# Patient Record
Sex: Female | Born: 1955
Health system: Southern US, Community
[De-identification: ages and names within clinical notes are randomized; demographics above are authoritative.]

## PROBLEM LIST (undated history)

## (undated) DIAGNOSIS — I639 Cerebral infarction, unspecified: Secondary | ICD-10-CM

## (undated) DIAGNOSIS — H3320 Serous retinal detachment, unspecified eye: Secondary | ICD-10-CM

## (undated) DIAGNOSIS — D649 Anemia, unspecified: Secondary | ICD-10-CM

## (undated) DIAGNOSIS — M1990 Unspecified osteoarthritis, unspecified site: Secondary | ICD-10-CM

## (undated) DIAGNOSIS — IMO0002 Reserved for concepts with insufficient information to code with codable children: Secondary | ICD-10-CM

## (undated) DIAGNOSIS — M329 Systemic lupus erythematosus, unspecified: Secondary | ICD-10-CM

## (undated) DIAGNOSIS — E0590 Thyrotoxicosis, unspecified without thyrotoxic crisis or storm: Secondary | ICD-10-CM

## (undated) DIAGNOSIS — J383 Other diseases of vocal cords: Secondary | ICD-10-CM

## (undated) DIAGNOSIS — J189 Pneumonia, unspecified organism: Secondary | ICD-10-CM

## (undated) DIAGNOSIS — R42 Dizziness and giddiness: Secondary | ICD-10-CM

## (undated) DIAGNOSIS — H3321 Serous retinal detachment, right eye: Secondary | ICD-10-CM

## (undated) DIAGNOSIS — I10 Essential (primary) hypertension: Secondary | ICD-10-CM

## (undated) DIAGNOSIS — N200 Calculus of kidney: Secondary | ICD-10-CM

## (undated) DIAGNOSIS — H5440 Blindness, one eye, unspecified eye: Secondary | ICD-10-CM

## (undated) DIAGNOSIS — Z87442 Personal history of urinary calculi: Secondary | ICD-10-CM

## (undated) HISTORY — PX: ABDOMINAL HYSTERECTOMY: SHX81

## (undated) HISTORY — DX: Cerebral infarction, unspecified: I63.9

## (undated) HISTORY — PX: KNEE SURGERY: SHX244

## (undated) HISTORY — PX: BREAST LUMPECTOMY: SHX2

## (undated) HISTORY — PX: BREAST EXCISIONAL BIOPSY: SUR124

## (undated) HISTORY — PX: THYROID SURGERY: SHX805

---

## 1997-11-24 ENCOUNTER — Inpatient Hospital Stay (HOSPITAL_COMMUNITY): Admission: EM | Admit: 1997-11-24 | Discharge: 1997-11-25 | Payer: Self-pay | Admitting: Emergency Medicine

## 1997-11-26 ENCOUNTER — Other Ambulatory Visit: Admission: RE | Admit: 1997-11-26 | Discharge: 1997-11-26 | Payer: Self-pay | Admitting: Internal Medicine

## 1997-11-30 ENCOUNTER — Ambulatory Visit (HOSPITAL_COMMUNITY): Admission: RE | Admit: 1997-11-30 | Discharge: 1997-11-30 | Payer: Self-pay | Admitting: Internal Medicine

## 1997-12-03 ENCOUNTER — Encounter: Payer: Self-pay | Admitting: Internal Medicine

## 1997-12-03 ENCOUNTER — Ambulatory Visit (HOSPITAL_COMMUNITY): Admission: RE | Admit: 1997-12-03 | Discharge: 1997-12-03 | Payer: Self-pay | Admitting: Internal Medicine

## 1998-03-20 ENCOUNTER — Emergency Department (HOSPITAL_COMMUNITY): Admission: EM | Admit: 1998-03-20 | Discharge: 1998-03-20 | Payer: Self-pay | Admitting: Emergency Medicine

## 1998-05-22 ENCOUNTER — Inpatient Hospital Stay (HOSPITAL_COMMUNITY): Admission: RE | Admit: 1998-05-22 | Discharge: 1998-05-25 | Payer: Self-pay | Admitting: Obstetrics and Gynecology

## 1998-09-16 ENCOUNTER — Emergency Department (HOSPITAL_COMMUNITY): Admission: EM | Admit: 1998-09-16 | Discharge: 1998-09-16 | Payer: Self-pay | Admitting: Emergency Medicine

## 1999-02-20 ENCOUNTER — Encounter: Payer: Self-pay | Admitting: Internal Medicine

## 1999-02-20 ENCOUNTER — Encounter: Admission: RE | Admit: 1999-02-20 | Discharge: 1999-02-20 | Payer: Self-pay | Admitting: Internal Medicine

## 1999-03-03 ENCOUNTER — Ambulatory Visit (HOSPITAL_COMMUNITY): Admission: RE | Admit: 1999-03-03 | Discharge: 1999-03-03 | Payer: Self-pay | Admitting: Internal Medicine

## 1999-03-03 ENCOUNTER — Encounter (INDEPENDENT_AMBULATORY_CARE_PROVIDER_SITE_OTHER): Payer: Self-pay | Admitting: Specialist

## 1999-03-03 ENCOUNTER — Encounter: Payer: Self-pay | Admitting: Internal Medicine

## 1999-04-03 ENCOUNTER — Encounter (HOSPITAL_BASED_OUTPATIENT_CLINIC_OR_DEPARTMENT_OTHER): Payer: Self-pay | Admitting: General Surgery

## 1999-04-03 ENCOUNTER — Ambulatory Visit: Admission: RE | Admit: 1999-04-03 | Discharge: 1999-04-03 | Payer: Self-pay | Admitting: General Surgery

## 1999-04-10 ENCOUNTER — Encounter: Payer: Self-pay | Admitting: Internal Medicine

## 1999-04-10 ENCOUNTER — Ambulatory Visit (HOSPITAL_COMMUNITY): Admission: RE | Admit: 1999-04-10 | Discharge: 1999-04-10 | Payer: Self-pay | Admitting: Internal Medicine

## 1999-04-29 ENCOUNTER — Ambulatory Visit (HOSPITAL_COMMUNITY): Admission: RE | Admit: 1999-04-29 | Discharge: 1999-04-29 | Payer: Self-pay | Admitting: Orthopedic Surgery

## 1999-05-19 ENCOUNTER — Encounter: Admission: RE | Admit: 1999-05-19 | Discharge: 1999-07-02 | Payer: Self-pay | Admitting: Orthopedic Surgery

## 1999-06-03 ENCOUNTER — Emergency Department (HOSPITAL_COMMUNITY): Admission: EM | Admit: 1999-06-03 | Discharge: 1999-06-03 | Payer: Self-pay | Admitting: Emergency Medicine

## 1999-06-04 ENCOUNTER — Encounter (HOSPITAL_BASED_OUTPATIENT_CLINIC_OR_DEPARTMENT_OTHER): Payer: Self-pay | Admitting: General Surgery

## 1999-06-04 ENCOUNTER — Encounter: Payer: Self-pay | Admitting: Emergency Medicine

## 1999-06-04 ENCOUNTER — Ambulatory Visit (HOSPITAL_COMMUNITY): Admission: RE | Admit: 1999-06-04 | Discharge: 1999-06-04 | Payer: Self-pay | Admitting: General Surgery

## 1999-06-17 ENCOUNTER — Encounter (INDEPENDENT_AMBULATORY_CARE_PROVIDER_SITE_OTHER): Payer: Self-pay | Admitting: *Deleted

## 1999-06-17 ENCOUNTER — Ambulatory Visit (HOSPITAL_COMMUNITY): Admission: RE | Admit: 1999-06-17 | Discharge: 1999-06-18 | Payer: Self-pay | Admitting: General Surgery

## 2002-01-05 DIAGNOSIS — M329 Systemic lupus erythematosus, unspecified: Secondary | ICD-10-CM | POA: Insufficient documentation

## 2007-11-24 ENCOUNTER — Ambulatory Visit: Payer: Self-pay | Admitting: Nurse Practitioner

## 2007-11-24 DIAGNOSIS — M899 Disorder of bone, unspecified: Secondary | ICD-10-CM | POA: Insufficient documentation

## 2007-11-24 DIAGNOSIS — E210 Primary hyperparathyroidism: Secondary | ICD-10-CM | POA: Insufficient documentation

## 2007-11-24 DIAGNOSIS — M25569 Pain in unspecified knee: Secondary | ICD-10-CM | POA: Insufficient documentation

## 2007-11-24 DIAGNOSIS — M949 Disorder of cartilage, unspecified: Secondary | ICD-10-CM

## 2007-11-24 DIAGNOSIS — M8580 Other specified disorders of bone density and structure, unspecified site: Secondary | ICD-10-CM | POA: Insufficient documentation

## 2007-11-25 ENCOUNTER — Ambulatory Visit (HOSPITAL_COMMUNITY): Admission: RE | Admit: 2007-11-25 | Discharge: 2007-11-25 | Payer: Self-pay | Admitting: Nurse Practitioner

## 2007-12-05 ENCOUNTER — Ambulatory Visit: Payer: Self-pay | Admitting: *Deleted

## 2007-12-13 ENCOUNTER — Encounter (INDEPENDENT_AMBULATORY_CARE_PROVIDER_SITE_OTHER): Payer: Self-pay | Admitting: Nurse Practitioner

## 2007-12-19 ENCOUNTER — Ambulatory Visit: Payer: Self-pay | Admitting: Nurse Practitioner

## 2007-12-19 DIAGNOSIS — M1710 Unilateral primary osteoarthritis, unspecified knee: Secondary | ICD-10-CM

## 2007-12-19 DIAGNOSIS — M1711 Unilateral primary osteoarthritis, right knee: Secondary | ICD-10-CM | POA: Insufficient documentation

## 2007-12-20 ENCOUNTER — Encounter (INDEPENDENT_AMBULATORY_CARE_PROVIDER_SITE_OTHER): Payer: Self-pay | Admitting: Nurse Practitioner

## 2007-12-20 LAB — CONVERTED CEMR LAB
ALT: 12 units/L (ref 0–35)
ANA Titer 1: 1:40 {titer} — ABNORMAL HIGH
AST: 16 units/L (ref 0–37)
Albumin: 4.6 g/dL (ref 3.5–5.2)
Alkaline Phosphatase: 237 units/L — ABNORMAL HIGH (ref 39–117)
Anti Nuclear Antibody(ANA): POSITIVE — AB
BUN: 13 mg/dL (ref 6–23)
Basophils Absolute: 0 10*3/uL (ref 0.0–0.1)
Basophils Relative: 0 % (ref 0–1)
CO2: 25 meq/L (ref 19–32)
CRP: 0.1 mg/dL (ref <0.6)
Calcium Ionized: 1.74 mmol/L — ABNORMAL HIGH (ref 1.12–1.32)
Calcium, Total (PTH): 12.2 mg/dL — ABNORMAL HIGH (ref 8.4–10.5)
Calcium: 12.2 mg/dL — ABNORMAL HIGH (ref 8.4–10.5)
Chloride: 106 meq/L (ref 96–112)
Cholesterol: 170 mg/dL (ref 0–200)
Creatinine, Ser: 0.76 mg/dL (ref 0.40–1.20)
Eosinophils Absolute: 0.1 10*3/uL (ref 0.0–0.7)
Eosinophils Relative: 1 % (ref 0–5)
Glucose, Bld: 85 mg/dL (ref 70–99)
HCT: 43.8 % (ref 36.0–46.0)
HDL: 40 mg/dL (ref >39)
Hemoglobin: 14 g/dL (ref 12.0–15.0)
LDL Cholesterol: 111 mg/dL — ABNORMAL HIGH (ref 0–99)
Lymphocytes Relative: 55 % — ABNORMAL HIGH (ref 12–46)
Lymphs Abs: 2 10*3/uL (ref 0.7–4.0)
MCHC: 32 g/dL (ref 30.0–36.0)
MCV: 91.3 fL (ref 78.0–100.0)
Monocytes Absolute: 0.4 10*3/uL (ref 0.1–1.0)
Monocytes Relative: 11 % (ref 3–12)
Neutro Abs: 1.2 10*3/uL — ABNORMAL LOW (ref 1.7–7.7)
Neutrophils Relative %: 33 % — ABNORMAL LOW (ref 43–77)
Platelets: 218 10*3/uL (ref 150–400)
Potassium: 4.4 meq/L (ref 3.5–5.3)
RBC: 4.8 M/uL (ref 3.87–5.11)
RDW: 13.8 % (ref 11.5–15.5)
Rhuematoid fact SerPl-aCnc: <20 intl units/mL (ref 0–20)
Sed Rate: 19 mm/hr (ref 0–22)
Sodium: 138 meq/L (ref 135–145)
TSH: 1.483 microintl units/mL (ref 0.350–4.50)
Total Bilirubin: 0.9 mg/dL (ref 0.3–1.2)
Total CHOL/HDL Ratio: 4.3
Total Protein: 8.8 g/dL — ABNORMAL HIGH (ref 6.0–8.3)
Triglycerides: 96 mg/dL (ref <150)
VLDL: 19 mg/dL (ref 0–40)
WBC: 3.7 10*3/uL — ABNORMAL LOW (ref 4.0–10.5)

## 2007-12-22 ENCOUNTER — Ambulatory Visit (HOSPITAL_COMMUNITY): Admission: RE | Admit: 2007-12-22 | Discharge: 2007-12-22 | Payer: Self-pay | Admitting: Family Medicine

## 2008-01-26 ENCOUNTER — Ambulatory Visit: Payer: Self-pay | Admitting: Nurse Practitioner

## 2008-01-26 DIAGNOSIS — IMO0002 Reserved for concepts with insufficient information to code with codable children: Secondary | ICD-10-CM | POA: Insufficient documentation

## 2008-01-31 ENCOUNTER — Telehealth (INDEPENDENT_AMBULATORY_CARE_PROVIDER_SITE_OTHER): Payer: Self-pay | Admitting: Nurse Practitioner

## 2008-01-31 ENCOUNTER — Telehealth (INDEPENDENT_AMBULATORY_CARE_PROVIDER_SITE_OTHER): Payer: Self-pay | Admitting: *Deleted

## 2008-01-31 ENCOUNTER — Encounter (INDEPENDENT_AMBULATORY_CARE_PROVIDER_SITE_OTHER): Payer: Self-pay | Admitting: Nurse Practitioner

## 2008-01-31 LAB — CONVERTED CEMR LAB
Calcium, Total (PTH): 11.5 mg/dL — ABNORMAL HIGH (ref 8.4–10.5)
PTH: 96.4 pg/mL — ABNORMAL HIGH (ref 14.0–72.0)
Phosphorus: 2.5 mg/dL (ref 2.3–4.6)

## 2008-02-10 ENCOUNTER — Encounter (INDEPENDENT_AMBULATORY_CARE_PROVIDER_SITE_OTHER): Payer: Self-pay | Admitting: Nurse Practitioner

## 2008-02-17 ENCOUNTER — Encounter (INDEPENDENT_AMBULATORY_CARE_PROVIDER_SITE_OTHER): Payer: Self-pay | Admitting: Nurse Practitioner

## 2008-03-01 ENCOUNTER — Encounter (INDEPENDENT_AMBULATORY_CARE_PROVIDER_SITE_OTHER): Payer: Self-pay | Admitting: Nurse Practitioner

## 2008-05-11 ENCOUNTER — Encounter (INDEPENDENT_AMBULATORY_CARE_PROVIDER_SITE_OTHER): Payer: Self-pay | Admitting: Nurse Practitioner

## 2008-07-02 ENCOUNTER — Emergency Department (HOSPITAL_COMMUNITY): Admission: EM | Admit: 2008-07-02 | Discharge: 2008-07-02 | Payer: Self-pay | Admitting: Family Medicine

## 2008-09-28 ENCOUNTER — Emergency Department (HOSPITAL_COMMUNITY): Admission: EM | Admit: 2008-09-28 | Discharge: 2008-09-28 | Payer: Self-pay | Admitting: Emergency Medicine

## 2008-10-07 ENCOUNTER — Emergency Department: Payer: Self-pay | Admitting: Emergency Medicine

## 2008-10-15 ENCOUNTER — Ambulatory Visit: Payer: Self-pay | Admitting: Nurse Practitioner

## 2008-10-16 ENCOUNTER — Ambulatory Visit (HOSPITAL_COMMUNITY): Admission: RE | Admit: 2008-10-16 | Discharge: 2008-10-16 | Payer: Self-pay | Admitting: Internal Medicine

## 2008-10-17 ENCOUNTER — Encounter (INDEPENDENT_AMBULATORY_CARE_PROVIDER_SITE_OTHER): Payer: Self-pay | Admitting: Nurse Practitioner

## 2008-11-27 ENCOUNTER — Telehealth (INDEPENDENT_AMBULATORY_CARE_PROVIDER_SITE_OTHER): Payer: Self-pay | Admitting: Nurse Practitioner

## 2008-11-27 LAB — CONVERTED CEMR LAB
ALT: 16 units/L (ref 0–35)
AST: 19 units/L (ref 0–37)
Albumin: 4.4 g/dL (ref 3.5–5.2)
Alkaline Phosphatase: 220 units/L — ABNORMAL HIGH (ref 39–117)
BUN: 13 mg/dL (ref 6–23)
Basophils Absolute: 0 10*3/uL (ref 0.0–0.1)
Basophils Relative: 0 % (ref 0–1)
CO2: 24 meq/L (ref 19–32)
Calcium Ionized: 1.71 mmol/L — ABNORMAL HIGH (ref 1.12–1.32)
Calcium, Total (PTH): 11.7 mg/dL — ABNORMAL HIGH (ref 8.4–10.5)
Calcium: 11.7 mg/dL — ABNORMAL HIGH (ref 8.4–10.5)
Chloride: 110 meq/L (ref 96–112)
Creatinine, Ser: 0.67 mg/dL (ref 0.40–1.20)
Eosinophils Absolute: 0.1 10*3/uL (ref 0.0–0.7)
Eosinophils Relative: 3 % (ref 0–5)
Glucose, Bld: 83 mg/dL (ref 70–99)
HCT: 40.9 % (ref 36.0–46.0)
Hemoglobin: 12.8 g/dL (ref 12.0–15.0)
Lymphocytes Relative: 48 % — ABNORMAL HIGH (ref 12–46)
Lymphs Abs: 1.5 10*3/uL (ref 0.7–4.0)
MCHC: 31.3 g/dL (ref 30.0–36.0)
MCV: 90.7 fL (ref 78.0–100.0)
Monocytes Absolute: 0.4 10*3/uL (ref 0.1–1.0)
Monocytes Relative: 12 % (ref 3–12)
Neutro Abs: 1.2 10*3/uL — ABNORMAL LOW (ref 1.7–7.7)
Neutrophils Relative %: 37 % — ABNORMAL LOW (ref 43–77)
PTH: 140.4 pg/mL — ABNORMAL HIGH (ref 14.0–72.0)
Phosphorus: 2.1 mg/dL — ABNORMAL LOW (ref 2.3–4.6)
Platelets: 201 10*3/uL (ref 150–400)
Potassium: 4.6 meq/L (ref 3.5–5.3)
RBC: 4.51 M/uL (ref 3.87–5.11)
RDW: 14.3 % (ref 11.5–15.5)
Sed Rate: 6 mm/hr (ref 0–22)
Sodium: 143 meq/L (ref 135–145)
Total Bilirubin: 0.6 mg/dL (ref 0.3–1.2)
Total Protein: 8 g/dL (ref 6.0–8.3)
Vit D, 25-Hydroxy: 15 ng/mL — ABNORMAL LOW (ref 30–89)
WBC: 3.1 10*3/uL — ABNORMAL LOW (ref 4.0–10.5)

## 2008-12-21 ENCOUNTER — Encounter (INDEPENDENT_AMBULATORY_CARE_PROVIDER_SITE_OTHER): Payer: Self-pay | Admitting: Nurse Practitioner

## 2009-01-28 ENCOUNTER — Encounter (INDEPENDENT_AMBULATORY_CARE_PROVIDER_SITE_OTHER): Payer: Self-pay | Admitting: Nurse Practitioner

## 2009-01-29 ENCOUNTER — Telehealth (INDEPENDENT_AMBULATORY_CARE_PROVIDER_SITE_OTHER): Payer: Self-pay | Admitting: Nurse Practitioner

## 2009-03-14 ENCOUNTER — Encounter (INDEPENDENT_AMBULATORY_CARE_PROVIDER_SITE_OTHER): Payer: Self-pay | Admitting: Nurse Practitioner

## 2009-05-20 ENCOUNTER — Encounter (INDEPENDENT_AMBULATORY_CARE_PROVIDER_SITE_OTHER): Payer: Self-pay | Admitting: Nurse Practitioner

## 2009-06-07 ENCOUNTER — Encounter (INDEPENDENT_AMBULATORY_CARE_PROVIDER_SITE_OTHER): Payer: Self-pay | Admitting: Nurse Practitioner

## 2009-08-21 ENCOUNTER — Inpatient Hospital Stay (HOSPITAL_COMMUNITY): Admission: RE | Admit: 2009-08-21 | Discharge: 2009-08-24 | Payer: Medicare HMO | Admitting: Orthopedic Surgery

## 2009-08-21 DIAGNOSIS — I639 Cerebral infarction, unspecified: Secondary | ICD-10-CM

## 2009-09-10 ENCOUNTER — Ambulatory Visit: Payer: Self-pay | Admitting: Internal Medicine

## 2009-09-26 ENCOUNTER — Encounter: Payer: Self-pay | Admitting: Orthopedic Surgery

## 2009-10-05 ENCOUNTER — Encounter: Payer: Self-pay | Admitting: Orthopedic Surgery

## 2009-10-08 ENCOUNTER — Ambulatory Visit: Payer: Self-pay | Admitting: Internal Medicine

## 2010-02-04 NOTE — Letter (Signed)
Summary: [facility]//EVALLUATION  [facility]//EVALLUATION   Imported By: Arta Bruce 04/15/2009 14:12:19  _____________________________________________________________________  External Attachment:    Type:   Image     Comment:   External Document

## 2010-02-04 NOTE — Letter (Signed)
Summary: REQUESTING RECORDS FROM [facility]  REQUESTING RECORDS FROM [facility]   Imported By: Arta Bruce 12/13/2007 09:28:25  _____________________________________________________________________  External Attachment:    Type:   Image     Comment:   External Document

## 2010-02-04 NOTE — Letter (Signed)
Summary: [facility]  [facility]   Imported By: Arta Bruce 06/12/2009 10:13:50  _____________________________________________________________________  External Attachment:    Type:   Image     Comment:   External Document

## 2010-02-04 NOTE — Letter (Signed)
Summary: PATIENT INFORMATION  & HISTROY SHEET  PATIENT INFORMATION  & HISTROY SHEET   Imported By: Arta Bruce 11/30/2007 14:13:40  _____________________________________________________________________  External Attachment:    Type:   Image     Comment:   External Document

## 2010-02-04 NOTE — Progress Notes (Signed)
Summary: Lab addition  ---- Converted from flag ---- ---- 01/30/2008 5:25 PM, Lehman Prom FNP wrote: please see if phosphorous can be added to blood in the lab ------------------------------  done..... [name] I

## 2010-02-04 NOTE — Letter (Signed)
Summary: Work Excuse  [facility]  [address] [place], [place] [address]   Phone: [phone]  Fax: [phone]    Today's Date: October 15, 2008  Name of Patient: Elaine Allen  The above named patient had a medical visit today   Please take this into consideration when reviewing the time away from work  Pt was seen today in office for evaluation of left knee injury sustained during a fall.  She does have a left meniscus tear but is able to perform her job duties.   Sincerely yours,   Lehman Prom FNP [facility]

## 2010-02-04 NOTE — Assessment & Plan Note (Signed)
Summary: Right knee pain   Vital Signs:  Patient Profile:   55 Years Old Female Height:     63.50 inches Weight:      192 pounds BMI:     33.60 BSA:     1.91 Temp:     98.3 degrees F oral Pulse rate:   80 / minute Pulse rhythm:   regular Resp:     20 per minute BP sitting:   120 / 80  (left arm) Cuff size:   regular  Pt. in pain?   yes    Location:   both knees  Vitals Entered By: Levon Hedger (January 26, 2008 9:40 AM)              Is Patient Diabetic? No  Does patient need assistance? Ambulation Normal     Chief Complaint:  MRI results/ both knee pain.  History of Present Illness:  Pt into the office with complaints of bil knee pain. Left knee swollen on yesturday. Right knee pain is getting intense.  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 It does not matter what she does and the pain gets so intense. Recurrent pain and sweeling in the right knee.    Updated Prior Medication List: FELDENE 20 MG CAPS (PIROXICAM) 1 capsule by mouth daily as needed for joints FLEXERIL 10 MG TABS (CYCLOBENZAPRINE HCL) 1 tablet by mouth nightly for muscles  Current Allergies (reviewed today): ! PLAQUENIL    Risk Factors: Tobacco use:  never Drug use:  no Alcohol use:  no Exercise:  no Seatbelt use:  100 % Sun Exposure:  occasionally  Family History Risk Factors:    Family History of MI in females < 92 years old:  yes    Family History of MI in males < 31 years old:  no   Review of Systems  MS      Complains of joint pain.      right knee pain    Physical Exam  General:     alert.   Head:     bald hair Eyes:     glasses Lungs:     normal breath sounds.   Heart:     normal rate and regular rhythm.   Genitalia:     ambulating with a cane Msk:     halux valgus Neurologic:     alert & oriented X3.      Impression & Recommendations:  Problem # 1:  MENISCUS TEAR, RIGHT (ICD-836.2) Will refer to ortho - [facility] Orders: Orthopedic Referral (Ortho)   Problem # 2:  PRIMARY HYPERPARATHYROIDISM (ICD-252.01) need to check parathyroid hormone level Orders: T-Parathyroid Hormone, Intact ([id number])   Problem # 3:  HYPERCALCEMIA (ICD-275.42) noted on last visit pt has a history.  Orders: T-Parathyroid Hormone, Intact [phone])   Complete Medication List: 1)  Feldene 20 Mg Caps (Piroxicam) .Marland Kitchen.. 1 capsule by mouth daily as needed for joints 2)  Flexeril 10 Mg Tabs (Cyclobenzaprine hcl) .Marland Kitchen.. 1 tablet by mouth nightly for muscles   Patient Instructions: 1)  This office will refer you to [facility] Fellows program.  If accepted you will be notified of the time and date of appointment. 2)  You will be notified of labs.

## 2010-02-04 NOTE — Letter (Signed)
Summary: BEHAVIORAL GUIDELINE  BEHAVIORAL GUIDELINE   Imported By: Arta Bruce 11/30/2007 14:15:38  _____________________________________________________________________  External Attachment:    Type:   Image     Comment:   External Document

## 2010-02-04 NOTE — Letter (Signed)
Summary: [facility]//PRIMARY HYPERPARATHYROIDISM  [facility]//PRIMARY HYPERPARATHYROIDISM   Imported By: Arta Bruce 04/03/2009 10:34:07  _____________________________________________________________________  External Attachment:    Type:   Image     Comment:   External Document

## 2010-02-04 NOTE — Letter (Signed)
Summary: [facility]ERRAL/[facility]/APP. DATE & TIME  REFERRAL/[facility]/APP. DATE & TIME   Imported By: Arta Bruce 02/17/2008 15:32:27  _____________________________________________________________________  External Attachment:    Type:   Image     Comment:   External Document

## 2010-02-04 NOTE — Assessment & Plan Note (Signed)
Summary: Left Meniscus Tear   Vital Signs:  Patient profile:   55 year old female Weight:      189.8 pounds BMI:     33.21 BSA:     1.90 Temp:     98.3 degrees F oral Pulse rate:   96 / minute Pulse rhythm:   regular Resp:     20 per minute BP sitting:   125 / 87  (left arm) Cuff size:   regular  Vitals Entered By: Levon Hedger (October 15, 2008 12:14 PM) CC: follow-up visit Lupus....pt fell and tore a ligament in leg knee and went to [facility] Is Patient Diabetic? No Pain Assessment Patient in pain? no       Does patient need assistance? Functional Status Self care Ambulation Normal Comments pt states she was given Ultram for pain   CC:  follow-up visit Lupus....pt fell and tore a ligament in leg knee and went to [facility].  History of Present Illness:  Pt into the office for f/u.  Left knee - started about 1 week ago with pain.  She went to the ER and MRI shows injury to the left (menicus tear)  s/p arthoscopic to left knee in 2007 Recent repair at [facility] for right knee torn meniscus.  Fell at work on 09/28/2008 and at the time she c/o left wrist pain at the time of incident.  [employer] is where pt is employed.  Hyperparathyroidism - known since pt established in ths office but no f/u.    Allergies (verified): 1)  ! Plaquenil  Review of Systems General:  Denies fever. CV:  Denies chest pain or discomfort. Resp:  Denies cough. GI:  Denies abdominal pain, nausea, and vomiting. MS:  Complains of muscle weakness; left knee.  Physical Exam  General:  alert.   Head:  bald head Eyes:  glasses pupils reactive to light.   Mouth:  edentulous Lungs:  normal breath sounds.   Heart:  normal rate and regular rhythm.   Skin:  color normal.   Psych:  poor gait   Knee Exam  Knee Exam:    Left:    Inspection:  Abnormal    Palpation:  Abnormal       Location:  medial collateral    Stability:  stable    Tenderness:  medial  collateral    Swelling:  no    Erythema:  no   Impression & Recommendations:  Problem # 1:  MENISCUS TEAR, LEFT (ICD-836.2) noted on MRI done at [facility]  Problem # 2:  PRIMARY HYPERPARATHYROIDISM (ICD-252.01) will refer to endocrine for assessement and treatment Orders: T-Comprehensive Metabolic Panel ([id number]) T-CBC w/Diff (09811-91478) Endocrinology Referral (Endocrine)  Problem # 3:  UNSPECIFIED BREAST SCREENING (ICD-V76.10) self breast exam placcard given will schedule mammogram Orders: Mammogram (Screening) (Mammo)  Complete Medication List: 1)  Feldene 20 Mg Caps (Piroxicam) .Marland Kitchen.. 1 capsule by mouth daily as needed for joints 2)  Flexeril 10 Mg Tabs (Cyclobenzaprine hcl) .Marland Kitchen.. 1 tablet by mouth nightly for muscles  Other Orders: TLB-Sedimentation Rate (ESR) (85652-[id number]) T-Parathyroid Hormone, Intact w/ Calcium ([id number]) T-Calcium, Ionized ([id number]) T-Vitamin D (25-Hydroxy) (28413-24401) T-Phosphorus (02725-36644)  Patient Instructions: 1)  You will be notified of your lab results. 2)  You will be scheduled for a mammogram 3)  Left knee - medical meniscus tear.  You will need to wear knee support especially while at work.

## 2010-02-04 NOTE — Progress Notes (Signed)
Summary: endocrinology apt date and time  Phone Note Outgoing Call   Details for Reason: patient has appointment with [facility] 12/06/08 at 2pm. Volunteer left vm on 11/15/08 for patient that will need 184.00 payment at time of visit. Also advised of apt time and told to give 24 hour notice if need to cancel or reschedule. 2nd call attempt, left vm to return call, please advise of apt time and date when returns call. Number to [facility] endo is [phone] if patient needs to reschedule or cancel  Initial call taken by: Blondell Reveal RN,  November 27, 2008 3:15 PM  Follow-up for Phone Call        patient returned call, she will not be able to make this appointment secondary to the financial requirements. Advised that I could speak with our referral coordinator at [facility] to see if she can refer to [facility] in [place], if patient can do lower payment plan and can get to [place]. She is agreeable and would like to pursue. Call to [facility], which is where patient was scheduled- Dr Everardo All, advised that patient would need to cancel appointment secondary to financial constraints. Have requested referral coordinator review case for possible referral to [facility] Follow-up by: Blondell Reveal RN,  November 27, 2008 3:53 PM  Additional Follow-up for Phone Call Additional follow up Details #1::        Yes, please refer to [facility]. She very much needs this appt Additional Follow-up by: Lehman Prom FNP,  November 27, 2008 4:04 PM    Additional Follow-up for Phone Call Additional follow up Details #2::    Appt is sched @ [facility] Endocrinology for 03-14-09 with Dr.Springs @ 1:10pm..Lt message for Pt to call back.A New Pt packaged will be mailed to Pt's home.  Additional Follow-up for Phone Call Additional follow up Details #3:: Details for Additional Follow-up Action Taken: noted Additional Follow-up by: Lehman Prom FNP,  January 28, 2009 1:13 PM

## 2010-02-04 NOTE — Assessment & Plan Note (Signed)
Summary: Right knee pain   Vital Signs:  Patient Profile:   55 Years Old Female Height:     63.50 inches Weight:      188 pounds BMI:     32.90 BSA:     1.90 Temp:     978 degrees F oral Pulse rate:   76 / minute Pulse rhythm:   regular Resp:     20 per minute BP sitting:   120 / 80  (left arm) Cuff size:   large  Pt. in pain?   no  Vitals Entered By: Levon Hedger (December 19, 2007 11:46 AM)              Is Patient Diabetic? No  Does patient need assistance? Ambulation Normal     Chief Complaint:  xray results 3 week follow-up.  History of Present Illness:  Pt the office for f/u on right knee. still with pain. x-ray of knee done. Pt is still having some burning on the medial aspect. reports an injury on labor day weekend Pt is already taking flexeril and feldene. She does not take the feldene daily.  (see previous visit for full H&P)  Pt is fasting today for labs Hx of lupus and hyperparathyroidism.  awaiting records from previous provider to review.    Prior Medications Reviewed Using: Medication Bottles  Updated Prior Medication List: FELDENE 20 MG CAPS (PIROXICAM) 1 capsule by mouth daily as needed for joints FLEXERIL 10 MG TABS (CYCLOBENZAPRINE HCL) 1 tablet by mouth nightly for muscles  Current Allergies (reviewed today): ! PLAQUENIL    Risk Factors: Tobacco use:  never Drug use:  no Alcohol use:  no Exercise:  no Seatbelt use:  100 % Sun Exposure:  occasionally  Family History Risk Factors:    Family History of MI in females < 100 years old:  yes    Family History of MI in males < 36 years old:  no   Review of Systems  MS      Complains of joint pain.      right knee   Physical Exam  General:     alert and overweight-appearing.   Head:     normocephalic.   Mouth:     mouth - edentulous Lungs:     normal breath sounds.   Heart:     normal rate and regular rhythm.   Abdomen:     soft, non-tender, and normal bowel  sounds.   Msk:     halux valgus Neurologic:     alert & oriented X3.     Knee Exam  General:    obese.    Knee Exam:    Right:    Inspection:  Abnormal    Palpation:  Abnormal       Location:  medial collateral    Stability:  stable    Tenderness:  medial collateral    Swelling:  diffuse    Erythema:  no    Impression & Recommendations:  Problem # 1:  KNEE PAIN, RIGHT (ICD-719.46) will order MRI X-ray results reviewed with pt Her updated medication list for this problem includes:    Feldene 20 Mg Caps (Piroxicam) .Marland Kitchen... 1 capsule by mouth daily as needed for joints    Flexeril 10 Mg Tabs (Cyclobenzaprine hcl) .Marland Kitchen... 1 tablet by mouth nightly for muscles  Orders: MRI with Contrast (MRI w/Contrast)   Problem # 2:  SLE (ICD-710.0) awaiting previous office notes will give flu vaccine today Her updated medication  list for this problem includes:    Feldene 20 Mg Caps (Piroxicam) .Marland Kitchen... 1 capsule by mouth daily as needed for joints  Orders: T-General Health Panel (CBCD, CMP, TSH) ([address]) T-Sed Rate (Automated) [phone]) T-Rheumatoid Factor [phone]) T-C-Reactive Protein [phone]) T-Antinuclear Antib (ANA) [phone])   Complete Medication List: 1)  Feldene 20 Mg Caps (Piroxicam) .Marland Kitchen.. 1 capsule by mouth daily as needed for joints 2)  Flexeril 10 Mg Tabs (Cyclobenzaprine hcl) .Marland Kitchen.. 1 tablet by mouth nightly for muscles  Other Orders: T-Parathyroid Hormone, Intact w/ Calcium ([id number]) T-Calcium, Ionized ([id number]) T-Lipid Profile ([id number])   Patient Instructions: 1)  You will get labs and will be informed of the results. 2)  You need to take the feldene DAILY. 3)  You will be scheduled for MRI. 4)  Schedule follow up 2 weeks after MRI   ]  X-ray  Procedure date:  11/25/2007  Findings:      right knee - mild to moderate osteoarthritis involving the medial and lateral compartment  Appended Document: Right knee pain         Current Allergies: ! PLAQUENIL        Complete Medication List: 1)  Feldene 20 Mg Caps (Piroxicam) .Marland Kitchen.. 1 capsule by mouth daily as needed for joints 2)  Flexeril 10 Mg Tabs (Cyclobenzaprine hcl) .Marland Kitchen.. 1 tablet by mouth nightly for muscles    ]  Influenza Vaccine    Vaccine Type: Fluvax 3+    Site: right deltoid    Mfr: Sanofi Pasteur    Dose: 0.5 ml    Route: IM    Given by: Levon Hedger    Exp. Date: 06/31/2010    Lot #: [id number]    VIS given: 07/29/06 version given December 19, 2007.  Flu Vaccine Consent Questions    Do you have a history of severe allergic reactions to this vaccine? no    Any prior history of allergic reactions to egg and/or gelatin? no    Do you have a sensitivity to the preservative Thimersol? no    Do you have a past history of Guillan-Barre Syndrome? no    Do you currently have an acute febrile illness? no    Have you ever had a severe reaction to latex? no    Vaccine information given and explained to patient? yes    Are you currently pregnant? no    ndc [phone]

## 2010-02-04 NOTE — Letter (Signed)
Summary: *HSN Results Follow up  [facility]  [address] [place], [place] [address]   Phone: [phone]  Fax: [phone]      01/28/2009   Elaine Allen [address] [place], [place]  [address]   Dear  Elaine Allen,                            ____S.Drinkard,FNP   ____D. Gore,FNP       ____B. McPherson,MD   ____V. Rankins,MD    ____E. Mulberry,MD    __X__N. Daphine Deutscher, FNP  ____D. Reche Dixon, MD    ____K. Philipp Deputy, MD    ____Other     This letter is to inform you that you have been referred to [facility].  Your appointment is March 10th, 2011 at 1:10PM with Dr. Peterson Ao.  Please be sure you keep this appointment to check your parathyroid abnormality.  This office has been trying to get you an appointment with the specialist for a long time so it is very important.  You should be receiving a new patient packet in the mail to complete.  Appt is sched @ [facility] for 03-14-09 with Dr.Springs @ 1:10pm       _________________________________________________________ If you have any questions, please contact our office [phone]                    Sincerely,    Lehman Prom FNP [facility]

## 2010-02-04 NOTE — Letter (Signed)
Summary: KNEE IMMOBILIZATION  KNEE IMMOBILIZATION   Imported By: Arta Bruce 10/17/2008 09:14:21  _____________________________________________________________________  External Attachment:    Type:   Image     Comment:   External Document

## 2010-02-04 NOTE — Miscellaneous (Signed)
Summary: Hx - Right Meniscal repair  Clinical Lists Changes  Problems: Changed problem from MENISCUS TEAR, RIGHT (ICD-836.2) - seen on MRI to History of  MENISCUS TEAR, RIGHT (ICD-836.2) - seen on MRI, repaired at [facility] on 05/08/2008

## 2010-02-04 NOTE — Progress Notes (Signed)
Summary: Unable to add Vitamin D lab  ---- Converted from flag ---- ---- 01/31/2008 9:10 AM, Lehman Prom FNP wrote: see if vitamin D can be added to blood in lab ------------------------------   Phone Note Outgoing Call   Call placed by: [name] CMA,  January 31, 2008 1:16 PM Summary of Call: contacted lab could not add VItamin D to blood work Initial call taken by: [name] CMA,  January 31, 2008 1:16 PM

## 2010-02-04 NOTE — Progress Notes (Signed)
Summary: Endocinology Referral Update  Phone Note From Other Clinic   Caller: Referral Coordinator Summary of Call: Pt aware of appt on  03-14-09 @ 1:10 with Dr.Springs @ [facility]ocrinology Initial call taken by: Candi Leash,  January 29, 2009 12:16 PM  Follow-up for Phone Call        noted Follow-up by: Lehman Prom FNP,  January 29, 2009 12:19 PM

## 2010-02-04 NOTE — Letter (Signed)
Summary: [facility]/APPT DATE & TIME  [facility]/APPT DATE & TIME   Imported By: Arta Bruce 04/03/2008 11:51:22  _____________________________________________________________________  External Attachment:    Type:   Image     Comment:   External Document

## 2010-02-04 NOTE — Letter (Signed)
Summary: MAILED REQUESTED RECORDS TO [facility]  MAILED REQUESTED RECORDS TO [facility]   Imported By: Arta Bruce 06/07/2009 12:57:04  _____________________________________________________________________  External Attachment:    Type:   Image     Comment:   External Document

## 2010-02-04 NOTE — Assessment & Plan Note (Signed)
Summary: NEW - Establish Care   Vital Signs:  Patient Profile:   55 Years Old Female Height:     63.50 inches Weight:      193 pounds BMI:     33.77 BSA:     1.92 Temp:     97.0 degrees F oral Pulse rate:   76 / minute Pulse rhythm:   regular Resp:     20 per minute BP sitting:   120 / 90  (left arm) Cuff size:   regular  Pt. in pain?   yes    Location:   right knee    Intensity:   10  Vitals Entered By: Levon Hedger (November 24, 2007 3:30 PM)  Menstrual History: LMP - Character: partial hysterctomy              Is Patient Diabetic? No  Does patient need assistance? Ambulation Normal   Last PAP Date 2004   Chief Complaint:  new establish/ right knee pain with swelling.  History of Present Illness: Pt into the office to establish care. Pt moved here from [place]. Pt's son was sent [place] Pt has family here so she decided to move back here while her son was away. Pt had routine f/u with PCP, and hematolgist.  Hx - SLE and hyperparathyroidism Reports surgery x 2 for hyperparathyroidism. She was to have a 3rd surgery. States that her calcium level remained elevated and they wanted to do an addtion surgery to remove part of her thyroid. No current thyroid medications  Left knee - laproscopic surgery in 2000.  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   Right  knee - she has episodes when her right "gives away".  She fell this past memorial day and fell to the ground.  Since then she has been experiencing buring to the medial aspect of the knee.  She wears flat shoes.  When going down the stairs the right knee feels like "jelly" she does have a cane that she does not use all the time.  SLE - taking feldene and muscle relaxer for joints.  she is out of the medication.   Allegic reaction to plaquenil. She did use prednisone from time to time but only if platelet level was low. 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  She would like to return to work but is unable to due to pain in right knee.      Updated Prior Medication List: No Medications Current Allergies (reviewed today): ! PLAQUENIL  Past Surgical History:    parathyroid surgery x 2    laproscopic surgery 2000   Family History:    diabetic - mother    hepatic cancer - mother (deceased)    prostate cancer - father (deceased)    brain tumor - uncle paternal     colon cancer - paternal uncles x 2  Social History:    Occupation: CNA    Never Smoked    Alcohol use-no    Drug use-no   Risk Factors:  Tobacco use:  never Drug use:  no Alcohol use:  no Exercise:  no Seatbelt use:  100 % Sun Exposure:  occasionally  Family History Risk Factors:    Family History of MI in females < 9 years old:  yes    Family History of MI in males < 89 years old:  no   Review of Systems  MS      Complains of joint pain.      Right knee pain   Physical Exam  General:     alert and overweight-appearing.   Head:     short hair Eyes:     glasses Ears:     ear piercing(s) noted.   Mouth:     edenulous Lungs:     normal breath sounds.   Heart:     normal rate and regular rhythm.     Knee Exam  General:    average weight.    Skin:    bruising-R:   Knee Exam:    Right:    Inspection:  Abnormal    Palpation:  Abnormal       Location:  medial capsule    Stability:  stable    Tenderness:  medial collateral    Swelling:  diffuse    Erythema:  no    decreased ROM    Impression & Recommendations:  Problem # 1:  KNEE PAIN, RIGHT (ICD-719.46) Will send for x-ray of right knee Orders: Radiology other (Radiology Other)  Her updated medication list for this problem includes:    Feldene 20 Mg Caps (Piroxicam) .Marland Kitchen... 1 capsule by mouth daily as needed for joints    Flexeril 10 Mg Tabs (Cyclobenzaprine hcl) .Marland Kitchen... 1 tablet by mouth nightly for  muscles   Problem # 2:  SLE (ICD-710.0)  Her updated medication list for this problem includes:    Feldene 20 Mg Caps (Piroxicam) .Marland Kitchen... 1 capsule by mouth daily as needed for joints   Problem # 3:  PRIMARY HYPERPARATHYROIDISM (ICD-252.01) will get records from previous provider to review previous treatment.  Problem # 4:  HYPERCALCEMIA (ICD-275.42)  Complete Medication List: 1)  Feldene 20 Mg Caps (Piroxicam) .Marland Kitchen.. 1 capsule by mouth daily as needed for joints 2)  Flexeril 10 Mg Tabs (Cyclobenzaprine hcl) .Marland Kitchen.. 1 tablet by mouth nightly for muscles   Patient Instructions: 1)  Sign a release to get the records from previous providers 2)  [phone] - Festus Aloe  3)  AND 4)  Adrienne Mocha (may need to call back with number so that this office will know where to fax request for records) 5)  I will need to review your old records to see what the next step for follow up is in regards to parathyroid 6)  You will need to get x-rays of right knee. 7)  Follow up  here in 3 weeks.  Hopefully I will have your previous records by then and can see what next follow up will be.   Prescriptions: FLEXERIL 10 MG TABS (CYCLOBENZAPRINE HCL) 1 tablet by mouth nightly for muscles  #30 x 0   Entered and Authorized by:   Lehman Prom FNP   Signed by:   Lehman Prom FNP on 11/24/2007   Method used:   Print then Give to Patient   RxID:   [id number] FELDENE 20 MG CAPS (PIROXICAM) 1 capsule by mouth daily as needed for joints  #30 x 1   Entered and Authorized by:   Lehman Prom FNP   Signed by:   Lehman Prom FNP on 11/24/2007   Method used:   Print then Give to Patient   RxID:   [id number]  ]

## 2010-02-04 NOTE — Letter (Signed)
[facility]   Imported By: Arta Bruce 05/20/2009 15:26:34  _____________________________________________________________________  External Attachment:    Type:   Image     Comment:   External Document

## 2010-02-07 NOTE — Letter (Signed)
Summary: [facility]FERRAL/[facility]  REFERRAL/[facility]   Imported By: Arta Bruce 03/01/2008 10:36:56  _____________________________________________________________________  External Attachment:    Type:   Image     Comment:   External Document

## 2010-03-21 LAB — HEPATIC FUNCTION PANEL
ALT: 16 U/L (ref 0–35)
AST: 19 U/L (ref 0–37)
Albumin: 3.6 g/dL (ref 3.5–5.2)
Alkaline Phosphatase: 152 U/L — ABNORMAL HIGH (ref 39–117)
Bilirubin, Direct: <0.1 mg/dL (ref 0.0–0.3)
Total Bilirubin: 0.6 mg/dL (ref 0.3–1.2)
Total Protein: 7.5 g/dL (ref 6.0–8.3)

## 2010-03-21 LAB — URINALYSIS, ROUTINE W REFLEX MICROSCOPIC
Bilirubin Urine: NEGATIVE
Glucose, UA: NEGATIVE mg/dL
Hgb urine dipstick: NEGATIVE
Ketones, ur: NEGATIVE mg/dL
Nitrite: NEGATIVE
Protein, ur: NEGATIVE mg/dL
Specific Gravity, Urine: 1.018 (ref 1.005–1.030)
Urobilinogen, UA: 1 mg/dL (ref 0.0–1.0)
pH: 6.5 (ref 5.0–8.0)

## 2010-03-21 LAB — COMPREHENSIVE METABOLIC PANEL
ALT: 20 U/L (ref 0–35)
AST: 18 U/L (ref 0–37)
Albumin: 4.1 g/dL (ref 3.5–5.2)
Alkaline Phosphatase: 171 U/L — ABNORMAL HIGH (ref 39–117)
BUN: 7 mg/dL (ref 6–23)
CO2: 31 mEq/L (ref 19–32)
Calcium: 9.5 mg/dL (ref 8.4–10.5)
Chloride: 105 mEq/L (ref 96–112)
Creatinine, Ser: 0.68 mg/dL (ref 0.4–1.2)
GFR calc Af Amer: >60 mL/min (ref >60)
GFR calc non Af Amer: >60 mL/min (ref >60)
Glucose, Bld: 99 mg/dL (ref 70–99)
Potassium: 3.9 mEq/L (ref 3.5–5.1)
Sodium: 140 mEq/L (ref 135–145)
Total Bilirubin: 1.1 mg/dL (ref 0.3–1.2)
Total Protein: 8.1 g/dL (ref 6.0–8.3)

## 2010-03-21 LAB — TYPE AND SCREEN
ABO/RH(D): O POS
Antibody Screen: NEGATIVE

## 2010-03-21 LAB — CBC
HCT: 33.3 % — ABNORMAL LOW (ref 36.0–46.0)
HCT: 33.9 % — ABNORMAL LOW (ref 36.0–46.0)
HCT: 41.5 % (ref 36.0–46.0)
Hemoglobin: 10.8 g/dL — ABNORMAL LOW (ref 12.0–15.0)
Hemoglobin: 11 g/dL — ABNORMAL LOW (ref 12.0–15.0)
Hemoglobin: 13.7 g/dL (ref 12.0–15.0)
MCH: 29 pg (ref 26.0–34.0)
MCH: 29 pg (ref 26.0–34.0)
MCH: 29.5 pg (ref 26.0–34.0)
MCHC: 32.4 g/dL (ref 30.0–36.0)
MCHC: 32.4 g/dL (ref 30.0–36.0)
MCHC: 33 g/dL (ref 30.0–36.0)
MCV: 89.3 fL (ref 78.0–100.0)
MCV: 89.4 fL (ref 78.0–100.0)
MCV: 89.2 fL (ref 78.0–100.0)
Platelets: 206 10*3/uL (ref 150–400)
Platelets: 216 10*3/uL (ref 150–400)
Platelets: 202 10*3/uL (ref 150–400)
RBC: 3.73 MIL/uL — ABNORMAL LOW (ref 3.87–5.11)
RBC: 3.79 MIL/uL — ABNORMAL LOW (ref 3.87–5.11)
RBC: 4.65 MIL/uL (ref 3.87–5.11)
RDW: 13.9 % (ref 11.5–15.5)
RDW: 14 % (ref 11.5–15.5)
RDW: 13.6 % (ref 11.5–15.5)
WBC: 5.9 10*3/uL (ref 4.0–10.5)
WBC: 7.7 10*3/uL (ref 4.0–10.5)
WBC: 2.7 10*3/uL — ABNORMAL LOW (ref 4.0–10.5)

## 2010-03-21 LAB — APTT: aPTT: 32 seconds (ref 24–37)

## 2010-03-21 LAB — PROTIME-INR
INR: 1 (ref 0.00–1.49)
INR: 1.18 (ref 0.00–1.49)
INR: 1.51 — ABNORMAL HIGH (ref 0.00–1.49)
INR: 0.92 (ref 0.00–1.49)
Prothrombin Time: 13.4 seconds (ref 11.6–15.2)
Prothrombin Time: 15.2 seconds (ref 11.6–15.2)
Prothrombin Time: 18.4 seconds — ABNORMAL HIGH (ref 11.6–15.2)
Prothrombin Time: 12.6 seconds (ref 11.6–15.2)

## 2010-03-21 LAB — SURGICAL PCR SCREEN
MRSA, PCR: NEGATIVE
Staphylococcus aureus: POSITIVE — AB

## 2010-03-21 LAB — ABO/RH: ABO/RH(D): O POS

## 2010-04-14 LAB — D-DIMER, QUANTITATIVE (NOT AT ARMC): D-Dimer, Quant: 0.28 ug/mL-FEU (ref 0.00–0.48)

## 2010-05-23 NOTE — Op Note (Signed)
[place]  Patient:    ARNA, LUIS                       MRN: [id number] Proc. Date: 06/17/99 Adm. Date:  [id number] Disc. Date: [id number] Attending:  Sonda Primes CC:         Mardene Celeste. Lurene Shadow, M.D. (2)             Lindell Spar. Chestine Spore, M.D.                           Operative Report  PREOPERATIVE DIAGNOSES:  Right parathyroid adenoma.  Left breast mass.  POSTOPERATIVE DIAGNOSES:  Follicular adenoma of thyroid and no parathyroid adenoma found on neck exploration.  Left breast mass.  PROCEDURE: 1. Right thyroid lobectomy. 2. Excision of parathyroid right lower pole, excision of parathyroid left    lower pole, excision of mediastinal thymic tissue. 3. Excision of left breast mass/lumpectomy.  SURGEON:  Mardene Celeste. Lurene Shadow, M.D.  ASSISTANT:  Marnee Spring. Wiliam Ke, M.D.  SECOND ASSISTANT:  ______ .  ANESTHESIA:  General.  INDICATION:  This patient is a 55 year old woman who presented with relatively mild hypercalcemia, with calcium levels of 10.5 to 11.  She underwent a sestamibi scan which indicated increased and persistent uptake in the right lower pole, consistent with a right lower pole parathyroid adenoma; she also had parathormone levels drawn and she did have an elevated parathormone level, moderately, in the range of 56, and this was consistent with a parathyroid adenoma.  The patient also has a large ______ -sided breast mass located at the 12 oclock axis of the left breast which was not imaged on recent mammogram.  She is brought to the operating room now for parathyroid exploration and for excision of the left breast mass.  DESCRIPTION OF PROCEDURE:  Following the induction of anesthesia, with the patient positioned supinely, head and neck hyperextended, the neck and anterior chest and breast were prepped and draped to be included in the sterile operative field.  I made a transverse collar incision approximately two  fingerbreadths above the sternal notch, deepened this through the skin and subcutaneous tissues across the platysma muscle, raising the superior flaps up to the thyroid cartilage and an inferior flap down to the sternal notch. Strap muscles were divided in the midline.  There were very large external jugular veins which had to be ligated to accomplish this.  Dissection was carried out on the right side, where dissection was carried down to the right lower pole of the thyroid.  On palpation, there was a mass within the right lower pole of the thyroid and the right lower pole of the thyroid was then dissected up and dissection carried out in the region of the tracheoesophageal groove where the recurrent laryngeal nerve was positively identified and protected throughout the course of this dissection.  The right lower pole parathyroid was identified and was somewhat adherent to some thymic tissue. This did not appear to be large or consistent with an adenoma; this was, however, excised by dissecting it free and securing hemostasis between clamps and forwarding this for pathologic evaluation.  Frozen section diagnosis confirmed that there was parathyroid tissue associated with some thymic tissue.  Continuing dissection on this side, we decided then to remove the right lobe of the thyroid, in that there was a mass within the lower pole of the thyroid gland.  The right lower  pole of the thyroid was then fully mobilized, the recurrent laryngeal nerve dissected free and dissection carried up along the recurrent laryngeal nerve, freeing the entire right lobe of the thyroid.  The thyro-thymic vessels were taken between clamps and secured with ties of 2-0 silk.  The upper pole of the thyroid was doubly clamped and secured with 0 silk ties.  Dissection was carried across the trachea up to the thyroid isthmus where the isthmus was serially clamped and suture-ligated with 3-0 Vicryl sutures and the right  lobe of the thyroid forwarded for pathologic evaluation.  The frozen section on this lesion showed it to be consistent with a follicular adenoma.  There was no parathyroid tissue noted within the right lobe of the thyroid.  We continued dissection up towards the upper pole on the right side where a right upper pole parathyroid gland was identified but not disturbed.  Dissection was carried down to the left lower pole where again another parathyroid gland was identified.  This was dissected free and removed and forwarded for pathologic evaluation.  The frozen section diagnosis of this was parathyroid tissue; no adenoma was noted.  The recurrent laryngeal nerve on the left side was positively identified and protected throughout the course of the dissection.  Dissection was carried up towards the upper pole on the left side where again the upper pole parathyroid gland was identified but left undisturbed.  Dissection was again carried down on the right side into the tracheoesophageal groove, into the carotid sheath, without any evidence of any additional parathyroid tissues being identified and down within the mediastinum, there was a tongue of tissue which was consistent with thymic tissue.  This was dissected free and forwarded for pathologic evaluation and it confirmed our gross evaluation of its being thymic tissue.  Palpation within the mediastinum did not reveal any masses within the mediastinum.  We did not visually explore the superior or inferior mediastinum.  All areas of dissection were then checked for hemostasis so we decided that we would terminate the procedure at this point.  Sponge, instrument and sharp counts were verified.  The midline strap muscles were closed with running 3-0 Vicryl sutures, the platysmal muscle reapproximated with interrupted 3-0 Vicryl sutures, skin closed with 5-0 Monocryl suture and then reinforced with Steri-Strips.  Attention then turned to the left  breast where a circumareolar incision was made on the superior border of the nipple, deepened through skin and  subcutaneous tissue, with dissection being carried down to the mass, which measured approximately 6 cm in greatest diameter, and was identified.  It was somewhat soft and rubbery but a well-defined mass.  This was dissected free in its entirety from the left breast and forwarded for pathologic evaluation and pathology is currently pending.  Hemostasis was secured with electrocautery, sponge and instrument counts verified and the breast tissues reapproximated with interrupted 3-0 Vicryl sutures, subcutaneous tissues closed with 3-0 Vicryl sutures and the skin closed with 5-0 Monocryl sutures.  Both the neck and breast wounds were reinforced with Steri-Strips and sterile dressings were applied.  The anesthetic was then reversed and the patient removed from the operating room to the recovery room in stable condition, having tolerated the procedure well. DD:  06/17/99 TD:  06/20/99 Job: [id number]

## 2010-05-23 NOTE — H&P (Signed)
[place]  Patient:    Elaine Allen, Elaine Allen                       MRN: [id number] Adm. Date:  [id number] Attending:  Wende Mott                         History and Physical  CHIEF COMPLAINT:  Painful left knee.  HISTORY OF PRESENT ILLNESS:  This is a 55 year old female who has been followed in the office for internal derangement involving the left knee with recurrent pain, buckling, swelling, and difficulty with ambulation.  The patient is being treated with anti-inflammatory and quad exercises with no apparent improvement.  PAST MEDICAL HISTORY:  Hysterectomy, tubal ligation, left breast biopsy.  MEDICATIONS:  Vioxx, Trental, vitamins, Xanax.  ALLERGIES:  DEMEROL and CODEINE.  HABITS:  None.  FAMILY HISTORY:  Noncontributory.  REVIEW OF SYSTEMS:  No cardiac or respiratory:  No urinary or bowel symptoms. he patient also has history of hyperthyroidism.  PHYSICAL EXAMINATION:  VITAL SIGNS:  Temperature 97.1, pulse 88, respirations 16, blood pressure 100/60. Height 5 feet 4 inches.  Weight 184.  HEENT:  Normocephalic.  Eyes:  Conjunctivae and sclerae are clear.  NECK:  Supple.  CHEST:  Clear.  CARDIAC:  S1 and S2 regular.  EXTREMITIES:  Left knee genu varum.  Positive McMurrays.  Palpable and audible click.  Tender, +2 effusion.  Range of motion is good.  Negative for Lachman and negative pivot shift test.  IMPRESSION:  Internal derangement of left knee. DD:  04/29/99 TD:  04/29/99 Job: [id number]

## 2010-05-23 NOTE — Op Note (Signed)
[place]. [facility]  Patient:    Elaine Allen, Elaine Allen                       MRN: [id number] Proc. Date: 04/29/99 Adm. Date:  [id number] Attending:  Wende Mott                           Operative Report  PREOPERATIVE DIAGNOSIS:  Internal derangement, left knee.  POSTOPERATIVE DIAGNOSES:  Tear of medial meniscus of posterior horn; chondral defect in medial femoral condyle; loose bodies left knee; chronic synovitis, left knee.  OPERATION:  1. Arthroscopic complete synovectomy.  2. Chondroplasty medial femoral condyle.  3. Partial medial meniscectomy.  SURGEON:  Kennieth Rad, M.D.  ANESTHESIA:  General.  DESCRIPTION OF PROCEDURE:  The patient was taken to the operating room after given adequate preoperative medication, given general anesthesia and intubated.  The eft knee was prepped with DuraPrep, and draped in a sterile manner.  Tourniquet was  used for hemostasis.  One-half inch puncture wound made along the anteromedial nd lateral joint line.  Inflow was through the medial suprapatellar pouch area. Inspection of the joint revealed large chondral defect involving the medial femoral condyle, approximately the size of a silver dollar.  Loose fragments were found  posterior aspect of the knee.  Medial meniscal tear about the posterior horn and small fragment anteriorly.  Markedly thickened synovium both medial and lateral  compartment anteriorly lateral compartment.  Articular surface of the femur, as  well as tibia and lateral meniscus were well preserved.  The anterior cruciate as intact.  Complete synovectomy was done, followed by resection of the torn portion of the posterior horn of the medial meniscus with basket forceps, and then chondroplasty followed by rasping and smoothing of the chondral surface. Copious and abundant irrigation for other loose fragments was done.  Further inspection did not reveal any other loose  fragments.  Wound closure was then done with 4-0 nylon followed by 10 cc of 0.5% Marcaine injected into the joint.  Compressive dressing was applied and knee immobilizer applied.  The patient tolerated the procedure well and was taken to the recovery room in stable and satisfactory condition.  The patient will be discharged home on Prevacid 5 mg 1 q.4h. p.r.n. for pain, ice packs, elevation, nonweightbearing on the left side, and to return to the office in one week. DD:  04/29/99 TD:  04/29/99 Job: [id number]

## 2010-08-10 ENCOUNTER — Emergency Department: Payer: Self-pay | Admitting: Emergency Medicine

## 2010-09-17 ENCOUNTER — Emergency Department: Payer: Self-pay | Admitting: Emergency Medicine

## 2012-02-05 ENCOUNTER — Ambulatory Visit: Payer: Self-pay | Admitting: Oncology

## 2012-02-05 LAB — CBC CANCER CENTER
Basophil #: 0 x10 3/mm (ref 0.0–0.1)
Basophil %: 0.5 %
Eosinophil #: 0.1 x10 3/mm (ref 0.0–0.7)
Eosinophil %: 2.4 %
HCT: 39.9 % (ref 35.0–47.0)
HGB: 13.4 g/dL (ref 12.0–16.0)
Lymphocyte #: 1.3 x10 3/mm (ref 1.0–3.6)
Lymphocyte %: 45.5 %
MCH: 29.4 pg (ref 26.0–34.0)
MCHC: 33.6 g/dL (ref 32.0–36.0)
MCV: 88 fL (ref 80–100)
Monocyte #: 0.3 x10 3/mm (ref 0.2–0.9)
Monocyte %: 11.7 %
Neutrophil #: 1.2 x10 3/mm — ABNORMAL LOW (ref 1.4–6.5)
Neutrophil %: 39.9 %
Platelet: 172 x10 3/mm (ref 150–440)
RBC: 4.56 10*6/uL (ref 3.80–5.20)
RDW: 13.8 % (ref 11.5–14.5)
WBC: 3 x10 3/mm — ABNORMAL LOW (ref 3.6–11.0)

## 2012-02-05 LAB — LACTATE DEHYDROGENASE: LDH: 179 U/L (ref 81–246)

## 2012-02-06 ENCOUNTER — Ambulatory Visit: Payer: Self-pay | Admitting: Oncology

## 2012-02-17 LAB — PROT IMMUNOELECTROPHORES(ARMC)

## 2012-05-05 ENCOUNTER — Ambulatory Visit: Payer: Self-pay | Admitting: Oncology

## 2012-06-13 ENCOUNTER — Ambulatory Visit: Payer: Self-pay | Admitting: Oncology

## 2013-01-05 DIAGNOSIS — I639 Cerebral infarction, unspecified: Secondary | ICD-10-CM

## 2013-01-05 HISTORY — DX: Cerebral infarction, unspecified: I63.9

## 2013-05-14 ENCOUNTER — Emergency Department (HOSPITAL_COMMUNITY): Payer: Medicare HMO

## 2013-05-14 ENCOUNTER — Inpatient Hospital Stay (HOSPITAL_COMMUNITY)
Admission: EM | Admit: 2013-05-14 | Discharge: 2013-05-16 | DRG: 064 | Disposition: A | Discharge status: Home / Self Care | Payer: Medicare HMO | Attending: Internal Medicine | Admitting: Internal Medicine

## 2013-05-14 ENCOUNTER — Encounter (HOSPITAL_COMMUNITY): Payer: Self-pay | Admitting: Emergency Medicine

## 2013-05-14 DIAGNOSIS — M899 Disorder of bone, unspecified: Secondary | ICD-10-CM

## 2013-05-14 DIAGNOSIS — H5500 Unspecified nystagmus: Secondary | ICD-10-CM | POA: Diagnosis present

## 2013-05-14 DIAGNOSIS — Z79899 Other long term (current) drug therapy: Secondary | ICD-10-CM

## 2013-05-14 DIAGNOSIS — M25569 Pain in unspecified knee: Secondary | ICD-10-CM

## 2013-05-14 DIAGNOSIS — J209 Acute bronchitis, unspecified: Secondary | ICD-10-CM | POA: Diagnosis present

## 2013-05-14 DIAGNOSIS — Z8673 Personal history of transient ischemic attack (TIA), and cerebral infarction without residual deficits: Secondary | ICD-10-CM | POA: Diagnosis present

## 2013-05-14 DIAGNOSIS — I639 Cerebral infarction, unspecified: Secondary | ICD-10-CM | POA: Diagnosis present

## 2013-05-14 DIAGNOSIS — IMO0002 Reserved for concepts with insufficient information to code with codable children: Secondary | ICD-10-CM

## 2013-05-14 DIAGNOSIS — I679 Cerebrovascular disease, unspecified: Secondary | ICD-10-CM | POA: Diagnosis present

## 2013-05-14 DIAGNOSIS — H3320 Serous retinal detachment, unspecified eye: Secondary | ICD-10-CM | POA: Diagnosis present

## 2013-05-14 DIAGNOSIS — D649 Anemia, unspecified: Secondary | ICD-10-CM | POA: Diagnosis present

## 2013-05-14 DIAGNOSIS — M329 Systemic lupus erythematosus, unspecified: Secondary | ICD-10-CM | POA: Diagnosis present

## 2013-05-14 DIAGNOSIS — M949 Disorder of cartilage, unspecified: Secondary | ICD-10-CM

## 2013-05-14 DIAGNOSIS — E785 Hyperlipidemia, unspecified: Secondary | ICD-10-CM | POA: Diagnosis present

## 2013-05-14 DIAGNOSIS — E210 Primary hyperparathyroidism: Secondary | ICD-10-CM | POA: Diagnosis present

## 2013-05-14 DIAGNOSIS — I7774 Dissection of vertebral artery: Secondary | ICD-10-CM | POA: Diagnosis present

## 2013-05-14 DIAGNOSIS — R42 Dizziness and giddiness: Secondary | ICD-10-CM | POA: Diagnosis present

## 2013-05-14 DIAGNOSIS — M1710 Unilateral primary osteoarthritis, unspecified knee: Secondary | ICD-10-CM

## 2013-05-14 DIAGNOSIS — R279 Unspecified lack of coordination: Secondary | ICD-10-CM | POA: Diagnosis present

## 2013-05-14 DIAGNOSIS — I6350 Cerebral infarction due to unspecified occlusion or stenosis of unspecified cerebral artery: Principal | ICD-10-CM | POA: Diagnosis present

## 2013-05-14 DIAGNOSIS — I10 Essential (primary) hypertension: Secondary | ICD-10-CM | POA: Diagnosis present

## 2013-05-14 HISTORY — DX: Reserved for concepts with insufficient information to code with codable children: IMO0002

## 2013-05-14 HISTORY — DX: Calculus of kidney: N20.0

## 2013-05-14 HISTORY — DX: Systemic lupus erythematosus, unspecified: M32.9

## 2013-05-14 HISTORY — DX: Other diseases of vocal cords: J38.3

## 2013-05-14 HISTORY — DX: Serous retinal detachment, unspecified eye: H33.20

## 2013-05-14 HISTORY — DX: Anemia, unspecified: D64.9

## 2013-05-14 LAB — COMPREHENSIVE METABOLIC PANEL
ALT: 13 U/L (ref 0–35)
AST: 21 U/L (ref 0–37)
Albumin: 3.7 g/dL (ref 3.5–5.2)
Alkaline Phosphatase: 114 U/L (ref 39–117)
BUN: 9 mg/dL (ref 6–23)
CO2: 24 mEq/L (ref 19–32)
Calcium: 9.1 mg/dL (ref 8.4–10.5)
Chloride: 104 mEq/L (ref 96–112)
Creatinine, Ser: 0.68 mg/dL (ref 0.50–1.10)
GFR calc Af Amer: >90 mL/min (ref >90)
GFR calc non Af Amer: >90 mL/min (ref >90)
Glucose, Bld: 107 mg/dL — ABNORMAL HIGH (ref 70–99)
Potassium: 3.6 mEq/L — ABNORMAL LOW (ref 3.7–5.3)
Sodium: 143 mEq/L (ref 137–147)
Total Bilirubin: 0.6 mg/dL (ref 0.3–1.2)
Total Protein: 8.3 g/dL (ref 6.0–8.3)

## 2013-05-14 LAB — PROTIME-INR
INR: 0.98 (ref 0.00–1.49)
INR: 1.02 (ref 0.00–1.49)
Prothrombin Time: 12.8 seconds (ref 11.6–15.2)
Prothrombin Time: 13.2 seconds (ref 11.6–15.2)

## 2013-05-14 LAB — TROPONIN I: Troponin I: <0.3 ng/mL (ref <0.30)

## 2013-05-14 LAB — CBC WITH DIFFERENTIAL/PLATELET
Basophils Absolute: 0 10*3/uL (ref 0.0–0.1)
Basophils Relative: 0 % (ref 0–1)
Eosinophils Absolute: 0 10*3/uL (ref 0.0–0.7)
Eosinophils Relative: 0 % (ref 0–5)
HCT: 41.4 % (ref 36.0–46.0)
Hemoglobin: 13.6 g/dL (ref 12.0–15.0)
Lymphocytes Relative: 29 % (ref 12–46)
Lymphs Abs: 1.1 10*3/uL (ref 0.7–4.0)
MCH: 29.2 pg (ref 26.0–34.0)
MCHC: 32.9 g/dL (ref 30.0–36.0)
MCV: 89 fL (ref 78.0–100.0)
Monocytes Absolute: 0.3 10*3/uL (ref 0.1–1.0)
Monocytes Relative: 7 % (ref 3–12)
Neutro Abs: 2.5 10*3/uL (ref 1.7–7.7)
Neutrophils Relative %: 64 % (ref 43–77)
Platelets: 123 10*3/uL — ABNORMAL LOW (ref 150–400)
RBC: 4.65 MIL/uL (ref 3.87–5.11)
RDW: 13.7 % (ref 11.5–15.5)
WBC: 3.9 10*3/uL — ABNORMAL LOW (ref 4.0–10.5)

## 2013-05-14 MED ORDER — METOCLOPRAMIDE HCL 5 MG/ML IJ SOLN
20.0000 mg | Freq: Once | INTRAVENOUS | Status: AC
  Administered 2013-05-14: 20 mg via INTRAVENOUS
  Filled 2013-05-14: qty 4

## 2013-05-14 MED ORDER — LORAZEPAM 2 MG/ML IJ SOLN
1.0000 mg | Freq: Once | INTRAMUSCULAR | Status: AC
  Administered 2013-05-14: 1 mg via INTRAVENOUS
  Filled 2013-05-14: qty 1

## 2013-05-14 MED ORDER — SODIUM CHLORIDE 0.9 % IV SOLN: 250.0000 mL | INTRAVENOUS | Status: DC | PRN

## 2013-05-14 MED ORDER — SODIUM CHLORIDE 0.9 % IJ SOLN
3.0000 mL | Freq: Two times a day (BID) | INTRAMUSCULAR | Status: DC
  Administered 2013-05-14 – 2013-05-16 (×4): 3 mL via INTRAVENOUS

## 2013-05-14 MED ORDER — MECLIZINE HCL 25 MG PO TABS
25.0000 mg | ORAL_TABLET | Freq: Once | ORAL | Status: AC
  Administered 2013-05-14: 25 mg via ORAL
  Filled 2013-05-14: qty 1

## 2013-05-14 MED ORDER — ASPIRIN 325 MG PO TABS
325.0000 mg | ORAL_TABLET | Freq: Every day | ORAL | Status: DC
  Administered 2013-05-14 – 2013-05-16 (×3): 325 mg via ORAL
  Filled 2013-05-14 (×3): qty 1

## 2013-05-14 MED ORDER — SODIUM CHLORIDE 0.9 % IV SOLN: INTRAVENOUS | Status: DC

## 2013-05-14 MED ORDER — SODIUM CHLORIDE 0.9 % IJ SOLN: 3.0000 mL | INTRAMUSCULAR | Status: DC | PRN

## 2013-05-14 MED ORDER — SODIUM CHLORIDE 0.9 % IV SOLN
1000.0000 mL | INTRAVENOUS | Status: DC
Start: 2013-05-14 — End: 2013-05-14
  Administered 2013-05-14: 1000 mL via INTRAVENOUS

## 2013-05-14 MED ORDER — ONDANSETRON HCL 4 MG/2ML IJ SOLN
4.0000 mg | Freq: Three times a day (TID) | INTRAMUSCULAR | Status: AC | PRN
Start: 2013-05-14 — End: 2013-05-15

## 2013-05-14 MED ORDER — DIPHENHYDRAMINE HCL 50 MG/ML IJ SOLN
50.0000 mg | Freq: Once | INTRAMUSCULAR | Status: AC
  Administered 2013-05-14: 50 mg via INTRAVENOUS
  Filled 2013-05-14: qty 1

## 2013-05-14 MED ORDER — CLOPIDOGREL BISULFATE 75 MG PO TABS
75.0000 mg | ORAL_TABLET | Freq: Every day | ORAL | Status: DC
  Administered 2013-05-14 – 2013-05-16 (×3): 75 mg via ORAL
  Filled 2013-05-14 (×3): qty 1

## 2013-05-14 MED ORDER — ONDANSETRON HCL 4 MG/2ML IJ SOLN
4.0000 mg | Freq: Once | INTRAMUSCULAR | Status: AC
Start: 2013-05-14 — End: 2013-05-14
  Administered 2013-05-14: 4 mg via INTRAVENOUS
  Filled 2013-05-14: qty 2

## 2013-05-14 MED ORDER — SODIUM CHLORIDE 0.9 % IV SOLN
1000.0000 mL | Freq: Once | INTRAVENOUS | Status: AC
  Administered 2013-05-14: 1000 mL via INTRAVENOUS

## 2013-05-14 NOTE — ED Notes (Signed)
Patient returned from CT

## 2013-05-14 NOTE — ED Notes (Signed)
Feeling dizzy and nauseated yesterday; today nauseated and went to bathroom to vomit; got very dizzy and fell down, hit back of head on the wall. Reports room spinning sensation. Given 4mg  Zofran by EMS.

## 2013-05-14 NOTE — ED Notes (Signed)
David, MD at bedside.  

## 2013-05-14 NOTE — ED Notes (Signed)
CT tech had to remove earrings from pt for head scan.  3 earrings from each ear for a total of 6 earrings, removed, and placed into a denture cup, labeled with pt's name and date of birth.

## 2013-05-14 NOTE — ED Notes (Signed)
Patient transported to CT 

## 2013-05-14 NOTE — Consult Note (Signed)
Referring Physician: Tomi Bamberger    Chief Complaint: Dizziness  HPI: Elaine Allen is an 58 y.o. female who noted her symptoms first on Wednesday at 1400 when she noted acute onset vertigo while grocery shopping with her client.  Her symptoms resolved spontaneously and relatively quickly and the patient returned to baseline.  On Friday the patient woke up with a feeling of being hot, nausea, vomiting and vertigo.  She also felt that she was falling to the left.  This lasted the entire day and when she woke up on Saturday her symptoms were worse.  They lasted a couple of hours then resolved completely.  The patient went to bed normal.  When she awakened today the patient again had nausea, vomiting and vertigo.  She lost her balance and fell backward hitting her head.  Her client became concerned and EMS was called.  Patient was brought in for evaluation.  Date last known well: Date: 05/13/2013 Time last known well: Time: 21:30 tPA Given: No: Outside time window  Past Medical History  Diagnosis Date  . Lupus   . Spasmodic dysphonia   . Chronic anemia   . Kidney stones   . Retinal detachment     Past Surgical History  Procedure Laterality Date  . Abdominal hysterectomy    . Thyroid surgery    . Knee surgery Left     Family History  Problem Relation Age of Onset  . Cancer Mother     liver  . Cancer Father     prostate  . Diabetes Mother    Social History:  reports that she has never smoked. She does not have any smokeless tobacco history on file. She reports that she does not drink alcohol or use illicit drugs.  Allergies:  Allergies  Allergen Reactions  . Hydroxychloroquine Sulfate Rash and Other (See Comments)    blisters    Medications: I have reviewed the patient's current medications. Prior to Admission:   Current outpatient prescriptions:OVER THE COUNTER MEDICATION ALLERGY MED, Take 1 tablet by mouth daily as needed (sinuses)., Disp: , Rfl:   ROS: History obtained from  the patient  General ROS: negative for - chills, fatigue, fever, night sweats, weight gain or weight loss Psychological ROS: negative for - behavioral disorder, hallucinations, memory difficulties, mood swings or suicidal ideation Ophthalmic ROS: negative for - blurry vision, double vision, eye pain or loss of vision ENT ROS: as noted in HPI Allergy and Immunology ROS: negative for - hives or itchy/watery eyes Hematological and Lymphatic ROS: negative for - bleeding problems, bruising or swollen lymph nodes Endocrine ROS: negative for - galactorrhea, hair pattern changes, polydipsia/polyuria or temperature intolerance Respiratory ROS: negative for - cough, hemoptysis, shortness of breath or wheezing Cardiovascular ROS: negative for - chest pain, dyspnea on exertion, edema or irregular heartbeat Gastrointestinal ROS: as noted in HPI Genito-Urinary ROS: negative for - dysuria, hematuria, incontinence or urinary frequency/urgency Musculoskeletal ROS: negative for - joint swelling or muscular weakness Neurological ROS: as noted in HPI Dermatological ROS: negative for rash and skin lesion changes  Physical Examination: Blood pressure 173/89, pulse 100, temperature 97.6 F (36.4 C), temperature source Oral, resp. rate 20, height $RemoveBe'5\' 4"'llhdlhbSy$  (1.626 m), weight 81.647 kg (180 lb), SpO2 95.00%.  Neurologic Examination: Mental Status: Alert, oriented, thought content appropriate.  Speech fluent without evidence of aphasia.  Able to follow 3 step commands without difficulty. Cranial Nerves: II: Discs flat bilaterally; Visual fields grossly normal, pupils equal, round, reactive to light and accommodation III,IV,  VI: ptosis not present, extra-ocular motions intact bilaterally with horizontal nystagmus in all quadrants-worse on looking to the left V,VII: smile symmetric, facial light touch sensation normal bilaterally VIII: hearing normal bilaterally IX,X: gag reflex present XI: bilateral shoulder  shrug XII: midline tongue extension Motor: Right : Upper extremity   5/5    Left:     Upper extremity   5-/5  Lower extremity   5/5     Lower extremity   5-/5 Tone and bulk:normal tone throughout; no atrophy noted Sensory: Pinprick and light touch intact throughout, bilaterally Deep Tendon Reflexes: 2+ and symmetric throughout Plantars: Right: downgoing   Left: downgoing Cerebellar: Dysmetria with finger-to-nose testing on the left.  Normal heel-to-shin testing Gait: Unable to test CV: pulses palpable throughout     Laboratory Studies:  Basic Metabolic Panel: No results found for this basename: NA, K, CL, CO2, GLUCOSE, BUN, CREATININE, CALCIUM, MG, PHOS,  in the last 168 hours  Liver Function Tests: No results found for this basename: AST, ALT, ALKPHOS, BILITOT, PROT, ALBUMIN,  in the last 168 hours No results found for this basename: LIPASE, AMYLASE,  in the last 168 hours No results found for this basename: AMMONIA,  in the last 168 hours  CBC: No results found for this basename: WBC, NEUTROABS, HGB, HCT, MCV, PLT,  in the last 168 hours  Cardiac Enzymes: No results found for this basename: CKTOTAL, CKMB, CKMBINDEX, TROPONINI,  in the last 168 hours  BNP: No components found with this basename: POCBNP,   CBG: No results found for this basename: GLUCAP,  in the last 168 hours  Microbiology: Results for orders placed during the hospital encounter of 08/21/09  SURGICAL PCR SCREEN     Status: Abnormal   Collection Time    08/15/09  2:30 PM      Result Value Ref Range Status   MRSA, PCR NEGATIVE  NEGATIVE Final   Staphylococcus aureus   (*) NEGATIVE Final   Value: POSITIVE            The Xpert SA Assay (FDA     approved for NASAL specimens     only), is one component of     a comprehensive surveillance     program.  It is not intended     to diagnose infection nor to     guide or monitor treatment.    Coagulation Studies: No results found for this basename:  LABPROT, INR,  in the last 72 hours  Urinalysis: No results found for this basename: COLORURINE, APPERANCEUR, LABSPEC, PHURINE, GLUCOSEU, HGBUR, BILIRUBINUR, KETONESUR, PROTEINUR, UROBILINOGEN, NITRITE, LEUKOCYTESUR,  in the last 168 hours  Lipid Panel:    Component Value Date/Time   CHOL 170 12/20/2007 0935   TRIG 96 12/20/2007 0935   HDL 40 12/20/2007 0935   CHOLHDL 4.3 Ratio 12/20/2007 0935   VLDL 19 12/20/2007 0935   LDLCALC 111* 12/20/2007 0935    HgbA1C:  No results found for this basename: HGBA1C    Urine Drug Screen:   No results found for this basename: labopia,  cocainscrnur,  labbenz,  amphetmu,  thcu,  labbarb    Alcohol Level: No results found for this basename: ETH,  in the last 168 hours  Other results: EKG: sinus arhythmia at 68 bpm.  Imaging: Ct Head Wo Contrast  05/14/2013   CLINICAL DATA:  Fall. Blunt trauma to back of head. Dizziness and nausea.  EXAM: CT HEAD WITHOUT CONTRAST  TECHNIQUE: Contiguous axial images were obtained from  the base of the skull through the vertex without intravenous contrast.  COMPARISON:  None.  FINDINGS: There is no evidence of intracranial hemorrhage, brain edema, or other signs of acute infarction. There is no evidence of intracranial mass lesion or mass effect. No abnormal extraaxial fluid collections are identified.  Mild central cerebral atrophy is noted. Persistent cavum septum pellucidum and cavum vergae noted. No evidence hydrocephalus. No evidence of skull fracture or other significant bone abnormality.  IMPRESSION: No acute intracranial abnormality.  Mild cerebral atrophy.   Electronically Signed   By: Earle Gell M.D.   On: 05/14/2013 14:26   Mr Jodene Nam Head Wo Contrast  05/14/2013   CLINICAL DATA:  Acute onset of dizziness and severe vertigo with falling. Question stroke.  EXAM: MRI HEAD WITHOUT CONTRAST  MRA HEAD WITHOUT CONTRAST  TECHNIQUE: Multiplanar, multiecho pulse sequences of the brain and surrounding structures were  obtained without intravenous contrast. Angiographic images of the head were obtained using MRA technique without contrast.  COMPARISON:  None.  FINDINGS: MRI HEAD FINDINGS  Multi focal restricted diffusion is evident within the inferior left cerebellum compatible with an acute nonhemorrhagic left PICA territory infarct. No other acute infarct is present.  No hemorrhage or mass lesion is present. T2 changes are present in association with the infarct. Mild periventricular and subcortical white matter changes are noted as well. Incidental note is made of a persistent cavum septum pellucidum et vergae. Mild generalized atrophy is evident. The ventricles are proportionate to the degree of atrophy. No significant extra-axial fluid collection is present.  The left vertebral artery is occluded. Flow is present in the right vertebral artery. Flow is present in the anterior circulation.  Scleral banding is noted at the right globe. The globes are mildly enlarged bilaterally. The orbits are otherwise intact. A fluid level is present in the right maxillary sinus. Mild mucosal thickening is present in the ethmoid air cells bilaterally, right greater than left. A left mastoid effusion is present. No obstructing nasopharyngeal lesion is evident.  MRA HEAD FINDINGS  The left vertebral artery occlusion is confirmed. This may be an acute dissection. The right vertebral artery is normal. The right PICA origin is within normal limits. The basilar artery is unremarkable. Both posterior cerebral arteries originate from the basilar tip.  The internal carotid arteries are within normal limits from the high cervical segments through the ICA termini bilaterally. The A1 and M1 segments are normal. The anterior communicating artery is patent. The MCA bifurcations are within normal limits bilaterally. ACA and MCA branch vessels are unremarkable.  IMPRESSION: 1. Acute nonhemorrhagic left PICA territory infarct. 2. Occluded left vertebral  artery. This likely represents an acute dissection. 3. Bupthalmos.  Status post banding of the right globe. Critical Value/emergent results were called by telephone at the time of interpretation on 05/14/2013 at 5:50 PM to Dr. Rolland Porter , who verbally acknowledged these results.   Electronically Signed   By: Lawrence Santiago M.D.   On: 05/14/2013 18:00   Mr Brain Wo Contrast  05/14/2013   CLINICAL DATA:  Acute onset of dizziness and severe vertigo with falling. Question stroke.  EXAM: MRI HEAD WITHOUT CONTRAST  MRA HEAD WITHOUT CONTRAST  TECHNIQUE: Multiplanar, multiecho pulse sequences of the brain and surrounding structures were obtained without intravenous contrast. Angiographic images of the head were obtained using MRA technique without contrast.  COMPARISON:  None.  FINDINGS: MRI HEAD FINDINGS  Multi focal restricted diffusion is evident within the inferior left cerebellum compatible with  an acute nonhemorrhagic left PICA territory infarct. No other acute infarct is present.  No hemorrhage or mass lesion is present. T2 changes are present in association with the infarct. Mild periventricular and subcortical white matter changes are noted as well. Incidental note is made of a persistent cavum septum pellucidum et vergae. Mild generalized atrophy is evident. The ventricles are proportionate to the degree of atrophy. No significant extra-axial fluid collection is present.  The left vertebral artery is occluded. Flow is present in the right vertebral artery. Flow is present in the anterior circulation.  Scleral banding is noted at the right globe. The globes are mildly enlarged bilaterally. The orbits are otherwise intact. A fluid level is present in the right maxillary sinus. Mild mucosal thickening is present in the ethmoid air cells bilaterally, right greater than left. A left mastoid effusion is present. No obstructing nasopharyngeal lesion is evident.  MRA HEAD FINDINGS  The left vertebral artery occlusion is  confirmed. This may be an acute dissection. The right vertebral artery is normal. The right PICA origin is within normal limits. The basilar artery is unremarkable. Both posterior cerebral arteries originate from the basilar tip.  The internal carotid arteries are within normal limits from the high cervical segments through the ICA termini bilaterally. The A1 and M1 segments are normal. The anterior communicating artery is patent. The MCA bifurcations are within normal limits bilaterally. ACA and MCA branch vessels are unremarkable.  IMPRESSION: 1. Acute nonhemorrhagic left PICA territory infarct. 2. Occluded left vertebral artery. This likely represents an acute dissection. 3. Bupthalmos.  Status post banding of the right globe. Critical Value/emergent results were called by telephone at the time of interpretation on 05/14/2013 at 5:50 PM to Dr. Rolland Porter , who verbally acknowledged these results.   Electronically Signed   By: Lawrence Santiago M.D.   On: 05/14/2013 18:00    Assessment: 58 y.o. female presenting with dizziness that has been intermittent for the past 5 days but persistent since this morning.  MRI of the brain reviewed and shows a left PICA infarct with occlusion of the left vertebral artery.  Occlusion likely secondary to a dissection.  NIHSS of 1 for dysmetria.  Patient on no antiplatelet therapy at home.  No history of trauma.   Stroke Risk Factors - hypercholesterolemia  Plan: 1. HgbA1c, fasting lipid panel, ESR 2. PT consult, OT consult, Speech consult 3. Echocardiogram 4. Carotid dopplers 5. Prophylactic therapy-ASA and Plavix daily.  First dose in ED 6. Risk factor modification.  Statin initiation. 7. Telemetry monitoring 8. Frequent neuro checks  Case discussed with Dr. Mariella Saa, MD [facility] [phone] 05/14/2013, 6:36 PM

## 2013-05-14 NOTE — ED Notes (Signed)
Patient returned from MRI.

## 2013-05-14 NOTE — ED Notes (Signed)
Patient transported to MRI 

## 2013-05-14 NOTE — ED Notes (Signed)
Patient still in MRI.  

## 2013-05-14 NOTE — H&P (Signed)
PCP:   MARTIN,NYKEDTRA, NP   Chief Complaint:  vertigo  HPI: 58 yo female h/o sle, hyperparathyroidism s/p parathyroidectomy comes in with several days of persistent dizziness/vertigo that almost made her pass out today.  She says about 3 days ago she was in the grocery store and she had this sudden onset of vertigo with sharp pain in back of her neck, the vertigo was brief and resolved on its own within several minutes.  Then a day or so later the symptoms returned, not the headache but the vertiginous symptoms.  No other focal neurological deficits.  Her vertigo is improved if she remains lying still.  No n/v.  No rashes.  No fevers.  We are asked to admit for cva shown on mri.  Review of Systems:  Positive and negative as per HPI otherwise all other systems are negative  Past Medical History: Past Medical History  Diagnosis Date  . Lupus   . Spasmodic dysphonia   . Chronic anemia   . Kidney stones   . Retinal detachment    Past Surgical History  Procedure Laterality Date  . Abdominal hysterectomy    . Thyroid surgery    . Knee surgery Left     Medications: Prior to Admission medications   Medication Sig Start Date End Date Taking? Authorizing Provider  OVER THE COUNTER MEDICATION Take 1 tablet by mouth daily as needed (sinuses).   Yes Historical Provider, MD    Allergies:   Allergies  Allergen Reactions  . Hydroxychloroquine Sulfate Rash and Other (See Comments)    blisters    Social History:  reports that she has never smoked. She does not have any smokeless tobacco history on file. She reports that she does not drink alcohol or use illicit drugs.  Family History: Family History  Problem Relation Age of Onset  . Cancer Mother     liver  . Cancer Father     prostate  . Diabetes Mother     Physical Exam: Filed Vitals:   05/14/13 1600 05/14/13 1832 05/14/13 1930 05/14/13 2000  BP: 146/67 173/89 136/83 149/85  Pulse: 94 100 85 89  Temp:      TempSrc:       Resp: 26 20 18 16   Height:      Weight:      SpO2: 96% 95% 98% 99%   General appearance: alert, cooperative and no distress Head: Normocephalic, without obvious abnormality, atraumatic Eyes: negative Nose: Nares normal. Septum midline. Mucosa normal. No drainage or sinus tenderness. Neck: no JVD and supple, symmetrical, trachea midline Lungs: clear to auscultation bilaterally Heart: regular rate and rhythm, S1, S2 normal, no murmur, click, rub or gallop Abdomen: soft, non-tender; bowel sounds normal; no masses,  no organomegaly Extremities: extremities normal, atraumatic, no cyanosis or edema Pulses: 2+ and symmetric Skin: Skin color, texture, turgor normal. No rashes or lesions Neurologic: Grossly normal    Labs on Admission:   Recent Labs  05/14/13 1844  NA 143  K 3.6*  CL 104  CO2 24  GLUCOSE 107*  BUN 9  CREATININE 0.68  CALCIUM 9.1    Recent Labs  05/14/13 1844  AST 21  ALT 13  ALKPHOS 114  BILITOT 0.6  PROT 8.3  ALBUMIN 3.7    Recent Labs  05/14/13 1844  TROPONINI <0.30   Radiological Exams on Admission: Ct Head Wo Contrast  05/14/2013   CLINICAL DATA:  Fall. Blunt trauma to back of head. Dizziness and nausea.  EXAM: CT  HEAD WITHOUT CONTRAST  TECHNIQUE: Contiguous axial images were obtained from the base of the skull through the vertex without intravenous contrast.  COMPARISON:  None.  FINDINGS: There is no evidence of intracranial hemorrhage, brain edema, or other signs of acute infarction. There is no evidence of intracranial mass lesion or mass effect. No abnormal extraaxial fluid collections are identified.  Mild central cerebral atrophy is noted. Persistent cavum septum pellucidum and cavum vergae noted. No evidence hydrocephalus. No evidence of skull fracture or other significant bone abnormality.  IMPRESSION: No acute intracranial abnormality.  Mild cerebral atrophy.   Electronically Signed   By: Myles RosenthalJohn  Stahl M.D.   On: 05/14/2013 14:26   Mr Maxine GlennMra  Head Wo Contrast  05/14/2013   CLINICAL DATA:  Acute onset of dizziness and severe vertigo with falling. Question stroke.  EXAM: MRI HEAD WITHOUT CONTRAST  MRA HEAD WITHOUT CONTRAST  TECHNIQUE: Multiplanar, multiecho pulse sequences of the brain and surrounding structures were obtained without intravenous contrast. Angiographic images of the head were obtained using MRA technique without contrast.  COMPARISON:  None.  FINDINGS: MRI HEAD FINDINGS  Multi focal restricted diffusion is evident within the inferior left cerebellum compatible with an acute nonhemorrhagic left PICA territory infarct. No other acute infarct is present.  No hemorrhage or mass lesion is present. T2 changes are present in association with the infarct. Mild periventricular and subcortical white matter changes are noted as well. Incidental note is made of a persistent cavum septum pellucidum et vergae. Mild generalized atrophy is evident. The ventricles are proportionate to the degree of atrophy. No significant extra-axial fluid collection is present.  The left vertebral artery is occluded. Flow is present in the right vertebral artery. Flow is present in the anterior circulation.  Scleral banding is noted at the right globe. The globes are mildly enlarged bilaterally. The orbits are otherwise intact. A fluid level is present in the right maxillary sinus. Mild mucosal thickening is present in the ethmoid air cells bilaterally, right greater than left. A left mastoid effusion is present. No obstructing nasopharyngeal lesion is evident.  MRA HEAD FINDINGS  The left vertebral artery occlusion is confirmed. This may be an acute dissection. The right vertebral artery is normal. The right PICA origin is within normal limits. The basilar artery is unremarkable. Both posterior cerebral arteries originate from the basilar tip.  The internal carotid arteries are within normal limits from the high cervical segments through the ICA termini bilaterally. The  A1 and M1 segments are normal. The anterior communicating artery is patent. The MCA bifurcations are within normal limits bilaterally. ACA and MCA branch vessels are unremarkable.  IMPRESSION: 1. Acute nonhemorrhagic left PICA territory infarct. 2. Occluded left vertebral artery. This likely represents an acute dissection. 3. Bupthalmos.  Status post banding of the right globe. Critical Value/emergent results were called by telephone at the time of interpretation on 05/14/2013 at 5:50 PM to Dr. Devoria AlbeIVA KNAPP , who verbally acknowledged these results.   Electronically Signed   By: Gennette Pachris  Mattern M.D.   On: 05/14/2013 18:00   Mr Brain Wo Contrast  05/14/2013   CLINICAL DATA:  Acute onset of dizziness and severe vertigo with falling. Question stroke.  EXAM: MRI HEAD WITHOUT CONTRAST  MRA HEAD WITHOUT CONTRAST  TECHNIQUE: Multiplanar, multiecho pulse sequences of the brain and surrounding structures were obtained without intravenous contrast. Angiographic images of the head were obtained using MRA technique without contrast.  COMPARISON:  None.  FINDINGS: MRI HEAD FINDINGS  Multi focal  restricted diffusion is evident within the inferior left cerebellum compatible with an acute nonhemorrhagic left PICA territory infarct. No other acute infarct is present.  No hemorrhage or mass lesion is present. T2 changes are present in association with the infarct. Mild periventricular and subcortical white matter changes are noted as well. Incidental note is made of a persistent cavum septum pellucidum et vergae. Mild generalized atrophy is evident. The ventricles are proportionate to the degree of atrophy. No significant extra-axial fluid collection is present.  The left vertebral artery is occluded. Flow is present in the right vertebral artery. Flow is present in the anterior circulation.  Scleral banding is noted at the right globe. The globes are mildly enlarged bilaterally. The orbits are otherwise intact. A fluid level is  present in the right maxillary sinus. Mild mucosal thickening is present in the ethmoid air cells bilaterally, right greater than left. A left mastoid effusion is present. No obstructing nasopharyngeal lesion is evident.  MRA HEAD FINDINGS  The left vertebral artery occlusion is confirmed. This may be an acute dissection. The right vertebral artery is normal. The right PICA origin is within normal limits. The basilar artery is unremarkable. Both posterior cerebral arteries originate from the basilar tip.  The internal carotid arteries are within normal limits from the high cervical segments through the ICA termini bilaterally. The A1 and M1 segments are normal. The anterior communicating artery is patent. The MCA bifurcations are within normal limits bilaterally. ACA and MCA branch vessels are unremarkable.  IMPRESSION: 1. Acute nonhemorrhagic left PICA territory infarct. 2. Occluded left vertebral artery. This likely represents an acute dissection. 3. Bupthalmos.  Status post banding of the right globe. Critical Value/emergent results were called by telephone at the time of interpretation on 05/14/2013 at 5:50 PM to Dr. Devoria Albe , who verbally acknowledged these results.   Electronically Signed   By: Gennette Pac M.D.   On: 05/14/2013 18:00    Assessment/Plan  58 yo female with acute cva/vertebral artery dissection  Principal Problem:   CVA (cerebral infarction)-  cva pathway with full w/u.  Aspirin/plavix per neuro team.  Neurology team following.    Active Problems:  Stable unless o/w noted   Primary hyperparathyroidism   SLE- she says is very mild case, not on chronic meds for this   Vertebral artery dissection   Vertigo due to cerebrovascular disease   Chronic anemia   Cerebellar stroke  Admit to tele.  ekg nsr no arrythmias.  Full code.  Adamariz Gillott A Onalee Hua 05/14/2013, 8:22 PM

## 2013-05-14 NOTE — ED Provider Notes (Signed)
CSN: [id number]     Arrival date & time 05/14/13  1200 History   First MD Initiated Contact with Patient 05/14/13 1204     Chief Complaint  Patient presents with  . Dizziness     (Consider location/radiation/quality/duration/timing/severity/associated sxs/prior Treatment) HPI Patient reports yesterday she started having a feeling of spinning sensation when she stood up and having a feeling she's falling. 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 She states she did lose her balance and fell back hitting the back of her head. She denies loss of consciousness. She states she's had severe nausea and has had vomiting multiple times about 4-5. She states opening her eyes are standing up makes the dizziness worse. She states she has a headache now however she did not have one until she fell and hit her head. She denies any neck pain. She states she keeps feeling like she is falling to her left. She states she's never had this before.  PCP none  Past Medical History  Diagnosis Date  . Lupus   . Spasmodic dysphonia   . Chronic anemia   . Kidney stones   . Retinal detachment    Past Surgical History  Procedure Laterality Date  . Abdominal hysterectomy    . Thyroid surgery    . Knee surgery Left    Family History  Problem Relation Age of Onset  . Cancer Mother     liver  . Cancer Father     prostate  . Diabetes Mother    History  Substance Use Topics  . Smoking status: Never Smoker   . Smokeless tobacco: Not on file  . Alcohol Use: No   Employed as a Software engineer  OB History   Grav Para Term Preterm Abortions TAB SAB Ect Mult Living                 Review of Systems  All other systems reviewed and are negative.     Allergies  Hydroxychloroquine sulfate  Home Medications   Prior to Admission medications   Not on File    BP 147/76  Pulse 63  Temp(Src) 97.6 F (36.4 C) (Oral)  Resp 16  Ht 5\' 4"  (1.626 m)  Wt 180 lb (81.647 kg)  BMI 30.88 kg/m2  SpO2 99%  Vital signs normal   Physical Exam  Nursing note and vitals reviewed. Constitutional: She is oriented to person, place, and time. She appears well-developed and well-nourished.  Non-toxic appearance. She does not appear ill. She appears distressed.  HENT:  Head: Normocephalic and atraumatic.  Right Ear: External ear normal.  Left Ear: External ear normal.  Nose: Nose normal. No mucosal edema or rhinorrhea.  Mouth/Throat: Mucous membranes are normal. No dental abscesses or uvula swelling. Oropharyngeal exudate present.  Dry tongue  Eyes: Conjunctivae and EOM are normal. Pupils are equal, round, and reactive to light.  Keeps her eyes closed, when she opens them she has marked horizontal nystagymus  Neck: Normal range of motion and full passive range of motion without pain. Neck supple.  Cardiovascular: Normal rate, regular rhythm and normal heart sounds.  Exam reveals no gallop and no friction rub.   No murmur heard. Pulmonary/Chest: Effort normal and breath sounds normal. No respiratory distress. She has no wheezes. She has no rhonchi. She has no  rales. She exhibits no tenderness and no crepitus.  Breathing hard, but denies SOB  Abdominal: Soft. Normal appearance and bowel sounds are normal. She exhibits no distension. There is no tenderness. There is no rebound and no guarding.  Musculoskeletal: Normal range of motion. She exhibits no edema and no tenderness.  Moves all extremities well.   Neurological: She is alert and oriented to person, place, and time. She has normal strength. No cranial nerve deficit.  Skin: Skin is warm, dry and intact. No rash noted. No erythema. No pallor.  Psychiatric: Her speech is normal and behavior is normal. Her affect is blunt.    ED Course  Procedures (including critical care time)  Medications  0.9 %   sodium chloride infusion (0 mLs Intravenous Stopped 05/14/13 1330)    Followed by  0.9 %  sodium chloride infusion (1,000 mLs Intravenous New Bag/Given 05/14/13 1237)  aspirin tablet 325 mg (325 mg Oral Given 05/14/13 1831)  clopidogrel (PLAVIX) tablet 75 mg (75 mg Oral Given 05/14/13 1831)  metoCLOPramide (REGLAN) 20 mg in dextrose 5 % 50 mL IVPB (20 mg Intravenous Given 05/14/13 1338)  diphenhydrAMINE (BENADRYL) injection 50 mg (50 mg Intravenous Given 05/14/13 1231)  LORazepam (ATIVAN) injection 1 mg (1 mg Intravenous Given 05/14/13 1231)  meclizine (ANTIVERT) tablet 25 mg (25 mg Oral Given 05/14/13 1228)  meclizine (ANTIVERT) tablet 25 mg (25 mg Oral Given 05/14/13 1529)  LORazepam (ATIVAN) injection 1 mg (1 mg Intravenous Given 05/14/13 1529)  ondansetron (ZOFRAN) injection 4 mg (4 mg Intravenous Given 05/14/13 1825)   Patient continued to have severe vertigo-type symptoms. Her medications were repeated. After her CT came back normal we discussed possible stroke and need for MRI. Patient is agreeable.  17:22 Dr Thad Rangereynolds, will look at MRI/MRA when available, if has a stroke can be admitted to medicine.   17:48 Dr Alfredo BattyMattern, states she has PICA infarcts in her left cerebellum with possible left vertebral artery dissection.   Dr Thad Rangereynolds has seen patient and talked to her daughters, wants medicine to admit.   19:51 Dr Onalee Huaavid admit to tele, team 10   Labs Review Results for orders placed during the hospital encounter of 05/14/13  COMPREHENSIVE METABOLIC PANEL      Result Value Ref Range   Sodium 143  137 - 147 mEq/L   Potassium 3.6 (*) 3.7 - 5.3 mEq/L   Chloride 104  96 - 112 mEq/L   CO2 24  19 - 32 mEq/L   Glucose, Bld 107 (*) 70 - 99 mg/dL   BUN 9  6 - 23 mg/dL   Creatinine, Ser 1.610.68  0.50 - 1.10 mg/dL   Calcium 9.1  8.4 - 09.610.5 mg/dL   Total Protein 8.3  6.0 - 8.3 g/dL   Albumin 3.7  3.5 - 5.2 g/dL   AST 21  0 - 37 U/L   ALT 13  0 - 35 U/L   Alkaline Phosphatase 114  39 - 117 U/L    Total Bilirubin 0.6  0.3 - 1.2 mg/dL   GFR calc non Af Amer >90  >90 mL/min   GFR calc Af Amer >90  >90 mL/min  TROPONIN I      Result Value Ref Range   Troponin I <0.30  <0.30 ng/mL  PROTIME-INR      Result Value Ref Range   Prothrombin Time 12.8  11.6 - 15.2 seconds   INR 0.98  0.00 - 1.49  CBC WITH DIFFERENTIAL      Result  Value Ref Range   WBC 3.9 (*) 4.0 - 10.5 K/uL   RBC 4.65  3.87 - 5.11 MIL/uL   Hemoglobin 13.6  12.0 - 15.0 g/dL   HCT 16.141.4  09.636.0 - 04.546.0 %   MCV 89.0  78.0 - 100.0 fL   MCH 29.2  26.0 - 34.0 pg   MCHC 32.9  30.0 - 36.0 g/dL   RDW 40.913.7  81.111.5 - 91.415.5 %   Platelets 123 (*) 150 - 400 K/uL   Neutrophils Relative % 64  43 - 77 %   Neutro Abs 2.5  1.7 - 7.7 K/uL   Lymphocytes Relative 29  12 - 46 %   Lymphs Abs 1.1  0.7 - 4.0 K/uL   Monocytes Relative 7  3 - 12 %   Monocytes Absolute 0.3  0.1 - 1.0 K/uL   Eosinophils Relative 0  0 - 5 %   Eosinophils Absolute 0.0  0.0 - 0.7 K/uL   Basophils Relative 0  0 - 1 %   Basophils Absolute 0.0  0.0 - 0.1 K/uL   Laboratory interpretation all normal except mild hypokalemia, low total white blood cell count without neutropenia, mild thrombocytopenia     Imaging Review Ct Head Wo Contrast  05/14/2013   CLINICAL DATA:  Fall. Blunt trauma to back of head. Dizziness and nausea.  EXAM: CT HEAD WITHOUT CONTRAST  TECHNIQUE: Contiguous axial images were obtained from the base of the skull through the vertex without intravenous contrast.  COMPARISON:  None.  FINDINGS: There is no evidence of intracranial hemorrhage, brain edema, or other signs of acute infarction. There is no evidence of intracranial mass lesion or mass effect. No abnormal extraaxial fluid collections are identified.  Mild central cerebral atrophy is noted. Persistent cavum septum pellucidum and cavum vergae noted. No evidence hydrocephalus. No evidence of skull fracture or other significant bone abnormality.  IMPRESSION: No acute intracranial abnormality.  Mild  cerebral atrophy.   Electronically Signed   By: Myles RosenthalJohn  Stahl M.D.   On: 05/14/2013 14:26   Mr Maxine GlennMra Head Wo Contrast Mr Brain Wo Contrast  05/14/2013   CLINICAL DATA:  Acute onset of dizziness and severe vertigo with falling. Question stroke.  EXAM: MRI HEAD WITHOUT CONTRAST  MRA HEAD WITHOUT CONTRAST  TECHNIQUE: Multiplanar, multiecho pulse sequences of the brain and surrounding structures were obtained without intravenous contrast. Angiographic images of the head were obtained using MRA technique without contrast.  COMPARISON:  None.  FINDINGS: MRI HEAD FINDINGS  Multi focal restricted diffusion is evident within the inferior left cerebellum compatible with an acute nonhemorrhagic left PICA territory infarct. No other acute infarct is present.  No hemorrhage or mass lesion is present. T2 changes are present in association with the infarct. Mild periventricular and subcortical white matter changes are noted as well. Incidental note is made of a persistent cavum septum pellucidum et vergae. Mild generalized atrophy is evident. The ventricles are proportionate to the degree of atrophy. No significant extra-axial fluid collection is present.  The left vertebral artery is occluded. Flow is present in the right vertebral artery. Flow is present in the anterior circulation.  Scleral banding is noted at the right globe. The globes are mildly enlarged bilaterally. The orbits are otherwise intact. A fluid level is present in the right maxillary sinus. Mild mucosal thickening is present in the ethmoid air cells bilaterally, right greater than left. A left mastoid effusion is present. No obstructing nasopharyngeal lesion is evident.  MRA HEAD FINDINGS  The left vertebral artery occlusion is confirmed. This may be an acute dissection. The right vertebral artery is normal. The right PICA origin is within normal limits. The basilar artery is unremarkable. Both posterior cerebral arteries originate from the basilar tip.  The  internal carotid arteries are within normal limits from the high cervical segments through the ICA termini bilaterally. The A1 and M1 segments are normal. The anterior communicating artery is patent. The MCA bifurcations are within normal limits bilaterally. ACA and MCA branch vessels are unremarkable.  IMPRESSION: 1. Acute nonhemorrhagic left PICA territory infarct. 2. Occluded left vertebral artery. This likely represents an acute dissection. 3. Bupthalmos.  Status post banding of the right globe. Critical Value/emergent results were called by telephone at the time of interpretation on 05/14/2013 at 5:50 PM to Dr. Devoria Albe , who verbally acknowledged these results.   Electronically Signed   By: Gennette Pac M.D.   On: 05/14/2013 18:00     EKG Interpretation   Date/Time:  Sunday May 14 2013 12:20:06 EDT Ventricular Rate:  68 PR Interval:  139 QRS Duration: 82 QT Interval:  446 QTC Calculation: 474 R Axis:   86 Text Interpretation:  Sinus arrhythmia Since last tracing 03 Apr 1999  Premature atrial complexes Rightward axis Right atrial enlargement  Consider left ventricular hypertrophy Borderline prolonged QT interval  Baseline wander Confirmed by Hao Dion  MD-I, Ximena Todaro ([id number]) on 05/14/2013  12:25:37 PM      MDM   Final diagnoses:  Cerebellar stroke    Plan admission   Devoria Albe, MD, Franz Dell, MD 05/14/13 [phone]

## 2013-05-15 DIAGNOSIS — I6350 Cerebral infarction due to unspecified occlusion or stenosis of unspecified cerebral artery: Secondary | ICD-10-CM

## 2013-05-15 DIAGNOSIS — I519 Heart disease, unspecified: Secondary | ICD-10-CM

## 2013-05-15 DIAGNOSIS — M899 Disorder of bone, unspecified: Secondary | ICD-10-CM

## 2013-05-15 DIAGNOSIS — E785 Hyperlipidemia, unspecified: Secondary | ICD-10-CM | POA: Diagnosis present

## 2013-05-15 DIAGNOSIS — M949 Disorder of cartilage, unspecified: Secondary | ICD-10-CM

## 2013-05-15 DIAGNOSIS — I10 Essential (primary) hypertension: Secondary | ICD-10-CM | POA: Diagnosis present

## 2013-05-15 LAB — LIPID PANEL
Cholesterol: 167 mg/dL (ref 0–200)
HDL: 31 mg/dL — ABNORMAL LOW (ref >39)
LDL Cholesterol: 120 mg/dL — ABNORMAL HIGH (ref 0–99)
Total CHOL/HDL Ratio: 5.4 RATIO
Triglycerides: 80 mg/dL (ref <150)
VLDL: 16 mg/dL (ref 0–40)

## 2013-05-15 LAB — HEMOGLOBIN A1C
Hgb A1c MFr Bld: 6.3 % — ABNORMAL HIGH (ref <5.7)
Mean Plasma Glucose: 134 mg/dL — ABNORMAL HIGH (ref <117)

## 2013-05-15 MED ORDER — MECLIZINE HCL 25 MG PO TABS
25.0000 mg | ORAL_TABLET | Freq: Three times a day (TID) | ORAL | Status: DC | PRN
  Filled 2013-05-15: qty 1

## 2013-05-15 MED ORDER — SIMVASTATIN 10 MG PO TABS
10.0000 mg | ORAL_TABLET | Freq: Every day | ORAL | Status: DC
  Administered 2013-05-15: 10 mg via ORAL
  Filled 2013-05-15 (×2): qty 1

## 2013-05-15 NOTE — Evaluation (Addendum)
Physical Therapy Evaluation Patient Details Name: Elaine Allen MRN: [id number] DOB: April 20, 1955 Today's Date: 05/15/2013   History of Present Illness  Pt admitted with vertigo, dizziness and fall with noted Left PICA infarct and L vertebral artery dissection  Clinical Impression  Pt very pleasant and without dizziness or vertigo during mobility. Did not assess mobility with head turns given dissection and await further clearance for increased neck ROM. Pt educated for deficits and need for assist with all mobility presently due to impaired balance and gait. Pt will benefit from acute therapy to maximize balance, gait, independence and function to decrease burden of care and return pt to PLOF.     Follow Up Recommendations CIR    Equipment Recommendations  None recommended by PT    Recommendations for Other Services Rehab consult;OT consult     Precautions / Restrictions Precautions Precautions: Fall      Mobility  Bed Mobility Overal bed mobility: Modified Independent                Transfers Overall transfer level: Needs assistance   Transfers: Sit to/from Stand Sit to Stand: Min assist         General transfer comment: pt with posterior left lean on initial standing  Ambulation/Gait Ambulation/Gait assistance: Min assist Ambulation Distance (Feet): 40 Feet Assistive device: 1 person hand held assist Gait Pattern/deviations: Step-through pattern;Decreased stride length;Drifts right/left;Narrow base of support   Gait velocity interpretation: <1.8 ft/sec, indicative of risk for recurrent falls General Gait Details: pt with left lean and drifting left with unsteady gait and LOB with ambulation  Stairs            Wheelchair Mobility    Modified Rankin (Stroke Patients Only) Modified Rankin (Stroke Patients Only) Pre-Morbid Rankin Score: No symptoms Modified Rankin: Moderately severe disability     Balance Overall balance assessment: Needs  assistance   Sitting balance-Leahy Scale: Good       Standing balance-Leahy Scale: Poor Standing balance comment: pt with left lean in standing with min assist to maintain balance and prevent fall Single Leg Stance - Right Leg: 2 Single Leg Stance - Left Leg: 2     Rhomberg - Eyes Opened: 5                   Pertinent Vitals/Pain No pain HR 87    Home Living Family/patient expects to be discharged to:: Private residence Living Arrangements: Alone Available Help at Discharge: Family;Available PRN/intermittently Type of Home: House Home Access: Stairs to enter   Entrance Stairs-Number of Steps: 2 Home Layout: One level Home Equipment: Walker - 2 wheels      Prior Function Level of Independence: Independent         Comments: Pt works as a Psychologist, clinicalHHRN     Hand Dominance        Extremity/Trunk Assessment   Upper Extremity Assessment: LUE deficits/detail       LUE Deficits / Details: pt with dysmetria with finger to nose but able to perform RAM supination/pronation and finger to thumb without difficulty.    Lower Extremity Assessment: Overall WFL for tasks assessed      Cervical / Trunk Assessment: Normal  Communication   Communication: No difficulties  Cognition Arousal/Alertness: Awake/alert Behavior During Therapy: WFL for tasks assessed/performed Overall Cognitive Status: Impaired/Different from baseline Area of Impairment: Safety/judgement         Safety/Judgement: Decreased awareness of deficits     General Comments: Pt is a HHRN but  was unable to states signs/symptoms of a stroke    General Comments      Exercises        Assessment/Plan    PT Assessment Patient needs continued PT services  PT Diagnosis Abnormality of gait   PT Problem List Decreased balance;Decreased coordination  PT Treatment Interventions Gait training;DME instruction;Functional mobility training;Therapeutic activities;Patient/family education;Neuromuscular  re-education;Balance training   PT Goals (Current goals can be found in the Care Plan section) Acute Rehab PT Goals Patient Stated Goal: be able to return to work PT Goal Formulation: With patient Time For Goal Achievement: 05/29/13 Potential to Achieve Goals: Good    Frequency Min 4X/week   Barriers to discharge Decreased caregiver support      Co-evaluation               End of Session Equipment Utilized During Treatment: Gait belt Activity Tolerance: Patient tolerated treatment well Patient left: in chair;with call bell/phone within reach;with chair alarm set Nurse Communication: Mobility status         Time: 1610-96041309-1329 PT Time Calculation (min): 20 min   Charges:   PT Evaluation $Initial PT Evaluation Tier I: 1 Procedure PT Treatments $Therapeutic Activity: 8-22 mins   PT G Codes:          Elaine Allen 05/15/2013, 1:42 PM Elaine Allen Elaine Allen, PT [phone]

## 2013-05-15 NOTE — Evaluation (Signed)
Occupational Therapy Evaluation Patient Details Name: Elaine MaltaJill L Ewings MRN: [id number] DOB: November 08, 1955 Today's Date: 05/15/2013    History of Present Illness Pt admitted with vertigo, dizziness and fall with noted Left PICA infarct and L vertebral artery dissection   Clinical Impression   Pt admitted with above. Pt presents with below problem list. Pt independent with ADLs, PTA. Feel pt will benefit from acute OT to increase independence prior to d/c.     Follow Up Recommendations  CIR    Equipment Recommendations  Other (comment) (defer to next venue)    Recommendations for Other Services Rehab consult     Precautions / Restrictions Precautions Precautions: Fall      Mobility Bed Mobility Overal bed mobility: Modified Independent                Transfers Overall transfer level: Needs assistance   Transfers: Sit to/from Stand Sit to Stand: Min guard         General transfer comment: Min guard for safety, Cues for technique    Balance                                            ADL Overall ADL's : Needs assistance/impaired     Grooming: Wash/dry hands;Wash/dry face;Oral care;Set up;Supervision/safety;Standing   Upper Body Bathing: Set up;Sitting   Lower Body Bathing: Min guard;Sit to/from stand   Upper Body Dressing : Set up;Sitting   Lower Body Dressing: Min guard;Sit to/from stand   Toilet Transfer: Minimal assistance/Moderate assistance (Min-Mod A for balance without walker);Ambulation;Regular Toilet;Grab bars   Toileting- Clothing Manipulation and Hygiene: Min guard (standing)       Functional mobility during ADLs: Minimal assistance/Moderate assistance;Min guard;Rolling walker (Min A/Mod A without walker-handheld and decreased balance; Min guard with walker) General ADL Comments: Educated on signs/symptoms of stroke and importance of getting help right away. Educated on safe shoe wear. Pt performed grooming at sink and also  toileting tasks. Educated on activities pt can be doing for coordination in left hand. Ambulated in hallway with walker. Pt more steady with walker.      Vision       Tracking/Visual Pursuits: Other (comment) (decreased smoothness; lost pen on left side)             Perception     Praxis      Pertinent Vitals/Pain Headache at beginning of session. No pain at end of session.       Hand Dominance     Extremity/Trunk Assessment Upper Extremity Assessment Upper Extremity Assessment: RUE deficits/detail;LUE deficits/detail RUE Deficits / Details: strength WFL but did not achieve full AROM shoulder flexion without assist-reports previous neck pain on right side but has now resolved. LUE Sensation: decreased light touch LUE Coordination: decreased fine motor;decreased gross motor   Lower Extremity Assessment Lower Extremity Assessment: Defer to PT evaluation       Communication Communication Communication: No difficulties   Cognition Arousal/Alertness: Awake/alert Behavior During Therapy: WFL for tasks assessed/performed Overall Cognitive Status: Within Functional Limits for tasks assessed                     General Comments       Exercises       Shoulder Instructions      Home Living Family/patient expects to be discharged to:: Private residence Living Arrangements: Alone Available Help at Discharge:  Family;Available PRN/intermittently Type of Home: House Home Access: Stairs to enter Entergy CorporationEntrance Stairs-Number of Steps: 2   Home Layout: One level     Bathroom Shower/Tub: Chief Strategy OfficerTub/shower unit   Bathroom Toilet:  (has both)     Home Equipment: Environmental consultantWalker - 2 wheels;Grab bars - tub/shower          Prior Functioning/Environment Level of Independence: Independent        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    OT Diagnosis: Acute pain;Disturbance of vision (decreased balance)   OT Problem List: Decreased strength;Impaired balance (sitting and/or  standing);Decreased coordination;Impaired vision/perception;Decreased knowledge of use of DME or AE;Decreased knowledge of precautions;Pain   OT Treatment/Interventions: Self-care/ADL training;DME and/or AE instruction;Therapeutic activities;Visual/perceptual remediation/compensation;Patient/family education;Balance training;Neuromuscular education;Therapeutic exercise    OT Goals(Current goals can be found in the care plan section) Acute Rehab OT Goals Patient Stated Goal: not stated OT Goal Formulation: With patient Time For Goal Achievement: 05/22/13 Potential to Achieve Goals: Good ADL Goals Pt Will Perform Lower Body Bathing: with modified independence;sit to/from stand Pt Will Perform Lower Body Dressing: with modified independence;sit to/from stand Pt Will Transfer to Toilet: with modified independence;ambulating Pt Will Perform Toileting - Clothing Manipulation and hygiene: with modified independence;sit to/from stand  OT Frequency: Min 2X/week   Barriers to D/C: Decreased caregiver support          Co-evaluation              End of Session Equipment Utilized During Treatment: Gait belt;Rolling walker  Activity Tolerance: Patient tolerated treatment well Patient left: in chair;with call bell/phone within reach;with chair alarm set   Time: [id number] OT Time Calculation (min): 24 min Charges:  OT General Charges $OT Visit: 1 Procedure OT Evaluation $Initial OT Evaluation Tier I: 1 Procedure OT Treatments $Therapeutic Activity: 8-22 mins G-CodesEarlie Raveling:    Madine Sarr L Areal Cochrane OTR/L [phone] 05/15/2013, 6:13 PM

## 2013-05-15 NOTE — Progress Notes (Signed)
Patient ID: Elaine Allen  female  ZOX:[id number]    DOB: 1955/08/03    DOA: 05/14/2013  PCP: Aurora Mask, NP  Assessment/Plan: Principal Problem:   Acute CVA (cerebral infarction) with Vertebral artery dissection - MRI of the brain shows a left PICA infarct with occlusion of the left vertebral artery. Occlusion likely secondary to a dissection. - Placed on aspirin and Plavix, neurology consulted, and follow stroke team recommendations - 2-D echo and carotid Dopplers pending - LDL 120, placed on statins - PT, OT evaluation pending - ESR normal  Active Problems:   SLE    Vertigo due to cerebrovascular disease - Continue meclizine as needed    Chronic anemia- stable    Essential hypertension, benign - Not on any antihypertensives, permissive HTN in acute CVA    Other and unspecified hyperlipidemia - Placed on statins  DVT Prophylaxis: SCDs  Code Status: Full code  Family Communication:  Disposition:  Consultants: Neurology Procedures:  None  Antibiotics:  None    Subjective: Still complaining of some headache, no nausea, vomiting or dizziness this morning, gait not assessed  Objective: Weight change:   Intake/Output Summary (Last 24 hours) at 05/15/13 0918 Last data filed at 05/15/13 0848  Gross per 24 hour  Intake    120 ml  Output      0 ml  Net    120 ml   Blood pressure 164/99, pulse 115, temperature 99 F (37.2 C), temperature source Oral, resp. rate 16, height $RemoveBe'5\' 4"'JxmjeDFRM$  (1.626 m), weight 85.866 kg (189 lb 4.8 oz), SpO2 100.00%.  Physical Exam: General: Alert and awake, oriented x3, not in any acute distress. CVS: S1-S2 clear, no murmur rubs or gallops Chest: clear to auscultation bilaterally, no wheezing, rales or rhonchi Abdomen: soft nontender, nondistended, normal bowel sounds  Extremities: no cyanosis, clubbing or edema noted bilaterally Neuro: Cranial nerves II-XII intact, no dysarthria, Dysmetria with finger-to-nose testing on the  left. Normal heel-to-shin testing, strength 5/5 in right in left upper and lower extremities   Lab Results: Basic Metabolic Panel:  Recent Labs Lab 05/14/13 1844  NA 143  K 3.6*  CL 104  CO2 24  GLUCOSE 107*  BUN 9  CREATININE 0.68  CALCIUM 9.1   Liver Function Tests:  Recent Labs Lab 05/14/13 1844  AST 21  ALT 13  ALKPHOS 114  BILITOT 0.6  PROT 8.3  ALBUMIN 3.7   No results found for this basename: LIPASE, AMYLASE,  in the last 168 hours No results found for this basename: AMMONIA,  in the last 168 hours CBC:  Recent Labs Lab 05/14/13 1913  WBC 3.9*  NEUTROABS 2.5  HGB 13.6  HCT 41.4  MCV 89.0  PLT 123*   Cardiac Enzymes:  Recent Labs Lab 05/14/13 1844  TROPONINI <0.30   BNP: No components found with this basename: POCBNP,  CBG: No results found for this basename: GLUCAP,  in the last 168 hours   Micro Results: No results found for this or any previous visit (from the past 240 hour(s)).  Studies/Results: Ct Head Wo Contrast  05/14/2013   CLINICAL DATA:  Fall. Blunt trauma to back of head. Dizziness and nausea.  EXAM: CT HEAD WITHOUT CONTRAST  TECHNIQUE: Contiguous axial images were obtained from the base of the skull through the vertex without intravenous contrast.  COMPARISON:  None.  FINDINGS: There is no evidence of intracranial hemorrhage, brain edema, or other signs of acute infarction. There is no evidence of intracranial mass lesion or  mass effect. No abnormal extraaxial fluid collections are identified.  Mild central cerebral atrophy is noted. Persistent cavum septum pellucidum and cavum vergae noted. No evidence hydrocephalus. No evidence of skull fracture or other significant bone abnormality.  IMPRESSION: No acute intracranial abnormality.  Mild cerebral atrophy.   Electronically Signed   By: Earle Gell M.D.   On: 05/14/2013 14:26   Mr Jodene Nam Head Wo Contrast  05/14/2013   CLINICAL DATA:  Acute onset of dizziness and severe vertigo with  falling. Question stroke.  EXAM: MRI HEAD WITHOUT CONTRAST  MRA HEAD WITHOUT CONTRAST  TECHNIQUE: Multiplanar, multiecho pulse sequences of the brain and surrounding structures were obtained without intravenous contrast. Angiographic images of the head were obtained using MRA technique without contrast.  COMPARISON:  None.  FINDINGS: MRI HEAD FINDINGS  Multi focal restricted diffusion is evident within the inferior left cerebellum compatible with an acute nonhemorrhagic left PICA territory infarct. No other acute infarct is present.  No hemorrhage or mass lesion is present. T2 changes are present in association with the infarct. Mild periventricular and subcortical white matter changes are noted as well. Incidental note is made of a persistent cavum septum pellucidum et vergae. Mild generalized atrophy is evident. The ventricles are proportionate to the degree of atrophy. No significant extra-axial fluid collection is present.  The left vertebral artery is occluded. Flow is present in the right vertebral artery. Flow is present in the anterior circulation.  Scleral banding is noted at the right globe. The globes are mildly enlarged bilaterally. The orbits are otherwise intact. A fluid level is present in the right maxillary sinus. Mild mucosal thickening is present in the ethmoid air cells bilaterally, right greater than left. A left mastoid effusion is present. No obstructing nasopharyngeal lesion is evident.  MRA HEAD FINDINGS  The left vertebral artery occlusion is confirmed. This may be an acute dissection. The right vertebral artery is normal. The right PICA origin is within normal limits. The basilar artery is unremarkable. Both posterior cerebral arteries originate from the basilar tip.  The internal carotid arteries are within normal limits from the high cervical segments through the ICA termini bilaterally. The A1 and M1 segments are normal. The anterior communicating artery is patent. The MCA bifurcations  are within normal limits bilaterally. ACA and MCA branch vessels are unremarkable.  IMPRESSION: 1. Acute nonhemorrhagic left PICA territory infarct. 2. Occluded left vertebral artery. This likely represents an acute dissection. 3. Bupthalmos.  Status post banding of the right globe. Critical Value/emergent results were called by telephone at the time of interpretation on 05/14/2013 at 5:50 PM to Dr. Rolland Porter , who verbally acknowledged these results.   Electronically Signed   By: Lawrence Santiago M.D.   On: 05/14/2013 18:00   Mr Brain Wo Contrast  05/14/2013   CLINICAL DATA:  Acute onset of dizziness and severe vertigo with falling. Question stroke.  EXAM: MRI HEAD WITHOUT CONTRAST  MRA HEAD WITHOUT CONTRAST  TECHNIQUE: Multiplanar, multiecho pulse sequences of the brain and surrounding structures were obtained without intravenous contrast. Angiographic images of the head were obtained using MRA technique without contrast.  COMPARISON:  None.  FINDINGS: MRI HEAD FINDINGS  Multi focal restricted diffusion is evident within the inferior left cerebellum compatible with an acute nonhemorrhagic left PICA territory infarct. No other acute infarct is present.  No hemorrhage or mass lesion is present. T2 changes are present in association with the infarct. Mild periventricular and subcortical white matter changes are noted as well.  Incidental note is made of a persistent cavum septum pellucidum et vergae. Mild generalized atrophy is evident. The ventricles are proportionate to the degree of atrophy. No significant extra-axial fluid collection is present.  The left vertebral artery is occluded. Flow is present in the right vertebral artery. Flow is present in the anterior circulation.  Scleral banding is noted at the right globe. The globes are mildly enlarged bilaterally. The orbits are otherwise intact. A fluid level is present in the right maxillary sinus. Mild mucosal thickening is present in the ethmoid air cells  bilaterally, right greater than left. A left mastoid effusion is present. No obstructing nasopharyngeal lesion is evident.  MRA HEAD FINDINGS  The left vertebral artery occlusion is confirmed. This may be an acute dissection. The right vertebral artery is normal. The right PICA origin is within normal limits. The basilar artery is unremarkable. Both posterior cerebral arteries originate from the basilar tip.  The internal carotid arteries are within normal limits from the high cervical segments through the ICA termini bilaterally. The A1 and M1 segments are normal. The anterior communicating artery is patent. The MCA bifurcations are within normal limits bilaterally. ACA and MCA branch vessels are unremarkable.  IMPRESSION: 1. Acute nonhemorrhagic left PICA territory infarct. 2. Occluded left vertebral artery. This likely represents an acute dissection. 3. Bupthalmos.  Status post banding of the right globe. Critical Value/emergent results were called by telephone at the time of interpretation on 05/14/2013 at 5:50 PM to Dr. Rolland Porter , who verbally acknowledged these results.   Electronically Signed   By: Lawrence Santiago M.D.   On: 05/14/2013 18:00    Medications: Scheduled Meds: . aspirin  325 mg Oral Daily  . clopidogrel  75 mg Oral Q breakfast  . simvastatin  10 mg Oral q1800  . sodium chloride  3 mL Intravenous Q12H      LOS: 1 day   Rafiq Bucklin Krystal Eaton M.D. [facility] 05/15/2013, 9:18 AM Pager: [phone]  If 7PM-7AM, please contact night-coverage [email or web address] Password [email or web address]  **Disclaimer: This note was dictated with voice recognition software. Similar sounding words can inadvertently be transcribed and this note may contain transcription errors which may not have been corrected upon publication of note.**

## 2013-05-15 NOTE — Progress Notes (Signed)
Rehab Admissions Coordinator Note:  Patient was screened by Trish MageEugenia M Sanari Offner for appropriateness for an Inpatient Acute Rehab Consult.  At this time, we are recommending Inpatient Rehab consult. The consult has already been ordered and is pending.  Ellouise Newerugenia M Lashawnta Burgert 05/15/2013, 3:47 PM  I can be reached at [phone].

## 2013-05-15 NOTE — Progress Notes (Signed)
Utilization review completed.  

## 2013-05-15 NOTE — Progress Notes (Signed)
  Echocardiogram 2D Echocardiogram has been performed.  Arvil ChacoRachel Foster 05/15/2013, 3:05 PM

## 2013-05-15 NOTE — Progress Notes (Signed)
VASCULAR LAB PRELIMINARY  PRELIMINARY  PRELIMINARY  PRELIMINARY  Carotid duplex completed.    Preliminary report: 1-39% stenosis noted bilaterally.  Right vertebral artery flow antegrade.  Lt vertebral artery flow antegrade with spiked waveform suggestive of distal vertebral artery occlusion.   Elaine ScotlandFrances G Myleen Brailsford, RVT 05/15/2013, 2:57 PM

## 2013-05-15 NOTE — Progress Notes (Addendum)
Stroke Team Progress Note  HISTORY Elaine Allen is a 58 y.o. female who noted her symptoms first on Wednesday at 1400 when she had acute onset vertigo while grocery shopping with her client. Her symptoms resolved spontaneously and relatively quickly and the patient returned to baseline. On Friday the patient woke up with a feeling of being hot, nausea, vomiting and vertigo. She also felt that she was falling to the left. This lasted the entire day and when she woke up on Saturday her symptoms were worse. They lasted a couple of hours then resolved completely. The patient went to bed normal. When she awakened 05/14/2013 the patient again had nausea, vomiting and vertigo. She lost her balance and fell backward hitting her head. Her client became concerned and EMS was called. Patient was brought in for evaluation.   Date last known well: Date: 05/13/2013  Time last known well: Time: 21:30  tPA Given: No: Outside time window   SUBJECTIVE N members present this morning. The patient voices no new complaints.  OBJECTIVE Most recent Vital Signs: Filed Vitals:   05/15/13 0000 05/15/13 0400 05/15/13 0600 05/15/13 0700  BP: 141/73 138/73 147/84 164/99  Pulse: 70 73 69 115  Temp:  99 F (37.2 C)    TempSrc:      Resp:  16 16   Height:      Weight:      SpO2: 93% 99% 96% 100%   CBG (last 3)  No results found for this basename: GLUCAP,  in the last 72 hours  IV Fluid Intake:     MEDICATIONS  . aspirin  325 mg Oral Daily  . clopidogrel  75 mg Oral Q breakfast  . sodium chloride  3 mL Intravenous Q12H   PRN:  sodium chloride, ondansetron (ZOFRAN) IV, sodium chloride  Diet:  Dysphagia 1 diet with thin liquids Activity:   Up with assistance DVT Prophylaxis:  SCDs  CLINICALLY SIGNIFICANT STUDIES Basic Metabolic Panel:  Recent Labs Lab 05/14/13 1844  NA 143  K 3.6*  CL 104  CO2 24  GLUCOSE 107*  BUN 9  CREATININE 0.68  CALCIUM 9.1   Liver Function Tests:  Recent Labs Lab  05/14/13 1844  AST 21  ALT 13  ALKPHOS 114  BILITOT 0.6  PROT 8.3  ALBUMIN 3.7   CBC:  Recent Labs Lab 05/14/13 1913  WBC 3.9*  NEUTROABS 2.5  HGB 13.6  HCT 41.4  MCV 89.0  PLT 123*   Coagulation:  Recent Labs Lab 05/14/13 1844 05/14/13 2007  LABPROT 12.8 13.2  INR 0.98 1.02   Cardiac Enzymes:  Recent Labs Lab 05/14/13 1844  TROPONINI <0.30   Urinalysis: No results found for this basename: COLORURINE, APPERANCEUR, LABSPEC, PHURINE, GLUCOSEU, HGBUR, BILIRUBINUR, KETONESUR, PROTEINUR, UROBILINOGEN, NITRITE, LEUKOCYTESUR,  in the last 168 hours Lipid Panel    Component Value Date/Time   CHOL 167 05/15/2013 0350   TRIG 80 05/15/2013 0350   HDL 31* 05/15/2013 0350   CHOLHDL 5.4 05/15/2013 0350   VLDL 16 05/15/2013 0350   LDLCALC 120* 05/15/2013 0350   HgbA1C  No results found for this basename: HGBA1C    Urine Drug Screen:   No results found for this basename: labopia, cocainscrnur, labbenz, amphetmu, thcu, labbarb    Alcohol Level: No results found for this basename: ETH,  in the last 168 hours  Ct Head Wo Contrast 05/14/2013    No acute intracranial abnormality.  Mild cerebral atrophy.    [facility] Wo  Contrast 05/14/2013    1. Acute nonhemorrhagic left PICA territory infarct. 2. Occluded left vertebral artery. This likely represents an acute dissection. 3. Bupthalmos.  Status post banding of the right globe.     2D Echocardiogram  pending  Carotid Doppler Preliminary report: 1-39% stenosis noted bilaterally. Right vertebral artery flow antegrade. Lt vertebral artery flow antegrade with spiked waveform suggestive of distal vertebral artery occlusion.   CXR    EKG  sinus arrhythmia rate 68 beats per minute. For complete results please see formal report.   Therapy Recommendations - inpatient rehabilitation recommended  Physical Exam    Mental Status:  Alert, oriented, thought content appropriate. Speech fluent without evidence of aphasia. Able to  follow 3 step commands without difficulty.  Cranial Nerves:  II: Discs flat bilaterally; Visual fields grossly normal, pupils equal, round, reactive to light and accommodation .saccadic dysmetria on lateral gaze III,IV, VI: ptosis not present, extra-ocular motions intact bilaterally with horizontal nystagmus in all quadrants-worse on looking to the left  V,VII: smile symmetric, facial light touch sensation normal bilaterally  VIII: hearing normal bilaterally  IX,X: gag reflex present  XI: bilateral shoulder shrug  XII: midline tongue extension  Motor:  Right : Upper extremity 5/5 Left: Upper extremity 5-/5  Lower extremity 5/5 Lower extremity 5-/5  Tone and bulk:normal tone throughout; no atrophy noted  Sensory: Pinprick and light touch intact throughout, bilaterally  Deep Tendon Reflexes: 2+ and symmetric throughout  Plantars:  Right: downgoing Left: downgoing  Cerebellar:  Dysmetria with finger-to-nose testing on the left. Normal heel-to-shin testing  Gait: Unable to test   ASSESSMENT Elaine Allen is a 58 y.o. female presenting with vertigo and balance problems. TPA was not initiated secondary to late presentation. MRI  - acute nonhemorrhagic left PICA territory infarct. 2. Occluded left vertebral artery. This likely represents an acute dissection. Infarct felt to be thromboembolic secondary to dissection. On no antithrombotics prior to admission. Now on aspirin 325 mg orally every day and clopidogrel 75 mg orally every day for secondary stroke prevention. Patient with resultant balance problems and difficulty controlling left upper extremity. Stroke work up underway.   Lupus  Chronic anemia  Hyperlipidemia - cholesterol 167 LDL 120    Hospital day # 1  TREATMENT/PLAN  Continue aspirin 325 mg orally every day and clopidogrel 75 mg orally every day for secondary stroke prevention.  Inpatient rehabilitation recommended  Await 2-D echo   Zocor added for elevated  LDL     Delton Seeavid Rinehuls PA-C [facility] Pager [phone] 05/15/2013, 8:21 AM  I have personally obtained a history, examined the patient, evaluated imaging results, and formulated the assessment and plan of care. I agree with the above. Delia HeadyPramod Jeris Roser, MD   To contact Stroke Continuity provider, please refer to [email or web address]. After hours, contact General Neurology

## 2013-05-15 NOTE — Evaluation (Signed)
Speech Language Pathology Evaluation Patient Details Name: Elaine Allen MRN: [id number] DOB: [birth year] Today's Date: 05/15/2013 Time: 1139-1200 SLP Time Calculation (min): 21 min  Problem List:  Patient Active Problem List   Diagnosis Date Noted  . Essential hypertension, benign 05/15/2013  . Other and unspecified hyperlipidemia 05/15/2013  . CVA (cerebral infarction) 05/14/2013  . Vertebral artery dissection 05/14/2013  . Vertigo due to cerebrovascular disease 05/14/2013  . Cerebellar stroke 05/14/2013  . Chronic anemia   . Other tear of cartilage or meniscus of knee, current 01/26/2008  . OSTEOARTHRITIS, KNEE, RIGHT, SEVERE 12/19/2007  . Primary hyperparathyroidism 11/24/2007  . HYPERCALCEMIA 11/24/2007  . KNEE PAIN, RIGHT 11/24/2007  . OSTEOPENIA 11/24/2007  . SLE 01/05/2002   Past Medical History:  Past Medical History  Diagnosis Date  . Lupus   . Spasmodic dysphonia   . Chronic anemia   . Kidney stones   . Retinal detachment    Past Surgical History:  Past Surgical History  Procedure Laterality Date  . Abdominal hysterectomy    . Thyroid surgery    . Knee surgery Left    HPI:  58 year old female admitted with several days of dizziness. Diagnosed with acute nonhemorrhagic left PICA territory infarct. PMH of smasmodic dysphonia.    Assessment / Plan / Recommendation Clinical Impression  Cognitive-linguistic evaluation complete. Patient presents with impairements in the areas of high level problem solving/reasoning. Overall, cognition functional for carry out of basic daily activities. Receptive to moderate clinician cueing for improvements overall. Patient will benefit from acute f/u to monitor for improvements as well as likely f/u after d/c. Recommend CIR consult.     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)   Cognition  Overall Cognitive Status: Impaired/Different from baseline Arousal/Alertness: Awake/alert Orientation Level: Oriented X4 Attention: Sustained Sustained Attention: Appears intact Memory: Impaired Memory Impairment: Retrieval deficit;Decreased short term memory (during formal task, functionally appears appropriate) Decreased Short Term Memory: Verbal basic Awareness: Appears intact Problem Solving: Impaired Problem Solving Impairment: Verbal complex Executive Function: Reasoning Reasoning: Impaired Reasoning Impairment: Verbal complex Safety/Judgment: Appears intact    Comprehension  Auditory Comprehension Overall Auditory Comprehension: Appears within functional limits for tasks assessed Visual Recognition/Discrimination Discrimination: Within Function Limits Reading Comprehension Reading Status: Within funtional limits    Expression Expression Primary Mode of Expression: Verbal Verbal Expression Overall Verbal Expression: Appears within functional limits for tasks assessed   Oral / Motor Oral Motor/Sensory Function Overall Oral Motor/Sensory Function: Appears within functional limits for tasks assessed Motor Speech Overall Motor Speech: Impaired at baseline (spasmodic dysphonia)   GO    Ferdinand LangoLeah Keyanah Kozicki MA, CCC-SLP [phone]  Elaine Allen Elaine Allen Elaine Allen 05/15/2013, 1:13 PM

## 2013-05-16 DIAGNOSIS — I69993 Ataxia following unspecified cerebrovascular disease: Secondary | ICD-10-CM

## 2013-05-16 DIAGNOSIS — I6330 Cerebral infarction due to thrombosis of unspecified cerebral artery: Secondary | ICD-10-CM

## 2013-05-16 DIAGNOSIS — I6350 Cerebral infarction due to unspecified occlusion or stenosis of unspecified cerebral artery: Secondary | ICD-10-CM

## 2013-05-16 MED ORDER — BENZONATATE 100 MG PO CAPS
100.0000 mg | ORAL_CAPSULE | Freq: Three times a day (TID) | ORAL | Status: DC
  Administered 2013-05-16: 100 mg via ORAL
  Filled 2013-05-16 (×3): qty 1

## 2013-05-16 MED ORDER — FLUTICASONE PROPIONATE 50 MCG/ACT NA SUSP: 1.0000 | Freq: Every day | NASAL | Status: DC

## 2013-05-16 MED ORDER — BENZONATATE 100 MG PO CAPS: 100.0000 mg | ORAL_CAPSULE | Freq: Three times a day (TID) | ORAL | Status: DC | PRN

## 2013-05-16 MED ORDER — MECLIZINE HCL 25 MG PO TABS: 25.0000 mg | ORAL_TABLET | Freq: Three times a day (TID) | ORAL | Status: DC | PRN

## 2013-05-16 MED ORDER — UNABLE TO FIND: Status: DC

## 2013-05-16 MED ORDER — GUAIFENESIN-CODEINE 100-10 MG/5ML PO SOLN: 5.0000 mL | Freq: Four times a day (QID) | ORAL | Status: DC | PRN

## 2013-05-16 MED ORDER — DOCUSATE SODIUM 100 MG PO CAPS
100.0000 mg | ORAL_CAPSULE | Freq: Two times a day (BID) | ORAL | Status: DC
  Administered 2013-05-16: 100 mg via ORAL
  Filled 2013-05-16 (×2): qty 1

## 2013-05-16 MED ORDER — AMLODIPINE BESYLATE 5 MG PO TABS: 5.0000 mg | ORAL_TABLET | Freq: Every day | ORAL | Status: DC

## 2013-05-16 MED ORDER — CLOPIDOGREL BISULFATE 75 MG PO TABS: 75.0000 mg | ORAL_TABLET | Freq: Every day | ORAL | Status: DC

## 2013-05-16 MED ORDER — AMLODIPINE BESYLATE 5 MG PO TABS
5.0000 mg | ORAL_TABLET | Freq: Every day | ORAL | Status: DC
Start: 2013-05-16 — End: 2013-05-16
  Filled 2013-05-16: qty 1

## 2013-05-16 MED ORDER — FLUTICASONE PROPIONATE 50 MCG/ACT NA SUSP
1.0000 | Freq: Every day | NASAL | Status: DC
  Filled 2013-05-16: qty 16

## 2013-05-16 MED ORDER — ASPIRIN 325 MG PO TABS: 325.0000 mg | ORAL_TABLET | Freq: Every day | ORAL | Status: DC

## 2013-05-16 MED ORDER — AZITHROMYCIN 500 MG PO TABS: 500.0000 mg | ORAL_TABLET | Freq: Every day | ORAL | Status: DC

## 2013-05-16 MED ORDER — POLYETHYLENE GLYCOL 3350 17 G PO PACK
17.0000 g | PACK | Freq: Every day | ORAL | Status: DC
  Administered 2013-05-16: 17 g via ORAL
  Filled 2013-05-16: qty 1

## 2013-05-16 MED ORDER — DSS 100 MG PO CAPS: 100.0000 mg | ORAL_CAPSULE | Freq: Two times a day (BID) | ORAL | Status: DC

## 2013-05-16 MED ORDER — SIMVASTATIN 10 MG PO TABS: 10.0000 mg | ORAL_TABLET | Freq: Every day | ORAL | Status: DC

## 2013-05-16 MED ORDER — AZITHROMYCIN 500 MG PO TABS
500.0000 mg | ORAL_TABLET | Freq: Every day | ORAL | Status: DC
  Administered 2013-05-16: 500 mg via ORAL
  Filled 2013-05-16: qty 1

## 2013-05-16 MED ORDER — BISACODYL 10 MG RE SUPP
10.0000 mg | Freq: Once | RECTAL | Status: AC
  Administered 2013-05-16: 10 mg via RECTAL
  Filled 2013-05-16: qty 1

## 2013-05-16 MED ORDER — POLYETHYLENE GLYCOL 3350 17 G PO PACK: 17.0000 g | PACK | Freq: Every day | ORAL | Status: DC | PRN

## 2013-05-16 MED ORDER — ACETAMINOPHEN 325 MG PO TABS
650.0000 mg | ORAL_TABLET | Freq: Four times a day (QID) | ORAL | Status: DC | PRN
  Administered 2013-05-16: 650 mg via ORAL
  Filled 2013-05-16: qty 2

## 2013-05-16 NOTE — Progress Notes (Signed)
Rehab admissions - I met with pt to follow up after our rehab MD made the recommendation for home with follow up outpatient services. Pt was dressed and ready to go home. She said she has a supportive friend that is a [employer]. Pt understood the recommendation for continued follow up outpt therapy.  I discussed case with Hassan Rowan, case manager and Hassan Rowan to follow up on pt getting needed prescription for outpt therapy.  I will now sign off case. Please call me with any questions. Thanks.  Nanetta Batty, PT Rehabilitation Admissions Coordinator [phone]

## 2013-05-16 NOTE — Progress Notes (Signed)
Physical Therapy Treatment Patient Details Name: Elaine Allen MRN: [id number] DOB: [birth year] Today's Date: 05/16/2013    History of Present Illness Pt admitted with vertigo, dizziness and fall with noted Left PICA infarct and L vertebral artery dissection    PT Comments    Pt progressing with mobility.  Per CIR consult note, pt too high level & recommending OPPT.  D/c plans updated to OPPT at this time.  Pt cont's to present with weakness, impaired balance, impaired gait, & decreased independence.  Will cont to follow acutely & with current POC.     Follow Up Recommendations  Outpatient PT     Equipment Recommendations  None recommended by PT    Recommendations for Other Services       Precautions / Restrictions Precautions Precautions: Fall    Mobility  Bed Mobility Overal bed mobility: Modified Independent                Transfers Overall transfer level: Needs assistance Equipment used: Rolling walker (2 wheeled) Transfers: Sit to/from Stand Sit to Stand: Min guard         General transfer comment: guarding for safety.  Pt with mild left lean but able to correct with VC's  Ambulation/Gait Ambulation/Gait assistance: Min guard;Min assist Ambulation Distance (Feet): 200 Feet Assistive device: Rolling walker (2 wheeled);1 person hand held assist;None Gait Pattern/deviations: Step-through pattern;Decreased stride length;Narrow base of support;Drifts right/left Gait velocity: decreased   General Gait Details: Trialed ambulation with RW vs HHA vs None.  Pt requiring min assist for HHA & none but min guard (A) with use of RW.    Still drifting slightly to Left but pt able to correct without cueing.     Stairs            Wheelchair Mobility    Modified Rankin (Stroke Patients Only)       Balance                             High level balance activites: Side stepping;Turns (alternating stepping onto box, ) High Level Balance  Comments: Performed alternating step toe taps on box x 10 reps with & without UE support requiring min (A); external challenges to hips & shoulders; reaching Outside BOS at various heights with 1x LOB when reaching to Lt side requiring min (A) to prevent fall    Cognition Arousal/Alertness: Awake/alert Behavior During Therapy: WFL for tasks assessed/performed Overall Cognitive Status: Within Functional Limits for tasks assessed                      Exercises      General Comments        Pertinent Vitals/Pain No pain reported.     Home Living                      Prior Function            PT Goals (current goals can now be found in the care plan section) Acute Rehab PT Goals PT Goal Formulation: With patient Time For Goal Achievement: 05/29/13 Potential to Achieve Goals: Good Progress towards PT goals: Progressing toward goals    Frequency  Min 4X/week    PT Plan Discharge plan needs to be updated    Co-evaluation             End of Session Equipment Utilized During Treatment: Gait belt Activity Tolerance: Patient  tolerated treatment well Patient left: in chair;with call bell/phone within reach     Time: 0916-0931 PT Time Calculation (min): 15 min  Charges:  $Gait Training: 8-22 mins                    G Codes:      Elaine Allen 05/16/2013, 9:43 AM   Elaine Allen, PTA [phone] 05/16/2013

## 2013-05-16 NOTE — Progress Notes (Signed)
Stroke Team Progress Note  HISTORY Elaine Allen is a 58 y.o. female who noted her symptoms first on Wednesday at 1400 when she had acute onset vertigo while grocery shopping with her client. Her symptoms resolved spontaneously and relatively quickly and the patient returned to baseline. On Friday the patient woke up with a feeling of being hot, nausea, vomiting and vertigo. She also felt that she was falling to the left. This lasted the entire day and when she woke up on Saturday her symptoms were worse. They lasted a couple of hours then resolved completely. The patient went to bed normal. When she awakened 05/14/2013 the patient again had nausea, vomiting and vertigo. She lost her balance and fell backward hitting her head. Her client became concerned and EMS was called. Patient was brought in for evaluation.   Date last known well: Date: 05/13/2013  Time last known well: Time: 21:30  tPA Given: No: Outside time window   SUBJECTIVE No members present this morning. The patient feels she is getting better.  OBJECTIVE Most recent Vital Signs: Filed Vitals:   05/16/13 0325 05/16/13 0400 05/16/13 0751 05/16/13 0900  BP:  134/88 152/81   Pulse:  73 66   Temp: 99.4 F (37.4 C) 98.5 F (36.9 C) 97.9 F (36.6 C) 98.1 F (36.7 C)  TempSrc:  Oral Oral Oral  Resp:  18 16   Height:      Weight:  187 lb 14.4 oz (85.231 kg)    SpO2:  99% 99%    CBG (last 3)  No results found for this basename: GLUCAP,  in the last 72 hours  IV Fluid Intake:     MEDICATIONS  . amLODipine  5 mg Oral Daily  . aspirin  325 mg Oral Daily  . azithromycin  500 mg Oral Daily  . benzonatate  100 mg Oral TID  . clopidogrel  75 mg Oral Q breakfast  . docusate sodium  100 mg Oral BID  . fluticasone  1 spray Each Nare Daily  . polyethylene glycol  17 g Oral Daily  . simvastatin  10 mg Oral q1800  . sodium chloride  3 mL Intravenous Q12H   PRN:  sodium chloride, acetaminophen, guaiFENesin-codeine, meclizine,  sodium chloride  Diet:  Cardiac 1 diet with thin liquids Activity:   Up with assistance DVT Prophylaxis:  SCDs  CLINICALLY SIGNIFICANT STUDIES Basic Metabolic Panel:   Recent Labs Lab 05/14/13 1844  NA 143  K 3.6*  CL 104  CO2 24  GLUCOSE 107*  BUN 9  CREATININE 0.68  CALCIUM 9.1   Liver Function Tests:   Recent Labs Lab 05/14/13 1844  AST 21  ALT 13  ALKPHOS 114  BILITOT 0.6  PROT 8.3  ALBUMIN 3.7   CBC:   Recent Labs Lab 05/14/13 1913  WBC 3.9*  NEUTROABS 2.5  HGB 13.6  HCT 41.4  MCV 89.0  PLT 123*   Coagulation:   Recent Labs Lab 05/14/13 1844 05/14/13 2007  LABPROT 12.8 13.2  INR 0.98 1.02   Cardiac Enzymes:   Recent Labs Lab 05/14/13 1844  TROPONINI <0.30   Urinalysis: No results found for this basename: COLORURINE, APPERANCEUR, LABSPEC, PHURINE, GLUCOSEU, HGBUR, BILIRUBINUR, KETONESUR, PROTEINUR, UROBILINOGEN, NITRITE, LEUKOCYTESUR,  in the last 168 hours Lipid Panel    Component Value Date/Time   CHOL 167 05/15/2013 0350   TRIG 80 05/15/2013 0350   HDL 31* 05/15/2013 0350   CHOLHDL 5.4 05/15/2013 0350   VLDL 16 05/15/2013 0350  LDLCALC 120* 05/15/2013 0350   HgbA1C  Lab Results  Component Value Date   HGBA1C 6.3* 05/15/2013    Urine Drug Screen:   No results found for this basename: labopia,  cocainscrnur,  labbenz,  amphetmu,  thcu,  labbarb    Alcohol Level: No results found for this basename: ETH,  in the last 168 hours  Ct Head Wo Contrast 05/14/2013    No acute intracranial abnormality.  Mild cerebral atrophy.    Mr Maxine GlennMra Head Wo Contrast 05/14/2013    1. Acute nonhemorrhagic left PICA territory infarct. 2. Occluded left vertebral artery. This likely represents an acute dissection. 3. Bupthalmos.  Status post banding of the right globe.     2D Echocardiogram  ejection fraction 55-60%. No cardiac source of emboli identified.  Carotid Doppler Preliminary report: 1-39% stenosis noted bilaterally. Right vertebral artery flow  antegrade. Lt vertebral artery flow antegrade with spiked waveform suggestive of distal vertebral artery occlusion.   CXR    EKG  sinus arrhythmia rate 68 beats per minute. For complete results please see formal report.   Therapy Recommendations - inpatient rehabilitation recommended  Physical Exam    Mental Status:  Alert, oriented, thought content appropriate. Speech fluent without evidence of aphasia. Able to follow 3 step commands without difficulty.  Cranial Nerves:  II: Discs flat bilaterally; Visual fields grossly normal, pupils equal, round, reactive to light and accommodation .saccadic dysmetria on lateral gaze III,IV, VI: ptosis not present, extra-ocular motions intact bilaterally with horizontal nystagmus in all quadrants-worse on looking to the left  V,VII: smile symmetric, facial light touch sensation normal bilaterally  VIII: hearing normal bilaterally  IX,X: gag reflex present  XI: bilateral shoulder shrug  XII: midline tongue extension  Motor:  Right : Upper extremity 5/5 Left: Upper extremity 5-/5  Lower extremity 5/5 Lower extremity 5-/5  Tone and bulk:normal tone throughout; no atrophy noted  Sensory: Pinprick and light touch intact throughout, bilaterally  Deep Tendon Reflexes: 2+ and symmetric throughout  Plantars:  Right: downgoing Left: downgoing  Cerebellar:  Dysmetria with finger-to-nose testing on the left. Normal heel-to-shin testing  Gait: Unable to test   ASSESSMENT Ms. Elaine Allen is a 58 y.o. female presenting with vertigo and balance problems. TPA was not initiated secondary to late presentation. MRI  - acute nonhemorrhagic left PICA territory infarct. 2. Occluded left vertebral artery. This likely represents an acute dissection. Infarct felt to be thromboembolic secondary to dissection. On no antithrombotics prior to admission. Now on aspirin 325 mg orally every day and clopidogrel 75 mg orally every day for secondary stroke prevention. Patient  with resultant balance problems and difficulty controlling left upper extremity. Stroke work up underway.   Lupus  Chronic anemia  Hyperlipidemia - cholesterol 167 LDL 120    Hospital day # 2  TREATMENT/PLAN  Continue aspirin 325 mg orally every day and clopidogrel 75 mg orally every day for secondary stroke prevention.  Outpatient occupational physical therapy  Zocor added for elevated LDL  Discharged today  Followup Dr. Pearlean BrownieSethi in 2 months     Delton Seeavid Rinehuls PA-C [facility] Pager [phone] 05/16/2013, 1:37 PM  I have personally obtained a history, examined the patient, evaluated imaging results, and formulated the assessment and plan of care. I agree with the above.  Delia HeadyPramod Khayman Kirsch, MD   To contact Stroke Continuity provider, please refer to [email or web address]. After hours, contact General Neurology

## 2013-05-16 NOTE — Progress Notes (Addendum)
Discharge information reviewed and all questions answered.  Personal belongings and prescriptions sent with patient. All IV's removed.  Family member called to take patient home. Patient will wait in the discharge lounge by the main entrance.

## 2013-05-16 NOTE — Discharge Summary (Signed)
Physician Discharge Summary  Patient ID: Elaine Allen MRN: [id number] DOB/AGE: 08-May-1955 58 y.o.  Admit date: 05/14/2013 Discharge date: 05/16/2013  Primary Care Physician:  Aurora Mask, NP  Discharge Diagnoses:    . acute CVA (cerebral infarction) . Vertebral artery dissection . Vertigo due to cerebrovascular disease . SLE . Cerebellar stroke . Primary hyperparathyroidism . Chronic anemia . Essential hypertension, benign . Other and unspecified hyperlipidemia  Consults neurology, Dr. Leonie Man                  Inpatient rehabilitation   Recommendations for Outpatient Follow-up:  Patient did well with physical therapy, will continue outpatient rehabilitation  Please note that her hemoglobin A1c is 6.3, she may benefit from strict carb modified diet, possibly low dose of oral hypoglycemics. Please follow this closely. Patient will also benefit from outpatient diabetes and nutrition classes.  Allergies:   Allergies  Allergen Reactions  . Hydroxychloroquine Sulfate Rash and Other (See Comments)    blisters     Discharge Medications:   Medication List         amLODipine 5 MG tablet  Commonly known as:  NORVASC  Take 1 tablet (5 mg total) by mouth daily.     aspirin 325 MG tablet  Take 1 tablet (325 mg total) by mouth daily.     azithromycin 500 MG tablet  Commonly known as:  ZITHROMAX  Take 1 tablet (500 mg total) by mouth daily. X6 days  Start taking on:  05/17/2013     benzonatate 100 MG capsule  Commonly known as:  TESSALON  Take 1 capsule (100 mg total) by mouth 3 (three) times daily as needed for cough.     clopidogrel 75 MG tablet  Commonly known as:  PLAVIX  Take 1 tablet (75 mg total) by mouth daily with breakfast.     DSS 100 MG Caps  Take 100 mg by mouth 2 (two) times daily. For constipation     fluticasone 50 MCG/ACT nasal spray  Commonly known as:  FLONASE  Place 1 spray into both nostrils daily.     guaiFENesin-codeine 100-10 MG/5ML  syrup  Take 5 mLs by mouth every 6 (six) hours as needed for cough.     meclizine 25 MG tablet  Commonly known as:  ANTIVERT  Take 1 tablet (25 mg total) by mouth 3 (three) times daily as needed for dizziness (also available OTC).     OVER THE COUNTER MEDICATION  Take 1 tablet by mouth daily as needed (sinuses).     polyethylene glycol packet  Commonly known as:  MIRALAX / GLYCOLAX  Take 17 g by mouth daily as needed for moderate constipation (also available OTC).     simvastatin 10 MG tablet  Commonly known as:  ZOCOR  Take 1 tablet (10 mg total) by mouth at bedtime.     UNABLE TO FIND  - Outpatient physical therapy  -   -   - Diagnosis: Acute CVA/stroke         Brief H and P: For complete details please refer to admission H and P, but in brief 58 yo female h/o sle, hyperparathyroidism s/p parathyroidectomy comes in with several days of persistent dizziness/vertigo that almost made her pass out on the day of admission. Patient reported that about 3 days prior to admission,  she was in the grocery store and she had this sudden onset of vertigo with sharp pain in back of her neck, the vertigo was brief and resolved  on its own within several minutes. Then a day or so later the symptoms returned, not the headache but the vertiginous symptoms. No other focal neurological deficits. Her vertigo is improved if she remains lying still. No n/v. No rashes. No fevers.    Hospital Course:  Acute CVA (cerebral infarction) with Vertebral artery dissection  - MRI of the brain shows a left PICA infarct with occlusion of the left vertebral artery. Occlusion likely secondary to a dissection.  Neurology was consulted and patient was placed on aspirin and Plavix for secondary stroke prevention. Patient underwent full stroke workup, 2-D echo showed EF of 74-08%, grade 1 diastolic dysfunction. Carotid Dopplers showed 1-39% ICA stenosis bilaterally. - LDL 120, placed on statins  - PT, OT evaluation  initially recommended inpatient rehabilitation CIR was consulted however patient improved significantly and was felt that patient would continue to improve with outpatient physical therapy. - ESR normal . She will followup with Dr. Leonie Man outpatient  Acute bronchitis - Patient was given a Zithromax, Tessalon Perles, Flonase  Vertigo due to cerebrovascular disease - Continue meclizine as needed   Chronic anemia- stable   Essential hypertension, benign  - Not on any antihypertensives, started on Norvasc 5 mg  Other and unspecified hyperlipidemia  - Placed on statins   Day of Discharge BP 152/81  Pulse 66  Temp(Src) 98.1 F (36.7 C) (Oral)  Resp 16  Ht $R'5\' 4"'gW$  (1.626 m)  Wt 85.231 kg (187 lb 14.4 oz)  BMI 32.24 kg/m2  SpO2 99%  Physical Exam: General: Alert and awake oriented x3 not in any acute distress. CVS: S1-S2 clear no murmur rubs or gallops Chest: clear to auscultation bilaterally, no wheezing rales or rhonchi Abdomen: soft nontender, nondistended, normal bowel sounds Extremities: no cyanosis, clubbing or edema noted bilaterally    The results of significant diagnostics from this hospitalization (including imaging, microbiology, ancillary and laboratory) are listed below for reference.    LAB RESULTS: Basic Metabolic Panel:  Recent Labs Lab 05/14/13 1844  NA 143  K 3.6*  CL 104  CO2 24  GLUCOSE 107*  BUN 9  CREATININE 0.68  CALCIUM 9.1   Liver Function Tests:  Recent Labs Lab 05/14/13 1844  AST 21  ALT 13  ALKPHOS 114  BILITOT 0.6  PROT 8.3  ALBUMIN 3.7   No results found for this basename: LIPASE, AMYLASE,  in the last 168 hours No results found for this basename: AMMONIA,  in the last 168 hours CBC:  Recent Labs Lab 05/14/13 1913  WBC 3.9*  NEUTROABS 2.5  HGB 13.6  HCT 41.4  MCV 89.0  PLT 123*   Cardiac Enzymes:  Recent Labs Lab 05/14/13 1844  TROPONINI <0.30   BNP: No components found with this basename: POCBNP,  CBG: No  results found for this basename: GLUCAP,  in the last 168 hours  Significant Diagnostic Studies:  Ct Head Wo Contrast  05/14/2013   CLINICAL DATA:  Fall. Blunt trauma to back of head. Dizziness and nausea.  EXAM: CT HEAD WITHOUT CONTRAST  TECHNIQUE: Contiguous axial images were obtained from the base of the skull through the vertex without intravenous contrast.  COMPARISON:  None.  FINDINGS: There is no evidence of intracranial hemorrhage, brain edema, or other signs of acute infarction. There is no evidence of intracranial mass lesion or mass effect. No abnormal extraaxial fluid collections are identified.  Mild central cerebral atrophy is noted. Persistent cavum septum pellucidum and cavum vergae noted. No evidence hydrocephalus. No evidence of  skull fracture or other significant bone abnormality.  IMPRESSION: No acute intracranial abnormality.  Mild cerebral atrophy.   Electronically Signed   By: Earle Gell M.D.   On: 05/14/2013 14:26   Mr Jodene Nam Head Wo Contrast  05/14/2013   CLINICAL DATA:  Acute onset of dizziness and severe vertigo with falling. Question stroke.  EXAM: MRI HEAD WITHOUT CONTRAST  MRA HEAD WITHOUT CONTRAST  TECHNIQUE: Multiplanar, multiecho pulse sequences of the brain and surrounding structures were obtained without intravenous contrast. Angiographic images of the head were obtained using MRA technique without contrast.  COMPARISON:  None.  FINDINGS: MRI HEAD FINDINGS  Multi focal restricted diffusion is evident within the inferior left cerebellum compatible with an acute nonhemorrhagic left PICA territory infarct. No other acute infarct is present.  No hemorrhage or mass lesion is present. T2 changes are present in association with the infarct. Mild periventricular and subcortical white matter changes are noted as well. Incidental note is made of a persistent cavum septum pellucidum et vergae. Mild generalized atrophy is evident. The ventricles are proportionate to the degree of atrophy.  No significant extra-axial fluid collection is present.  The left vertebral artery is occluded. Flow is present in the right vertebral artery. Flow is present in the anterior circulation.  Scleral banding is noted at the right globe. The globes are mildly enlarged bilaterally. The orbits are otherwise intact. A fluid level is present in the right maxillary sinus. Mild mucosal thickening is present in the ethmoid air cells bilaterally, right greater than left. A left mastoid effusion is present. No obstructing nasopharyngeal lesion is evident.  MRA HEAD FINDINGS  The left vertebral artery occlusion is confirmed. This may be an acute dissection. The right vertebral artery is normal. The right PICA origin is within normal limits. The basilar artery is unremarkable. Both posterior cerebral arteries originate from the basilar tip.  The internal carotid arteries are within normal limits from the high cervical segments through the ICA termini bilaterally. The A1 and M1 segments are normal. The anterior communicating artery is patent. The MCA bifurcations are within normal limits bilaterally. ACA and MCA branch vessels are unremarkable.  IMPRESSION: 1. Acute nonhemorrhagic left PICA territory infarct. 2. Occluded left vertebral artery. This likely represents an acute dissection. 3. Bupthalmos.  Status post banding of the right globe. Critical Value/emergent results were called by telephone at the time of interpretation on 05/14/2013 at 5:50 PM to Dr. Rolland Porter , who verbally acknowledged these results.   Electronically Signed   By: Lawrence Santiago M.D.   On: 05/14/2013 18:00   Mr Brain Wo Contrast  05/14/2013   CLINICAL DATA:  Acute onset of dizziness and severe vertigo with falling. Question stroke.  EXAM: MRI HEAD WITHOUT CONTRAST  MRA HEAD WITHOUT CONTRAST  TECHNIQUE: Multiplanar, multiecho pulse sequences of the brain and surrounding structures were obtained without intravenous contrast. Angiographic images of the head  were obtained using MRA technique without contrast.  COMPARISON:  None.  FINDINGS: MRI HEAD FINDINGS  Multi focal restricted diffusion is evident within the inferior left cerebellum compatible with an acute nonhemorrhagic left PICA territory infarct. No other acute infarct is present.  No hemorrhage or mass lesion is present. T2 changes are present in association with the infarct. Mild periventricular and subcortical white matter changes are noted as well. Incidental note is made of a persistent cavum septum pellucidum et vergae. Mild generalized atrophy is evident. The ventricles are proportionate to the degree of atrophy. No significant extra-axial fluid  collection is present.  The left vertebral artery is occluded. Flow is present in the right vertebral artery. Flow is present in the anterior circulation.  Scleral banding is noted at the right globe. The globes are mildly enlarged bilaterally. The orbits are otherwise intact. A fluid level is present in the right maxillary sinus. Mild mucosal thickening is present in the ethmoid air cells bilaterally, right greater than left. A left mastoid effusion is present. No obstructing nasopharyngeal lesion is evident.  MRA HEAD FINDINGS  The left vertebral artery occlusion is confirmed. This may be an acute dissection. The right vertebral artery is normal. The right PICA origin is within normal limits. The basilar artery is unremarkable. Both posterior cerebral arteries originate from the basilar tip.  The internal carotid arteries are within normal limits from the high cervical segments through the ICA termini bilaterally. The A1 and M1 segments are normal. The anterior communicating artery is patent. The MCA bifurcations are within normal limits bilaterally. ACA and MCA branch vessels are unremarkable.  IMPRESSION: 1. Acute nonhemorrhagic left PICA territory infarct. 2. Occluded left vertebral artery. This likely represents an acute dissection. 3. Bupthalmos.  Status  post banding of the right globe. Critical Value/emergent results were called by telephone at the time of interpretation on 05/14/2013 at 5:50 PM to Dr. Rolland Porter , who verbally acknowledged these results.   Electronically Signed   By: Lawrence Santiago M.D.   On: 05/14/2013 18:00    2D ECHO: Study Conclusions  Left ventricle: The cavity size was normal. Wall thickness was normal. Systolic function was normal. The estimated ejection fraction was in the range of 55% to 60%. Wall motion was normal; there were no regional wall motion abnormalities. Doppler parameters are consistent with abnormal left ventricular relaxation (grade 1 diastolic dysfunction).    Disposition and Follow-up: Discharge Orders   Future Orders Complete By Expires   Diet Carb Modified  As directed    Discharge instructions  As directed    Increase activity slowly  As directed        DISPOSITION: Home  DIET: carb modified    DISCHARGE FOLLOW-UP Follow-up Information   Follow up with Ackerman, NP. Schedule an appointment as soon as possible for a visit in 2 weeks. (for hospital follow-up)    Specialty:  Nurse Practitioner      Follow up with Forbes Cellar, MD. Schedule an appointment as soon as possible for a visit in 2 months. (for hospital follow-up, [place])    Specialties:  Neurology, Radiology   Contact information:   [place] [address] [phone]       Time spent on Discharge: 40 minutes  Signed:   Mendel Corning M.D. [facility] 05/16/2013, 11:52 AM Pager: [phone]   **Disclaimer: This note was dictated with voice recognition software. Similar sounding words can inadvertently be transcribed and this note may contain transcription errors which may not have been corrected upon publication of note.**

## 2013-05-16 NOTE — Consult Note (Signed)
Physical Medicine and Rehabilitation Consult Reason for Consult: CVA Referring Physician: [facility]   HPI: Elaine Allen is a 58 y.o. right-handed female with history of SLE, hyperparathyroidism status post parathyroidectomy. Admitted 05/14/2013 with persistent dizziness/vertigo. MRI of the brain showed acute nonhemorrhagic left PICA territory infarcts. MRA of the head with occlusion left vertebral artery likely represented an acute dissection. Echocardiogram with ejection fraction of 60% grade 1 diastolic dysfunction. Carotid Dopplers no ICA stenosis. Patient did not receive TPA. Neurology services consulted maintained on aspirin and Plavix for CVA prophylaxis. She is tolerating a regular consistency diet. Physical therapy evaluation completed 05/15/2013 with recommendations of physical medicine rehabilitation consult.  Pt feels like she is improving, problems with coordination R hand Review of Systems  Gastrointestinal: Positive for constipation.  Neurological: Positive for dizziness.       Vertigo  All other systems reviewed and are negative.  Past Medical History  Diagnosis Date  . Lupus   . Spasmodic dysphonia   . Chronic anemia   . Kidney stones   . Retinal detachment    Past Surgical History  Procedure Laterality Date  . Abdominal hysterectomy    . Thyroid surgery    . Knee surgery Left    Family History  Problem Relation Age of Onset  . Cancer Mother     liver  . Cancer Father     prostate  . Diabetes Mother    Social History:  reports that she has never smoked. She does not have any smokeless tobacco history on file. She reports that she does not drink alcohol or use illicit drugs. Allergies:  Allergies  Allergen Reactions  . Hydroxychloroquine Sulfate Rash and Other (See Comments)    blisters   Medications Prior to Admission  Medication Sig Dispense Refill  . OVER THE COUNTER MEDICATION Take 1 tablet by mouth daily as needed (sinuses).         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 Functional Status:  Mobility: Bed Mobility Overal bed mobility: Modified Independent Transfers Overall transfer level: Needs assistance Transfers: Sit to/from Stand Sit to Stand: Min guard General transfer comment: Min guard for safety, Cues for technique Ambulation/Gait Ambulation/Gait assistance: Min assist Ambulation Distance (Feet): 40 Feet Assistive device: 1 person hand held assist Gait Pattern/deviations: Step-through pattern;Decreased stride length;Drifts right/left;Narrow base of support Gait velocity interpretation: <1.8 ft/sec, indicative of risk for recurrent falls General Gait Details: pt with left lean and drifting left with unsteady gait and LOB with ambulation    ADL: ADL Overall ADL's : Needs assistance/impaired Grooming: Wash/dry hands;Wash/dry face;Oral care;Set up;Supervision/safety;Standing Upper Body Bathing: Set up;Sitting Lower Body Bathing: Min guard;Sit to/from stand Upper Body Dressing : Set up;Sitting Lower Body Dressing: Min guard;Sit to/from stand Toilet Transfer: Minimal assistance;Ambulation;Regular Toilet;Grab bars;Moderate assistance Toileting- Clothing Manipulation and Hygiene: Min guard (standing) Functional mobility during ADLs: Minimal assistance;Min guard;Rolling walker (Min A without walker-handheld and Min guard with walker) General ADL Comments: Educated on signs/symptoms of stroke and importance of getting help right away. Educated on safe shoe wear. Pt performed grooming at sink and also  toileting tasks. Educated on activities pt can be doing for coordination in left  hand.  Cognition: Cognition Overall Cognitive Status: Within Functional Limits for tasks assessed Arousal/Alertness: Awake/alert Orientation Level: Oriented X4 Attention: Sustained Sustained Attention: Appears intact Memory: Impaired Memory Impairment: Retrieval deficit;Decreased short term memory (during formal task, functionally appears appropriate) Decreased Short Term Memory: Verbal basic Awareness: Appears intact Problem Solving: Impaired Problem Solving Impairment: Verbal complex Executive Function: Reasoning Reasoning: Impaired Reasoning Impairment: Verbal complex Safety/Judgment: Appears intact Cognition Arousal/Alertness: Awake/alert Behavior During Therapy: WFL for tasks assessed/performed Overall Cognitive Status: Within Functional Limits for tasks assessed Area of Impairment: Safety/judgement Safety/Judgement: Decreased awareness of deficits General Comments: Pt is a HHRN but was unable to states signs/symptoms of a stroke  Blood pressure 134/88, pulse 73, temperature 98.5 F (36.9 C), temperature source Oral, resp. rate 18, height 5\' 4"  (1.626 m), weight 85.231 kg (187 lb 14.4 oz), SpO2 99.00%. Physical Exam  Vitals reviewed. Constitutional: She is oriented to person, place, and time.  HENT:  Head: Normocephalic.  Eyes: EOM are normal.  Neck: Normal range of motion. Neck supple. No thyromegaly present.  Cardiovascular: Normal rate and regular rhythm.   Respiratory: Effort normal and breath sounds normal. No respiratory distress.  GI: Soft. Bowel sounds are normal. She exhibits no distension.  Neurological: She is alert and oriented to person, place, and time.  Follows three-step commands  Skin: Skin is warm and dry.  Psychiatric: She has a normal mood and affect.  Mild dysmetria L FNF Reduced LT sensation Left hand Sitting balance is good Standing balance fair Amb with Sup  assist bed to door  No results found for this or any previous visit (from the past 24 hour(s)). Ct Head Wo Contrast  05/14/2013   CLINICAL DATA:  Fall. Blunt trauma to back of head. Dizziness and nausea.  EXAM: CT HEAD WITHOUT CONTRAST  TECHNIQUE: Contiguous axial images were obtained from the base of the skull through the vertex without intravenous contrast.  COMPARISON:  None.  FINDINGS: There is no evidence of intracranial hemorrhage, brain edema, or other signs of acute infarction. There is no evidence of intracranial mass lesion or mass effect. No abnormal extraaxial fluid collections are identified.  Mild central cerebral atrophy is noted. Persistent cavum septum pellucidum and cavum vergae noted. No evidence hydrocephalus. No evidence of skull fracture or other significant bone abnormality.  IMPRESSION: No acute intracranial abnormality.  Mild cerebral atrophy.   Electronically Signed   By: Myles Rosenthal M.D.   On: 05/14/2013 14:26   Mr Maxine Glenn Head Wo Contrast  05/14/2013   CLINICAL DATA:  Acute onset of dizziness and severe vertigo with falling. Question stroke.  EXAM: MRI HEAD WITHOUT CONTRAST  MRA HEAD WITHOUT CONTRAST  TECHNIQUE: Multiplanar, multiecho pulse sequences of the brain and surrounding structures were obtained without intravenous contrast. Angiographic images of the head were obtained using MRA technique without contrast.  COMPARISON:  None.  FINDINGS: MRI HEAD FINDINGS  Multi focal restricted diffusion is evident within the inferior left cerebellum compatible with an acute nonhemorrhagic left PICA territory infarct. No other acute infarct is present.  No hemorrhage or mass lesion is present. T2 changes are present in association with the infarct. Mild periventricular and subcortical white matter changes are noted as well. Incidental note is made of a persistent cavum septum pellucidum et vergae. Mild generalized atrophy is evident. The ventricles are proportionate to the degree of atrophy.  No significant extra-axial fluid collection is present.  The left vertebral artery is occluded. Flow is present in the right vertebral artery. Flow is present  in the anterior circulation.  Scleral banding is noted at the right globe. The globes are mildly enlarged bilaterally. The orbits are otherwise intact. A fluid level is present in the right maxillary sinus. Mild mucosal thickening is present in the ethmoid air cells bilaterally, right greater than left. A left mastoid effusion is present. No obstructing nasopharyngeal lesion is evident.  MRA HEAD FINDINGS  The left vertebral artery occlusion is confirmed. This may be an acute dissection. The right vertebral artery is normal. The right PICA origin is within normal limits. The basilar artery is unremarkable. Both posterior cerebral arteries originate from the basilar tip.  The internal carotid arteries are within normal limits from the high cervical segments through the ICA termini bilaterally. The A1 and M1 segments are normal. The anterior communicating artery is patent. The MCA bifurcations are within normal limits bilaterally. ACA and MCA branch vessels are unremarkable.  IMPRESSION: 1. Acute nonhemorrhagic left PICA territory infarct. 2. Occluded left vertebral artery. This likely represents an acute dissection. 3. Bupthalmos.  Status post banding of the right globe. Critical Value/emergent results were called by telephone at the time of interpretation on 05/14/2013 at 5:50 PM to Dr. Devoria Albe , who verbally acknowledged these results.   Electronically Signed   By: Gennette Pac M.D.   On: 05/14/2013 18:00   Mr Brain Wo Contrast  05/14/2013   CLINICAL DATA:  Acute onset of dizziness and severe vertigo with falling. Question stroke.  EXAM: MRI HEAD WITHOUT CONTRAST  MRA HEAD WITHOUT CONTRAST  TECHNIQUE: Multiplanar, multiecho pulse sequences of the brain and surrounding structures were obtained without intravenous contrast. Angiographic images of the head  were obtained using MRA technique without contrast.  COMPARISON:  None.  FINDINGS: MRI HEAD FINDINGS  Multi focal restricted diffusion is evident within the inferior left cerebellum compatible with an acute nonhemorrhagic left PICA territory infarct. No other acute infarct is present.  No hemorrhage or mass lesion is present. T2 changes are present in association with the infarct. Mild periventricular and subcortical white matter changes are noted as well. Incidental note is made of a persistent cavum septum pellucidum et vergae. Mild generalized atrophy is evident. The ventricles are proportionate to the degree of atrophy. No significant extra-axial fluid collection is present.  The left vertebral artery is occluded. Flow is present in the right vertebral artery. Flow is present in the anterior circulation.  Scleral banding is noted at the right globe. The globes are mildly enlarged bilaterally. The orbits are otherwise intact. A fluid level is present in the right maxillary sinus. Mild mucosal thickening is present in the ethmoid air cells bilaterally, right greater than left. A left mastoid effusion is present. No obstructing nasopharyngeal lesion is evident.  MRA HEAD FINDINGS  The left vertebral artery occlusion is confirmed. This may be an acute dissection. The right vertebral artery is normal. The right PICA origin is within normal limits. The basilar artery is unremarkable. Both posterior cerebral arteries originate from the basilar tip.  The internal carotid arteries are within normal limits from the high cervical segments through the ICA termini bilaterally. The A1 and M1 segments are normal. The anterior communicating artery is patent. The MCA bifurcations are within normal limits bilaterally. ACA and MCA branch vessels are unremarkable.  IMPRESSION: 1. Acute nonhemorrhagic left PICA territory infarct. 2. Occluded left vertebral artery. This likely represents an acute dissection. 3. Bupthalmos.  Status  post banding of the right globe. Critical Value/emergent results were called by telephone at  the time of interpretation on 05/14/2013 at 5:50 PM to Dr. Devoria AlbeIVA KNAPP , who verbally acknowledged these results.   Electronically Signed   By: Gennette Pachris  Mattern M.D.   On: 05/14/2013 18:00    Assessment/Plan: Diagnosis: L PICA infarct with L UE ataxia and mild gait ataxia 1. Does the need for close, 24 hr/day medical supervision in concert with the patient's rehab needs make it unreasonable for this patient to be served in a less intensive setting? No 2. Co-Morbidities requiring supervision/potential complications: NA 3. Due to NA, does the patient require 24 hr/day rehab nursing? No 4. Does the patient require coordinated care of a physician, rehab nurse, NA to address physical and functional deficits in the context of the above medical diagnosis(es)? N/A Addressing deficits in the following areas: balance and locomotion 5. Can the patient actively participate in an intensive therapy program of at least 3 hrs of therapy per day at least 5 days per week? N/A 6. The potential for patient to make measurable gains while on inpatient rehab is NA 7. Anticipated functional outcomes upon discharge from inpatient rehab are n/a  with PT, n/a with OT, n/a with SLP. 8. Estimated rehab length of stay to reach the above functional goals is: NA 9. Does the patient have adequate social supports to accommodate these discharge functional goals? N/A 10. Anticipated D/C setting: Home 11. Anticipated post D/C treatments: Outpatient therapy 12. Overall Rehab/Functional Prognosis: excellent  RECOMMENDATIONS: This patient's condition is appropriate for continued rehabilitative care in the following setting: Outpatient Therapy Patient has agreed to participate in recommended program. Yes Note that insurance prior authorization may be required for reimbursement for recommended care.  Comment: Has family and neighbor that can  assist    05/16/2013

## 2013-05-16 NOTE — Care Management (Signed)
1209 05-16-13 Outpatient PT/OT referral sent to [facility]. They will call pt with appointment. Lamar LaundryBrenda Kaye GroverGraves-Bigelow, RN,BSN [phone]

## 2013-05-18 NOTE — Progress Notes (Signed)
Seen and agree with PTA note Ardian Haberland Tabor Gem Conkle, PT [phone]  

## 2013-05-24 ENCOUNTER — Ambulatory Visit: Payer: Medicare HMO | Attending: Internal Medicine | Admitting: Rehabilitative and Restorative Service Providers"

## 2013-05-24 DIAGNOSIS — R42 Dizziness and giddiness: Secondary | ICD-10-CM | POA: Diagnosis not present

## 2013-05-24 DIAGNOSIS — IMO0001 Reserved for inherently not codable concepts without codable children: Secondary | ICD-10-CM | POA: Diagnosis not present

## 2013-05-24 DIAGNOSIS — R269 Unspecified abnormalities of gait and mobility: Secondary | ICD-10-CM | POA: Diagnosis not present

## 2013-08-22 ENCOUNTER — Ambulatory Visit: Payer: Self-pay | Admitting: Nurse Practitioner

## 2013-08-23 ENCOUNTER — Encounter: Payer: Self-pay | Admitting: Nurse Practitioner

## 2013-08-23 ENCOUNTER — Ambulatory Visit (INDEPENDENT_AMBULATORY_CARE_PROVIDER_SITE_OTHER): Payer: Medicare PPO | Admitting: Nurse Practitioner

## 2013-08-23 VITALS — BP 143/90 | HR 90 | Ht 64.0 in | Wt 190.8 lb

## 2013-08-23 DIAGNOSIS — I6350 Cerebral infarction due to unspecified occlusion or stenosis of unspecified cerebral artery: Secondary | ICD-10-CM

## 2013-08-23 DIAGNOSIS — I639 Cerebral infarction, unspecified: Secondary | ICD-10-CM

## 2013-08-23 NOTE — Patient Instructions (Signed)
Continue clopidogrel 75 mg orally every day  for secondary stroke prevention and maintain strict control of hypertension with blood pressure goal below 130/90, diabetes with hemoglobin A1c goal below 6.5% and lipids with LDL cholesterol goal below 100 mg/dL. Followup in the future with me in 3 months, sooner as needed.  Stroke Prevention Some medical conditions and behaviors are associated with an increased chance of having a stroke. You may prevent a stroke by making healthy choices and managing medical conditions. HOW CAN I REDUCE MY RISK OF HAVING A STROKE?   Stay physically active. Get at least 30 minutes of activity on most or all days.  Do not smoke. It may also be helpful to avoid exposure to secondhand smoke.  Limit alcohol use. Moderate alcohol use is considered to be:  No more than 2 drinks per day for men.  No more than 1 drink per day for nonpregnant women.  Eat healthy foods. This involves:  Eating 5 or more servings of fruits and vegetables a day.  Making dietary changes that address high blood pressure (hypertension), high cholesterol, diabetes, or obesity.  Manage your cholesterol levels.  Making food choices that are high in fiber and low in saturated fat, trans fat, and cholesterol may control cholesterol levels.  Take any prescribed medicines to control cholesterol as directed by your health care provider.  Manage your diabetes.  Controlling your carbohydrate and sugar intake is recommended to manage diabetes.  Take any prescribed medicines to control diabetes as directed by your health care provider.  Control your hypertension.  Making food choices that are low in salt (sodium), saturated fat, trans fat, and cholesterol is recommended to manage hypertension.  Take any prescribed medicines to control hypertension as directed by your health care provider.  Maintain a healthy weight.  Reducing calorie intake and making food choices that are low in sodium,  saturated fat, trans fat, and cholesterol are recommended to manage weight.  Stop drug abuse.  Avoid taking birth control pills.  Talk to your health care provider about the risks of taking birth control pills if you are over 4 years old, smoke, get migraines, or have ever had a blood clot.  Get evaluated for sleep disorders (sleep apnea).  Talk to your health care provider about getting a sleep evaluation if you snore a lot or have excessive sleepiness.  Take medicines only as directed by your health care provider.  For some people, aspirin or blood thinners (anticoagulants) are helpful in reducing the risk of forming abnormal blood clots that can lead to stroke. If you have the irregular heart rhythm of atrial fibrillation, you should be on a blood thinner unless there is a good reason you cannot take them.  Understand all your medicine instructions.  Make sure that other conditions (such as anemia or atherosclerosis) are addressed. SEEK IMMEDIATE MEDICAL CARE IF:   You have sudden weakness or numbness of the face, arm, or leg, especially on one side of the body.  Your face or eyelid droops to one side.  You have sudden confusion.  You have trouble speaking (aphasia) or understanding.  You have sudden trouble seeing in one or both eyes.  You have sudden trouble walking.  You have dizziness.  You have a loss of balance or coordination.  You have a sudden, severe headache with no known cause.  You have new chest pain or an irregular heartbeat. Any of these symptoms may represent a serious problem that is an emergency. Do not  wait to see if the symptoms will go away. Get medical help at once. Call your local emergency services (911 in [place] Do not drive yourself to the hospital. Document Released: 01/30/2004 Document Revised: 05/08/2013 Document Reviewed: 06/24/2012 [facility] Patient Information 2015 [place]. This information is not intended to replace advice given  to you by your health care provider. Make sure you discuss any questions you have with your health care provider.

## 2013-08-23 NOTE — Progress Notes (Signed)
PATIENT: Elaine Allen DOB: [birth year]  REASON FOR VISIT: hospital follow up for stroke HISTORY FROM: patient  HISTORY OF PRESENT ILLNESS: Elaine Allen is a 58 y.o. AA female who comes to the office for first hospital follow up post hospital discharge for stroke. She noted her symptoms first on Wednesday at 1400 on 05/13/2013 when she had acute onset vertigo while grocery shopping with her client. Her symptoms resolved spontaneously and relatively quickly and the patient returned to baseline. On Friday the patient woke up with a feeling of being hot, nausea, vomiting and vertigo. She also felt that she was falling to the left. This lasted the entire day and when she woke up on Saturday her symptoms were worse. They lasted a couple of hours then resolved completely. The patient went to bed normal. When she awakened 05/14/2013 the patient again had nausea, vomiting and vertigo. She lost her balance and fell backward hitting her head. Her client became concerned and EMS was called. Patient was brought in for evaluation. MRI showed an acute nonhemorrhagic left PICA territory infarct. Mra Head showed an occluded left vertebral artery. This likely represents an acute dissection. Carotid Doppler showed 1-39% stenosis noted bilaterally. LDL 120, Hgb a1c 6.3. Inpatient rehabilitation was recommended, but patient wanted to go home, she has a good support system of CNAs.  The vertigo and nausea resolved quickly after hospital discharge. She states her blood pressure is high at times but usually normal, she was discharged on Norvasc but states that she feels poorly when she takes it. Her blood pressure in office today he is 143/90. She states that since the stroke, her spasmodic dysphonia has worsened, and is considering treatment with Botox at [facility]  REVIEW OF SYSTEMS: Full 14 system review of systems performed and notable only for: Activity change, light sensitivity, speech  difficulty.  ALLERGIES: Allergies  Allergen Reactions  . Hydroxychloroquine Sulfate Rash and Other (See Comments)    blisters    HOME MEDICATIONS: Outpatient Prescriptions Prior to Visit  Medication Sig Dispense Refill  . amLODipine (NORVASC) 5 MG tablet Take 1 tablet (5 mg total) by mouth daily.  90 tablet  3  . aspirin 325 MG tablet Take 1 tablet (325 mg total) by mouth daily.  90 tablet  3  . azithromycin (ZITHROMAX) 500 MG tablet Take 1 tablet (500 mg total) by mouth daily. X6 days  6 tablet  0  . benzonatate (TESSALON) 100 MG capsule Take 1 capsule (100 mg total) by mouth 3 (three) times daily as needed for cough.  30 capsule  0  . clopidogrel (PLAVIX) 75 MG tablet Take 1 tablet (75 mg total) by mouth daily with breakfast.  90 tablet  3  . docusate sodium 100 MG CAPS Take 100 mg by mouth 2 (two) times daily. For constipation  60 capsule  3  . fluticasone (FLONASE) 50 MCG/ACT nasal spray Place 1 spray into both nostrils daily.  16 g  2  . guaiFENesin-codeine 100-10 MG/5ML syrup Take 5 mLs by mouth every 6 (six) hours as needed for cough.  120 mL  0  . meclizine (ANTIVERT) 25 MG tablet Take 1 tablet (25 mg total) by mouth 3 (three) times daily as needed for dizziness (also available OTC).  90 tablet  3  . OVER THE COUNTER MEDICATION Take 1 tablet by mouth daily as needed (sinuses).      . polyethylene glycol (MIRALAX / GLYCOLAX) packet Take 17 g by mouth daily as  needed for moderate constipation (also available OTC).  30 each  3  . simvastatin (ZOCOR) 10 MG tablet Take 1 tablet (10 mg total) by mouth at bedtime.  90 tablet  3  . UNABLE TO FIND Outpatient physical therapy   Diagnosis: Acute CVA/stroke  1 Mutually Defined  0   No facility-administered medications prior to visit.    PHYSICAL EXAM Filed Vitals:   08/23/13 0937  BP: 143/90  Pulse: 90  Height: 5\' 4"  (1.626 m)  Weight: 190 lb 12.8 oz (86.546 kg)   Body mass index is 32.73 kg/(m^2).  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 Oropharynx benign  Neck: Supple, no carotid bruits  Cardiac: Regular rate rhythm, no murmur  Musculoskeletal: No deformity   Neurological examination  Mentation: Alert oriented to time, place, history taking. Follows all commands speech and language fluent, spasmodic dysphonia. Cranial nerve II-XII: Fundoscopic exam not done. Pupils were equal round reactive to light extraocular movements were full, visual field were full on confrontational test. Legally blind in right eye. Horizontal nystagmus in all quadrants-worse on looking to the left, saccadic dysmetria on lateral gaze. Facial sensation and strength were normal. hearing was intact to finger rubbing bilaterally. Uvula tongue midline. head turning and shoulder shrug and were normal and symmetric.Tongue protrusion into cheek strength was normal. Motor: The motor testing reveals 5 over 5 strength of all 4 extremities. Good symmetric motor tone is noted throughout. Diminished left hand fine motor skills. Sensory: Sensory testing is intact to soft touch on all 4 extremities. No evidence of extinction is noted.  Coordination: Cerebellar testing reveals good finger-nose-finger and heel-to-shin bilaterally.  Gait and station: Gait is normal. Tandem gait is normal. Romberg is negative. Reflexes: Deep tendon reflexes are symmetric and normal bilaterally.   NIHSS: 0 mRs: 1  DIAGNOSTIC DATA (LABS, IMAGING, TESTING) - I reviewed patient records, labs, notes, testing and imaging myself where available.  Lab Results  Component Value Date   WBC 3.9* 05/14/2013   HGB 13.6 05/14/2013   HCT 41.4 05/14/2013   MCV 89.0 05/14/2013   PLT 123* 05/14/2013      Component Value Date/Time   NA 143 05/14/2013 1844   K 3.6* 05/14/2013 1844   CL 104 05/14/2013 1844   CO2  24 05/14/2013 1844   GLUCOSE 107* 05/14/2013 1844   BUN 9 05/14/2013 1844   CREATININE 0.68 05/14/2013 1844   CALCIUM 9.1 05/14/2013 1844   CALCIUM 11.7* 10/15/2008 2333   PROT 8.3 05/14/2013 1844   ALBUMIN 3.7 05/14/2013 1844   AST 21 05/14/2013 1844   ALT 13 05/14/2013 1844   ALKPHOS 114 05/14/2013 1844   BILITOT 0.6 05/14/2013 1844   GFRNONAA >90 05/14/2013 1844   GFRAA >90 05/14/2013 1844   Lab Results  Component Value Date   CHOL 167 05/15/2013   HDL 31* 05/15/2013   LDLCALC 120* 05/15/2013   TRIG 80 05/15/2013   CHOLHDL 5.4 05/15/2013   Lab Results  Component Value Date   HGBA1C 6.3* 05/15/2013    ASSESSMENT: Ms. Elaine Allen is a 57 y.o. female who presented with vertigo and balance problems on 05/15/13.  MRI showed an acute nonhemorrhagic left PICA territory infarct. Occluded left vertebral artery. This likely represents an acute dissection. Infarct felt to be thromboembolic  secondary to dissection.  Patient with resultant mild loss of left hand fine motor skills, spasmodic dysphonia has worsened.  PLAN: I had a long discussion with the patient regarding her recent stroke, discussed results of evaluation in the hospital and answered questions. Continue clopidogrel 75 mg orally every day  for secondary stroke prevention and maintain strict control of hypertension with blood pressure goal below 140/90, diabetes with hemoglobin A1c goal below 6.5% and lipids with LDL cholesterol goal below 100 mg/dL.  I advised her to establish care with a primary care physician, [facility] as it is close proximity to her home. Followup in the future with me in 3 months, sooner as needed.  Return in about 3 months (around 11/23/2013) for stroke revisit.  Tawny AsalLYNN E. Dail Lerew, MSN, FNP-BC, A/GNP-C 08/23/2013, 4:51 PM [facility] [address], [address] [place], [place] [address] [phone]  Note: This document was prepared with digital dictation and possible smart  phrase technology. Any transcriptional errors that result from this process are unintentional.

## 2013-08-24 NOTE — Progress Notes (Signed)
I agree with the above plan 

## 2013-08-28 ENCOUNTER — Telehealth: Payer: Self-pay | Admitting: *Deleted

## 2013-08-28 NOTE — Telephone Encounter (Signed)
Patient has been scheduled @ [facility] with Dr. Nicholos Johns @ 1:30. Office # [phone] is she needs to reschedule.

## 2013-11-23 ENCOUNTER — Ambulatory Visit: Payer: Medicare PPO | Admitting: Neurology

## 2014-05-03 DIAGNOSIS — R5383 Other fatigue: Secondary | ICD-10-CM | POA: Diagnosis not present

## 2014-05-03 DIAGNOSIS — M25512 Pain in left shoulder: Secondary | ICD-10-CM | POA: Diagnosis not present

## 2014-05-03 DIAGNOSIS — I10 Essential (primary) hypertension: Secondary | ICD-10-CM | POA: Diagnosis not present

## 2014-11-12 ENCOUNTER — Ambulatory Visit: Payer: Commercial Managed Care - HMO | Admitting: Internal Medicine

## 2014-11-12 ENCOUNTER — Encounter: Payer: Self-pay | Admitting: Internal Medicine

## 2014-11-12 ENCOUNTER — Other Ambulatory Visit (INDEPENDENT_AMBULATORY_CARE_PROVIDER_SITE_OTHER): Payer: Commercial Managed Care - HMO

## 2014-11-12 VITALS — BP 126/86 | HR 82 | Temp 98.3°F | Resp 16 | Ht 64.0 in | Wt 200.5 lb

## 2014-11-12 DIAGNOSIS — Z23 Encounter for immunization: Secondary | ICD-10-CM

## 2014-11-12 DIAGNOSIS — Z8673 Personal history of transient ischemic attack (TIA), and cerebral infarction without residual deficits: Secondary | ICD-10-CM | POA: Diagnosis not present

## 2014-11-12 DIAGNOSIS — M329 Systemic lupus erythematosus, unspecified: Secondary | ICD-10-CM

## 2014-11-12 DIAGNOSIS — I10 Essential (primary) hypertension: Secondary | ICD-10-CM

## 2014-11-12 DIAGNOSIS — Z800 Family history of malignant neoplasm of digestive organs: Secondary | ICD-10-CM | POA: Insufficient documentation

## 2014-11-12 LAB — COMPREHENSIVE METABOLIC PANEL
ALT: 14 U/L (ref 0–35)
AST: 21 U/L (ref 0–37)
Albumin: 3.9 g/dL (ref 3.5–5.2)
Alkaline Phosphatase: 109 U/L (ref 39–117)
BUN: 10 mg/dL (ref 6–23)
CO2: 27 mEq/L (ref 19–32)
Calcium: 9.7 mg/dL (ref 8.4–10.5)
Chloride: 106 mEq/L (ref 96–112)
Creatinine, Ser: 0.75 mg/dL (ref 0.40–1.20)
GFR: 100.97 mL/min (ref >60.00)
Glucose, Bld: 99 mg/dL (ref 70–99)
Potassium: 3.8 mEq/L (ref 3.5–5.1)
Sodium: 140 mEq/L (ref 135–145)
Total Bilirubin: 0.8 mg/dL (ref 0.2–1.2)
Total Protein: 8.1 g/dL (ref 6.0–8.3)

## 2014-11-12 LAB — CBC
HCT: 40.9 % (ref 36.0–46.0)
Hemoglobin: 13.5 g/dL (ref 12.0–15.0)
MCHC: 33 g/dL (ref 30.0–36.0)
MCV: 87.9 fl (ref 78.0–100.0)
Platelets: 153 10*3/uL (ref 150.0–400.0)
RBC: 4.65 Mil/uL (ref 3.87–5.11)
RDW: 14.7 % (ref 11.5–15.5)
WBC: 2.7 10*3/uL — ABNORMAL LOW (ref 4.0–10.5)

## 2014-11-12 LAB — TSH: TSH: 1.56 u[IU]/mL (ref 0.35–4.50)

## 2014-11-12 LAB — LIPID PANEL
Cholesterol: 177 mg/dL (ref 0–200)
HDL: 36.8 mg/dL — ABNORMAL LOW (ref >39.00)
LDL Cholesterol: 120 mg/dL — ABNORMAL HIGH (ref 0–99)
NonHDL: 140.09
Total CHOL/HDL Ratio: 5
Triglycerides: 101 mg/dL (ref 0.0–149.0)
VLDL: 20.2 mg/dL (ref 0.0–40.0)

## 2014-11-12 LAB — HEMOGLOBIN A1C: Hgb A1c MFr Bld: 6.1 % (ref 4.6–6.5)

## 2014-11-12 MED ORDER — LOSARTAN POTASSIUM-HCTZ 50-12.5 MG PO TABS: 1.0000 | ORAL_TABLET | Freq: Every day | ORAL | Status: DC

## 2014-11-12 NOTE — Assessment & Plan Note (Signed)
Referral to GI placed for colonoscopy. Concerning given her recent change in bowel habits and no history of screening.

## 2014-11-12 NOTE — Assessment & Plan Note (Signed)
Unclear history and she is unclear. Does get rash with sun etc but some doctor told her she did not have. Getting records.

## 2014-11-12 NOTE — Patient Instructions (Signed)
We will check the blood work today and call you back with the results.  We will get your records and look over the lupus diagnosis.   We will get you in with the stomach doctor about the colonoscopy.

## 2014-11-12 NOTE — Assessment & Plan Note (Signed)
Not taking her cholesterol medicine, BP medicine, not taking aspirin or plavix as recommended in the past. No symptoms of new stroke but we talked about her higher risk of that today. Checking labs and will decide on treatment including lipid, cmp, cbc, HgA1c. Treat as indicated if she agrees.

## 2014-11-12 NOTE — Progress Notes (Signed)
Pre visit review using our clinic review tool, if applicable. No additional management support is needed unless otherwise documented below in the visit note. 

## 2014-11-12 NOTE — Progress Notes (Signed)
   Subjective:    Patient ID: Elaine Allen, female    DOB: Dec 27, 1955, 59 y.o.   MRN: [id number]  HPI The patient is a 59 YO new female coming in for several concerns. She has history of stroke (related to blood pressure) and she has been out of her blood pressure medicine for a couple weeks. No residual deficits from the stroke. She does not take daily but checks her blood pressure daily and if she needs the medicine she takes it. Generally she does not feel that she needs it.  Her second concern is change in bowel habits. Getting pain and pressure with eating and more constipated lately. Never had colonoscopy and family history of dad died of colon cancer. No blood that she noticed. This has been going on for many months. No weight loss or gain. No night time bowel movements. Not really relieved with bowel movements.   [facility], [facility] reviewed and updated.   Review of Systems  Constitutional: Negative for fever, chills, activity change, appetite change, fatigue and unexpected weight change.  HENT: Negative.   Eyes: Negative.   Respiratory: Negative for cough, chest tightness, shortness of breath and wheezing.   Cardiovascular: Negative for chest pain, palpitations and leg swelling.  Gastrointestinal: Positive for abdominal pain, constipation and abdominal distention. Negative for nausea, vomiting, diarrhea and anal bleeding.  Musculoskeletal: Negative.   Skin: Negative.   Neurological: Negative.   Psychiatric/Behavioral: Negative.       Objective:   Physical Exam  Constitutional: She is oriented to person, place, and time. She appears well-developed and well-nourished.  HENT:  Head: Normocephalic and atraumatic.  Eyes: EOM are normal.  Neck: Normal range of motion.  Cardiovascular: Normal rate and regular rhythm.   Pulmonary/Chest: Effort normal and breath sounds normal.  Abdominal: Soft. Bowel sounds are normal. She exhibits no distension. There is tenderness. There is no  rebound.  Minimal tenderness, normal BS, no radiation or guarding or mass felt.  Musculoskeletal: She exhibits no edema.  Neurological: She is alert and oriented to person, place, and time. Coordination normal.  Skin: Skin is warm and dry.  Psychiatric: She has a normal mood and affect.   Filed Vitals:   11/12/14 1053  BP: 126/86  Pulse: 82  Temp: 98.3 F (36.8 C)  TempSrc: Oral  Resp: 16  Height: 5\' 4"  (1.626 m)  Weight: 200 lb 8 oz (90.946 kg)  SpO2: 98%      Assessment & Plan:  Flu shot given at visit.

## 2014-11-12 NOTE — Assessment & Plan Note (Signed)
Have asked her to take one blood pressure medicine daily as the spike in blood pressure was what caused the stroke in 2014 but she does not wish to do this. She will monitor BP daily and if it elevates take the medicine for a few days in a row. Rx for losartan/hctz given today. Checking CMP today and adjust as needed. Unclear if it is complicated by CKD as she mentioned something about some kidney changes but she is not sure about that.

## 2014-11-13 ENCOUNTER — Encounter: Payer: Self-pay | Admitting: Internal Medicine

## 2014-11-15 ENCOUNTER — Telehealth: Payer: Self-pay | Admitting: Internal Medicine

## 2014-11-15 NOTE — Telephone Encounter (Signed)
We are awaiting her records to make sure we are not duplicating therapy.

## 2014-11-15 NOTE — Telephone Encounter (Signed)
Pt send a massage from my chart request these injections/test done: Hepatitis C Screening,Tetanus/Tdap,Zostavax. Please advise, is this okey for pt to get?

## 2014-11-19 ENCOUNTER — Encounter: Payer: Self-pay | Admitting: Internal Medicine

## 2014-12-05 ENCOUNTER — Ambulatory Visit (INDEPENDENT_AMBULATORY_CARE_PROVIDER_SITE_OTHER): Payer: Commercial Managed Care - HMO | Admitting: *Deleted

## 2014-12-05 DIAGNOSIS — Z23 Encounter for immunization: Secondary | ICD-10-CM

## 2015-01-02 ENCOUNTER — Ambulatory Visit (INDEPENDENT_AMBULATORY_CARE_PROVIDER_SITE_OTHER): Payer: Commercial Managed Care - HMO

## 2015-01-02 ENCOUNTER — Ambulatory Visit (AMBULATORY_SURGERY_CENTER): Payer: Self-pay | Admitting: *Deleted

## 2015-01-02 VITALS — Ht 64.0 in | Wt 205.0 lb

## 2015-01-02 DIAGNOSIS — Z1211 Encounter for screening for malignant neoplasm of colon: Secondary | ICD-10-CM

## 2015-01-02 DIAGNOSIS — Z23 Encounter for immunization: Secondary | ICD-10-CM

## 2015-01-02 NOTE — Progress Notes (Signed)
Patient denies any allergies to eggs or soy. Patient denies any problems with anesthesia/sedation. Patient denies any oxygen use at home and does not take any diet/weight loss medications. EMMI education assisgned to patient on colonoscopy, this was explained and instructions given to patient. 2 day Miralax prep given to pt due to problems with constipation (per pt).

## 2015-01-16 ENCOUNTER — Ambulatory Visit (AMBULATORY_SURGERY_CENTER): Payer: Commercial Managed Care - HMO | Admitting: Internal Medicine

## 2015-01-16 ENCOUNTER — Encounter: Payer: Self-pay | Admitting: Internal Medicine

## 2015-01-16 VITALS — BP 132/85 | HR 83 | Temp 97.4°F | Resp 20 | Ht 64.0 in | Wt 205.0 lb

## 2015-01-16 DIAGNOSIS — I10 Essential (primary) hypertension: Secondary | ICD-10-CM | POA: Diagnosis not present

## 2015-01-16 DIAGNOSIS — M329 Systemic lupus erythematosus, unspecified: Secondary | ICD-10-CM | POA: Diagnosis not present

## 2015-01-16 DIAGNOSIS — Z8673 Personal history of transient ischemic attack (TIA), and cerebral infarction without residual deficits: Secondary | ICD-10-CM | POA: Diagnosis not present

## 2015-01-16 DIAGNOSIS — Z1211 Encounter for screening for malignant neoplasm of colon: Secondary | ICD-10-CM

## 2015-01-16 MED ORDER — SODIUM CHLORIDE 0.9 % IV SOLN: 500.0000 mL | INTRAVENOUS | Status: DC

## 2015-01-16 NOTE — Op Note (Signed)
[facility] [address] [place], [id number]   COLONOSCOPY PROCEDURE REPORT  PATIENT: Elaine Allen, Elaine Allen  MR#: [id number] BIRTHDATE: 1955-04-19 , 59  yrs. old GENDER: female ENDOSCOPIST: Iva Booparl E Naeemah Jasmer, MD, [facility] PROCEDURE DATE:  01/16/2015 PROCEDURE:   Colonoscopy, screening First Screening Colonoscopy - Avg.  risk and is 50 yrs.  old or older Yes.  Prior Negative Screening - Now for repeat screening. N/A  History of Adenoma - Now for follow-up colonoscopy & has been > or = to 3 yrs.  N/A  Polyps removed today? No Recommend repeat exam, <10 yrs? No ASA CLASS:   Class II INDICATIONS:Screening for colonic neoplasia and Colorectal Neoplasm Risk Assessment for this procedure is average risk. MEDICATIONS: Propofol 260 mg IV and Monitored anesthesia care  DESCRIPTION OF PROCEDURE:   After the risks benefits and alternatives of the procedure were thoroughly explained, informed consent was obtained.  The digital rectal exam revealed no abnormalities of the rectum.   The LB [id number]  endoscope was introduced through the anus and advanced to the cecum, which was identified by both the appendix and ileocecal valve. No adverse events experienced.   The quality of the prep was good.  (MiraLax was used)  The instrument was then slowly withdrawn as the colon was fully examined. Estimated blood loss is zero unless otherwise noted in this procedure report.      COLON FINDINGS: A normal appearing cecum, ileocecal valve, and appendiceal orifice were identified.  The ascending, transverse, descending, sigmoid colon, and rectum appeared unremarkable. Retroflexed views revealed no abnormalities. The time to cecum = 4.8 Withdrawal time = 12.8   The scope was withdrawn and the procedure completed. COMPLICATIONS: There were no immediate complications.  ENDOSCOPIC IMPRESSION: Normal colonoscopy  RECOMMENDATIONS: Repeat colonoscopy/screening test 10 years.   2027  eSigned:  Iva Booparl E Anthonie Lotito, MD, [facility] 01/16/2015 9:58 AM   cc: The Patient and Dr. Hillard DankerElizabeth Crawford

## 2015-01-16 NOTE — Progress Notes (Signed)
Patient denies any allergies to eggs or soy. 

## 2015-01-16 NOTE — Patient Instructions (Addendum)
The colonoscopy was normal!  Next routine colonoscopy in 10 years - 2027  I appreciate the opportunity to care for you. Iva Booparl E. Allycia Pitz, MD, FACG   YOU HAD AN ENDOSCOPIC PROCEDURE TODAY AT THE [facility]:   Refer to the procedure report that was given to you for any specific questions about what was found during the examination.  If the procedure report does not answer your questions, please call your gastroenterologist to clarify.  If you requested that your care partner not be given the details of your procedure findings, then the procedure report has been included in a sealed envelope for you to review at your convenience later.  YOU SHOULD EXPECT: Some feelings of bloating in the abdomen. Passage of more gas than usual.  Walking can help get rid of the air that was put into your GI tract during the procedure and reduce the bloating. If you had a lower endoscopy (such as a colonoscopy or flexible sigmoidoscopy) you may notice spotting of blood in your stool or on the toilet paper. If you underwent a bowel prep for your procedure, you may not have a normal bowel movement for a few days.  Please Note:  You might notice some irritation and congestion in your nose or some drainage.  This is from the oxygen used during your procedure.  There is no need for concern and it should clear up in a day or so.  SYMPTOMS TO REPORT IMMEDIATELY:   Following lower endoscopy (colonoscopy or flexible sigmoidoscopy):  Excessive amounts of blood in the stool  Significant tenderness or worsening of abdominal pains  Swelling of the abdomen that is new, acute  Fever of 100F or higher    For urgent or emergent issues, a gastroenterologist can be reached at any hour by calling [phone].   DIET: Your first meal following the procedure should be a small meal and then it is ok to progress to your normal diet. Heavy or fried foods are harder to digest and may make you feel nauseous or  bloated.  Likewise, meals heavy in dairy and vegetables can increase bloating.  Drink plenty of fluids but you should avoid alcoholic beverages for 24 hours.  ACTIVITY:  You should plan to take it easy for the rest of today and you should NOT DRIVE or use heavy machinery until tomorrow (because of the sedation medicines used during the test).    FOLLOW UP: Our staff will call the number listed on your records the next business day following your procedure to check on you and address any questions or concerns that you may have regarding the information given to you following your procedure. If we do not reach you, we will leave a message.  However, if you are feeling well and you are not experiencing any problems, there is no need to return our call.  We will assume that you have returned to your regular daily activities without incident.  If any biopsies were taken you will be contacted by phone or by letter within the next 1-3 weeks.  Please call us at [phone] if you have not heard about the biopsies in 3 weeks.    SIGNATURES/CONFIDENTIALITY: You and/or your care partner have signed paperwork which will be entered into your electronic medical record.  These signatures attest to the fact that that the information above on your After Visit Summary has been reviewed and is understood.  Full responsibility of the confidentiality of this discharge information  lies with you and/or your care-partner.

## 2015-01-16 NOTE — Progress Notes (Signed)
Report to PACU, RN, vss, BBS= Clear.  

## 2015-01-17 ENCOUNTER — Telehealth: Payer: Self-pay

## 2015-01-17 NOTE — Telephone Encounter (Signed)
  Follow up Call-  Call back number 01/16/2015  Post procedure Call Back phone  # [phone]  Permission to leave phone message Yes     Patient questions:  Do you have a fever, pain , or abdominal swelling? No. Pain Score  0 *  Have you tolerated food without any problems? Yes.    Have you been able to return to your normal activities? Yes.    Do you have any questions about your discharge instructions: Diet   No. Medications  No. Follow up visit  No.  Do you have questions or concerns about your Care? No.  Actions: * If pain score is 4 or above: No action needed, pain <4.

## 2015-03-01 ENCOUNTER — Encounter: Payer: Self-pay | Admitting: Internal Medicine

## 2015-03-01 ENCOUNTER — Ambulatory Visit (INDEPENDENT_AMBULATORY_CARE_PROVIDER_SITE_OTHER): Payer: Commercial Managed Care - HMO | Admitting: Internal Medicine

## 2015-03-01 VITALS — BP 128/84 | HR 69 | Temp 98.5°F | Resp 18 | Ht 64.5 in | Wt 204.0 lb

## 2015-03-01 DIAGNOSIS — G4700 Insomnia, unspecified: Secondary | ICD-10-CM

## 2015-03-01 MED ORDER — ZALEPLON 5 MG PO CAPS: 5.0000 mg | ORAL_CAPSULE | Freq: Every evening | ORAL | Status: DC | PRN

## 2015-03-01 NOTE — Assessment & Plan Note (Signed)
Rx for sonata and talked to her about the fact that her sleep cycle is not adjusted and having an unpredictable schedule worsens this problem and lengthens the time it takes to adjust.

## 2015-03-01 NOTE — Progress Notes (Signed)
   Subjective:    Patient ID: Elaine Allen, female    DOB: 1955-07-21, 60 y.o.   MRN: [id number]  HPI The patient is a 60 YO female coming in for insomnia. 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 This has caused her sleep cycle to be unpredictable. Her work also sometimes needs her after her shift and she does not get to try to sleep until 1PM. She is sleeping maybe 1 hour per day. Previously she has been on 3rd shift and stabilized at about 4 hours per night. She is crashing on the weekend when she does not work and will sleep for 16-18 hours straight. Has had sleep study in the past without OSA and tried [place] in the past which did not work for her.   Review of Systems  Constitutional: Negative.   Respiratory: Negative.   Cardiovascular: Negative.   Gastrointestinal: Negative.   Neurological: Negative.   Psychiatric/Behavioral: Positive for sleep disturbance, dysphoric mood and decreased concentration. Negative for behavioral problems and confusion. The patient is not nervous/anxious.       Objective:   Physical Exam  Constitutional: She appears well-developed and well-nourished.  HENT:  Head: Normocephalic and atraumatic.  Eyes: EOM are normal.  Neck: Normal range of motion.  Cardiovascular: Normal rate and regular rhythm.   Pulmonary/Chest: Effort normal and breath sounds normal.  Abdominal: Soft. She exhibits no distension. There is no tenderness.  Psychiatric:  Tired during our visit   Filed Vitals:   03/01/15 1048  BP: 128/84  Pulse: 69  Temp: 98.5 F (36.9 C)  TempSrc: Oral  Resp: 18  Height: 5' 4.5" (1.638 m)  Weight: 204 lb (92.534 kg)  SpO2: 99%      Assessment & Plan:

## 2015-03-01 NOTE — Progress Notes (Signed)
Pre visit review using our clinic review tool, if applicable. No additional management support is needed unless otherwise documented below in the visit note. 

## 2015-03-01 NOTE — Patient Instructions (Signed)
We have given you the prescription for the sonata which is a sleep medicine that helps you relax to sleep. You can take 1 pill for sleep about 30 minutes before trying to sleep.   Give it a couple of tries before you decide it will not work.   If you can at work it really helps to have a stable work schedule so that your body can adjust to that.

## 2015-03-06 ENCOUNTER — Telehealth: Payer: Self-pay | Admitting: Internal Medicine

## 2015-03-06 NOTE — Telephone Encounter (Signed)
Patient called to advise that she believes that the zaleplon (SONATA) 5 MG capsule [[id number]] is causing her bp to spike. She went without taking it for a period, and the bp went back to norma. She then tried the sonata, and the bp spiked again. This morning it was 169/110.

## 2015-03-07 NOTE — Telephone Encounter (Signed)
Is this before or after taking her BP medicine in the morning? It is fine for her to stop the sonata if she thinks it is bothering her BP.

## 2015-03-19 ENCOUNTER — Encounter: Payer: Self-pay | Admitting: Internal Medicine

## 2015-03-19 NOTE — Telephone Encounter (Signed)
Received message from patient about possible wrong health information left on her voicemail.

## 2015-09-05 DIAGNOSIS — H5212 Myopia, left eye: Secondary | ICD-10-CM | POA: Diagnosis not present

## 2015-09-05 DIAGNOSIS — H5210 Myopia, unspecified eye: Secondary | ICD-10-CM | POA: Diagnosis not present

## 2015-10-01 ENCOUNTER — Ambulatory Visit (INDEPENDENT_AMBULATORY_CARE_PROVIDER_SITE_OTHER): Payer: Commercial Managed Care - HMO | Admitting: Internal Medicine

## 2015-10-01 ENCOUNTER — Encounter: Payer: Self-pay | Admitting: Internal Medicine

## 2015-10-01 ENCOUNTER — Other Ambulatory Visit (INDEPENDENT_AMBULATORY_CARE_PROVIDER_SITE_OTHER): Payer: Commercial Managed Care - HMO

## 2015-10-01 VITALS — BP 148/98 | HR 70 | Temp 98.4°F | Resp 18 | Ht 64.5 in | Wt 209.0 lb

## 2015-10-01 DIAGNOSIS — Z23 Encounter for immunization: Secondary | ICD-10-CM | POA: Diagnosis not present

## 2015-10-01 DIAGNOSIS — M329 Systemic lupus erythematosus, unspecified: Secondary | ICD-10-CM

## 2015-10-01 DIAGNOSIS — M5442 Lumbago with sciatica, left side: Secondary | ICD-10-CM | POA: Diagnosis not present

## 2015-10-01 DIAGNOSIS — M549 Dorsalgia, unspecified: Secondary | ICD-10-CM | POA: Insufficient documentation

## 2015-10-01 LAB — COMPREHENSIVE METABOLIC PANEL WITH GFR
ALT: 24 U/L (ref 0–35)
AST: 25 U/L (ref 0–37)
Albumin: 3.7 g/dL (ref 3.5–5.2)
Alkaline Phosphatase: 113 U/L (ref 39–117)
BUN: 14 mg/dL (ref 6–23)
CO2: 31 meq/L (ref 19–32)
Calcium: 9.2 mg/dL (ref 8.4–10.5)
Chloride: 105 meq/L (ref 96–112)
Creatinine, Ser: 0.72 mg/dL (ref 0.40–1.20)
GFR: 105.53 mL/min
Glucose, Bld: 94 mg/dL (ref 70–99)
Potassium: 3.9 meq/L (ref 3.5–5.1)
Sodium: 141 meq/L (ref 135–145)
Total Bilirubin: 0.5 mg/dL (ref 0.2–1.2)
Total Protein: 7.9 g/dL (ref 6.0–8.3)

## 2015-10-01 LAB — CBC
HCT: 37.3 % (ref 36.0–46.0)
Hemoglobin: 12.6 g/dL (ref 12.0–15.0)
MCHC: 33.7 g/dL (ref 30.0–36.0)
MCV: 87.3 fl (ref 78.0–100.0)
Platelets: 149 10*3/uL — ABNORMAL LOW (ref 150.0–400.0)
RBC: 4.27 Mil/uL (ref 3.87–5.11)
RDW: 14 % (ref 11.5–15.5)
WBC: 3.7 10*3/uL — ABNORMAL LOW (ref 4.0–10.5)

## 2015-10-01 LAB — HEMOGLOBIN A1C: Hgb A1c MFr Bld: 6.2 % (ref 4.6–6.5)

## 2015-10-01 LAB — TSH: TSH: 2.51 u[IU]/mL (ref 0.35–4.50)

## 2015-10-01 MED ORDER — CYCLOBENZAPRINE HCL 10 MG PO TABS: 10.0000 mg | ORAL_TABLET | Freq: Three times a day (TID) | ORAL | 0 refills | Status: DC | PRN

## 2015-10-01 MED ORDER — PREDNISONE 20 MG PO TABS: 40.0000 mg | ORAL_TABLET | Freq: Every day | ORAL | 0 refills | Status: DC

## 2015-10-01 NOTE — Progress Notes (Signed)
Pre visit review using our clinic review tool, if applicable. No additional management support is needed unless otherwise documented below in the visit note. 

## 2015-10-01 NOTE — Patient Instructions (Signed)
We have sent in prednisone for the pain. Take 2 pills daily for the next week.   We have also sent in flexeril for the muscles that you can use up to 3 times a day as needed.   Call us if you are not feeling better.

## 2015-10-01 NOTE — Assessment & Plan Note (Signed)
Rx for prednisone burst as well as checking labs for lupus flare causing the symptoms.

## 2015-10-01 NOTE — Assessment & Plan Note (Signed)
Concern that this could be a lupus flare so checking complement levels. She is not currently under treatment for her lupus. Treating with prednisone.

## 2015-10-01 NOTE — Progress Notes (Signed)
   Subjective:    Patient ID: Elaine Allen, female    DOB: Aug 19, 1955, 60 y.o.   MRN: [id number]  HPI The patient is a 60 YO female coming in for low back pain. She does have history of lupus and she has had flares in the past with skin lesions and joint discomfort. She had slipped or ruptured disc in 20s and since that time she gets flares off and on. No trigger with activity or fall. She is taking aleve which helps but only for a short amount of time. She is using heat some which is effective for short term only as well. She does have tingling and numbness in the arms and legs which is not new but is slightly worse in the legs since the back pain is back. Overall stable since onset 2-3 weeks ago. She has been off aspirin for some time (hx stroke).  Review of Systems  Constitutional: Negative for activity change, appetite change, fatigue, fever and unexpected weight change.  Respiratory: Negative.   Cardiovascular: Negative.   Gastrointestinal: Negative.   Musculoskeletal: Positive for arthralgias, back pain and myalgias.  Skin: Negative.   Neurological: Positive for weakness and numbness.  Psychiatric/Behavioral: Negative for behavioral problems, confusion, decreased concentration and dysphoric mood. The patient is not nervous/anxious.       Objective:   Physical Exam  Constitutional: She appears well-developed and well-nourished.  HENT:  Head: Normocephalic and atraumatic.  Eyes: EOM are normal.  Neck: Normal range of motion.  Cardiovascular: Normal rate and regular rhythm.   Pulmonary/Chest: Effort normal and breath sounds normal.  Abdominal: Soft. She exhibits no distension. There is no tenderness.  Musculoskeletal: She exhibits tenderness.  No tenderness to palpation low back, some pain in the lateral hip.   Skin: Skin is warm and dry.   Vitals:   10/01/15 0837  BP: (!) 138/102  Pulse: 70  Resp: 18  Temp: 98.4 F (36.9 C)  TempSrc: Oral  SpO2: 98%  Weight: 209 lb (94.8  kg)  Height: 5' 4.5" (1.638 m)      Assessment & Plan:  Flu shot given at visit.

## 2015-10-04 ENCOUNTER — Telehealth: Payer: Self-pay | Admitting: Geriatric Medicine

## 2015-10-04 NOTE — Telephone Encounter (Signed)
Called patient to see how she was feeling since she started taking the prednisone. There were a couple labs that were ordered that were not drawn. I need to know if patient still wants to have them drawn.

## 2015-10-15 ENCOUNTER — Telehealth: Payer: Self-pay | Admitting: Internal Medicine

## 2015-10-15 NOTE — Telephone Encounter (Signed)
These can all be done at wellness visit if she wants to schedule

## 2015-10-15 NOTE — Telephone Encounter (Signed)
Patient sent mychart request asking for a hep c order, mammogram, and pap smear referral.

## 2015-10-17 NOTE — Telephone Encounter (Signed)
Sent mychart message to patient

## 2015-12-23 ENCOUNTER — Telehealth: Payer: Self-pay | Admitting: Internal Medicine

## 2015-12-23 NOTE — Telephone Encounter (Signed)
Patient requesting HEP C.  Please advise.

## 2015-12-24 NOTE — Telephone Encounter (Signed)
No answer on home number and no voice mail. Voice mail has not been set up on cell. Need to inform patient that we can do her Hep C screening at her next office visit.

## 2015-12-25 ENCOUNTER — Ambulatory Visit (INDEPENDENT_AMBULATORY_CARE_PROVIDER_SITE_OTHER): Payer: Commercial Managed Care - HMO | Admitting: Internal Medicine

## 2015-12-25 ENCOUNTER — Encounter: Payer: Self-pay | Admitting: Internal Medicine

## 2015-12-25 ENCOUNTER — Other Ambulatory Visit (INDEPENDENT_AMBULATORY_CARE_PROVIDER_SITE_OTHER): Payer: Commercial Managed Care - HMO

## 2015-12-25 VITALS — BP 138/80 | HR 100 | Temp 98.7°F | Resp 20 | Wt 210.0 lb

## 2015-12-25 DIAGNOSIS — R05 Cough: Secondary | ICD-10-CM

## 2015-12-25 DIAGNOSIS — R1010 Upper abdominal pain, unspecified: Secondary | ICD-10-CM | POA: Diagnosis not present

## 2015-12-25 DIAGNOSIS — R059 Cough, unspecified: Secondary | ICD-10-CM | POA: Insufficient documentation

## 2015-12-25 DIAGNOSIS — R062 Wheezing: Secondary | ICD-10-CM

## 2015-12-25 DIAGNOSIS — R109 Unspecified abdominal pain: Secondary | ICD-10-CM | POA: Insufficient documentation

## 2015-12-25 LAB — URINALYSIS, ROUTINE W REFLEX MICROSCOPIC
Bilirubin Urine: NEGATIVE
Hgb urine dipstick: NEGATIVE
Ketones, ur: NEGATIVE
Leukocytes, UA: NEGATIVE
Nitrite: NEGATIVE
Specific Gravity, Urine: 1.025 (ref 1.000–1.030)
Total Protein, Urine: NEGATIVE
Urine Glucose: NEGATIVE
Urobilinogen, UA: 0.2 (ref 0.0–1.0)
pH: 6 (ref 5.0–8.0)

## 2015-12-25 LAB — LIPASE: Lipase: 50 U/L (ref 11.0–59.0)

## 2015-12-25 LAB — BASIC METABOLIC PANEL
BUN: 10 mg/dL (ref 6–23)
CO2: 27 mEq/L (ref 19–32)
Calcium: 9.3 mg/dL (ref 8.4–10.5)
Chloride: 108 mEq/L (ref 96–112)
Creatinine, Ser: 0.71 mg/dL (ref 0.40–1.20)
GFR: 107.16 mL/min (ref >60.00)
Glucose, Bld: 108 mg/dL — ABNORMAL HIGH (ref 70–99)
Potassium: 4.1 mEq/L (ref 3.5–5.1)
Sodium: 141 mEq/L (ref 135–145)

## 2015-12-25 LAB — CBC WITH DIFFERENTIAL/PLATELET
Basophils Absolute: 0 10*3/uL (ref 0.0–0.1)
Basophils Relative: 0.5 % (ref 0.0–3.0)
Eosinophils Absolute: 0.1 10*3/uL (ref 0.0–0.7)
Eosinophils Relative: 4.6 % (ref 0.0–5.0)
HCT: 37.3 % (ref 36.0–46.0)
Hemoglobin: 12.6 g/dL (ref 12.0–15.0)
Lymphocytes Relative: 55.5 % — ABNORMAL HIGH (ref 12.0–46.0)
Lymphs Abs: 1.5 10*3/uL (ref 0.7–4.0)
MCHC: 33.9 g/dL (ref 30.0–36.0)
MCV: 87.8 fl (ref 78.0–100.0)
Monocytes Absolute: 0.3 10*3/uL (ref 0.1–1.0)
Monocytes Relative: 12.9 % — ABNORMAL HIGH (ref 3.0–12.0)
Neutro Abs: 0.7 10*3/uL — ABNORMAL LOW (ref 1.4–7.7)
Neutrophils Relative %: 26.5 % — ABNORMAL LOW (ref 43.0–77.0)
Platelets: 170 10*3/uL (ref 150.0–400.0)
RBC: 4.24 Mil/uL (ref 3.87–5.11)
RDW: 14.8 % (ref 11.5–15.5)
WBC: 2.7 10*3/uL — ABNORMAL LOW (ref 4.0–10.5)

## 2015-12-25 LAB — HEPATIC FUNCTION PANEL
ALT: 43 U/L — ABNORMAL HIGH (ref 0–35)
AST: 41 U/L — ABNORMAL HIGH (ref 0–37)
Albumin: 3.8 g/dL (ref 3.5–5.2)
Alkaline Phosphatase: 105 U/L (ref 39–117)
Bilirubin, Direct: 0.1 mg/dL (ref 0.0–0.3)
Total Bilirubin: 0.5 mg/dL (ref 0.2–1.2)
Total Protein: 7.8 g/dL (ref 6.0–8.3)

## 2015-12-25 MED ORDER — PREDNISONE 10 MG PO TABS: ORAL_TABLET | ORAL | 0 refills | Status: DC

## 2015-12-25 MED ORDER — METHYLPREDNISOLONE ACETATE 80 MG/ML IJ SUSP
80.0000 mg | Freq: Once | INTRAMUSCULAR | Status: AC
  Administered 2015-12-25: 80 mg via INTRAMUSCULAR

## 2015-12-25 MED ORDER — AZITHROMYCIN 250 MG PO TABS: ORAL_TABLET | ORAL | 1 refills | Status: DC

## 2015-12-25 MED ORDER — HYDROCODONE-HOMATROPINE 5-1.5 MG/5ML PO SYRP: 5.0000 mL | ORAL_SOLUTION | Freq: Four times a day (QID) | ORAL | 0 refills | Status: AC | PRN

## 2015-12-25 NOTE — Progress Notes (Signed)
Pre visit review using our clinic review tool, if applicable. No additional management support is needed unless otherwise documented below in the visit note. 

## 2015-12-25 NOTE — Patient Instructions (Signed)
You had the steroid shot today  Please take all new medication as prescribed - the antibiotic, cough medicine, and prednisone  Please continue all other medications as before, and refills have been done if requested.  Please have the pharmacy call with any other refills you may need.  Please continue your efforts at being more active, low cholesterol diet, and weight control.  Please keep your appointments with your specialists as you may have planned  Please go to the LAB in the Basement (turn left off the elevator) for the tests to be done today  You will be contacted by phone if any changes need to be made immediately.  Otherwise, you will receive a letter about your results with an explanation, but please check with MyChart first.  Please remember to sign up for MyChart if you have not done so, as this will be important to you in the future with finding out test results, communicating by private email, and scheduling acute appointments online when needed.

## 2015-12-25 NOTE — Progress Notes (Signed)
Subjective:    Patient ID: Elaine MaltaJill L Sohn, female    DOB: 1955/01/12, 60 y.o.   MRN: [id number]  HPI  Here to f/u, c/o upper abd pain and soreness but only after leaning forward such as tying the shoes or picking up off the floor.  Has gained significant wt recently with increased abd adiposity. Denies worsening reflux, other abd pain, dysphagia, n/v, bowel change or blood.  Pt denies fever, wt loss, night sweats, loss of appetite, or other constitutional symptoms Wt Readings from Last 3 Encounters:  12/25/15 210 lb (95.3 kg)  10/01/15 209 lb (94.8 kg)  03/01/15 204 lb (92.5 kg)  Incidentaly also with Here with acute onset mild to mod 2-3 days ST, HA, general weakness, greenish sputum, but Pt denies chest pain, increased sob or doe, wheezing, orthopnea, PND, increased LE swelling, palpitations, dizziness or syncope, but has had worsening sob/wheezing last 2 days.  No other new history findings Past Medical History:  Diagnosis Date  . Chronic anemia   . Kidney stones   . Lupus   . Retinal detachment   . Spasmodic dysphonia   . Stroke [facility]CC) 2015   Past Surgical History:  Procedure Laterality Date  . ABDOMINAL HYSTERECTOMY    . KNEE SURGERY Left   . THYROID SURGERY      reports that she has never smoked. She has never used smokeless tobacco. She reports that she does not drink alcohol or use drugs. family history includes Cancer in her father and mother; Diabetes in her mother. Allergies  Allergen Reactions  . Azithromycin Nausea And Vomiting  . Hydroxychloroquine Sulfate Rash and Other (See Comments)    blisters   Current Outpatient Prescriptions on File Prior to Visit  Medication Sig Dispense Refill  . cyclobenzaprine (FLEXERIL) 10 MG tablet Take 1 tablet (10 mg total) by mouth 3 (three) times daily as needed for muscle spasms. 60 tablet 0  . losartan-hydrochlorothiazide (HYZAAR) 50-12.5 MG tablet Take 1 tablet by mouth daily. 90 tablet 3  . predniSONE (DELTASONE) 20 MG tablet  Take 2 tablets (40 mg total) by mouth daily with breakfast. 14 tablet 0   No current facility-administered medications on file prior to visit.    Review of Systems  Constitutional: Negative for unusual diaphoresis or night sweats HENT: Negative for ear swelling or discharge Eyes: Negative for worsening visual haziness  Respiratory: Negative for choking and stridor.   Gastrointestinal: Negative for distension or worsening eructation Genitourinary: Negative for retention or change in urine volume.  Musculoskeletal: Negative for other MSK pain or swelling Skin: Negative for color change and worsening wound Neurological: Negative for tremors and numbness other than noted  Psychiatric/Behavioral: Negative for decreased concentration or agitation other than above   All other system neg per pt    Objective:   Physical Exam BP 138/80   Pulse 100   Temp 98.7 F (37.1 C) (Oral)   Resp 20   Wt 210 lb (95.3 kg)   SpO2 96%   BMI 35.49 kg/m  VS noted, mild ill Constitutional: Pt appears in no apparent distress HENT: Head: NCAT.  Right Ear: External ear normal.  Left Ear: External ear normal.  Eyes: . Pupils are equal, round, and reactive to light. Conjunctivae and EOM are normal Bilat tm's with mild erythema.  Max sinus areas non tender.  Pharynx with mild erythema, no exudate Neck: Normal range of motion. Neck supple.  Cardiovascular: Normal rate and regular rhythm.   Pulmonary/Chest: Effort normal and breath  sounds decresaed without rales but with few scattered wheezing.  Abd:  Soft, NT, ND, + BS - benign exam Neurological: Pt is alert. Not confused , motor grossly intact Skin: Skin is warm. No rash, no LE edema Psychiatric: Pt behavior is normal. No agitation.  No other new exam findings    Assessment & Plan:

## 2015-12-26 ENCOUNTER — Encounter: Payer: Self-pay | Admitting: Internal Medicine

## 2015-12-26 MED ORDER — AMOXICILLIN 500 MG PO CAPS: 1000.0000 mg | ORAL_CAPSULE | Freq: Two times a day (BID) | ORAL | 0 refills | Status: DC

## 2015-12-26 MED ORDER — ONDANSETRON HCL 4 MG PO TABS: 4.0000 mg | ORAL_TABLET | Freq: Three times a day (TID) | ORAL | 0 refills | Status: DC | PRN

## 2015-12-26 NOTE — Telephone Encounter (Signed)
Then/v could be related to the zpack  Ok to stop this  Please take all new medication as prescribed - the amoxil

## 2015-12-29 NOTE — Assessment & Plan Note (Signed)
Most c/w msk discomfort with wt gain, benign exam,cont to monitor

## 2015-12-29 NOTE — Assessment & Plan Note (Signed)
/  Mild to mod, for predpac asd,  to f/u any worsening symptoms or concerns 

## 2015-12-29 NOTE — Assessment & Plan Note (Signed)
Mild to mod, c/w broncihits vs pna, declines cxr, for antibx course, cough med prn, to f/u any worsening symptoms or concerns

## 2016-02-10 DIAGNOSIS — H52209 Unspecified astigmatism, unspecified eye: Secondary | ICD-10-CM | POA: Diagnosis not present

## 2016-02-10 DIAGNOSIS — H5213 Myopia, bilateral: Secondary | ICD-10-CM | POA: Diagnosis not present

## 2016-02-10 DIAGNOSIS — H524 Presbyopia: Secondary | ICD-10-CM | POA: Diagnosis not present

## 2016-03-18 ENCOUNTER — Ambulatory Visit (INDEPENDENT_AMBULATORY_CARE_PROVIDER_SITE_OTHER): Payer: Medicare HMO

## 2016-03-18 DIAGNOSIS — Z111 Encounter for screening for respiratory tuberculosis: Secondary | ICD-10-CM

## 2016-03-23 ENCOUNTER — Ambulatory Visit (INDEPENDENT_AMBULATORY_CARE_PROVIDER_SITE_OTHER): Payer: Medicare HMO

## 2016-03-23 DIAGNOSIS — Z111 Encounter for screening for respiratory tuberculosis: Secondary | ICD-10-CM

## 2016-03-25 LAB — TB SKIN TEST
Induration: 0 mm
TB Skin Test: NEGATIVE

## 2016-05-05 ENCOUNTER — Ambulatory Visit (INDEPENDENT_AMBULATORY_CARE_PROVIDER_SITE_OTHER): Payer: Medicare HMO | Admitting: Internal Medicine

## 2016-05-05 ENCOUNTER — Encounter: Payer: Self-pay | Admitting: Internal Medicine

## 2016-05-05 ENCOUNTER — Other Ambulatory Visit (INDEPENDENT_AMBULATORY_CARE_PROVIDER_SITE_OTHER): Payer: Medicare HMO

## 2016-05-05 VITALS — BP 124/76 | HR 99 | Temp 98.3°F | Resp 12 | Ht 64.5 in | Wt 204.0 lb

## 2016-05-05 DIAGNOSIS — L930 Discoid lupus erythematosus: Secondary | ICD-10-CM

## 2016-05-05 DIAGNOSIS — R200 Anesthesia of skin: Secondary | ICD-10-CM

## 2016-05-05 DIAGNOSIS — Z1159 Encounter for screening for other viral diseases: Secondary | ICD-10-CM

## 2016-05-05 DIAGNOSIS — M329 Systemic lupus erythematosus, unspecified: Secondary | ICD-10-CM | POA: Diagnosis not present

## 2016-05-05 DIAGNOSIS — R202 Paresthesia of skin: Secondary | ICD-10-CM

## 2016-05-05 LAB — COMPREHENSIVE METABOLIC PANEL
ALT: 59 U/L — ABNORMAL HIGH (ref 0–35)
AST: 53 U/L — ABNORMAL HIGH (ref 0–37)
Albumin: 4.2 g/dL (ref 3.5–5.2)
Alkaline Phosphatase: 122 U/L — ABNORMAL HIGH (ref 39–117)
BUN: 18 mg/dL (ref 6–23)
CO2: 29 mEq/L (ref 19–32)
Calcium: 9.8 mg/dL (ref 8.4–10.5)
Chloride: 102 mEq/L (ref 96–112)
Creatinine, Ser: 0.88 mg/dL (ref 0.40–1.20)
GFR: 83.55 mL/min (ref >60.00)
Glucose, Bld: 110 mg/dL — ABNORMAL HIGH (ref 70–99)
Potassium: 3.6 mEq/L (ref 3.5–5.1)
Sodium: 139 mEq/L (ref 135–145)
Total Bilirubin: 0.9 mg/dL (ref 0.2–1.2)
Total Protein: 8.7 g/dL — ABNORMAL HIGH (ref 6.0–8.3)

## 2016-05-05 LAB — TSH: TSH: 2.18 u[IU]/mL (ref 0.35–4.50)

## 2016-05-05 LAB — CBC
HCT: 40.3 % (ref 36.0–46.0)
Hemoglobin: 13.7 g/dL (ref 12.0–15.0)
MCHC: 33.9 g/dL (ref 30.0–36.0)
MCV: 89.2 fl (ref 78.0–100.0)
Platelets: 92 10*3/uL — ABNORMAL LOW (ref 150.0–400.0)
RBC: 4.52 Mil/uL (ref 3.87–5.11)
RDW: 13.8 % (ref 11.5–15.5)
WBC: 3.5 10*3/uL — ABNORMAL LOW (ref 4.0–10.5)

## 2016-05-05 LAB — HEPATITIS C ANTIBODY: HCV Ab: NEGATIVE

## 2016-05-05 LAB — CK: Total CK: 135 U/L (ref 7–177)

## 2016-05-05 MED ORDER — PREDNISONE 20 MG PO TABS: 40.0000 mg | ORAL_TABLET | Freq: Every day | ORAL | 0 refills | Status: DC

## 2016-05-05 NOTE — Progress Notes (Signed)
Pre visit review using our clinic review tool, if applicable. No additional management support is needed unless otherwise documented below in the visit note. 

## 2016-05-05 NOTE — Assessment & Plan Note (Signed)
Concerning for lupus flare with weakness, swelling, pain and numbness. Rx for prednisone for the symptoms to see if there is improvement. Checking complement levels. She is not on therapy and has not followed up on her lupus in some time.

## 2016-05-05 NOTE — Assessment & Plan Note (Signed)
Concerning given her history of stroke, could also be flare of lupus. Rx for prednisone to see if this is lupus and also checking labs with complement levels to narrow. If no resolution or signs of lupus flare will check MRI head and neck due to prior dissection with stroke (no follow up since that time). She is not taking aspirin anymore. She is not in window for treatment if stroke as symptoms now ongoing for about 3 weeks.

## 2016-05-05 NOTE — Patient Instructions (Signed)
We are sending in prednisone to help with the symptoms in case this is the lupus. Take 2 pills daily and call us back in 2-3 days to let us know if it is helping with the weakness and numbness.   We are checking labs today to try to see if the lupus is flaring. If there is not good changes with the prednisone we will check imaging of the brain and neck to check for problems there causing the numbness and weakness.

## 2016-05-05 NOTE — Progress Notes (Signed)
   Subjective:    Patient ID: Elaine Allen, female    DOB: [birth year], 61 y.o.   MRN: [id number]  HPI The patient is a 61 YO female coming in for pain in her left back and forearm. She does have concurrent lupus and she is not sure if this is related. She has had it for about 3 weeks with her forearm. She is having weakness in the hand and wrist as well as numbness in the wrist. She feels pain in the forearm and shoulder region. This is worrying her as she had similar symptoms with stroke back several years ago with numbness in the same region. Does have some pain in the neck but rare.   Review of Systems  Constitutional: Positive for activity change and fatigue. Negative for appetite change, chills, fever and unexpected weight change.  HENT: Negative.   Eyes: Negative.   Respiratory: Negative.   Cardiovascular: Negative.   Gastrointestinal: Negative.   Musculoskeletal: Positive for arthralgias and myalgias.  Skin: Negative.   Neurological: Positive for weakness and numbness. Negative for dizziness, seizures, syncope, facial asymmetry, speech difficulty and headaches.  Hematological: Negative.   Psychiatric/Behavioral: Negative.       Objective:   Physical Exam  Constitutional: She is oriented to person, place, and time. She appears well-developed and well-nourished.  HENT:  Head: Normocephalic and atraumatic.  Eyes: EOM are normal.  Neck: Normal range of motion.  Cardiovascular: Normal rate and regular rhythm.   Pulmonary/Chest: Effort normal. No respiratory distress. She has no wheezes. She has no rales.  Abdominal: Soft. Bowel sounds are normal.  Neurological: She is alert and oriented to person, place, and time. A cranial nerve deficit is present. Coordination abnormal.  Decreased feeling on the left hand and wrist as well as on the forearm, forearm tender to touch as well as in the left shoulder and arm to palpation. Neck ROM intact, shoulder ROM decreased  Skin: Skin is warm  and dry.  No rash on her arms   Vitals:   05/05/16 0834  BP: 124/76  Pulse: 99  Resp: 12  Temp: 98.3 F (36.8 C)  TempSrc: Oral  SpO2: 98%  Weight: 204 lb (92.5 kg)  Height: 5' 4.5" (1.638 m)      Assessment & Plan:

## 2016-05-07 LAB — C3 AND C4
C3 Complement: 147 mg/dL (ref 83–193)
C4 Complement: 39 mg/dL (ref 15–57)

## 2016-05-12 ENCOUNTER — Encounter: Payer: Self-pay | Admitting: Internal Medicine

## 2016-06-05 ENCOUNTER — Encounter: Payer: Self-pay | Admitting: Internal Medicine

## 2016-06-05 ENCOUNTER — Ambulatory Visit (INDEPENDENT_AMBULATORY_CARE_PROVIDER_SITE_OTHER): Payer: Medicare HMO | Admitting: Internal Medicine

## 2016-06-05 DIAGNOSIS — I10 Essential (primary) hypertension: Secondary | ICD-10-CM

## 2016-06-05 NOTE — Progress Notes (Signed)
   Subjective:    Patient ID: Elaine Allen, female    DOB: [birth year], 61 y.o.   MRN: [id number]  HPI The patient is a 61 YO female coming in for high blood pressures (at home BP in the 190/110 range several days in a row, she was having headaches and some numbness with tingling in her neck and head, did not seek care, taking her losartan/hctz as prescribed 50/12.5 mg daily, BP have been running high most of the time at home in the 150s/90s even when she takes her meds, she has history of stroke and does not want to have another, she is not having headache or numbness now, symptoms lasted 30-90 minutes). No other new concerns.   Review of Systems  Constitutional: Positive for activity change. Negative for appetite change, diaphoresis, fatigue, fever and unexpected weight change.  Respiratory: Negative.   Cardiovascular: Negative.   Gastrointestinal: Negative.   Musculoskeletal: Negative.   Skin: Negative.   Neurological: Positive for dizziness, light-headedness, numbness and headaches. Negative for tremors, syncope and weakness.  Psychiatric/Behavioral: Negative.      Objective:   Physical Exam  Constitutional: She is oriented to person, place, and time. She appears well-developed and well-nourished.  HENT:  Head: Normocephalic and atraumatic.  Eyes: EOM are normal.  Neck: Normal range of motion. No JVD present.  Cardiovascular: Normal rate and regular rhythm.   Pulmonary/Chest: Effort normal and breath sounds normal.  Abdominal: Soft.  Musculoskeletal: She exhibits no edema.  Lymphadenopathy:    She has no cervical adenopathy.  Neurological: She is alert and oriented to person, place, and time. No cranial nerve deficit. Coordination normal.  Skin: Skin is warm and dry.   Vitals:   06/05/16 0924  BP: 138/90  Pulse: (!) 105  Resp: 12  Temp: 98.7 F (37.1 C)  TempSrc: Oral  SpO2: 99%  Weight: 202 lb (91.6 kg)  Height: 5' 4.5" (1.638 m)      Assessment & Plan:

## 2016-06-05 NOTE — Patient Instructions (Signed)
We will have you take the blood pressure medicine 1 pill in the morning and 1 pill in the evening.  Call us with blood pressure readings in about 1 week to let us know how it is going.  If the blood pressure is not coming down call us back.   If the blood pressure is high and you are having numbness or tingling or nausea call us immediately or go to the ER.

## 2016-06-06 NOTE — Assessment & Plan Note (Signed)
BP running too high. Advised to increase to 1 pill BID of losartan/hctz 50/12.5 mg. Come back in 1 month for follow up and call with readings in 1 week. If symptoms of numbness or weakness return with headache advised that she needs to seek care in ER as this could be stroke or onset of stroke and since she has had this in the past she is higher risk for another.

## 2016-06-15 ENCOUNTER — Encounter: Payer: Self-pay | Admitting: Internal Medicine

## 2016-06-16 ENCOUNTER — Telehealth: Payer: Self-pay

## 2016-06-16 ENCOUNTER — Encounter: Payer: Self-pay | Admitting: Internal Medicine

## 2016-06-16 NOTE — Telephone Encounter (Signed)
Error

## 2016-06-17 MED ORDER — NEBIVOLOL HCL 10 MG PO TABS: 10.0000 mg | ORAL_TABLET | Freq: Every day | ORAL | 1 refills | Status: DC

## 2016-06-17 MED ORDER — LOSARTAN POTASSIUM-HCTZ 100-25 MG PO TABS: 1.0000 | ORAL_TABLET | Freq: Every day | ORAL | 0 refills | Status: DC

## 2016-06-25 ENCOUNTER — Ambulatory Visit (INDEPENDENT_AMBULATORY_CARE_PROVIDER_SITE_OTHER): Payer: Medicare HMO | Admitting: Nurse Practitioner

## 2016-06-25 ENCOUNTER — Encounter: Payer: Self-pay | Admitting: Nurse Practitioner

## 2016-06-25 VITALS — BP 146/88 | HR 89 | Temp 98.2°F | Ht 64.5 in | Wt 206.0 lb

## 2016-06-25 DIAGNOSIS — I10 Essential (primary) hypertension: Secondary | ICD-10-CM | POA: Diagnosis not present

## 2016-06-25 DIAGNOSIS — K1121 Acute sialoadenitis: Secondary | ICD-10-CM

## 2016-06-25 MED ORDER — NEBIVOLOL HCL 10 MG PO TABS: 10.0000 mg | ORAL_TABLET | Freq: Every day | ORAL | 1 refills | Status: DC

## 2016-06-25 MED ORDER — LOSARTAN POTASSIUM-HCTZ 100-25 MG PO TABS: 1.0000 | ORAL_TABLET | Freq: Every day | ORAL | 0 refills | Status: DC

## 2016-06-25 MED ORDER — IBUPROFEN 600 MG PO TABS: 600.0000 mg | ORAL_TABLET | Freq: Three times a day (TID) | ORAL | 0 refills | Status: DC | PRN

## 2016-06-25 MED ORDER — SULFAMETHOXAZOLE-TRIMETHOPRIM 800-160 MG PO TABS: 1.0000 | ORAL_TABLET | Freq: Two times a day (BID) | ORAL | 0 refills | Status: DC

## 2016-06-25 NOTE — Progress Notes (Signed)
Subjective:  Patient ID: Elaine Allen, female    DOB: May 22, 1955  Age: 61 y.o. MRN: [id number]  CC: Mass (notice lump under jaws, discomfort to touch,sore,painful to touch 1 day--hx of 3 parathyroid surgery. 2 meds sent to wrong pharmacy)   HPI Right Jaw pain: Onset yesterday, sudden. Pain with palpation and jaw movement. Denies any injury, no radiation, no rash, no muscle weakness, no change in vision, no recent URI. No immunosuppression medication.  Outpatient Medications Prior to Visit  Medication Sig Dispense Refill  . losartan-hydrochlorothiazide (HYZAAR) 100-25 MG tablet Take 1 tablet by mouth daily. 90 tablet 0  . nebivolol (BYSTOLIC) 10 MG tablet Take 1 tablet (10 mg total) by mouth daily. 30 tablet 1   No facility-administered medications prior to visit.     ROS Review of Systems  Constitutional: Negative for chills and fever.  HENT: Negative for congestion, hearing loss, sinus pain, sore throat and tinnitus.   Eyes: Negative for blurred vision and pain.  Respiratory: Negative for shortness of breath.   Cardiovascular: Negative for chest pain and claudication.  Musculoskeletal: Negative for falls, myalgias and neck pain.  Skin: Negative.   Neurological: Negative for dizziness, tingling, focal weakness, loss of consciousness, weakness and headaches.    Objective:  BP (!) 146/88   Pulse 89   Temp 98.2 F (36.8 C)   Ht 5' 4.5" (1.638 m)   Wt 206 lb (93.4 kg)   SpO2 99%   BMI 34.81 kg/m   BP Readings from Last 3 Encounters:  06/25/16 (!) 146/88  06/05/16 138/90  05/05/16 124/76    Wt Readings from Last 3 Encounters:  06/25/16 206 lb (93.4 kg)  06/05/16 202 lb (91.6 kg)  05/05/16 204 lb (92.5 kg)    Physical Exam  Constitutional: She is oriented to person, place, and time. No distress.  HENT:  Head: Normocephalic.    Right Ear: External ear normal.  Left Ear: External ear normal.  Nose: Nose normal.  Mouth/Throat: Uvula is midline, oropharynx  is clear and moist and mucous membranes are normal. She does not have dentures. No trismus in the jaw. Normal dentition. No oropharyngeal exudate or posterior oropharyngeal erythema.  Eyes: Conjunctivae and EOM are normal. Pupils are equal, round, and reactive to light. No scleral icterus.  Neck: Normal range of motion. Neck supple. No thyromegaly present.  Cardiovascular: Normal rate.   Pulmonary/Chest: Effort normal. No stridor.  Lymphadenopathy:    She has no cervical adenopathy.  Neurological: She is alert and oriented to person, place, and time.  Skin: Skin is warm and dry. No erythema.  Vitals reviewed.   Lab Results  Component Value Date   WBC 3.5 (L) 05/05/2016   HGB 13.7 05/05/2016   HCT 40.3 05/05/2016   PLT 92.0 (L) 05/05/2016   GLUCOSE 110 (H) 05/05/2016   CHOL 177 11/12/2014   TRIG 101.0 11/12/2014   HDL 36.80 (L) 11/12/2014   LDLCALC 120 (H) 11/12/2014   ALT 59 (H) 05/05/2016   AST 53 (H) 05/05/2016   NA 139 05/05/2016   K 3.6 05/05/2016   CL 102 05/05/2016   CREATININE 0.88 05/05/2016   BUN 18 05/05/2016   CO2 29 05/05/2016   TSH 2.18 05/05/2016   INR 1.02 05/14/2013   HGBA1C 6.2 10/01/2015    Ct Head Wo Contrast  Result Date: 05/14/2013 CLINICAL DATA:  Fall. Blunt trauma to back of head. Dizziness and nausea. EXAM: CT HEAD WITHOUT CONTRAST TECHNIQUE: Contiguous axial images were obtained from  the base of the skull through the vertex without intravenous contrast. COMPARISON:  None. FINDINGS: There is no evidence of intracranial hemorrhage, brain edema, or other signs of acute infarction. There is no evidence of intracranial mass lesion or mass effect. No abnormal extraaxial fluid collections are identified. Mild central cerebral atrophy is noted. Persistent cavum septum pellucidum and cavum vergae noted. No evidence hydrocephalus. No evidence of skull fracture or other significant bone abnormality. IMPRESSION: No acute intracranial abnormality. Mild cerebral  atrophy. Electronically Signed   By: Myles RosenthalJohn  Stahl M.D.   On: 05/14/2013 14:26   Mr Maxine GlennMra Head Wo Contrast  Result Date: 05/14/2013 CLINICAL DATA:  Acute onset of dizziness and severe vertigo with falling. Question stroke. EXAM: MRI HEAD WITHOUT CONTRAST MRA HEAD WITHOUT CONTRAST TECHNIQUE: Multiplanar, multiecho pulse sequences of the brain and surrounding structures were obtained without intravenous contrast. Angiographic images of the head were obtained using MRA technique without contrast. COMPARISON:  None. FINDINGS: MRI HEAD FINDINGS Multi focal restricted diffusion is evident within the inferior left cerebellum compatible with an acute nonhemorrhagic left PICA territory infarct. No other acute infarct is present. No hemorrhage or mass lesion is present. T2 changes are present in association with the infarct. Mild periventricular and subcortical white matter changes are noted as well. Incidental note is made of a persistent cavum septum pellucidum et vergae. Mild generalized atrophy is evident. The ventricles are proportionate to the degree of atrophy. No significant extra-axial fluid collection is present. The left vertebral artery is occluded. Flow is present in the right vertebral artery. Flow is present in the anterior circulation. Scleral banding is noted at the right globe. The globes are mildly enlarged bilaterally. The orbits are otherwise intact. A fluid level is present in the right maxillary sinus. Mild mucosal thickening is present in the ethmoid air cells bilaterally, right greater than left. A left mastoid effusion is present. No obstructing nasopharyngeal lesion is evident. MRA HEAD FINDINGS The left vertebral artery occlusion is confirmed. This may be an acute dissection. The right vertebral artery is normal. The right PICA origin is within normal limits. The basilar artery is unremarkable. Both posterior cerebral arteries originate from the basilar tip. The internal carotid arteries are within  normal limits from the high cervical segments through the ICA termini bilaterally. The A1 and M1 segments are normal. The anterior communicating artery is patent. The MCA bifurcations are within normal limits bilaterally. ACA and MCA branch vessels are unremarkable. IMPRESSION: 1. Acute nonhemorrhagic left PICA territory infarct. 2. Occluded left vertebral artery. This likely represents an acute dissection. 3. Bupthalmos.  Status post banding of the right globe. Critical Value/emergent results were called by telephone at the time of interpretation on 05/14/2013 at 5:50 PM to Dr. Devoria AlbeIVA KNAPP , who verbally acknowledged these results. Electronically Signed   By: Gennette Pachris  Mattern M.D.   On: 05/14/2013 18:00   Mr Brain Wo Contrast  Result Date: 05/14/2013 CLINICAL DATA:  Acute onset of dizziness and severe vertigo with falling. Question stroke. EXAM: MRI HEAD WITHOUT CONTRAST MRA HEAD WITHOUT CONTRAST TECHNIQUE: Multiplanar, multiecho pulse sequences of the brain and surrounding structures were obtained without intravenous contrast. Angiographic images of the head were obtained using MRA technique without contrast. COMPARISON:  None. FINDINGS: MRI HEAD FINDINGS Multi focal restricted diffusion is evident within the inferior left cerebellum compatible with an acute nonhemorrhagic left PICA territory infarct. No other acute infarct is present. No hemorrhage or mass lesion is present. T2 changes are present in association with the  infarct. Mild periventricular and subcortical white matter changes are noted as well. Incidental note is made of a persistent cavum septum pellucidum et vergae. Mild generalized atrophy is evident. The ventricles are proportionate to the degree of atrophy. No significant extra-axial fluid collection is present. The left vertebral artery is occluded. Flow is present in the right vertebral artery. Flow is present in the anterior circulation. Scleral banding is noted at the right globe. The globes  are mildly enlarged bilaterally. The orbits are otherwise intact. A fluid level is present in the right maxillary sinus. Mild mucosal thickening is present in the ethmoid air cells bilaterally, right greater than left. A left mastoid effusion is present. No obstructing nasopharyngeal lesion is evident. MRA HEAD FINDINGS The left vertebral artery occlusion is confirmed. This may be an acute dissection. The right vertebral artery is normal. The right PICA origin is within normal limits. The basilar artery is unremarkable. Both posterior cerebral arteries originate from the basilar tip. The internal carotid arteries are within normal limits from the high cervical segments through the ICA termini bilaterally. The A1 and M1 segments are normal. The anterior communicating artery is patent. The MCA bifurcations are within normal limits bilaterally. ACA and MCA branch vessels are unremarkable. IMPRESSION: 1. Acute nonhemorrhagic left PICA territory infarct. 2. Occluded left vertebral artery. This likely represents an acute dissection. 3. Bupthalmos.  Status post banding of the right globe. Critical Value/emergent results were called by telephone at the time of interpretation on 05/14/2013 at 5:50 PM to Dr. Devoria Albe , who verbally acknowledged these results. Electronically Signed   By: Gennette Pac M.D.   On: 05/14/2013 18:00    Assessment & Plan:   Hazeline was seen today for mass.  Diagnoses and all orders for this visit:  Parotitis, acute -     sulfamethoxazole-trimethoprim (BACTRIM DS,SEPTRA DS) 800-160 MG tablet; Take 1 tablet by mouth 2 (two) times daily. With food -     ibuprofen (ADVIL,MOTRIN) 600 MG tablet; Take 1 tablet (600 mg total) by mouth every 8 (eight) hours as needed.  Essential hypertension, benign -     nebivolol (BYSTOLIC) 10 MG tablet; Take 1 tablet (10 mg total) by mouth daily. -     losartan-hydrochlorothiazide (HYZAAR) 100-25 MG tablet; Take 1 tablet by mouth daily.   I am having Ms.  Zachow start on sulfamethoxazole-trimethoprim and ibuprofen. I am also having her maintain her nebivolol and losartan-hydrochlorothiazide.  Meds ordered this encounter  Medications  . sulfamethoxazole-trimethoprim (BACTRIM DS,SEPTRA DS) 800-160 MG tablet    Sig: Take 1 tablet by mouth 2 (two) times daily. With food    Dispense:  14 tablet    Refill:  0    Order Specific Question:   Supervising Provider    Answer:   Pincus Sanes [[id number]]  . ibuprofen (ADVIL,MOTRIN) 600 MG tablet    Sig: Take 1 tablet (600 mg total) by mouth every 8 (eight) hours as needed.    Dispense:  30 tablet    Refill:  0    Order Specific Question:   Supervising Provider    Answer:   Pincus Sanes [[id number]]  . nebivolol (BYSTOLIC) 10 MG tablet    Sig: Take 1 tablet (10 mg total) by mouth daily.    Dispense:  30 tablet    Refill:  1  . losartan-hydrochlorothiazide (HYZAAR) 100-25 MG tablet    Sig: Take 1 tablet by mouth daily.    Dispense:  90 tablet    Refill:  0    Follow-up: Return if symptoms worsen or fail to improve.  Alysia Penna, NP

## 2016-06-25 NOTE — Patient Instructions (Signed)
Parotitis Parotitis is irritation and swelling (inflammation) of one or both of your parotid glands. These glands produce saliva. They are found on each side of your face, below and in front of your earlobes. The saliva that they produce comes out of tiny openings (ducts) inside your cheeks. Parotitis may cause sudden swelling and pain (acute parotitis). It can also cause repeated episodes of swelling and pain or continued swelling that may or may not be painful (chronic parotitis). What are the causes? Causes of this condition include:  Bacterial infections.  Viral infections, such as mumps and HIV.  Blockage (obstruction) of saliva flow through the parotid glands. This can be from a stone, scar tissue, or tumor.  Diseases that cause your body's defense system to attack salivary glands and cause inflammation (autoimmune diseases).  What increases the risk? This condition is more likely to develop in:  People who are 50 or older.  People who do not drink enough fluids (dehydration).  People who drink too much alcohol.  People who have a dry mouth.  People who have poor dental hygiene.  People who have diabetes.  People who have gout.  People who have a long-term illness.  People who have had X-ray treatments to the head and neck.  People who take certain medicines.  What are the signs or symptoms? Symptoms of this condition depend on the cause. Symptoms may include:  Swelling under and in front of the ear. This may get worse after eating.  Redness of the skin over the parotid gland.  Pain and tenderness over the parotid gland. This may get worse after eating.  Fever or chills.  Pus coming from the ducts inside the mouth.  Dry mouth.  A bad taste in the mouth.  How is this diagnosed? This condition is diagnosed with a medical history and physical exam. You may also have tests to find the cause of parotitis. These tests may include:  Doing blood tests to check  for autoimmune disease or a viral infection.  Taking a sample of fluid from the parotid gland to test for infection.  Injecting the ducts of the gland with a dye before taking X-rays (sialogram).  Having other imaging studies of the gland, including X-rays, ultrasound, MRI, or CT scan.  Checking the opening of the gland for a stone or obstruction.  Placing a needle into the gland to remove tissue for a biopsy (fine needle aspiration).  How is this treated? Treatment for this condition depends on the cause. Treatment may include:  Antibiotic medicine for a bacterial infection.  Drinking more fluids.  Removing a stone or obstruction.  Treating an underlying disease that is causing parotitis.  Surgery to drain an infection, remove a growth, or remove the whole gland (parotidectomy).  Treatment may not be needed if parotid swelling goes away with home care. Follow these instructions at home: Medicines  Take over-the-counter and prescription medicines only as told by your health care provider.  If you were prescribed an antibiotic, take it as told by your health care provider. Do not stop taking the antibiotic even if you start to feel better. Managing pain and swelling  Apply warm compresses to the affected area as told by your health care provider.  Gently massage the parotid glands as told by your health care provider. General instructions   Drink enough fluid to keep your urine clear or pale yellow.  Try sucking on sour candy. This may help to make your mouth less dry and   may stimulate the flow of saliva.  Keep your mouth clean and moist. Gargle with a salt-water mixture 3-4 times per day, or as needed. To make a salt-water mixture, completely dissolve -1 tsp of salt in 1 cup of warm water.  Maintain good oral health. ? Brush your teeth at least two times per day. ? Floss your teeth every day. ? See your dentist regularly.  Do not use tobacco products, including  cigarettes, chewing tobacco, or e-cigarettes. If you need help quitting, ask your health care provider.  Keep all follow-up visits as told by your health care provider. This is important. Contact a health care provider if:  You have a fever or chills.  You have new symptoms.  Your symptoms get worse.  Your symptoms do not improve with treatment. This information is not intended to replace advice given to you by your health care provider. Make sure you discuss any questions you have with your health care provider. Document Released: 06/13/2001 Document Revised: 05/30/2015 Document Reviewed: 05/17/2014 Elsevier Interactive Patient Education  2018 [employer]  

## 2016-07-07 ENCOUNTER — Ambulatory Visit: Payer: Medicare HMO | Admitting: Internal Medicine

## 2016-10-09 DIAGNOSIS — H5212 Myopia, left eye: Secondary | ICD-10-CM | POA: Diagnosis not present

## 2016-10-28 ENCOUNTER — Encounter: Payer: Self-pay | Admitting: Internal Medicine

## 2016-10-28 ENCOUNTER — Other Ambulatory Visit (INDEPENDENT_AMBULATORY_CARE_PROVIDER_SITE_OTHER): Payer: Medicare HMO

## 2016-10-28 ENCOUNTER — Ambulatory Visit (INDEPENDENT_AMBULATORY_CARE_PROVIDER_SITE_OTHER)
Admission: RE | Admit: 2016-10-28 | Discharge: 2016-10-28 | Disposition: A | Discharge status: Home / Self Care | Payer: Medicare HMO | Source: Ambulatory Visit | Attending: Internal Medicine | Admitting: Internal Medicine

## 2016-10-28 ENCOUNTER — Ambulatory Visit (INDEPENDENT_AMBULATORY_CARE_PROVIDER_SITE_OTHER): Payer: Medicare HMO | Admitting: Internal Medicine

## 2016-10-28 VITALS — BP 162/92 | HR 90 | Temp 98.5°F | Ht 64.5 in | Wt 202.0 lb

## 2016-10-28 DIAGNOSIS — Z23 Encounter for immunization: Secondary | ICD-10-CM

## 2016-10-28 DIAGNOSIS — M25552 Pain in left hip: Secondary | ICD-10-CM

## 2016-10-28 DIAGNOSIS — M329 Systemic lupus erythematosus, unspecified: Secondary | ICD-10-CM | POA: Diagnosis not present

## 2016-10-28 DIAGNOSIS — R1030 Lower abdominal pain, unspecified: Secondary | ICD-10-CM

## 2016-10-28 DIAGNOSIS — M1612 Unilateral primary osteoarthritis, left hip: Secondary | ICD-10-CM | POA: Diagnosis not present

## 2016-10-28 LAB — URINALYSIS, ROUTINE W REFLEX MICROSCOPIC
Bilirubin Urine: NEGATIVE
Hgb urine dipstick: NEGATIVE
Ketones, ur: NEGATIVE
Leukocytes, UA: NEGATIVE
Nitrite: NEGATIVE
RBC / HPF: NONE SEEN
Specific Gravity, Urine: >=1.03 — AB
Urine Glucose: NEGATIVE
Urobilinogen, UA: 1
pH: 5.5 (ref 5.0–8.0)

## 2016-10-28 LAB — COMPREHENSIVE METABOLIC PANEL
ALT: 76 U/L — ABNORMAL HIGH (ref 0–35)
AST: 79 U/L — ABNORMAL HIGH (ref 0–37)
Albumin: 4 g/dL (ref 3.5–5.2)
Alkaline Phosphatase: 107 U/L (ref 39–117)
BUN: 11 mg/dL (ref 6–23)
CO2: 29 mEq/L (ref 19–32)
Calcium: 9.6 mg/dL (ref 8.4–10.5)
Chloride: 103 mEq/L (ref 96–112)
Creatinine, Ser: 0.77 mg/dL (ref 0.40–1.20)
GFR: 97.32 mL/min (ref >60.00)
Glucose, Bld: 108 mg/dL — ABNORMAL HIGH (ref 70–99)
Potassium: 3.9 mEq/L (ref 3.5–5.1)
Sodium: 139 mEq/L (ref 135–145)
Total Bilirubin: 0.6 mg/dL (ref 0.2–1.2)
Total Protein: 8.2 g/dL (ref 6.0–8.3)

## 2016-10-28 MED ORDER — PREDNISONE 20 MG PO TABS: 40.0000 mg | ORAL_TABLET | Freq: Every day | ORAL | 0 refills | Status: DC

## 2016-10-28 NOTE — Progress Notes (Signed)
   Subjective:    Patient ID: Elaine Allen, female    DOB: 21-Jun-1955, 61 y.o.   MRN: [id number]  HPI The patient is a 61 YO female coming in for pain in her lower stomach (started about 2 weeks ago, taken tylenol for the pain which has helped mildly, about 10/10 pain, worse with movement or after lying still for some time, painful to lie on that side), and left hip pain (hurts on the side of her leg, radiates down to the knee, hurts 10/10 pain, no injury or overuse, this is limiting her mobility lately) and her lupus (she is having some skin rash on her face near her eye, has not been to see a rheumatologist recently, is not sure if the pain is related to her lupus or not, she denies fevers or chills).   Review of Systems  Constitutional: Positive for activity change. Negative for chills, diaphoresis, fever and unexpected weight change.  HENT: Negative.   Eyes: Negative.   Respiratory: Negative for cough, chest tightness and shortness of breath.   Cardiovascular: Negative for chest pain, palpitations and leg swelling.  Gastrointestinal: Positive for abdominal pain. Negative for abdominal distention, blood in stool, constipation, diarrhea, nausea and vomiting.  Musculoskeletal: Positive for arthralgias, back pain, gait problem and myalgias.  Skin: Positive for rash.  Neurological: Negative for dizziness, tremors, weakness, light-headedness and headaches.  Psychiatric/Behavioral: Negative.       Objective:   Physical Exam  Constitutional: She is oriented to person, place, and time. She appears well-developed and well-nourished. She appears distressed.  Appears in mild distress  HENT:  Head: Normocephalic and atraumatic.  Eyes: EOM are normal.  Neck: Normal range of motion.  Cardiovascular: Normal rate and regular rhythm.   Pulmonary/Chest: Effort normal and breath sounds normal. No respiratory distress. She has no wheezes. She has no rales.  Abdominal: Soft. Bowel sounds are normal. She  exhibits no distension. There is tenderness. There is no rebound.  Musculoskeletal: She exhibits tenderness. She exhibits no edema.  Left trochanteric bursa with tenderness, no pain in the groin. Pain in the lower left abdomen and around the flank  Neurological: She is alert and oriented to person, place, and time. Coordination abnormal.  Skin: Skin is warm and dry.  Psychiatric: She has a normal mood and affect.   Vitals:   10/28/16 1009  BP: (!) 162/92  Pulse: 90  Temp: 98.5 F (36.9 C)  TempSrc: Oral  SpO2: 100%  Weight: 202 lb (91.6 kg)  Height: 5' 4.5" (1.638 m)      Assessment & Plan:  Flu shot given at visit

## 2016-10-28 NOTE — Patient Instructions (Signed)
We have sent in prednisone to take 2 pills daily for 5 days.   We are checking the urine and labs and x-ray today. If we do not find an explanation we will get you a ct scan of the stomach.   Make a visit with sports medicine to evaluate the hip and leg

## 2016-10-29 ENCOUNTER — Encounter: Payer: Self-pay | Admitting: Internal Medicine

## 2016-10-29 ENCOUNTER — Encounter: Payer: Self-pay | Admitting: Family Medicine

## 2016-10-29 ENCOUNTER — Ambulatory Visit (INDEPENDENT_AMBULATORY_CARE_PROVIDER_SITE_OTHER): Payer: Medicare HMO | Admitting: Family Medicine

## 2016-10-29 VITALS — BP 144/82 | HR 76 | Temp 98.6°F | Ht 64.5 in | Wt 205.0 lb

## 2016-10-29 DIAGNOSIS — M545 Low back pain, unspecified: Secondary | ICD-10-CM

## 2016-10-29 DIAGNOSIS — M25552 Pain in left hip: Secondary | ICD-10-CM

## 2016-10-29 DIAGNOSIS — M169 Osteoarthritis of hip, unspecified: Secondary | ICD-10-CM | POA: Insufficient documentation

## 2016-10-29 NOTE — Assessment & Plan Note (Signed)
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

## 2016-10-29 NOTE — Patient Instructions (Addendum)
Thank you for coming in,   Please try the exercises on a regular basis.   Trying different aquatic exercises can help.   Please follow up with me in 4-6 weeks if there is no improvement.    Please feel free to call with any questions or concerns at any time, at [phone]. --Dr. Jordan LikesSchmitz

## 2016-10-29 NOTE — Assessment & Plan Note (Signed)
Rx for prednisone, if no relief with her stomach and rash and hip pain then we need to pursue alternative diagnoses. She will likely need to get back in with rheumatology soon.

## 2016-10-29 NOTE — Assessment & Plan Note (Signed)
Possible trochanteric bursitis versus lupus flare. Checking hip x-ray for any arthritis or changes. Rx for prednisone for flare of lupus. She will see sports medicine if no improvement for possible injection if needed.

## 2016-10-29 NOTE — Assessment & Plan Note (Signed)
Checking CMP for any kidney or liver problems. If no relief with prednisone will check CT abdomen given the acuity of the condition and need to exclude underlying concerns.

## 2016-10-29 NOTE — Progress Notes (Signed)
Elaine MaltaJill L Allen - 61 y.o. female MRN [id number]  Date of birth: 1955-11-27  SUBJECTIVE:  Including CC & ROS.  Chief Complaint  Patient presents with  . Mid Back and bilateral hip pain    ongoing for 3 weeks. She started taking Prednisone yesterday which she states she did notice a little improvement.     Elaine Allen is a 61 y.o. female that is presenting with back pain.  This pain is acute on chronic in nature.  Seems to be located in her lower back.  The pain but no specific onset or source.  It seems to be intermittent in nature.  The pain is worse with lying on the left side.  She has some pain that goes into her thighs.  Has taken Tylenol for the pain.  Describes the pain is sharp in nature..  Independent review of the left hip x-ray from 10/24 does not show any significant joint disease.  Was seen on 10/24 and started on prednisone.   Review of Systems  Constitutional: Negative for fever.  Musculoskeletal: Positive for back pain. Negative for gait problem and joint swelling.  Skin: Negative for color change.  Neurological: Negative for weakness and numbness.    HISTORY: Past Medical, Surgical, Social, and Family History Reviewed & Updated per EMR.   Pertinent Historical Findings include:  Past Medical History:  Diagnosis Date  . Chronic anemia   . Kidney stones   . Lupus   . Retinal detachment   . Spasmodic dysphonia   . Stroke [facility](HCC) 2015    Past Surgical History:  Procedure Laterality Date  . ABDOMINAL HYSTERECTOMY    . KNEE SURGERY Left   . THYROID SURGERY      Allergies  Allergen Reactions  . Azithromycin Nausea And Vomiting  . Hydroxychloroquine Sulfate Rash and Other (See Comments)    blisters    Family History  Problem Relation Age of Onset  . Cancer Mother        liver  . Diabetes Mother   . Cancer Father        prostate,METS, stomach      Social History   Social History  . Marital status: Single    Spouse name: N/A  . Number of children: 2    . Years of education: college   Occupational History  . options    Social History Main Topics  . Smoking status: Never Smoker  . Smokeless tobacco: Never Used  . Alcohol use No  . Drug use: No  . Sexual activity: Not on file   Other Topics Concern  . Not on file   Social History Narrative  . No narrative on file     PHYSICAL EXAM:  VS: BP (!) 144/82 (BP Location: Left Arm, Patient Position: Sitting, Cuff Size: Large)   Pulse 76   Temp 98.6 F (37 C) (Oral)   Ht 5' 4.5" (1.638 m)   Wt 205 lb (93 kg)   SpO2 98%   BMI 34.64 kg/m  Physical Exam Gen: NAD, alert, cooperative with exam, well-appearing ENT: normal lips, normal nasal mucosa,  Eye: normal EOM, normal conjunctiva and lids CV:  no edema, +2 pedal pulses   Resp: no accessory muscle use, non-labored,  Skin: no rashes, no areas of induration  Neuro: normal tone, normal sensation to touch Psych:  normal insight, alert and oriented MSK:  Back Exam:  Inspection: Unremarkable  No significant tenderness over the greater trochanter bilaterally. No significant tenderness to palpation over  the midline lumbar spine.   Some tenderness to palpation over the lumbar paraspinal muscles. Normal internal and external rotation Some weakness with hip abduction Strength at foot: Plantar-flexion: 5/5 Dorsi-flexion: 5/5 Eversion: 5/5 Inversion: 5/5   Reflexes: 2+ at both patellar tendons  Gait unremarkable. SLR laying: Negative  XSLR laying: Negative  The SI joint does not seem to move fluidly upon testing Normal gait Neurovascularly intact       ASSESSMENT & PLAN:   Back pain SI joint does not appear to move fluidly upon testing.  This could be related to some of her pain.  She does suffer from lupus and unclear if this associated with a flare or not.  -Counseled on home exercise therapy -If no improvement can consider imaging of the lumbar spine versus injection of the SI joint versus physical therapy -Could  consider an MRI if there is any suggestion of spinal stenosis.  Left hip pain Has some weakness with hip abduction which would suggest gluteus medius syndrome -Counseled on home exercise therapy -If no improvement can consider injection therapy versus physical therapy

## 2016-10-29 NOTE — Assessment & Plan Note (Signed)
SI joint does not appear to move fluidly upon testing.  This could be related to some of her pain.  She does suffer from lupus and unclear if this associated with a flare or not.  -Counseled on home exercise therapy -If no improvement can consider imaging of the lumbar spine versus injection of the SI joint versus physical therapy -Could consider an MRI if there is any suggestion of spinal stenosis.

## 2016-11-03 DIAGNOSIS — Z1231 Encounter for screening mammogram for malignant neoplasm of breast: Secondary | ICD-10-CM | POA: Diagnosis not present

## 2016-11-03 LAB — HM MAMMOGRAPHY

## 2016-11-09 ENCOUNTER — Encounter: Payer: Self-pay | Admitting: Internal Medicine

## 2016-12-01 ENCOUNTER — Other Ambulatory Visit: Payer: Self-pay | Admitting: Internal Medicine

## 2016-12-01 MED ORDER — PREDNISONE 20 MG PO TABS: 40.0000 mg | ORAL_TABLET | Freq: Every day | ORAL | 0 refills | Status: DC

## 2017-01-25 DIAGNOSIS — S62662A Nondisplaced fracture of distal phalanx of right middle finger, initial encounter for closed fracture: Secondary | ICD-10-CM | POA: Diagnosis not present

## 2017-01-26 DIAGNOSIS — S62662A Nondisplaced fracture of distal phalanx of right middle finger, initial encounter for closed fracture: Secondary | ICD-10-CM | POA: Diagnosis not present

## 2017-02-19 ENCOUNTER — Ambulatory Visit: Payer: Medicare HMO | Admitting: Family Medicine

## 2017-02-19 ENCOUNTER — Ambulatory Visit: Payer: Self-pay | Admitting: *Deleted

## 2017-02-19 ENCOUNTER — Other Ambulatory Visit (INDEPENDENT_AMBULATORY_CARE_PROVIDER_SITE_OTHER): Payer: Medicare HMO

## 2017-02-19 VITALS — BP 138/76 | HR 112 | Temp 98.2°F | Ht 64.5 in | Wt 201.0 lb

## 2017-02-19 DIAGNOSIS — R7303 Prediabetes: Secondary | ICD-10-CM | POA: Insufficient documentation

## 2017-02-19 DIAGNOSIS — R5383 Other fatigue: Secondary | ICD-10-CM | POA: Diagnosis not present

## 2017-02-19 DIAGNOSIS — M329 Systemic lupus erythematosus, unspecified: Secondary | ICD-10-CM

## 2017-02-19 LAB — COMPREHENSIVE METABOLIC PANEL
ALT: 70 U/L — ABNORMAL HIGH (ref 0–35)
AST: 75 U/L — ABNORMAL HIGH (ref 0–37)
Albumin: 4.3 g/dL (ref 3.5–5.2)
Alkaline Phosphatase: 124 U/L — ABNORMAL HIGH (ref 39–117)
BUN: 12 mg/dL (ref 6–23)
CO2: 29 mEq/L (ref 19–32)
Calcium: 9.5 mg/dL (ref 8.4–10.5)
Chloride: 101 mEq/L (ref 96–112)
Creatinine, Ser: 0.86 mg/dL (ref 0.40–1.20)
GFR: 85.58 mL/min (ref >60.00)
Glucose, Bld: 107 mg/dL — ABNORMAL HIGH (ref 70–99)
Potassium: 3.7 mEq/L (ref 3.5–5.1)
Sodium: 137 mEq/L (ref 135–145)
Total Bilirubin: 0.9 mg/dL (ref 0.2–1.2)
Total Protein: 8.8 g/dL — ABNORMAL HIGH (ref 6.0–8.3)

## 2017-02-19 LAB — HEMOGLOBIN A1C: Hgb A1c MFr Bld: 6.5 % (ref 4.6–6.5)

## 2017-02-19 MED ORDER — PREDNISONE 5 MG PO TABS: ORAL_TABLET | ORAL | 0 refills | Status: DC

## 2017-02-19 NOTE — Assessment & Plan Note (Signed)
Possible that she is having a current flare. We'll check some blood work today - Antiphospholipid, ANA.

## 2017-02-19 NOTE — Assessment & Plan Note (Signed)
Check A1c today.

## 2017-02-19 NOTE — Assessment & Plan Note (Signed)
Unclear as to the source of her fatigue. She has had elevated liver enzymes late last year. Has a history of prediabetes has not been checked again. Has history Lupus which could be contributing an acute flare. - CMP, antiphospholipid - Try prednisone

## 2017-02-19 NOTE — Patient Instructions (Signed)
We will call you with the results from today. Please try the prednisone to see if that improves her symptoms. Please follow-up with us after symptoms do not seem to be improving.

## 2017-02-19 NOTE — Progress Notes (Signed)
Elaine Allen - 62 y.o. female MRN [id number]  Date of birth: [birth year]  SUBJECTIVE:  Including CC & ROS.  Chief Complaint  Patient presents with  . Fatigue    Elaine Allen is a 62 y.o. female that is presenting with fatigue. She states symptoms started last week. She feels tired all the time, unable to get out of bed. Denies dizziness or headaches. No recent medication changes. Admits to shortness of breath.  She has a history of lupus but no formal lab work completed. She had similar symptoms on 5/1 of last year.   Lab work does show elevated liver enzymes on 10/24.    Review of Systems  Constitutional: Positive for activity change. Negative for fever.  HENT: Negative for sore throat.   Respiratory: Negative for cough.   Cardiovascular: Negative for chest pain.  Gastrointestinal: Negative for abdominal pain.  Musculoskeletal: Negative for back pain.  Skin: Negative for color change.  Hematological: Negative for adenopathy.  Psychiatric/Behavioral: Negative for agitation.    HISTORY: Past Medical, Surgical, Social, and Family History Reviewed & Updated per EMR.   Pertinent Historical Findings include:  Past Medical History:  Diagnosis Date  . Chronic anemia   . Kidney stones   . Lupus   . Retinal detachment   . Spasmodic dysphonia   . Stroke [facility](HCC) 2015    Past Surgical History:  Procedure Laterality Date  . ABDOMINAL HYSTERECTOMY    . KNEE SURGERY Left   . THYROID SURGERY      Allergies  Allergen Reactions  . Azithromycin Nausea And Vomiting  . Hydroxychloroquine Sulfate Rash and Other (See Comments)    blisters    Family History  Problem Relation Age of Onset  . Cancer Mother        liver  . Diabetes Mother   . Cancer Father        prostate,METS, stomach      Social History   Socioeconomic History  . Marital status: Single    Spouse name: Not on file  . Number of children: 2  . Years of education: college  . Highest education level: Not on  file  Social Needs  . Financial resource strain: Not on file  . Food insecurity - worry: Not on file  . Food insecurity - inability: Not on file  . Transportation needs - medical: Not on file  . Transportation needs - non-medical: Not on file  Occupational History  . Occupation: options  Tobacco Use  . Smoking status: Never Smoker  . Smokeless tobacco: Never Used  Substance and Sexual Activity  . Alcohol use: No  . Drug use: No  . Sexual activity: Not on file  Other Topics Concern  . Not on file  Social History Narrative  . Not on file     PHYSICAL EXAM:  VS: BP 138/76 (BP Location: Left Arm, Patient Position: Sitting, Cuff Size: Normal)   Pulse (!) 112   Temp 98.2 F (36.8 C) (Oral)   Ht 5' 4.5" (1.638 m)   Wt 201 lb (91.2 kg)   SpO2 99%   BMI 33.97 kg/m  Physical Exam Gen: NAD, alert, cooperative with exam,  ENT: normal lips, normal nasal mucosa, tympanic membranes clear and intact bilaterally, normal oropharynx, no cervical lymphadenopathy Eye: normal EOM, normal conjunctiva and lids CV:  no edema, +2 pedal pulses, regular rhythm, S1-S2, tachycardia   Resp: no accessory muscle use, non-labored, clear to auscultation bilaterally, no crackles or wheezes Skin: no rashes,  no areas of induration  Neuro: normal tone, normal sensation to touch Psych:  normal insight, alert and oriented MSK: Normal gait, normal strength       ASSESSMENT & PLAN:   Fatigue Unclear as to the source of her fatigue. She has had elevated liver enzymes late last year. Has a history of prediabetes has not been checked again. Has history Lupus which could be contributing an acute flare. - CMP, antiphospholipid - Try prednisone  Pre-diabetes Check A1c today.  Lupus (systemic lupus erythematosus) (HCC) Possible that she is having a current flare. We'll check some blood work today - Antiphospholipid, ANA.

## 2017-02-19 NOTE — Telephone Encounter (Signed)
Pt called complaining of severe fatigue 2 weeks; she also says she has to sit down because she can't get breath which started Wednesday 02/18/16; she is concerned about her extreme fatigue because she only is short of breath with exertion; she also complains of having flashes of light in her left eye for about 2 weeks; the flashes last about 2-3 minutes but they go away when she lays down; nurse triage initiated and recommendations made per protocol to include seeing physician within 24 hours; pt offered and [facility] 1130; pt verbalizes understanding.  Reason for Disposition . [1] MODERATE weakness (i.e., interferes with work, school, normal activities) AND [2] persists > 3 days  Answer Assessment - Initial Assessment Questions 1. DESCRIPTION: "Describe how you are feeling."     Extremely tired 2. SEVERITY: "How bad is it?"  "Can you stand and walk?"   - MILD - Feels weak or tired, but does not interfere with work, school or normal activities   - MODERATE - Able to stand and walk; weakness interferes with work, school, or normal activities   - SEVERE - Unable to stand or walk     moderate 3. ONSET:  "When did the weakness begin?"     2 weeks ago 4. CAUSE: "What do you think is causing the weakness?"     unsure 5. MEDICINES: "Have you recently started a new medicine or had a change in the amount of a medicine?"     no 6. OTHER SYMPTOMS: "Do you have any other symptoms?" (e.g., chest pain, fever, cough, SOB, vomiting, diarrhea, bleeding)     Seeing flashes of light in left eye 7. PREGNANCY: "Is there any chance you are pregnant?" "When was your last menstrual period?"     no  Protocols used: WEAKNESS (GENERALIZED) AND FATIGUE-A-AH

## 2017-02-22 LAB — ANTI-NUCLEAR AB-TITER (ANA TITER): ANA Titer 1: 1:160 {titer} — ABNORMAL HIGH

## 2017-02-22 LAB — ANA: Anti Nuclear Antibody(ANA): POSITIVE — AB

## 2017-02-24 ENCOUNTER — Other Ambulatory Visit (INDEPENDENT_AMBULATORY_CARE_PROVIDER_SITE_OTHER): Payer: Medicare HMO

## 2017-02-24 ENCOUNTER — Telehealth: Payer: Self-pay | Admitting: Family Medicine

## 2017-02-24 ENCOUNTER — Ambulatory Visit (INDEPENDENT_AMBULATORY_CARE_PROVIDER_SITE_OTHER): Payer: Medicare HMO | Admitting: Internal Medicine

## 2017-02-24 ENCOUNTER — Encounter: Payer: Self-pay | Admitting: Internal Medicine

## 2017-02-24 VITALS — BP 124/82 | HR 86 | Temp 98.8°F | Ht 64.5 in | Wt 205.0 lb

## 2017-02-24 DIAGNOSIS — M329 Systemic lupus erythematosus, unspecified: Secondary | ICD-10-CM

## 2017-02-24 DIAGNOSIS — R945 Abnormal results of liver function studies: Secondary | ICD-10-CM

## 2017-02-24 DIAGNOSIS — R7989 Other specified abnormal findings of blood chemistry: Secondary | ICD-10-CM

## 2017-02-24 LAB — CBC
HCT: 40.3 % (ref 36.0–46.0)
Hemoglobin: 13.4 g/dL (ref 12.0–15.0)
MCHC: 33.2 g/dL (ref 30.0–36.0)
MCV: 90.3 fl (ref 78.0–100.0)
Platelets: 138 10*3/uL — ABNORMAL LOW (ref 150.0–400.0)
RBC: 4.47 Mil/uL (ref 3.87–5.11)
RDW: 13.4 % (ref 11.5–15.5)
WBC: 2.5 10*3/uL — ABNORMAL LOW (ref 4.0–10.5)

## 2017-02-24 LAB — FERRITIN: Ferritin: 564.7 ng/mL — ABNORMAL HIGH (ref 10.0–291.0)

## 2017-02-24 NOTE — Patient Instructions (Addendum)
We will check the labs today for the liver and call you back about the results. We have also ordered the ultrasound and will have you talk to a GI specialist.   The ANA was positive from before so you likely do have the lupus.

## 2017-02-24 NOTE — Progress Notes (Signed)
   Subjective:    Patient ID: Elaine Allen, female    DOB: 1955/09/30, 62 y.o.   MRN: [id number]  HPI The patient is a 62 YO female coming in for elevations in liver enzymes. She does have concurrent lupus which she does not perceive to be active at this time. She is not on medication for her lupus. Her liver numbers have been chronically elevated in the past but more elevated in the last 3 months or so. She denies tylenol usage. Does not drink alcohol much but had 1-2 glasses of wine at a wedding twice in the last 6 months. Denies otc herbals or pain medications. Does have fatigue which is worse lately. Prior US 2012 with fatty liver.   Review of Systems  Constitutional: Positive for fatigue. Negative for activity change, appetite change, fever and unexpected weight change.  HENT: Negative.   Eyes: Negative.   Respiratory: Negative for cough, chest tightness and shortness of breath.   Cardiovascular: Negative for chest pain, palpitations and leg swelling.  Gastrointestinal: Negative for abdominal distention, abdominal pain, constipation, diarrhea, nausea and vomiting.  Musculoskeletal: Negative.   Skin: Negative.   Neurological: Negative.   Psychiatric/Behavioral: Negative.       Objective:   Physical Exam  Constitutional: She is oriented to person, place, and time. She appears well-developed and well-nourished.  HENT:  Head: Normocephalic and atraumatic.  Eyes: EOM are normal.  Neck: Normal range of motion.  Cardiovascular: Normal rate and regular rhythm.  Pulmonary/Chest: Effort normal and breath sounds normal. No respiratory distress. She has no wheezes. She has no rales.  Abdominal: Soft. Bowel sounds are normal. She exhibits no distension. There is no tenderness. There is no rebound.  Musculoskeletal: She exhibits no edema.  Neurological: She is alert and oriented to person, place, and time. Coordination normal.  Skin: Skin is warm and dry.  Psychiatric: She has a normal mood  and affect.   Vitals:   02/24/17 0858  BP: 124/82  Pulse: 86  Temp: 98.8 F (37.1 C)  TempSrc: Oral  SpO2: 98%  Weight: 205 lb (93 kg)  Height: 5' 4.5" (1.638 m)      Assessment & Plan:

## 2017-02-24 NOTE — Telephone Encounter (Signed)
Left VM for patient. If she calls back please have her speak with a nurse/CMA and inform that her ANA was positive and her ANA titer was of a high concentration. I would recommend that she see rheumatology to see if there is any other chronic medication that they may encourage as well as further testing. I will make this referral..   If any questions then please take the best time and phone number to call and I will try to call her back.   Myra RudeSchmitz, Jocee Kissick E, MD [facility] 02/24/2017, 9:29 AM

## 2017-02-24 NOTE — Telephone Encounter (Signed)
Spoke with pt and gave information. She is agreeable to rheumatology referral. She had no further questions and extends her thanks to Dr. Jordan LikesSchmitz

## 2017-02-24 NOTE — Assessment & Plan Note (Signed)
Suspect multifactorial. She does have lupus which has a large occurrence of abnormal LFTs. Last numbers around 2 times normal. Checking hepatitis acute and hep b immunity. If not immune needs vaccination. Checking ferritin, autoimmune labs. She may need liver biopsy and referring to GI for evaluation. These levels have been stable for 5-6 months. Checking US abdomen for any progression of fatty liver disease from 2012.

## 2017-02-27 LAB — ANA+ENA+DNA/DS+ANTICH+CENTR
ANA Titer 1: POSITIVE — AB
Anti JO-1: <0.2 AI (ref 0.0–0.9)
Centromere Ab Screen: <0.2 AI (ref 0.0–0.9)
Chromatin Ab SerPl-aCnc: 0.3 AI (ref 0.0–0.9)
ENA RNP Ab: <0.2 AI (ref 0.0–0.9)
ENA SM Ab Ser-aCnc: <0.2 AI (ref 0.0–0.9)
ENA SSA (RO) Ab: >8 AI — ABNORMAL HIGH (ref 0.0–0.9)
ENA SSB (LA) Ab: <0.2 AI (ref 0.0–0.9)
Scleroderma SCL-70: <0.2 AI (ref 0.0–0.9)
dsDNA Ab: 7 IU/mL (ref 0–9)

## 2017-02-27 LAB — FANA STAINING PATTERNS
Homogeneous Pattern: 1:1280 {titer} — ABNORMAL HIGH
Speckled Pattern: 1:160 {titer} — ABNORMAL HIGH

## 2017-03-01 LAB — HEPATITIS PANEL, ACUTE
Hep A IgM: NONREACTIVE
Hep B C IgM: NONREACTIVE
Hepatitis B Surface Ag: NONREACTIVE
Hepatitis C Ab: NONREACTIVE
SIGNAL TO CUT-OFF: 0.03 (ref <1.00)

## 2017-03-01 LAB — ANTI-SMOOTH MUSCLE ANTIBODY, IGG: Actin (Smooth Muscle) Antibody (IGG): <20 U (ref <20)

## 2017-03-01 LAB — HEPATITIS B SURFACE ANTIBODY,QUALITATIVE: Hep B S Ab: BORDERLINE — AB

## 2017-03-08 ENCOUNTER — Ambulatory Visit (INDEPENDENT_AMBULATORY_CARE_PROVIDER_SITE_OTHER): Payer: Medicare HMO

## 2017-03-08 DIAGNOSIS — Z23 Encounter for immunization: Secondary | ICD-10-CM

## 2017-03-08 DIAGNOSIS — Z299 Encounter for prophylactic measures, unspecified: Secondary | ICD-10-CM

## 2017-03-11 ENCOUNTER — Ambulatory Visit: Payer: Medicare HMO | Admitting: Family Medicine

## 2017-03-11 NOTE — Progress Notes (Deleted)
  Anderson MaltaJill L Bartus - 62 y.o. female MRN [id number]  Date of birth: Jan 17, 1955  SUBJECTIVE:  Including CC & ROS.  No chief complaint on file.   Anderson MaltaJill L Holton is a 62 y.o. female that is  ***.  ***   Review of Systems  HISTORY: Past Medical, Surgical, Social, and Family History Reviewed & Updated per EMR.   Pertinent Historical Findings include:  Past Medical History:  Diagnosis Date  . Chronic anemia   . Kidney stones   . Lupus   . Retinal detachment   . Spasmodic dysphonia   . Stroke [facility](HCC) 2015    Past Surgical History:  Procedure Laterality Date  . ABDOMINAL HYSTERECTOMY    . KNEE SURGERY Left   . THYROID SURGERY      Allergies  Allergen Reactions  . Azithromycin Nausea And Vomiting  . Hydroxychloroquine Sulfate Rash and Other (See Comments)    blisters    Family History  Problem Relation Age of Onset  . Cancer Mother        liver  . Diabetes Mother   . Cancer Father        prostate,METS, stomach      Social History   Socioeconomic History  . Marital status: Single    Spouse name: Not on file  . Number of children: 2  . Years of education: college  . Highest education level: Not on file  Social Needs  . Financial resource strain: Not on file  . Food insecurity - worry: Not on file  . Food insecurity - inability: Not on file  . Transportation needs - medical: Not on file  . Transportation needs - non-medical: Not on file  Occupational History  . Occupation: options  Tobacco Use  . Smoking status: Never Smoker  . Smokeless tobacco: Never Used  Substance and Sexual Activity  . Alcohol use: No  . Drug use: No  . Sexual activity: Not on file  Other Topics Concern  . Not on file  Social History Narrative  . Not on file     PHYSICAL EXAM:  VS: There were no vitals taken for this visit. Physical Exam Gen: NAD, alert, cooperative with exam, well-appearing ENT: normal lips, normal nasal mucosa,  Eye: normal EOM, normal conjunctiva and lids CV:   no edema, +2 pedal pulses   Resp: no accessory muscle use, non-labored,  GI: no masses or tenderness, no hernia  Skin: no rashes, no areas of induration  Neuro: normal tone, normal sensation to touch Psych:  normal insight, alert and oriented MSK:  ***      ASSESSMENT & PLAN:   No problem-specific Assessment & Plan notes found for this encounter.

## 2017-03-17 ENCOUNTER — Ambulatory Visit
Admission: RE | Admit: 2017-03-17 | Discharge: 2017-03-17 | Disposition: A | Discharge status: Home / Self Care | Payer: Medicare HMO | Source: Ambulatory Visit | Attending: Internal Medicine | Admitting: Internal Medicine

## 2017-03-17 DIAGNOSIS — R7989 Other specified abnormal findings of blood chemistry: Secondary | ICD-10-CM

## 2017-03-17 DIAGNOSIS — R945 Abnormal results of liver function studies: Principal | ICD-10-CM

## 2017-03-17 DIAGNOSIS — K760 Fatty (change of) liver, not elsewhere classified: Secondary | ICD-10-CM | POA: Diagnosis not present

## 2017-05-11 ENCOUNTER — Telehealth: Payer: Self-pay | Admitting: Internal Medicine

## 2017-05-11 ENCOUNTER — Ambulatory Visit: Payer: Medicare HMO | Admitting: Internal Medicine

## 2017-05-11 NOTE — Telephone Encounter (Signed)
No charge per Dr. Gessner. 

## 2017-06-11 ENCOUNTER — Encounter: Payer: Self-pay | Admitting: Internal Medicine

## 2017-06-22 ENCOUNTER — Ambulatory Visit (INDEPENDENT_AMBULATORY_CARE_PROVIDER_SITE_OTHER): Payer: Medicare HMO | Admitting: *Deleted

## 2017-06-22 VITALS — BP 132/78 | HR 95 | Resp 18 | Ht 65.0 in | Wt 197.0 lb

## 2017-06-22 DIAGNOSIS — I10 Essential (primary) hypertension: Secondary | ICD-10-CM

## 2017-06-22 DIAGNOSIS — Z0000 Encounter for general adult medical examination without abnormal findings: Secondary | ICD-10-CM | POA: Diagnosis not present

## 2017-06-22 MED ORDER — LOSARTAN POTASSIUM-HCTZ 100-25 MG PO TABS: 1.0000 | ORAL_TABLET | Freq: Every day | ORAL | 0 refills | Status: DC

## 2017-06-22 NOTE — Patient Instructions (Signed)
Continue doing brain stimulating activities (puzzles, reading, adult coloring books, staying active) to keep memory sharp.   Continue to eat heart healthy diet (full of fruits, vegetables, whole grains, lean protein, water--limit salt, fat, and sugar intake) and increase physical activity as tolerated.   Elaine Allen , Thank you for taking time to come for your Medicare Wellness Visit. I appreciate your ongoing commitment to your health goals. Please review the following plan we discussed and let me know if I can assist you in the future.   These are the goals we discussed: Goals    . Patient Stated     Continue to loose weight by drinking lemon water, monitoring my diet, and exercise. Enjoy life and family, and go to the beach.       This is a list of the screening recommended for you and due dates:  Health Maintenance  Topic Date Due  . HIV Screening  08/31/1970  . Flu Shot  08/05/2017  . Mammogram  11/04/2018  . Tetanus Vaccine  12/04/2024  . Colon Cancer Screening  01/15/2025  .  Hepatitis C: One time screening is recommended by Center for Disease Control  (CDC) for  adults born from [birth year] through 1967.   Completed   Health Maintenance, Female Adopting a healthy lifestyle and getting preventive care can go a long way to promote health and wellness. Talk with your health care provider about what schedule of regular examinations is right for you. This is a good chance for you to check in with your provider about disease prevention and staying healthy. In between checkups, there are plenty of things you can do on your own. Experts have done a lot of research about which lifestyle changes and preventive measures are most likely to keep you healthy. Ask your health care provider for more information. Weight and diet Eat a healthy diet  Be sure to include plenty of vegetables, fruits, low-fat dairy products, and lean protein.  Do not eat a lot of foods high in solid fats, added sugars,  or salt.  Get regular exercise. This is one of the most important things you can do for your health. ? Most adults should exercise for at least 150 minutes each week. The exercise should increase your heart rate and make you sweat (moderate-intensity exercise). ? Most adults should also do strengthening exercises at least twice a week. This is in addition to the moderate-intensity exercise.  Maintain a healthy weight  Body mass index (BMI) is a measurement that can be used to identify possible weight problems. It estimates body fat based on height and weight. Your health care provider can help determine your BMI and help you achieve or maintain a healthy weight.  For females 93 years of age and older: ? A BMI below 18.5 is considered underweight. ? A BMI of 18.5 to 24.9 is normal. ? A BMI of 25 to 29.9 is considered overweight. ? A BMI of 30 and above is considered obese.  Watch levels of cholesterol and blood lipids  You should start having your blood tested for lipids and cholesterol at 64 years of age, then have this test every 5 years.  You may need to have your cholesterol levels checked more often if: ? Your lipid or cholesterol levels are high. ? You are older than 64 years of age. ? You are at high risk for heart disease.  Cancer screening Lung Cancer  Lung cancer screening is recommended for adults 82-61 years old  who are at high risk for lung cancer because of a history of smoking.  A yearly low-dose CT scan of the lungs is recommended for people who: ? Currently smoke. ? Have quit within the past 15 years. ? Have at least a 30-pack-year history of smoking. A pack year is smoking an average of one pack of cigarettes a day for 1 year.  Yearly screening should continue until it has been 15 years since you quit.  Yearly screening should stop if you develop a health problem that would prevent you from having lung cancer treatment.  Breast Cancer  Practice breast  self-awareness. This means understanding how your breasts normally appear and feel.  It also means doing regular breast self-exams. Let your health care provider know about any changes, no matter how small.  If you are in your 20s or 30s, you should have a clinical breast exam (CBE) by a health care provider every 1-3 years as part of a regular health exam.  If you are 75 or older, have a CBE every year. Also consider having a breast X-ray (mammogram) every year.  If you have a family history of breast cancer, talk to your health care provider about genetic screening.  If you are at high risk for breast cancer, talk to your health care provider about having an MRI and a mammogram every year.  Breast cancer gene (BRCA) assessment is recommended for women who have family members with BRCA-related cancers. BRCA-related cancers include: ? Breast. ? Ovarian. ? Tubal. ? Peritoneal cancers.  Results of the assessment will determine the need for genetic counseling and BRCA1 and BRCA2 testing.  Cervical Cancer Your health care provider may recommend that you be screened regularly for cancer of the pelvic organs (ovaries, uterus, and vagina). This screening involves a pelvic examination, including checking for microscopic changes to the surface of your cervix (Pap test). You may be encouraged to have this screening done every 3 years, beginning at age 23.  For women ages 75-65, health care providers may recommend pelvic exams and Pap testing every 3 years, or they may recommend the Pap and pelvic exam, combined with testing for human papilloma virus (HPV), every 5 years. Some types of HPV increase your risk of cervical cancer. Testing for HPV may also be done on women of any age with unclear Pap test results.  Other health care providers may not recommend any screening for nonpregnant women who are considered low risk for pelvic cancer and who do not have symptoms. Ask your health care provider if a  screening pelvic exam is right for you.  If you have had past treatment for cervical cancer or a condition that could lead to cancer, you need Pap tests and screening for cancer for at least 20 years after your treatment. If Pap tests have been discontinued, your risk factors (such as having a new sexual partner) need to be reassessed to determine if screening should resume. Some women have medical problems that increase the chance of getting cervical cancer. In these cases, your health care provider may recommend more frequent screening and Pap tests.  Colorectal Cancer  This type of cancer can be detected and often prevented.  Routine colorectal cancer screening usually begins at 64 years of age and continues through 64 years of age.  Your health care provider may recommend screening at an earlier age if you have risk factors for colon cancer.  Your health care provider may also recommend using home test kits  to check for hidden blood in the stool.  A small camera at the end of a tube can be used to examine your colon directly (sigmoidoscopy or colonoscopy). This is done to check for the earliest forms of colorectal cancer.  Routine screening usually begins at age 26.  Direct examination of the colon should be repeated every 5-10 years through 64 years of age. However, you may need to be screened more often if early forms of precancerous polyps or small growths are found.  Skin Cancer  Check your skin from head to toe regularly.  Tell your health care provider about any new moles or changes in moles, especially if there is a change in a mole's shape or color.  Also tell your health care provider if you have a mole that is larger than the size of a pencil eraser.  Always use sunscreen. Apply sunscreen liberally and repeatedly throughout the day.  Protect yourself by wearing long sleeves, pants, a wide-brimmed hat, and sunglasses whenever you are outside.  Heart disease, diabetes, and  high blood pressure  High blood pressure causes heart disease and increases the risk of stroke. High blood pressure is more likely to develop in: ? People who have blood pressure in the high end of the normal range (130-139/85-89 mm Hg). ? People who are overweight or obese. ? People who are African American.  If you are 68-71 years of age, have your blood pressure checked every 3-5 years. If you are 3 years of age or older, have your blood pressure checked every year. You should have your blood pressure measured twice-once when you are at a hospital or clinic, and once when you are not at a hospital or clinic. Record the average of the two measurements. To check your blood pressure when you are not at a hospital or clinic, you can use: ? An automated blood pressure machine at a pharmacy. ? A home blood pressure monitor.  If you are between 22 years and 76 years old, ask your health care provider if you should take aspirin to prevent strokes.  Have regular diabetes screenings. This involves taking a blood sample to check your fasting blood sugar level. ? If you are at a normal weight and have a low risk for diabetes, have this test once every three years after 64 years of age. ? If you are overweight and have a high risk for diabetes, consider being tested at a younger age or more often. Preventing infection Hepatitis B  If you have a higher risk for hepatitis B, you should be screened for this virus. You are considered at high risk for hepatitis B if: ? You were born in a country where hepatitis B is common. Ask your health care provider which countries are considered high risk. ? Your parents were born in a high-risk country, and you have not been immunized against hepatitis B (hepatitis B vaccine). ? You have HIV or AIDS. ? You use needles to inject street drugs. ? You live with someone who has hepatitis B. ? You have had sex with someone who has hepatitis B. ? You get hemodialysis  treatment. ? You take certain medicines for conditions, including cancer, organ transplantation, and autoimmune conditions.  Hepatitis C  Blood testing is recommended for: ? Everyone born from [birth year] through 1967. ? Anyone with known risk factors for hepatitis C.  Sexually transmitted infections (STIs)  You should be screened for sexually transmitted infections (STIs) including gonorrhea and chlamydia if: ?  You are sexually active and are younger than 64 years of age. ? You are older than 64 years of age and your health care provider tells you that you are at risk for this type of infection. ? Your sexual activity has changed since you were last screened and you are at an increased risk for chlamydia or gonorrhea. Ask your health care provider if you are at risk.  If you do not have HIV, but are at risk, it may be recommended that you take a prescription medicine daily to prevent HIV infection. This is called pre-exposure prophylaxis (PrEP). You are considered at risk if: ? You are sexually active and do not regularly use condoms or know the HIV status of your partner(s). ? You take drugs by injection. ? You are sexually active with a partner who has HIV.  Talk with your health care provider about whether you are at high risk of being infected with HIV. If you choose to begin PrEP, you should first be tested for HIV. You should then be tested every 3 months for as long as you are taking PrEP. Pregnancy  If you are premenopausal and you may become pregnant, ask your health care provider about preconception counseling.  If you may become pregnant, take 400 to 800 micrograms (mcg) of folic acid every day.  If you want to prevent pregnancy, talk to your health care provider about birth control (contraception). Osteoporosis and menopause  Osteoporosis is a disease in which the bones lose minerals and strength with aging. This can result in serious bone fractures. Your risk for osteoporosis  can be identified using a bone density scan.  If you are 68 years of age or older, or if you are at risk for osteoporosis and fractures, ask your health care provider if you should be screened.  Ask your health care provider whether you should take a calcium or vitamin D supplement to lower your risk for osteoporosis.  Menopause may have certain physical symptoms and risks.  Hormone replacement therapy may reduce some of these symptoms and risks. Talk to your health care provider about whether hormone replacement therapy is right for you. Follow these instructions at home:  Schedule regular health, dental, and eye exams.  Stay current with your immunizations.  Do not use any tobacco products including cigarettes, chewing tobacco, or electronic cigarettes.  If you are pregnant, do not drink alcohol.  If you are breastfeeding, limit how much and how often you drink alcohol.  Limit alcohol intake to no more than 1 drink per day for nonpregnant women. One drink equals 12 ounces of beer, 5 ounces of Jewell Haught, or 1 ounces of hard liquor.  Do not use street drugs.  Do not share needles.  Ask your health care provider for help if you need support or information about quitting drugs.  Tell your health care provider if you often feel depressed.  Tell your health care provider if you have ever been abused or do not feel safe at home. This information is not intended to replace advice given to you by your health care provider. Make sure you discuss any questions you have with your health care provider. Document Released: 07/07/2010 Document Revised: 05/30/2015 Document Reviewed: 09/25/2014 Elsevier Interactive Patient Education  [employer].

## 2017-06-22 NOTE — Progress Notes (Signed)
Subjective:   Elaine Allen is a 62 y.o. female who presents for an Initial Medicare Annual Wellness Visit.  Review of Systems    No ROS.  Medicare Wellness Visit. Additional risk factors are reflected in the social history.   Cardiac Risk Factors include: advanced age (>35men, >69 women);diabetes mellitus;dyslipidemia;hypertension Sleep patterns: gets up 1 times nightly to void and sleeps 7-8 hours nightly.    Home Safety/Smoke Alarms: Feels safe in home. Smoke alarms in place.  Living environment; residence and Firearm Safety: 2-story house, no firearms. Lives with family, no needs for DME, good support system Seat Belt Safety/Bike Helmet: Wears seat belt.      Objective:    Today's Vitals   06/22/17 1639 06/22/17 1651  BP: 132/78   Pulse: 95   Resp: 18   SpO2: 98%   Weight: 197 lb (89.4 kg)   Height: 5\' 5"  (1.651 m)   PainSc:  1    Body mass index is 32.78 kg/m.  Advanced Directives 06/22/2017 01/16/2015 01/02/2015 05/14/2013  Does Patient Have a Medical Advance Directive? No No No Patient does not have advance directive  Does patient want to make changes to medical advance directive? Yes (ED - Information included in AVS) - - -  Pre-existing out of facility DNR order (yellow form or pink MOST form) - - - No    Current Medications (verified) Outpatient Encounter Medications as of 06/22/2017  Medication Sig  . losartan-hydrochlorothiazide (HYZAAR) 100-25 MG tablet Take 1 tablet by mouth daily.  . predniSONE (DELTASONE) 5 MG tablet Take 6 pills for first day, 5 pills second day, 4 pills third day, 3 pills fourth day, 2 pills the fifth day, and 1 pill sixth day.  . [DISCONTINUED] losartan-hydrochlorothiazide (HYZAAR) 100-25 MG tablet Take 1 tablet by mouth daily.   No facility-administered encounter medications on file as of 06/22/2017.     Allergies (verified) Azithromycin and Hydroxychloroquine sulfate   History: Past Medical History:  Diagnosis Date  . Chronic  anemia   . Kidney stones   . Lupus (HCC)   . Retinal detachment   . Spasmodic dysphonia   . Stroke [facility]) 2015   Past Surgical History:  Procedure Laterality Date  . ABDOMINAL HYSTERECTOMY    . KNEE SURGERY Left   . THYROID SURGERY     Family History  Problem Relation Age of Onset  . Cancer Mother        liver  . Diabetes Mother   . Cancer Father        prostate,METS, stomach    Social History   Socioeconomic History  . Marital status: Widowed    Spouse name: Not on file  . Number of children: 2  . Years of education: college  . Highest education level: Not on file  Occupational History  . Occupation: options  Social Needs  . Financial resource strain: Not hard at all  . Food insecurity:    Worry: Never true    Inability: Never true  . Transportation needs:    Medical: No    Non-medical: No  Tobacco Use  . Smoking status: Never Smoker  . Smokeless tobacco: Never Used  Substance and Sexual Activity  . Alcohol use: No  . Drug use: No  . Sexual activity: Not Currently  Lifestyle  . Physical activity:    Days per week: 3 days    Minutes per session: 30 min  . Stress: Not at all  Relationships  . Social connections:  Talks on phone: More than three times a week    Gets together: More than three times a week    Attends religious service: More than 4 times per year    Active member of club or organization: Yes    Attends meetings of clubs or organizations: More than 4 times per year    Relationship status: Widowed  Other Topics Concern  . Not on file  Social History Narrative  . Not on file    Tobacco Counseling Counseling given: Not Answered  Activities of Daily Living In your present state of health, do you have any difficulty performing the following activities: 06/22/2017  Hearing? N  Vision? N  Difficulty concentrating or making decisions? N  Walking or climbing stairs? N  Dressing or bathing? N  Doing errands, shopping? N  Preparing Food and  eating ? N  Using the Toilet? N  In the past six months, have you accidently leaked urine? N  Do you have problems with loss of bowel control? N  Managing your Medications? N  Managing your Finances? N  Housekeeping or managing your Housekeeping? N  Some recent data might be hidden     Immunizations and Health Maintenance Immunization History  Administered Date(s) Administered  . Hep A / Hep B 03/08/2017  . Influenza Whole 12/19/2007  . Influenza,inj,Quad PF,6+ Mos 11/12/2014, 10/01/2015, 10/28/2016  . PPD Test 03/18/2016, 03/23/2016  . Tdap 12/05/2014  . Zoster 01/02/2015   Health Maintenance Due  Topic Date Due  . HIV Screening  08/31/1970    Patient Care Team: Myrlene Brokerrawford, Elizabeth A, MD as PCP - General (Internal Medicine)  Indicate any recent Medical Services you may have received from other than [facility] providers in the past year (date may be approximate).     Assessment:   This is a routine wellness examination for Elaine Allen. Physical assessment deferred to PCP.   Hearing/Vision screen Hearing Screening Comments: Able to hear conversational tones w/o difficulty. No issues reported.  Passed whisper test Vision Screening Comments: [facility]  Dietary issues and exercise activities discussed: Current Exercise Habits: Structured exercise class, Type of exercise: walking;treadmill, Time (Minutes): 45, Frequency (Times/Week): 3, Weekly Exercise (Minutes/Week): 135, Exercise limited by: orthopedic condition(s)  Diet (meal preparation, eat out, water intake, caffeinated beverages, dairy products, fruits and vegetables): in general, a "healthy" diet  , well balanced   Reviewed heart healthy and diabetic diet, Encouraged patient to increase daily water and fluid intake.  Goals    . Patient Stated     Continue to loose weight by drinking lemon water, monitoring my diet, and exercise. Enjoy life and family, and go to the beach.      Depression  Screen PHQ 2/9 Scores 06/22/2017 10/28/2016 11/12/2014  PHQ - 2 Score 0 1 0    Fall Risk Fall Risk  06/22/2017 11/12/2014  Falls in the past year? No No   Cognitive Function:       Ad8 score reviewed for issues:  Issues making decisions: no  Less interest in hobbies / activities: no  Repeats questions, stories (family complaining): no  Trouble using ordinary gadgets (microwave, computer, phone):no  Forgets the month or year: no  Mismanaging finances: no  Remembering appts: no  Daily problems with thinking and/or memory: no Ad8 score is= 0  Screening Tests Health Maintenance  Topic Date Due  . HIV Screening  08/31/1970  . INFLUENZA VACCINE  08/05/2017  . MAMMOGRAM  11/04/2018  . TETANUS/TDAP  12/04/2024  .  COLONOSCOPY  01/15/2025  . Hepatitis C Screening  Completed     Plan:       Continue doing brain stimulating activities (puzzles, reading, adult coloring books, staying active) to keep memory sharp.   Continue to eat heart healthy diet (full of fruits, vegetables, whole grains, lean protein, water--limit salt, fat, and sugar intake) and increase physical activity as tolerated.  I have personally reviewed and noted the following in the patient's chart:   . Medical and social history . Use of alcohol, tobacco or illicit drugs  . Current medications and supplements . Functional ability and status . Nutritional status . Physical activity . Advanced directives . List of other physicians . Vitals . Screenings to include cognitive, depression, and falls . Referrals and appointments  In addition, I have reviewed and discussed with patient certain preventive protocols, quality metrics, and best practice recommendations. A written personalized care plan for preventive services as well as general preventive health recommendations were provided to patient.     Wanda Plump, RN   06/22/2017

## 2017-06-23 NOTE — Progress Notes (Signed)
Medical screening examination/treatment/procedure(s) were performed by non-physician practitioner and as supervising physician I was immediately available for consultation/collaboration. I agree with above. Helen Winterhalter A [place] Nordquist, MD 

## 2017-08-02 ENCOUNTER — Other Ambulatory Visit: Payer: Self-pay | Admitting: Internal Medicine

## 2017-08-02 DIAGNOSIS — R5383 Other fatigue: Secondary | ICD-10-CM

## 2017-08-02 DIAGNOSIS — M329 Systemic lupus erythematosus, unspecified: Secondary | ICD-10-CM

## 2017-08-02 NOTE — Telephone Encounter (Signed)
Copied from CRM [phone]. Topic: Inquiry >> Aug 02, 2017 11:49 AM Yvonna Alanisobinson, Andra M wrote: Reason for CRM: Patient called requesting a refill of predniSONE (DELTASONE) 5 MG tablet. [id number] - [place], [place] - [address] [facility] [address] [phone] (Phone)  [phone] (Fax).       Thank You!!!

## 2017-08-03 MED ORDER — PREDNISONE 20 MG PO TABS: 40.0000 mg | ORAL_TABLET | Freq: Every day | ORAL | 0 refills | Status: DC

## 2017-08-03 NOTE — Telephone Encounter (Signed)
Notified pt w/MD response. Pt states she is hurting from head to toe, when she wakes up in the morning sometimes she is a little dis-oriented. She feel like she is just foggy headed. She states Dr. Nickola MajorHawkes office had to cancel her appt that was in April. She is schedule to see her in September.Elaine Allen.Raechel Chute/lmb

## 2017-08-03 NOTE — Telephone Encounter (Signed)
Patient reports she is having another flare of her Lupus and she is requesting a refill of prednisone. She reports the prednisone works well for her.  Rx refill request: prednisone 5 mg        Last filled: 02/19/17  LOV: 02/19/17  PCP: Okey Duprerawford  Pharmacy: verified

## 2017-08-03 NOTE — Telephone Encounter (Signed)
Left message on voice mail to call back to discuss need for refill of medication- may need appointment for refill. May speak with any nurse available.

## 2017-08-03 NOTE — Telephone Encounter (Signed)
We need to know what symptoms of possible lupus flare she is having? Did she keep rheumatology visit in April with Dr. Nickola MajorHawkes?

## 2017-08-04 NOTE — Telephone Encounter (Signed)
Notified pt MD sent refill to [facility]../lmb 

## 2017-08-10 ENCOUNTER — Ambulatory Visit (INDEPENDENT_AMBULATORY_CARE_PROVIDER_SITE_OTHER): Payer: Medicare HMO | Admitting: *Deleted

## 2017-08-10 DIAGNOSIS — Z111 Encounter for screening for respiratory tuberculosis: Secondary | ICD-10-CM | POA: Diagnosis not present

## 2017-08-12 LAB — TB SKIN TEST
Induration: 0 mm
TB Skin Test: NEGATIVE

## 2017-09-13 ENCOUNTER — Other Ambulatory Visit (INDEPENDENT_AMBULATORY_CARE_PROVIDER_SITE_OTHER): Payer: Medicare HMO

## 2017-09-13 ENCOUNTER — Encounter: Payer: Self-pay | Admitting: Internal Medicine

## 2017-09-13 ENCOUNTER — Ambulatory Visit (INDEPENDENT_AMBULATORY_CARE_PROVIDER_SITE_OTHER): Payer: Medicare HMO | Admitting: Internal Medicine

## 2017-09-13 VITALS — BP 110/70 | HR 119 | Temp 100.0°F | Ht 65.0 in | Wt 188.0 lb

## 2017-09-13 DIAGNOSIS — R200 Anesthesia of skin: Secondary | ICD-10-CM | POA: Diagnosis not present

## 2017-09-13 DIAGNOSIS — R05 Cough: Secondary | ICD-10-CM

## 2017-09-13 DIAGNOSIS — M329 Systemic lupus erythematosus, unspecified: Secondary | ICD-10-CM

## 2017-09-13 DIAGNOSIS — R059 Cough, unspecified: Secondary | ICD-10-CM

## 2017-09-13 LAB — COMPREHENSIVE METABOLIC PANEL
ALT: 22 U/L (ref 0–35)
AST: 28 U/L (ref 0–37)
Albumin: 3.7 g/dL (ref 3.5–5.2)
Alkaline Phosphatase: 104 U/L (ref 39–117)
BUN: 13 mg/dL (ref 6–23)
CO2: 30 mEq/L (ref 19–32)
Calcium: 9.3 mg/dL (ref 8.4–10.5)
Chloride: 101 mEq/L (ref 96–112)
Creatinine, Ser: 1.02 mg/dL (ref 0.40–1.20)
GFR: 70.16 mL/min (ref >60.00)
Glucose, Bld: 114 mg/dL — ABNORMAL HIGH (ref 70–99)
Potassium: 3.4 mEq/L — ABNORMAL LOW (ref 3.5–5.1)
Sodium: 137 mEq/L (ref 135–145)
Total Bilirubin: 0.9 mg/dL (ref 0.2–1.2)
Total Protein: 7.9 g/dL (ref 6.0–8.3)

## 2017-09-13 LAB — VITAMIN B12: Vitamin B-12: 335 pg/mL (ref 211–911)

## 2017-09-13 LAB — CBC
HCT: 38 % (ref 36.0–46.0)
Hemoglobin: 12.9 g/dL (ref 12.0–15.0)
MCHC: 33.9 g/dL (ref 30.0–36.0)
MCV: 88.3 fl (ref 78.0–100.0)
Platelets: 188 10*3/uL (ref 150.0–400.0)
RBC: 4.31 Mil/uL (ref 3.87–5.11)
RDW: 12.7 % (ref 11.5–15.5)
WBC: 3.8 10*3/uL — ABNORMAL LOW (ref 4.0–10.5)

## 2017-09-13 LAB — TSH: TSH: 1.52 u[IU]/mL (ref 0.35–4.50)

## 2017-09-13 LAB — HEMOGLOBIN A1C: Hgb A1c MFr Bld: 6.4 % (ref 4.6–6.5)

## 2017-09-13 LAB — VITAMIN D 25 HYDROXY (VIT D DEFICIENCY, FRACTURES): VITD: 21.52 ng/mL — ABNORMAL LOW (ref 30.00–100.00)

## 2017-09-13 MED ORDER — DOXYCYCLINE HYCLATE 100 MG PO TABS: 100.0000 mg | ORAL_TABLET | Freq: Two times a day (BID) | ORAL | 0 refills | Status: DC

## 2017-09-13 MED ORDER — PREDNISONE 20 MG PO TABS: 40.0000 mg | ORAL_TABLET | Freq: Every day | ORAL | 0 refills | Status: DC

## 2017-09-13 NOTE — Patient Instructions (Signed)
We will check the blood work today and call you back about the results.   We have sent in prednisone to take 2 pills daily for 1 week.  We have sent in doxycycline to take 1 pill twice a day for 1 week.

## 2017-09-13 NOTE — Progress Notes (Signed)
   Subjective:    Patient ID: Elaine Allen, female    DOB: [birth year], 62 y.o.   MRN: [id number]  HPI The patient is a 62 YO female coming in for several concerns including cough (going on for about 3 weeks, coughing all the time, no sputum, denies SOB, has been using cough drips, candy or mints, has some pain with coughing so much, has vomited from coughing so much, does have some mild SOB, denies fevers or chills, denies nose drainage or dripping), leg pain (some cramping in muscles of her legs, worse at night time, started in the last 3 weeks or so, does feel some like a lupus flare with muscle pain all over, denies rash or leg swelling), and tingling in feet (started in the last 3 weeks, denies change in diet or exercise, denies fevers or chills, no weigh change, no prior history of diabetes).   Review of Systems  Constitutional: Positive for activity change and appetite change. Negative for fatigue, fever and unexpected weight change.  HENT: Negative for congestion, ear discharge, ear pain, sinus pain, sneezing, sore throat, tinnitus, trouble swallowing and voice change.   Eyes: Negative.   Respiratory: Positive for cough and shortness of breath. Negative for chest tightness and wheezing.   Cardiovascular: Negative.  Negative for chest pain, palpitations and leg swelling.  Gastrointestinal: Negative.  Negative for abdominal distention, abdominal pain, constipation, diarrhea, nausea and vomiting.  Musculoskeletal: Positive for arthralgias and myalgias. Negative for gait problem, joint swelling, neck pain and neck stiffness.  Skin: Negative.   Neurological: Positive for numbness.  Psychiatric/Behavioral: Negative.       Objective:   Physical Exam  Constitutional: She is oriented to person, place, and time. She appears well-developed and well-nourished.  HENT:  Head: Normocephalic and atraumatic.  Oropharynx with mild redness, no drainage appreciated. TMs normal.   Eyes: EOM are normal.    Neck: Normal range of motion.  Cardiovascular: Normal rate and regular rhythm.  Pulmonary/Chest: Effort normal. No respiratory distress. She has no wheezes. She has no rales.  Some rhonchi in the bases bilaterally which do not clear with coughing  Abdominal: Soft. Bowel sounds are normal. She exhibits no distension. There is no tenderness. There is no rebound.  Musculoskeletal: She exhibits no edema.  Neurological: She is alert and oriented to person, place, and time. Coordination normal.  Skin: Skin is warm and dry.  Psychiatric: She has a normal mood and affect.   Vitals:   09/13/17 1409  BP: 110/70  Pulse: (!) 119  Temp: 100 F (37.8 C)  TempSrc: Oral  SpO2: 98%  Weight: 188 lb (85.3 kg)  Height: 5\' 5"  (1.651 m)      Assessment & Plan:

## 2017-09-14 DIAGNOSIS — R200 Anesthesia of skin: Secondary | ICD-10-CM | POA: Insufficient documentation

## 2017-09-14 NOTE — Assessment & Plan Note (Signed)
Suspect the muscle pain is a flare and rx for prednisone 1 week course. She is getting in with rheumatology later this month.

## 2017-09-14 NOTE — Assessment & Plan Note (Signed)
Checking thyroid, HgA1c, B12, vitamin D for cause of the numbness. This could be related to possible lupus flare with the muscle changes as well. Prior pre-diabetes and checking for progression to diabetes.

## 2017-09-14 NOTE — Assessment & Plan Note (Signed)
Rx for prednisone and doxycycline to help with likely bronchitis.

## 2017-09-26 DIAGNOSIS — M79662 Pain in left lower leg: Secondary | ICD-10-CM | POA: Diagnosis not present

## 2017-09-26 DIAGNOSIS — M79661 Pain in right lower leg: Secondary | ICD-10-CM | POA: Diagnosis not present

## 2017-09-26 DIAGNOSIS — R2243 Localized swelling, mass and lump, lower limb, bilateral: Secondary | ICD-10-CM | POA: Diagnosis not present

## 2017-10-05 ENCOUNTER — Other Ambulatory Visit: Payer: Self-pay | Admitting: Internal Medicine

## 2017-10-05 ENCOUNTER — Encounter: Payer: Self-pay | Admitting: Internal Medicine

## 2017-10-05 ENCOUNTER — Ambulatory Visit (INDEPENDENT_AMBULATORY_CARE_PROVIDER_SITE_OTHER)
Admission: RE | Admit: 2017-10-05 | Discharge: 2017-10-05 | Disposition: A | Discharge status: Home / Self Care | Payer: Medicare HMO | Source: Ambulatory Visit | Attending: Internal Medicine | Admitting: Internal Medicine

## 2017-10-05 ENCOUNTER — Ambulatory Visit (INDEPENDENT_AMBULATORY_CARE_PROVIDER_SITE_OTHER): Payer: Medicare HMO | Admitting: Internal Medicine

## 2017-10-05 VITALS — BP 110/70 | HR 114 | Temp 100.4°F | Ht 65.0 in | Wt 181.0 lb

## 2017-10-05 DIAGNOSIS — R059 Cough, unspecified: Secondary | ICD-10-CM

## 2017-10-05 DIAGNOSIS — R05 Cough: Secondary | ICD-10-CM

## 2017-10-05 DIAGNOSIS — R509 Fever, unspecified: Secondary | ICD-10-CM

## 2017-10-05 LAB — POC INFLUENZA A&B (BINAX/QUICKVUE)
Influenza A, POC: NEGATIVE
Influenza B, POC: NEGATIVE

## 2017-10-05 MED ORDER — PREDNISONE 20 MG PO TABS: ORAL_TABLET | ORAL | 0 refills | Status: DC

## 2017-10-05 NOTE — Patient Instructions (Signed)
Flu testing is negative today. We are checking a chest x-ray to see if you have any pneumonia.   If we see any problems we will call in an antibiotic.   If we do not see any problems we will call in a course of prednisone.

## 2017-10-05 NOTE — Progress Notes (Addendum)
   Subjective:    Patient ID: Elaine Allen, female    DOB: [birth year], 62 y.o.   MRN: [id number]  HPI The patient is a 62 YO female coming in for cough which is back. She was seen about 1 month ago for cough and given doxycycline and prednisone as the cough had been present for about 1 month at that time. She did have improvement and the cough almost dissapated which on the medications. She then had about 3-4 days with minimal cough. Then it started coming back again and for the last 2 weeks has been moderate to severe again. She has used otc medications without relief. She is not coughing up much. Denies sinus congestion or drainage. Does have fevers and chills the last several days. Some muscle aches as well. She denies ear pain, headaches, sore throat. She does have lupus and is not thinking that this is like a normal lupus flare. Denies being exposed to sick contacts.   Review of Systems  Constitutional: Positive for activity change, appetite change, chills and fever. Negative for fatigue and unexpected weight change.  HENT: Positive for congestion. Negative for ear discharge, ear pain, postnasal drip, rhinorrhea, sinus pressure, sinus pain, sneezing, sore throat, tinnitus, trouble swallowing and voice change.   Eyes: Negative.   Respiratory: Positive for cough and shortness of breath. Negative for chest tightness and wheezing.   Cardiovascular: Negative.   Gastrointestinal: Negative.   Musculoskeletal: Positive for myalgias.  Neurological: Negative.       Objective:   Physical Exam  Constitutional: She is oriented to person, place, and time. She appears well-developed and well-nourished.  HENT:  Head: Normocephalic and atraumatic.  Minimal erythema in the oropharynx, TMs normal bilaterally  Eyes: EOM are normal.  Neck: Normal range of motion.  Cardiovascular: Normal rate and regular rhythm.  Pulmonary/Chest: Effort normal and breath sounds normal. No respiratory distress. She has no  wheezes. She has no rales.  Abdominal: Soft. Bowel sounds are normal. She exhibits no distension. There is no tenderness. There is no rebound.  Musculoskeletal: She exhibits no edema.  Neurological: She is alert and oriented to person, place, and time. Coordination normal.  Skin: Skin is warm and dry.  Psychiatric: She has a normal mood and affect.   Vitals:   10/05/17 1110  BP: 110/70  Pulse: (!) 114  Temp: (!) 100.4 F (38 C)  TempSrc: Oral  SpO2: 96%  Weight: 181 lb (82.1 kg)  Height: 5\' 5"  (1.651 m)      Assessment & Plan:  Visit time 25 minutes: greater than 50% of that time was spent in face to face counseling and coordination of care with the patient: counseled about exact time course and resolution of symptoms on medications and course since resumption of symptoms as well as current symptoms and medications

## 2017-10-08 NOTE — Assessment & Plan Note (Signed)
Given persistence after antibiotics and prednisone needs imaging. Ordered CXR and POC flu testing done in the office which is negative. Could be related to other viral illness given the fever in the office today. More rarely could be variant lupus flare. If CXR clear rx for prednisone. If CXR with findings treat appropriately.

## 2017-11-22 ENCOUNTER — Ambulatory Visit (INDEPENDENT_AMBULATORY_CARE_PROVIDER_SITE_OTHER): Payer: Medicare HMO

## 2017-11-22 ENCOUNTER — Ambulatory Visit (HOSPITAL_COMMUNITY)
Admission: EM | Admit: 2017-11-22 | Discharge: 2017-11-22 | Disposition: A | Discharge status: Home / Self Care | Payer: Medicare HMO | Attending: Emergency Medicine | Admitting: Emergency Medicine

## 2017-11-22 ENCOUNTER — Encounter (HOSPITAL_COMMUNITY): Payer: Self-pay | Admitting: Emergency Medicine

## 2017-11-22 DIAGNOSIS — M25562 Pain in left knee: Secondary | ICD-10-CM | POA: Diagnosis not present

## 2017-11-22 DIAGNOSIS — Z471 Aftercare following joint replacement surgery: Secondary | ICD-10-CM | POA: Diagnosis not present

## 2017-11-22 DIAGNOSIS — Z96652 Presence of left artificial knee joint: Secondary | ICD-10-CM | POA: Diagnosis not present

## 2017-11-22 MED ORDER — MELOXICAM 7.5 MG PO TABS: 7.5000 mg | ORAL_TABLET | Freq: Every day | ORAL | 0 refills | Status: DC

## 2017-11-22 NOTE — ED Provider Notes (Signed)
[facility]    CSN: [id number] Arrival date & time: 11/22/17  [id number]     History   Chief Complaint Chief Complaint  Patient presents with  . Knee Pain    HPI Elaine Allen is a 62 y.o. female.   Elaine Allen presents with complaints of worsening of left knee pain for the past few months. Improves with activity and then "freezes up" with rest. She is able to cycle at the gym which is helpful, but after this once in her vehicle for example has increased pain. Pain worse at night while trying to sleep, aches. Pain 10/10 at night. Currently pain 7/10. Feels some upper and lower leg tingling. No numbness or tingling to foot. No injury to the knee. Notices some swelling at night as well. She has tried prednisone which she uses for her lupus flares, last course two weeks ago, which did not help. Has tried ibuprofen which hasn't helped either. History of TKA in 2010 with Dr. Montez Moritaarter. Hx of anemia, lupus, dysphonia, stroke.     ROS per HPI.      Past Medical History:  Diagnosis Date  . Chronic anemia   . Kidney stones   . Lupus (HCC)   . Retinal detachment   . Spasmodic dysphonia   . Stroke [facility](HCC) 2015    Patient Active Problem List   Diagnosis Date Noted  . Numbness in feet 09/14/2017  . Elevated LFTs 02/24/2017  . Pre-diabetes 02/19/2017  . Fatigue 02/19/2017  . Left hip pain 10/29/2016  . Numbness and tingling in left arm 05/05/2016  . Cough 12/25/2015  . Wheezing 12/25/2015  . Abdominal pain 12/25/2015  . Back pain 10/01/2015  . Insomnia 03/01/2015  . Family hx of colon cancer 11/12/2014  . Essential hypertension, benign 05/15/2013  . Other and unspecified hyperlipidemia 05/15/2013  . Vertebral artery dissection (HCC) 05/14/2013  . Hx of completed stroke 05/14/2013  . Chronic anemia   . OSTEOARTHRITIS, KNEE, RIGHT, SEVERE 12/19/2007  . Primary hyperparathyroidism (HCC) 11/24/2007  . OSTEOPENIA 11/24/2007  . Lupus (systemic lupus erythematosus) (HCC) 01/05/2002     Past Surgical History:  Procedure Laterality Date  . ABDOMINAL HYSTERECTOMY    . KNEE SURGERY Left   . THYROID SURGERY      OB History   None      Home Medications    Prior to Admission medications   Medication Sig Start Date End Date Taking? Authorizing Provider  meloxicam (MOBIC) 7.5 MG tablet Take 1 tablet (7.5 mg total) by mouth daily. 11/22/17   Georgetta HaberBurky, Tel Hevia B, NP  predniSONE (DELTASONE) 20 MG tablet Take 2 pills daily for 1 week, then 1 pill daily for 5 days then stop. Patient not taking: Reported on 11/22/2017 10/05/17   Myrlene Brokerrawford, Elizabeth A, MD    Family History Family History  Problem Relation Age of Onset  . Cancer Mother        liver  . Diabetes Mother   . Cancer Father        prostate,METS, stomach     Social History Social History   Tobacco Use  . Smoking status: Never Smoker  . Smokeless tobacco: Never Used  Substance Use Topics  . Alcohol use: No  . Drug use: No     Allergies   Azithromycin and Hydroxychloroquine sulfate   Review of Systems Review of Systems   Physical Exam Triage Vital Signs ED Triage Vitals [11/22/17 1042]  Enc Vitals Group     BP (!) 169/94  Pulse Rate 76     Resp 18     Temp 98.7 F (37.1 C)     Temp Source Oral     SpO2 99 %     Weight      Height      Head Circumference      Peak Flow      Pain Score 8     Pain Loc      Pain Edu?      Excl. in GC?    No data found.  Updated Vital Signs BP (!) 169/94 (BP Location: Left Arm)   Pulse 76   Temp 98.7 F (37.1 C) (Oral)   Resp 18   SpO2 99%    Physical Exam  Constitutional: She is oriented to person, place, and time. She appears well-developed and well-nourished. No distress.  Cardiovascular: Normal rate, regular rhythm and normal heart sounds.  Pulmonary/Chest: Effort normal and breath sounds normal.  Musculoskeletal:       Left knee: She exhibits normal range of motion, no swelling, no effusion, no ecchymosis, no deformity, no  laceration, no erythema, normal alignment, no LCL laxity, normal patellar mobility, no bony tenderness, normal meniscus and no MCL laxity. Tenderness found. Patellar tendon tenderness noted.  Left knee with midline scar; no obvious laxity to knee; mild pain with flexion, no pain with extension  Neurological: She is alert and oriented to person, place, and time.  Skin: Skin is warm and dry.     UC Treatments / Results  Labs (all labs ordered are listed, but only abnormal results are displayed) Labs Reviewed - No data to display  EKG None  Radiology Dg Knee Complete 4 Views Left  Result Date: 11/22/2017 CLINICAL DATA:  Left knee pain and stiffness for 3 months. History of total knee arthroplasty in 2010. EXAM: LEFT KNEE - COMPLETE 4+ VIEW COMPARISON:  Preoperative radiographs 10/07/2008. FINDINGS: Status post interval left total knee arthroplasty. There is no evidence of hardware loosening, acute fracture, dislocation or bone destruction. There is no significant joint effusion. The bones appear mildly demineralized. IMPRESSION: Interval left total knee arthroplasty without demonstrated complication. No acute osseous findings. Electronically Signed   By: Carey Bullocks M.D.   On: 11/22/2017 11:38    Procedures Procedures (including critical care time)  Medications Ordered in UC Medications - No data to display  Initial Impression / Assessment and Plan / UC Course  I have reviewed the triage vital signs and the nursing notes.  Pertinent labs & imaging results that were available during my care of the patient were reviewed by me and considered in my medical decision making (see chart for details).     Replacement WNL on xray today. Likely arthritic pain. Ice, elevation, nsaids, activity as tolerated. Follow up with ortho recommended. Patient verbalized understanding and agreeable to plan.   Final Clinical Impressions(s) / UC Diagnoses   Final diagnoses:  Left knee pain, unspecified  chronicity     Discharge Instructions     Your xray is reassuring today- no obvious issue with your replacement.  We will start an antiinflammatory to try to help with likely arthritic pain.  Take daily, with food, don't take additional ibuprofen.  Please follow up with orthopedics for further evaluation.  Continue with activity as tolerated as this can be helpful.  Ice and elevation after activity.     ED Prescriptions    Medication Sig Dispense Auth. Provider   meloxicam (MOBIC) 7.5 MG tablet Take 1 tablet (  7.5 mg total) by mouth daily. 20 tablet Georgetta Haber, NP     Controlled Substance Prescriptions [place] Controlled Substance Registry consulted? Not Applicable   Georgetta Haber, NP 11/22/17 1200

## 2017-11-22 NOTE — ED Triage Notes (Signed)
Pt sts left knee pain and swelling with hx of knee replacement in same leg; pt sts increased pain and swelling x 3 weeks

## 2017-11-22 NOTE — Discharge Instructions (Signed)
Your xray is reassuring today- no obvious issue with your replacement.  We will start an antiinflammatory to try to help with likely arthritic pain.  Take daily, with food, don't take additional ibuprofen.  Please follow up with orthopedics for further evaluation.  Continue with activity as tolerated as this can be helpful.  Ice and elevation after activity.

## 2018-02-09 ENCOUNTER — Ambulatory Visit (INDEPENDENT_AMBULATORY_CARE_PROVIDER_SITE_OTHER): Payer: Medicare HMO | Admitting: Family

## 2018-02-09 ENCOUNTER — Encounter: Payer: Self-pay | Admitting: Family

## 2018-02-09 ENCOUNTER — Ambulatory Visit (INDEPENDENT_AMBULATORY_CARE_PROVIDER_SITE_OTHER)
Admission: RE | Admit: 2018-02-09 | Discharge: 2018-02-09 | Disposition: A | Discharge status: Home / Self Care | Payer: Medicare HMO | Source: Ambulatory Visit | Attending: Family | Admitting: Family

## 2018-02-09 ENCOUNTER — Other Ambulatory Visit (INDEPENDENT_AMBULATORY_CARE_PROVIDER_SITE_OTHER): Payer: Medicare HMO

## 2018-02-09 VITALS — BP 130/82 | HR 110 | Temp 98.3°F | Ht 65.0 in | Wt 172.0 lb

## 2018-02-09 DIAGNOSIS — R05 Cough: Secondary | ICD-10-CM

## 2018-02-09 DIAGNOSIS — R058 Other specified cough: Secondary | ICD-10-CM

## 2018-02-09 DIAGNOSIS — Z23 Encounter for immunization: Secondary | ICD-10-CM

## 2018-02-09 DIAGNOSIS — R21 Rash and other nonspecific skin eruption: Secondary | ICD-10-CM

## 2018-02-09 DIAGNOSIS — R079 Chest pain, unspecified: Secondary | ICD-10-CM | POA: Diagnosis not present

## 2018-02-09 LAB — COMPREHENSIVE METABOLIC PANEL
ALT: 12 U/L (ref 0–35)
AST: 23 U/L (ref 0–37)
Albumin: 4 g/dL (ref 3.5–5.2)
Alkaline Phosphatase: 101 U/L (ref 39–117)
BUN: 18 mg/dL (ref 6–23)
CO2: 28 mEq/L (ref 19–32)
Calcium: 9.7 mg/dL (ref 8.4–10.5)
Chloride: 106 mEq/L (ref 96–112)
Creatinine, Ser: 0.92 mg/dL (ref 0.40–1.20)
GFR: 74.26 mL/min (ref >60.00)
Glucose, Bld: 92 mg/dL (ref 70–99)
Potassium: 3.4 mEq/L — ABNORMAL LOW (ref 3.5–5.1)
Sodium: 142 mEq/L (ref 135–145)
Total Bilirubin: 0.8 mg/dL (ref 0.2–1.2)
Total Protein: 7.9 g/dL (ref 6.0–8.3)

## 2018-02-09 LAB — CBC WITH DIFFERENTIAL/PLATELET
Basophils Absolute: 0 10*3/uL (ref 0.0–0.1)
Basophils Relative: 0.6 % (ref 0.0–3.0)
Eosinophils Absolute: 0.1 10*3/uL (ref 0.0–0.7)
Eosinophils Relative: 3.5 % (ref 0.0–5.0)
HCT: 39.2 % (ref 36.0–46.0)
Hemoglobin: 12.9 g/dL (ref 12.0–15.0)
Lymphocytes Relative: 38.9 % (ref 12.0–46.0)
Lymphs Abs: 1.4 10*3/uL (ref 0.7–4.0)
MCHC: 32.8 g/dL (ref 30.0–36.0)
MCV: 87.6 fl (ref 78.0–100.0)
Monocytes Absolute: 0.4 10*3/uL (ref 0.1–1.0)
Monocytes Relative: 12.7 % — ABNORMAL HIGH (ref 3.0–12.0)
Neutro Abs: 1.6 10*3/uL (ref 1.4–7.7)
Neutrophils Relative %: 44.3 % (ref 43.0–77.0)
Platelets: 208 10*3/uL (ref 150.0–400.0)
RBC: 4.48 Mil/uL (ref 3.87–5.11)
RDW: 14.3 % (ref 11.5–15.5)
WBC: 3.5 10*3/uL — ABNORMAL LOW (ref 4.0–10.5)

## 2018-02-09 MED ORDER — PREDNISONE 20 MG PO TABS: ORAL_TABLET | ORAL | 0 refills | Status: DC

## 2018-02-09 NOTE — Progress Notes (Signed)
Elaine Allen is a 63 y.o. female with the following history as recorded in Epic[employer]:  Patient Active Problem List   Diagnosis Date Noted  . Numbness in feet 09/14/2017  . Elevated LFTs 02/24/2017  . Pre-diabetes 02/19/2017  . Fatigue 02/19/2017  . Left hip pain 10/29/2016  . Numbness and tingling in left arm 05/05/2016  . Cough 12/25/2015  . Wheezing 12/25/2015  . Abdominal pain 12/25/2015  . Back pain 10/01/2015  . Insomnia 03/01/2015  . Family hx of colon cancer 11/12/2014  . Essential hypertension, benign 05/15/2013  . Other and unspecified hyperlipidemia 05/15/2013  . Vertebral artery dissection ([place]) 05/14/2013  . Hx of completed stroke 05/14/2013  . Chronic anemia   . OSTEOARTHRITIS, KNEE, RIGHT, SEVERE 12/19/2007  . Primary hyperparathyroidism ([place]) 11/24/2007  . OSTEOPENIA 11/24/2007  . Lupus (systemic lupus erythematosus) ([place]) 01/05/2002    Current Outpatient Medications  Medication Sig Dispense Refill  . meloxicam (MOBIC) 7.5 MG tablet Take 1 tablet (7.5 mg total) by mouth daily. 20 tablet 0  . predniSONE (DELTASONE) 20 MG tablet Take 2 pills daily for 1 week, then 1 pill daily for 5 days then stop. 20 tablet 0   No current facility-administered medications for this visit.     Allergies: Azithromycin and Hydroxychloroquine sulfate  Past Medical History:  Diagnosis Date  . Chronic anemia   . Kidney stones   . Lupus ([place])   . Retinal detachment   . Spasmodic dysphonia   . Stroke [facility]) 2015    Past Surgical History:  Procedure Laterality Date  . ABDOMINAL HYSTERECTOMY    . KNEE SURGERY Left   . THYROID SURGERY      Family History  Problem Relation Age of Onset  . Cancer Mother        liver  . Diabetes Mother   . Cancer Father        prostate,METS, stomach     Social History   Tobacco Use  . Smoking status: Never Smoker  . Smokeless tobacco: Never Used  Substance Use Topics  . Alcohol use: No    Subjective:  Patient presents with concerns  for rash on arms/ legs/ buttocks x 1 week; denies any new soaps, foods, detergents or medications. Has moved into a new apartment recently- ? Environmental changes; feels like symptoms are improved at night; has not taken any type of anti-histamine; stress level has been very high- recent move; notes that rash not very visible at time of office visit; also mentions persisting dry cough; Does have history of lupus and thinks cough is related- uses prednisone when symptoms flare; in general, does not experience skin involvement with her lupus;     Objective:  Vitals:   02/09/18 1437  BP: 130/82  Pulse: (!) 110  Temp: 98.3 F (36.8 C)  TempSrc: Oral  SpO2: 98%  Weight: 172 lb (78 kg)  Height: '5\' 5"'$  (1.651 m)    General: Well developed, well nourished, in no acute distress  Skin : Warm and dry.  Head: Normocephalic and atraumatic  Eyes: Sclera and conjunctiva clear; pupils round and reactive to light; extraocular movements intact  Ears: External normal; canals clear; tympanic membranes normal  Oropharynx: Pink, supple. No suspicious lesions  Neck: Supple without thyromegaly, adenopathy  Lungs: Respirations unlabored; clear to auscultation bilaterally without wheeze, rales, rhonchi  CVS exam: normal rate and regular rhythm.  Abdomen: Soft; nontender; nondistended; normoactive bowel sounds; no masses or hepatosplenomegaly  Musculoskeletal: No deformities; no active joint inflammation  Extremities: No edema, cyanosis, clubbing  Vessels: Symmetric bilaterally  Neurologic: Alert and oriented; speech intact; face symmetrical; moves all extremities well; CNII-XII intact without focal deficit   Assessment:  1. Rash   2. Cough present for greater than 3 weeks   3. Need for influenza vaccination     Plan:   1. & 2. ? Lupus type reaction; will update CXR and treat with oral prednisone; patient is encouraged to schedule a follow-up with her rheumatologist; she agrees; 3. Flu shot given;   No  follow-ups on file.  Orders Placed This Encounter  Procedures  . DG Chest 2 View    Standing Status:   Future    Number of Occurrences:   1    Standing Expiration Date:   04/10/2019    Order Specific Question:   Reason for Exam (SYMPTOM  OR DIAGNOSIS REQUIRED)    Answer:   cough persisting x 4 weeks    Order Specific Question:   Preferred imaging location?    Answer:   [facility]    Order Specific Question:   Radiology Contrast Protocol - do NOT remove file path    Answer:   [email or web address]  . Flu Vaccine QUAD 36+ mos IM  . CBC w/Diff    Standing Status:   Future    Number of Occurrences:   1    Standing Expiration Date:   02/09/2019  . Comp Met (CMET)    Standing Status:   Future    Number of Occurrences:   1    Standing Expiration Date:   02/09/2019    Requested Prescriptions   Signed Prescriptions Disp Refills  . predniSONE (DELTASONE) 20 MG tablet 20 tablet 0    Sig: Take 2 pills daily for 1 week, then 1 pill daily for 5 days then stop.

## 2018-03-15 ENCOUNTER — Emergency Department (HOSPITAL_COMMUNITY): Payer: Medicare HMO

## 2018-03-15 ENCOUNTER — Encounter (HOSPITAL_COMMUNITY): Payer: Self-pay | Admitting: Emergency Medicine

## 2018-03-15 ENCOUNTER — Emergency Department (HOSPITAL_COMMUNITY)
Admission: EM | Admit: 2018-03-15 | Discharge: 2018-03-15 | Disposition: A | Discharge status: Home / Self Care | Payer: Medicare HMO | Attending: Emergency Medicine | Admitting: Emergency Medicine

## 2018-03-15 DIAGNOSIS — Y9389 Activity, other specified: Secondary | ICD-10-CM | POA: Diagnosis not present

## 2018-03-15 DIAGNOSIS — I10 Essential (primary) hypertension: Secondary | ICD-10-CM | POA: Diagnosis not present

## 2018-03-15 DIAGNOSIS — S80919A Unspecified superficial injury of unspecified knee, initial encounter: Secondary | ICD-10-CM | POA: Diagnosis not present

## 2018-03-15 DIAGNOSIS — Z8673 Personal history of transient ischemic attack (TIA), and cerebral infarction without residual deficits: Secondary | ICD-10-CM | POA: Insufficient documentation

## 2018-03-15 DIAGNOSIS — S8392XA Sprain of unspecified site of left knee, initial encounter: Secondary | ICD-10-CM | POA: Insufficient documentation

## 2018-03-15 DIAGNOSIS — Y92410 Unspecified street and highway as the place of occurrence of the external cause: Secondary | ICD-10-CM | POA: Diagnosis not present

## 2018-03-15 DIAGNOSIS — Z79899 Other long term (current) drug therapy: Secondary | ICD-10-CM | POA: Insufficient documentation

## 2018-03-15 DIAGNOSIS — H9312 Tinnitus, left ear: Secondary | ICD-10-CM | POA: Diagnosis not present

## 2018-03-15 DIAGNOSIS — Y998 Other external cause status: Secondary | ICD-10-CM | POA: Insufficient documentation

## 2018-03-15 DIAGNOSIS — S8992XA Unspecified injury of left lower leg, initial encounter: Secondary | ICD-10-CM | POA: Diagnosis not present

## 2018-03-15 DIAGNOSIS — M25562 Pain in left knee: Secondary | ICD-10-CM | POA: Diagnosis not present

## 2018-03-15 HISTORY — DX: Essential (primary) hypertension: I10

## 2018-03-15 MED ORDER — METHOCARBAMOL 500 MG PO TABS: 500.0000 mg | ORAL_TABLET | Freq: Three times a day (TID) | ORAL | 0 refills | Status: DC | PRN

## 2018-03-15 NOTE — ED Notes (Signed)
RN attempted to introduce self to pt, she appeared slightly agitated.  RN explained that we needed to continue her care in a progressive bed however pt stated "I'm leaving"  RN attempted to desclate pt however she stated she was leaving.  RN advised pt, who went to bedside.  Pt left w/o discharge vitals and paperwork.
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
## 2018-03-23 ENCOUNTER — Ambulatory Visit: Payer: Medicare HMO | Admitting: Internal Medicine

## 2018-04-05 ENCOUNTER — Ambulatory Visit (INDEPENDENT_AMBULATORY_CARE_PROVIDER_SITE_OTHER): Payer: Medicare HMO | Admitting: Internal Medicine

## 2018-04-05 ENCOUNTER — Encounter: Payer: Self-pay | Admitting: Internal Medicine

## 2018-04-05 DIAGNOSIS — F5101 Primary insomnia: Secondary | ICD-10-CM | POA: Diagnosis not present

## 2018-04-05 DIAGNOSIS — M329 Systemic lupus erythematosus, unspecified: Secondary | ICD-10-CM

## 2018-04-05 DIAGNOSIS — M25562 Pain in left knee: Secondary | ICD-10-CM | POA: Diagnosis not present

## 2018-04-05 MED ORDER — MELOXICAM 7.5 MG PO TABS: 7.5000 mg | ORAL_TABLET | Freq: Every day | ORAL | 0 refills | Status: DC

## 2018-04-05 MED ORDER — PREDNISONE 20 MG PO TABS: ORAL_TABLET | ORAL | 0 refills | Status: DC

## 2018-04-05 MED ORDER — ESZOPICLONE 2 MG PO TABS: 2.0000 mg | ORAL_TABLET | Freq: Every evening | ORAL | 1 refills | Status: DC | PRN
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
## 2018-04-06 DIAGNOSIS — M25562 Pain in left knee: Secondary | ICD-10-CM | POA: Insufficient documentation

## 2018-04-06 NOTE — Assessment & Plan Note (Signed)
Rx for meloxicam to help with pain. Rx for prednisone as this sounds compatible with lupus flare.
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
## 2018-04-07 ENCOUNTER — Telehealth: Payer: Self-pay

## 2018-04-07 NOTE — Telephone Encounter (Signed)
PA started on CoverMyMeds KEY: [id number] PA approved 01/05/2018-01/05/2019

## 2018-04-08 ENCOUNTER — Encounter: Payer: Self-pay | Admitting: Family Medicine

## 2018-04-08 ENCOUNTER — Ambulatory Visit (INDEPENDENT_AMBULATORY_CARE_PROVIDER_SITE_OTHER): Payer: Medicare HMO | Admitting: Family Medicine

## 2018-04-08 ENCOUNTER — Ambulatory Visit: Payer: Medicare HMO | Admitting: Family Medicine

## 2018-04-08 ENCOUNTER — Other Ambulatory Visit: Payer: Self-pay

## 2018-04-08 VITALS — BP 152/88 | HR 107 | Resp 16 | Ht 64.0 in | Wt 181.0 lb

## 2018-04-08 DIAGNOSIS — M7062 Trochanteric bursitis, left hip: Secondary | ICD-10-CM | POA: Insufficient documentation

## 2018-04-08 DIAGNOSIS — M25562 Pain in left knee: Secondary | ICD-10-CM | POA: Diagnosis not present

## 2018-04-08 DIAGNOSIS — M5432 Sciatica, left side: Secondary | ICD-10-CM | POA: Diagnosis not present

## 2018-04-08 MED ORDER — GABAPENTIN 100 MG PO CAPS: 100.0000 mg | ORAL_CAPSULE | Freq: Three times a day (TID) | ORAL | 3 refills | Status: DC

## 2018-04-08 NOTE — Assessment & Plan Note (Addendum)
Possible to be related to leg length discrepancy. Left leg is longer by about 1 cm than right. Mechanically the left works well and has good range of motion.  -We will try to fix her leg length difference in order to help with the pain on the left side. -Counseled on home exercise therapy and supportive care. -If no improvement with the adjustments could consider CT scan to evaluate the knee

## 2018-04-08 NOTE — Assessment & Plan Note (Signed)
It seems that the pain in the thigh and the buttock is more associated with sciatica.  This could be related to the abnormal gait due to the leg length difference.  The lateral hip pain seems more associated with trochanteric bursitis. -Initiate gabapentin. -Counseled on obtaining Hapad heel lifts. -If no improvement will consider making custom orthotics. -Could consider an injection to the trochanteric bursa.
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
## 2018-04-08 NOTE — Patient Instructions (Signed)
Good to see you  Please try the gabapentin. Please start with one pill at night and then increase to two or three times daily  Please try to get a lift to place in the right shoe  Please see me at the [place] location if you would like to try a pair of custom orthotics.  Please try the exercises  Please send me a message in MyChart if your pain continues

## 2018-04-19 ENCOUNTER — Encounter: Payer: Self-pay | Admitting: Family Medicine

## 2018-04-29 ENCOUNTER — Other Ambulatory Visit: Payer: Self-pay

## 2018-04-29 ENCOUNTER — Encounter: Payer: Self-pay | Admitting: Family Medicine

## 2018-04-29 ENCOUNTER — Ambulatory Visit (INDEPENDENT_AMBULATORY_CARE_PROVIDER_SITE_OTHER)
Admission: RE | Admit: 2018-04-29 | Discharge: 2018-04-29 | Disposition: A | Discharge status: Home / Self Care | Payer: Medicare HMO | Source: Ambulatory Visit | Attending: Family Medicine | Admitting: Family Medicine

## 2018-04-29 ENCOUNTER — Ambulatory Visit (INDEPENDENT_AMBULATORY_CARE_PROVIDER_SITE_OTHER): Payer: Medicare HMO | Admitting: Family Medicine

## 2018-04-29 ENCOUNTER — Ambulatory Visit: Payer: Self-pay

## 2018-04-29 VITALS — BP 144/86 | HR 100 | Resp 16 | Ht 64.0 in | Wt 183.0 lb

## 2018-04-29 DIAGNOSIS — M7062 Trochanteric bursitis, left hip: Secondary | ICD-10-CM

## 2018-04-29 DIAGNOSIS — M48061 Spinal stenosis, lumbar region without neurogenic claudication: Secondary | ICD-10-CM | POA: Diagnosis not present

## 2018-04-29 DIAGNOSIS — M25552 Pain in left hip: Secondary | ICD-10-CM | POA: Diagnosis not present

## 2018-04-29 NOTE — Assessment & Plan Note (Signed)
Possible the pain is associated with greater trochanteric bursitis.  Could be associated with her hip as well.  Has been getting treatment for sciatic type symptoms as well as gabapentin. -Greater trochanteric injection today. -Left hip and lumbar films. -If no improvement will need to consider the left hip joint injection.

## 2018-04-29 NOTE — Patient Instructions (Signed)
Good to see you  I will call with the xray results  Please send me a MyChart with updates on the pain. We may need to try a different injection.

## 2018-04-29 NOTE — Progress Notes (Signed)
Elaine Allen - 63 y.o. female MRN [id number]  Date of birth: 15-Apr-1955  SUBJECTIVE:  Including CC & ROS.  No chief complaint on file.   Elaine Allen is a 63 y.o. female that is presenting with acute worsening of her left hip pain.  She feels the pain over the lateral hip as well as anterior thigh.  The pain is worse with walking.  She is tried gabapentin with limited improvement.  She has some pain that extends down to the lateral knee.  Denies any mechanical symptoms.  The pain is sharp and stabbing.  Pain can be moderate to severe.    Review of Systems  Constitutional: Negative for fever.  HENT: Negative for congestion.   Respiratory: Negative for cough.   Cardiovascular: Negative for chest pain.  Gastrointestinal: Negative for abdominal pain.  Musculoskeletal: Positive for back pain and gait problem.  Skin: Negative for color change.  Neurological: Negative for weakness.  Hematological: Negative for adenopathy.    HISTORY: Past Medical, Surgical, Social, and Family History Reviewed & Updated per EMR.   Pertinent Historical Findings include:  Past Medical History:  Diagnosis Date   Chronic anemia    Hypertension    Kidney stones    Lupus (HCC)    Retinal detachment    Spasmodic dysphonia    Stroke (HCC) 2015    Past Surgical History:  Procedure Laterality Date   ABDOMINAL HYSTERECTOMY     KNEE SURGERY Left    THYROID SURGERY      Allergies  Allergen Reactions   Plaquenil [Hydroxychloroquine Sulfate] Anaphylaxis and Other (See Comments)    Blisters all over body   Azithromycin Nausea And Vomiting   Lactose Intolerance (Gi) Other (See Comments)    Upset stomach   Penicillin G Itching and Nausea Only    Did it involve swelling of the face/tongue/throat, SOB, or low BP? No No Did it involve sudden or severe rash/hives, skin peeling, or any reaction on the inside of your mouth or nose? Np No Did you need to seek medical attention at a hospital or  doctor's office? Yes Yes When did it last happen?around 2010 If all above answers are NO, may proceed with cephalosporin use.    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   Food insecurity:    Worry: Never true    Inability: Never true   Transportation needs:    Medical: No    Non-medical: No  Tobacco Use   Smoking status: Never Smoker   Smokeless tobacco: Never Used  Substance and Sexual Activity   Alcohol use: No   Drug use: No   Sexual activity: Not Currently  Lifestyle   Physical activity:    Days per week: 3 days    Minutes per session: 30 min   Stress: Not at all  Relationships   Social connections:  Talks on phone: More than three times a week    Gets together: More than three times a week    Attends religious service: More than 4 times per year    Active member of club or organization: Yes    Attends meetings of clubs or organizations: More than 4 times per year    Relationship status: Widowed   Intimate partner violence:    Fear of current or ex partner: Not on file    Emotionally abused: Not on file    Physically abused: Not on file    Forced sexual activity: Not on file  Other Topics Concern   Not on file  Social History Narrative   Not on file     PHYSICAL EXAM:  VS: BP (!) 144/86    Pulse 100    Resp 16    Ht 5\' 4"  (1.626 m)    Wt 183 lb (83 kg)    SpO2 98%    BMI 31.41 kg/m  Physical Exam Gen: NAD, alert, cooperative with exam, well-appearing ENT:  normal lips, normal nasal mucosa,  Eye: normal EOM, normal conjunctiva and lids CV:  no edema, +2 pedal pulses   Resp: no accessory muscle use, non-labored,  Skin: no rashes, no areas of induration  Neuro: normal tone, normal sensation to touch Psych:  normal insight, alert and oriented MSK:  Back/left hip:  Tenderness to palpation over the greater trochanter. Some pain with internal rotation. Normal strength resistance with hip flexion, knee flexion extension, plantarflexion and dorsiflexion. Walking with a limp. Negative straight leg raise. Neurovascular intact   Aspiration/Injection Procedure Note Anderson MaltaJill L Fleischhacker 12-24-1955  Procedure: Injection Indications: left hip pain   Procedure Details Consent: Risks of procedure as well as the alternatives and risks of each were explained to the (patient/caregiver).  Consent for procedure obtained. Time Out: Verified patient identification, verified procedure, site/side was marked, verified correct patient position, special equipment/implants available, medications/allergies/relevent history reviewed, required imaging and test results available.  Performed.  The area was cleaned with iodine and alcohol swabs.    The left greater trochanteric bursa was injected using 1 cc's of 80 mg Depo-Medrol and 4 cc's of 0.5% bupivacaine with a 22 3" needle.  Ultrasound was used. Images were obtained in trans views showing the injection.     A sterile dressing was applied.  Patient did tolerate procedure well.       ASSESSMENT & PLAN:   Greater trochanteric bursitis of left hip Possible the pain is associated with greater trochanteric bursitis.  Could be associated with her hip as well.  Has been getting treatment for sciatic type symptoms as well as gabapentin. -Greater trochanteric injection today. -Left hip and lumbar films. -If no improvement will need to consider the left hip joint injection.

## 2018-05-02 ENCOUNTER — Telehealth: Payer: Self-pay | Admitting: Family Medicine

## 2018-05-02 NOTE — Telephone Encounter (Signed)
Spoke with patient about xray results. Looks like the joint is the culprit of her pain and can try a hip joint injection.   Elaine Rude, MD [facility] 05/02/2018, 9:34 AM

## 2018-05-06 ENCOUNTER — Ambulatory Visit: Payer: Medicare HMO | Admitting: Family Medicine

## 2018-05-06 ENCOUNTER — Encounter: Payer: Self-pay | Admitting: Family Medicine

## 2018-05-06 ENCOUNTER — Ambulatory Visit: Payer: Self-pay

## 2018-05-06 ENCOUNTER — Other Ambulatory Visit: Payer: Self-pay

## 2018-05-06 VITALS — BP 170/110 | HR 88 | Resp 16

## 2018-05-06 DIAGNOSIS — M1612 Unilateral primary osteoarthritis, left hip: Secondary | ICD-10-CM

## 2018-05-06 NOTE — Progress Notes (Signed)
Elaine Allen - 63 y.o. female MRN [id number]  Date of birth: Jul 24, 1955  SUBJECTIVE:  Including CC & ROS.  No chief complaint on file.   Elaine Allen is a 63 y.o. female that is presenting with worsening left hip pain.  She received a greater trochanteric bursa injection with limited improvement.  The pain is become severe and has had trouble walking.  The pain is over the anterior aspect of the hip.  She also feels the pain in the groin.  The pain is throbbing and worse at night when she lies down.  She does have some tightness of the quadricep muscles..  Independent review of the left hip x-ray from 4/24 shows significant degenerative changes and possible avascular necrosis of the left hip joint.   Review of Systems  Constitutional: Negative for fever.  HENT: Negative for congestion.   Respiratory: Negative for cough.   Cardiovascular: Negative for chest pain.  Gastrointestinal: Negative for abdominal pain.  Musculoskeletal: Positive for arthralgias and gait problem.  Skin: Negative for color change.  Neurological: Positive for weakness.  Hematological: Negative for adenopathy.    HISTORY: Past Medical, Surgical, Social, and Family History Reviewed & Updated per EMR.   Pertinent Historical Findings include:  Past Medical History:  Diagnosis Date  . Chronic anemia   . Hypertension   . Kidney stones   . Lupus (HCC)   . Retinal detachment   . Spasmodic dysphonia   . Stroke [facility]) 2015    Past Surgical History:  Procedure Laterality Date  . ABDOMINAL HYSTERECTOMY    . KNEE SURGERY Left   . THYROID SURGERY      Allergies  Allergen Reactions  . Plaquenil [Hydroxychloroquine Sulfate] Anaphylaxis and Other (See Comments)    Blisters all over body  . Azithromycin Nausea And Vomiting  . Lactose Intolerance (Gi) Other (See Comments)    Upset stomach  . Penicillin G Itching and Nausea Only    Did it involve swelling of the face/tongue/throat, SOB, or low BP? No No Did it  involve sudden or severe rash/hives, skin peeling, or any reaction on the inside of your mouth or nose? Np No Did you need to seek medical attention at a hospital or doctor's office? Yes Yes When did it last happen?around 2010 If all above answers are "NO", may proceed with cephalosporin use.   Marland Kitchen Hydroxychloroquine Sulfate Rash and Other (See Comments)    blisters    Family History  Problem Relation Age of Onset  . Cancer Mother        liver  . Diabetes Mother   . Cancer Father        prostate,METS, stomach      Social History   Socioeconomic History  . Marital status: Single    Spouse name: Not on file  . Number of children: 2  . Years of education: college  . Highest education level: Not on file  Occupational History  . Occupation: options  Social Needs  . Financial resource strain: Not hard at all  . Food insecurity:    Worry: Never true    Inability: Never true  . Transportation needs:    Medical: No    Non-medical: No  Tobacco Use  . Smoking status: Never Smoker  . Smokeless tobacco: Never Used  Substance and Sexual Activity  . Alcohol use: No  . Drug use: No  . Sexual activity: Not Currently  Lifestyle  . Physical activity:    Days per week:  3 days    Minutes per session: 30 min  . Stress: Not at all  Relationships  . Social connections:    Talks on phone: More than three times a week    Gets together: More than three times a week    Attends religious service: More than 4 times per year    Active member of club or organization: Yes    Attends meetings of clubs or organizations: More than 4 times per year    Relationship status: Widowed  . Intimate partner violence:    Fear of current or ex partner: Not on file    Emotionally abused: Not on file    Physically abused: Not on file    Forced sexual activity: Not on file  Other Topics Concern  . Not on file  Social History Narrative  . Not on file     PHYSICAL EXAM:  VS: BP (!) 170/110   Pulse  88   Resp 16   SpO2 98%  Physical Exam Gen: NAD, alert, cooperative with exam, well-appearing ENT: normal lips, normal nasal mucosa,  Eye: normal EOM, normal conjunctiva and lids CV:  no edema, +2 pedal pulses   Resp: no accessory muscle use, non-labored,  Skin: no rashes, no areas of induration  Neuro: normal tone, normal sensation to touch Psych:  normal insight, alert and oriented MSK:  Left hip: Pain with internal rotation. Some weakness with resistance to hip flexion. Normal knee flexion and extension. Negative straight leg raise. Neurovascular intact   Aspiration/Injection Procedure Note Anderson MaltaJill L Troop Oct 07, 1955  Procedure: Injection Indications: Left hip pain  Procedure Details Consent: Risks of procedure as well as the alternatives and risks of each were explained to the (patient/caregiver).  Consent for procedure obtained. Time Out: Verified patient identification, verified procedure, site/side was marked, verified correct patient position, special equipment/implants available, medications/allergies/relevent history reviewed, required imaging and test results available.  Performed.  The area was cleaned with iodine and alcohol swabs.    The left hip joint was injected using 2 cc of 1% lidocaine without epinephrine, 1 cc's of 40 mg Kenalog and 2 cc's of 0.5% bupivacaine with a 22 3 1/2" needle.  Ultrasound was used. Images were obtained in long views showing the injection.     A sterile dressing was applied.  Patient did tolerate procedure well.       ASSESSMENT & PLAN:   OA (osteoarthritis) of hip Pain seems to be more related to the joint itself.  X-ray shows significant degenerative changes. -Hip joint injection today. -Referral to Ortho -Counseled on supportive care.

## 2018-05-06 NOTE — Assessment & Plan Note (Signed)
Pain seems to be more related to the joint itself.  X-ray shows significant degenerative changes. -Hip joint injection today. -Referral to Ortho -Counseled on supportive care.

## 2018-05-06 NOTE — Patient Instructions (Signed)
Good to see you  Please take tylenol for the pain  I have placed a referral to the surgeon and they should contact you to set up an appointment.  Please send me a message in [employer] with any questions or updates.

## 2018-05-23 ENCOUNTER — Encounter: Payer: Self-pay | Admitting: Family Medicine

## 2018-06-02 ENCOUNTER — Encounter: Payer: Self-pay | Admitting: Family Medicine

## 2018-06-08 ENCOUNTER — Telehealth: Payer: Self-pay | Admitting: Internal Medicine

## 2018-06-08 MED ORDER — LOSARTAN POTASSIUM-HCTZ 100-25 MG PO TABS: 1.0000 | ORAL_TABLET | Freq: Every day | ORAL | 1 refills | Status: DC

## 2018-06-08 NOTE — Telephone Encounter (Signed)
Copied from CRM [id number]. Topic: Quick Communication - Rx Refill/Question >> Jun 08, 2018  9:19 AM Randol Kern wrote: Medication: losartan-hydrochlorothiazide (HYZAAR) 100-25 MG tablet [[id number]]   Has the patient contacted their pharmacy? No (Agent: If no, request that the patient contact the pharmacy for the refill.) (Agent: If yes, when and what did the pharmacy advise?)  Preferred Pharmacy (with phone number or street name):  [id number] - [place], [place] - [address] AT [facility] [address] [place] [address] Phone: [phone] Fax: [phone]   Agent: Please be advised that RX refills may take up to 3 business days. We ask that you follow-up with your pharmacy.

## 2018-06-08 NOTE — Telephone Encounter (Signed)
Should be 1 pill daily and okay to fill until CPE

## 2018-06-08 NOTE — Telephone Encounter (Signed)
LOV with you for HTN was 06/05/2016. You advised pt to take Losartan/HCTZ bid. Patient never followed up as advised for HTN. She is scheduled with you for CPE 07/21/18. Please advise on sig for refill. Thanks!

## 2018-06-08 NOTE — Telephone Encounter (Signed)
Refill sent. See meds.  

## 2018-06-21 ENCOUNTER — Other Ambulatory Visit: Payer: Self-pay | Admitting: Orthopedic Surgery

## 2018-06-21 DIAGNOSIS — M25552 Pain in left hip: Secondary | ICD-10-CM | POA: Diagnosis not present

## 2018-06-22 NOTE — Patient Instructions (Addendum)
Elaine Allen    Your procedure is scheduled on: 1230 PM            [facility]             Report to admitting at  1230 PM   YOU  HAD  A COVID 19 TEST ON 06-23-2018 THIS TEST MUST BE DONE BEFORE SURGERY,  ONCE YOUR COVID TEST IS COMPLETED, PLEASE BEGIN THE QUARANTINE INSTRUCTIONS AS OUTLINED IN YOUR HANDOUT.   Call this number if you have problems the morning of surgery [phone]    Remember: . BRUSH YOUR TEETH MORNING OF SURGERY AND RINSE YOUR MOUTH OUT, NO CHEWING GUM CANDY OR MINTS.   NO SOLID FOOD AFTER MIDNIGHT THE NIGHT PRIOR TO SURGERY. NOTHING BY MOUTH EXCEPT CLEAR LIQUIDS UNTIL 1200 PM. PLEASE FINISH ENSURE DRINK PER SURGEON ORDER 3 HOURS PRIOR TO SCHEDULED SURGERY TIME WHICH NEEDS TO BE COMPLETED AT 1200 PM.    CLEAR LIQUID DIET   Foods Allowed                                                                     Foods Excluded  Coffee and tea, regular and decaf                             liquids that you cannot  Plain Jell-O in any flavor                                             see through such as: Fruit ices (not with fruit pulp)                                     milk, soups, orange juice  Iced Popsicles                                    All solid food Carbonated beverages, regular and diet                                    Cranberry, grape and apple juices Sports drinks like Gatorade Lightly seasoned clear broth or consume(fat free) Sugar, honey syrup  Sample Menu Breakfast                                Lunch                                     Supper Cranberry juice                    Beef broth  Chicken broth Jell-O                                     Grape juice                           Apple juice Coffee or tea                        Jell-O                                      Popsicle                                                Coffee or tea                        Coffee or  tea  _____________________________________________________________________    Take these medicines the morning of surgery with A SIP OF WATER: NONE                                You may not have any metal on your body including hair pins and              piercings  Do not wear jewelry, make-up, lotions, powders or perfumes, deodorant             Do not wear nail polish.  Do not shave  48 hours prior to surgery.                Do not bring valuables to the hospital. [place].  Contacts, dentures or bridgework may not be worn into surgery.  Leave suitcase in the car. After surgery it may be brought to your room.                   _____________________________________________________________________             [facility] - Preparing for Surgery Before surgery, you can play an important role.  Because skin is not sterile, your skin needs to be as free of germs as possible.  You can reduce the number of germs on your skin by washing with CHG (chlorahexidine gluconate) soap before surgery.  CHG is an antiseptic cleaner which kills germs and bonds with the skin to continue killing germs even after washing. Please DO NOT use if you have an allergy to CHG or antibacterial soaps.  If your skin becomes reddened/irritated stop using the CHG and inform your nurse when you arrive at Short Stay. Do not shave (including legs and underarms) for at least 48 hours prior to the first CHG shower.  You may shave your face/neck. Please follow these instructions carefully:  1.  Shower with CHG Soap the night before surgery and the  morning of Surgery.  2.  If you choose to wash your hair, wash your hair first as usual with your  normal  shampoo.  3.  After you shampoo, rinse your hair and body thoroughly to remove the  shampoo.                           4.  Use CHG as you would any other liquid soap.  You can apply chg directly  to the skin and wash                        Gently with a scrungie or clean washcloth.  5.  Apply the CHG Soap to your body ONLY FROM THE NECK DOWN.   Do not use on face/ open                           Wound or open sores. Avoid contact with eyes, ears mouth and genitals (private parts).                       Wash face,  Genitals (private parts) with your normal soap.             6.  Wash thoroughly, paying special attention to the area where your surgery  will be performed.  7.  Thoroughly rinse your body with warm water from the neck down.  8.  DO NOT shower/wash with your normal soap after using and rinsing off  the CHG Soap.                9.  Pat yourself dry with a clean towel.            10.  Wear clean pajamas.            11.  Place clean sheets on your bed the night of your first shower and do not  sleep with pets. Day of Surgery : Do not apply any lotions/deodorants the morning of surgery.  Please wear clean clothes to the hospital/surgery center.  FAILURE TO FOLLOW THESE INSTRUCTIONS MAY RESULT IN THE CANCELLATION OF YOUR SURGERY PATIENT SIGNATURE_________________________________  NURSE SIGNATURE__________________________________  ________________________________________________________________________   Elaine Allen  An incentive spirometer is a tool that can help keep your lungs clear and active. This tool measures how well you are filling your lungs with each breath. Taking long deep breaths may help reverse or decrease the chance of developing breathing (pulmonary) problems (especially infection) following:  A long period of time when you are unable to move or be active. BEFORE THE PROCEDURE   If the spirometer includes an indicator to show your best effort, your nurse or respiratory therapist will set it to a desired goal.  If possible, sit up straight or lean slightly forward. Try not to slouch.  Hold the incentive spirometer in an upright position. INSTRUCTIONS FOR USE  1. Sit on the  edge of your bed if possible, or sit up as far as you can in bed or on a chair. 2. Hold the incentive spirometer in an upright position. 3. Breathe out normally. 4. Place the mouthpiece in your mouth and seal your lips tightly around it. 5. Breathe in slowly and as deeply as possible, raising the piston or the ball toward the top of the column. 6. Hold your breath for 3-5 seconds or for as long as possible. Allow the piston or ball to fall to the bottom of the column. 7. Remove the mouthpiece from your mouth and breathe out normally. 8.  Rest for a few seconds and repeat Steps 1 through 7 at least 10 times every 1-2 hours when you are awake. Take your time and take a few normal breaths between deep breaths. 9. The spirometer may include an indicator to show your best effort. Use the indicator as a goal to work toward during each repetition. 10. After each set of 10 deep breaths, practice coughing to be sure your lungs are clear. If you have an incision (the cut made at the time of surgery), support your incision when coughing by placing a pillow or rolled up towels firmly against it. Once you are able to get out of bed, walk around indoors and cough well. You may stop using the incentive spirometer when instructed by your caregiver.  RISKS AND COMPLICATIONS  Take your time so you do not get dizzy or light-headed.  If you are in pain, you may need to take or ask for pain medication before doing incentive spirometry. It is harder to take a deep breath if you are having pain. AFTER USE  Rest and breathe slowly and easily.  It can be helpful to keep track of a log of your progress. Your caregiver can provide you with a simple table to help with this. If you are using the spirometer at home, follow these instructions: [place] IF:   You are having difficultly using the spirometer.  You have trouble using the spirometer as often as instructed.  Your pain medication is not giving enough  relief while using the spirometer.  You develop fever of 100.5 F (38.1 C) or higher. SEEK IMMEDIATE MEDICAL CARE IF:   You cough up bloody sputum that had not been present before.  You develop fever of 102 F (38.9 C) or greater.  You develop worsening pain at or near the incision site. MAKE SURE YOU:   Understand these instructions.  Will watch your condition.  Will get help right away if you are not doing well or get worse. Document Released: 05/04/2006 Document Revised: 03/16/2011 Document Reviewed: 07/05/2006 [employer] Patient Information 2014 [employer], [place].   ________________________________________________________________________  WHAT IS A BLOOD TRANSFUSION? Blood Transfusion Information  A transfusion is the replacement of blood or some of its parts. Blood is made up of multiple cells which provide different functions.  Red blood cells carry oxygen and are used for blood loss replacement.  White blood cells fight against infection.  Platelets control bleeding.  Plasma helps clot blood.  Other blood products are available for specialized needs, such as hemophilia or other clotting disorders. BEFORE THE TRANSFUSION  Who gives blood for transfusions?   Healthy volunteers who are fully evaluated to make sure their blood is safe. This is blood bank blood. Transfusion therapy is the safest it has ever been in the practice of medicine. Before blood is taken from a donor, a complete history is taken to make sure that person has no history of diseases nor engages in risky social behavior (examples are intravenous drug use or sexual activity with multiple partners). The donor's travel history is screened to minimize risk of transmitting infections, such as malaria. The donated blood is tested for signs of infectious diseases, such as HIV and hepatitis. The blood is then tested to be sure it is compatible with you in order to minimize the chance of a transfusion reaction. If  you or a relative donates blood, this is often done in anticipation of surgery and is not appropriate for emergency situations. It takes  many days to process the donated blood. RISKS AND COMPLICATIONS Although transfusion therapy is very safe and saves many lives, the main dangers of transfusion include:   Getting an infectious disease.  Developing a transfusion reaction. This is an allergic reaction to something in the blood you were given. Every precaution is taken to prevent this. The decision to have a blood transfusion has been considered carefully by your caregiver before blood is given. Blood is not given unless the benefits outweigh the risks. AFTER THE TRANSFUSION  Right after receiving a blood transfusion, you will usually feel much better and more energetic. This is especially true if your red blood cells have gotten low (anemic). The transfusion raises the level of the red blood cells which carry oxygen, and this usually causes an energy increase.  The nurse administering the transfusion will monitor you carefully for complications. HOME CARE INSTRUCTIONS  No special instructions are needed after a transfusion. You may find your energy is better. Speak with your caregiver about any limitations on activity for underlying diseases you may have. SEEK MEDICAL CARE IF:   Your condition is not improving after your transfusion.  You develop redness or irritation at the intravenous (IV) site. SEEK IMMEDIATE MEDICAL CARE IF:  Any of the following symptoms occur over the next 12 hours:  Shaking chills.  You have a temperature by mouth above 102 F (38.9 C), not controlled by medicine.  Chest, back, or muscle pain.  People around you feel you are not acting correctly or are confused.  Shortness of breath or difficulty breathing.  Dizziness and fainting.  You get a rash or develop hives.  You have a decrease in urine output.  Your urine turns a dark color or changes to pink,  red, or brown. Any of the following symptoms occur over the next 10 days:  You have a temperature by mouth above 102 F (38.9 C), not controlled by medicine.  Shortness of breath.  Weakness after normal activity.  The white part of the eye turns yellow (jaundice).  You have a decrease in the amount of urine or are urinating less often.  Your urine turns a dark color or changes to pink, red, or brown. Document Released: 12/20/1999 Document Revised: 03/16/2011 Document Reviewed: 08/08/2007 [facility] Patient Information 2014 [place], [place].  _______________________________________________________________________

## 2018-06-23 ENCOUNTER — Encounter (HOSPITAL_COMMUNITY)
Admission: RE | Admit: 2018-06-23 | Discharge: 2018-06-23 | Disposition: A | Discharge status: Home / Self Care | Payer: Medicare HMO | Source: Ambulatory Visit | Attending: Orthopedic Surgery | Admitting: Orthopedic Surgery

## 2018-06-23 ENCOUNTER — Other Ambulatory Visit (HOSPITAL_COMMUNITY)
Admission: RE | Admit: 2018-06-23 | Discharge: 2018-06-23 | Disposition: A | Discharge status: Home / Self Care | Payer: Medicare HMO | Source: Ambulatory Visit | Attending: Orthopedic Surgery | Admitting: Orthopedic Surgery

## 2018-06-23 ENCOUNTER — Encounter (HOSPITAL_COMMUNITY): Payer: Self-pay

## 2018-06-23 ENCOUNTER — Ambulatory Visit (HOSPITAL_COMMUNITY)
Admission: RE | Admit: 2018-06-23 | Discharge: 2018-06-23 | Disposition: A | Discharge status: Home / Self Care | Payer: Medicare HMO | Source: Ambulatory Visit | Attending: Orthopedic Surgery | Admitting: Orthopedic Surgery

## 2018-06-23 ENCOUNTER — Other Ambulatory Visit: Payer: Self-pay

## 2018-06-23 DIAGNOSIS — Z01818 Encounter for other preprocedural examination: Secondary | ICD-10-CM | POA: Insufficient documentation

## 2018-06-23 DIAGNOSIS — Z1159 Encounter for screening for other viral diseases: Secondary | ICD-10-CM | POA: Diagnosis not present

## 2018-06-23 DIAGNOSIS — M87059 Idiopathic aseptic necrosis of unspecified femur: Secondary | ICD-10-CM | POA: Diagnosis present

## 2018-06-23 HISTORY — DX: Dizziness and giddiness: R42

## 2018-06-23 HISTORY — DX: Blindness, one eye, unspecified eye: H54.40

## 2018-06-23 HISTORY — DX: Unspecified osteoarthritis, unspecified site: M19.90

## 2018-06-23 HISTORY — DX: Thyrotoxicosis, unspecified without thyrotoxic crisis or storm: E05.90

## 2018-06-23 HISTORY — DX: Serous retinal detachment, right eye: H33.21

## 2018-06-23 HISTORY — DX: Pneumonia, unspecified organism: J18.9

## 2018-06-23 HISTORY — DX: Personal history of urinary calculi: Z87.442

## 2018-06-23 LAB — CBC WITH DIFFERENTIAL/PLATELET
Abs Immature Granulocytes: 0 10*3/uL (ref 0.00–0.07)
Basophils Absolute: 0 10*3/uL (ref 0.0–0.1)
Basophils Relative: 1 %
Eosinophils Absolute: 0 10*3/uL (ref 0.0–0.5)
Eosinophils Relative: 1 %
HCT: 40.4 % (ref 36.0–46.0)
Hemoglobin: 12.7 g/dL (ref 12.0–15.0)
Immature Granulocytes: 0 %
Lymphocytes Relative: 46 %
Lymphs Abs: 1.4 10*3/uL (ref 0.7–4.0)
MCH: 28.3 pg (ref 26.0–34.0)
MCHC: 31.4 g/dL (ref 30.0–36.0)
MCV: 90.2 fL (ref 80.0–100.0)
Monocytes Absolute: 0.3 10*3/uL (ref 0.1–1.0)
Monocytes Relative: 10 %
Neutro Abs: 1.2 10*3/uL — ABNORMAL LOW (ref 1.7–7.7)
Neutrophils Relative %: 42 %
Platelets: 179 10*3/uL (ref 150–400)
RBC: 4.48 MIL/uL (ref 3.87–5.11)
RDW: 13.8 % (ref 11.5–15.5)
WBC: 2.9 10*3/uL — ABNORMAL LOW (ref 4.0–10.5)
nRBC: 0 % (ref 0.0–0.2)

## 2018-06-23 LAB — BASIC METABOLIC PANEL
Anion gap: 14 (ref 5–15)
BUN: 22 mg/dL (ref 8–23)
CO2: 23 mmol/L (ref 22–32)
Calcium: 9.6 mg/dL (ref 8.9–10.3)
Chloride: 105 mmol/L (ref 98–111)
Creatinine, Ser: 1.1 mg/dL — ABNORMAL HIGH (ref 0.44–1.00)
GFR calc Af Amer: >60 mL/min (ref >60)
GFR calc non Af Amer: 53 mL/min — ABNORMAL LOW (ref >60)
Glucose, Bld: 96 mg/dL (ref 70–99)
Potassium: 3.4 mmol/L — ABNORMAL LOW (ref 3.5–5.1)
Sodium: 142 mmol/L (ref 135–145)

## 2018-06-23 LAB — SURGICAL PCR SCREEN
MRSA, PCR: NEGATIVE
Staphylococcus aureus: NEGATIVE

## 2018-06-23 LAB — SARS CORONAVIRUS 2 (TAT 6-24 HRS): SARS Coronavirus 2: NEGATIVE

## 2018-06-23 LAB — APTT: aPTT: 39 seconds — ABNORMAL HIGH (ref 24–36)

## 2018-06-23 LAB — PROTIME-INR
INR: 1 (ref 0.8–1.2)
Prothrombin Time: 13.1 seconds (ref 11.4–15.2)

## 2018-06-23 NOTE — H&P (Addendum)
TOTAL HIP ADMISSION H&P  Patient is admitted for left total hip arthroplasty.  Subjective:  Chief Complaint: left hip pain  HPI: Elaine Allen, 63 y.o. female, has a history of pain and functional disability in the left hip(s) due to AVN and patient has failed non-surgical conservative treatments for greater than 12 weeks to include NSAID's and/or analgesics, use of assistive devices and activity modification.  Onset of symptoms was abrupt starting 1 years ago with rapidlly worsening course since that time.The patient noted no past surgery on the left hip(s).  Patient currently rates pain in the left hip at 10 out of 10 with activity. Patient has night pain, worsening of pain with activity and weight bearing, trendelenberg gait, pain that interfers with activities of daily living and pain with passive range of motion. Patient has evidence of joint space narrowing and AVN by imaging studies. This condition presents safety issues increasing the risk of falls. There is no current active infection.  Patient Active Problem List   Diagnosis Date Noted  . Greater trochanteric bursitis of left hip 04/08/2018  . Left knee pain 04/06/2018  . Numbness in feet 09/14/2017  . Elevated LFTs 02/24/2017  . Pre-diabetes 02/19/2017  . Fatigue 02/19/2017  . OA (osteoarthritis) of hip 10/29/2016  . Numbness and tingling in left arm 05/05/2016  . Cough 12/25/2015  . Wheezing 12/25/2015  . Abdominal pain 12/25/2015  . Back pain 10/01/2015  . Insomnia 03/01/2015  . Family hx of colon cancer 11/12/2014  . Essential hypertension, benign 05/15/2013  . Other and unspecified hyperlipidemia 05/15/2013  . Vertebral artery dissection (HCC) 05/14/2013  . Hx of completed stroke 05/14/2013  . Chronic anemia   . OSTEOARTHRITIS, KNEE, RIGHT, SEVERE 12/19/2007  . Primary hyperparathyroidism (HCC) 11/24/2007  . OSTEOPENIA 11/24/2007  . Lupus (systemic lupus erythematosus) (HCC) 01/05/2002   Past Medical History:   Diagnosis Date  . Chronic anemia   . Hypertension   . Kidney stones   . Lupus (HCC)   . Retinal detachment   . Spasmodic dysphonia   . Stroke [facility](HCC) 2015    Past Surgical History:  Procedure Laterality Date  . ABDOMINAL HYSTERECTOMY    . KNEE SURGERY Left   . THYROID SURGERY      No current facility-administered medications for this encounter.    Current Outpatient Medications  Medication Sig Dispense Refill Last Dose  . cholecalciferol (VITAMIN D) 25 MCG (1000 UT) tablet Take 2,000 Units by mouth daily.     Marland Kitchen. ibuprofen (ADVIL,MOTRIN) 200 MG tablet Take 800 mg by mouth every 6 (six) hours as needed for moderate pain.      Marland Kitchen. losartan-hydrochlorothiazide (HYZAAR) 100-25 MG tablet Take 1 tablet by mouth daily. 30 tablet 1   . eszopiclone (LUNESTA) 2 MG TABS tablet Take 1 tablet (2 mg total) by mouth at bedtime as needed for sleep. Take immediately before bedtime (Patient not taking: Reported on 06/21/2018) 30 tablet 1 Not Taking at Unknown time  . gabapentin (NEURONTIN) 100 MG capsule Take 1 capsule (100 mg total) by mouth 3 (three) times daily. (Patient not taking: Reported on 06/21/2018) 90 capsule 3 Not Taking at Unknown time  . meloxicam (MOBIC) 7.5 MG tablet Take 1 tablet (7.5 mg total) by mouth daily. (Patient not taking: Reported on 06/21/2018) 20 tablet 0 Not Taking at Unknown time  . predniSONE (DELTASONE) 20 MG tablet Take 2 pills daily for 1 week, then 1 pill daily for 5 days then stop. (Patient not taking: Reported on  06/21/2018) 20 tablet 0 Not Taking at Unknown time   Allergies  Allergen Reactions  . Plaquenil [Hydroxychloroquine Sulfate] Anaphylaxis and Other (See Comments)    Blisters all over body  . Azithromycin Nausea And Vomiting  . Lactose Intolerance (Gi) Other (See Comments)    Upset stomach  . Penicillin G Itching and Nausea Only    Did it involve swelling of the face/tongue/throat, SOB, or low BP? No No Did it involve sudden or severe rash/hives, skin peeling,  or any reaction on the inside of your mouth or nose? Np No Did you need to seek medical attention at a hospital or doctor's office? Yes Yes When did it last happen?around 2010 If all above answers are "NO", may proceed with cephalosporin use.     Social History   Tobacco Use  . Smoking status: Never Smoker  . Smokeless tobacco: Never Used  Substance Use Topics  . Alcohol use: No    Family History  Problem Relation Age of Onset  . Cancer Mother        liver  . Diabetes Mother   . Cancer Father        prostate,METS, stomach      Review of Systems  Constitutional: Negative.   HENT: Positive for nosebleeds.   Eyes: Negative.   Respiratory: Negative.   Cardiovascular: Negative.   Gastrointestinal: Positive for nausea.  Genitourinary: Negative.   Musculoskeletal: Positive for joint pain.  Skin: Positive for rash.  Neurological: Negative.   Psychiatric/Behavioral: Positive for depression.    Objective:  Physical Exam  Constitutional: She is oriented to person, place, and time. She appears well-developed and well-nourished.  HENT:  Head: Normocephalic and atraumatic.  Eyes: Pupils are equal, round, and reactive to light.  Neck: Normal range of motion. Neck supple.  Cardiovascular: Intact distal pulses.  Respiratory: Effort normal.  Musculoskeletal:        General: Tenderness present.     Comments: No erythema or warmth noted.  Tenderness to palpation over the groin.  Patient with extreme limitations in range of motion.  Significant pain with any internal or external rotation.  Patient unable to actively flex hip.  Passive hip flexion limited due to pain.  Strength 4 out of 5 throughout testing.  Unable to perform special testing due to pain.  Neurological: She is alert and oriented to person, place, and time.  Skin: Skin is warm and dry.  Psychiatric: She has a normal mood and affect. Her behavior is normal. Judgment and thought content normal.    Vital signs in last  24 hours: Temp:  [98.7 F (37.1 C)] 98.7 F (37.1 C) (06/18 1530) Pulse Rate:  [119] 119 (06/18 1530) Resp:  [18] 18 (06/18 1530) BP: (149)/(100) 149/100 (06/18 1530) SpO2:  [98 %] 98 % (06/18 1530) Weight:  [78.2 kg] 78.2 kg (06/18 1530)  Labs:   Estimated body mass index is 29.58 kg/m as calculated from the following:   Height as of 06/23/18: 5\' 4"  (1.626 m).   Weight as of 06/23/18: 78.2 kg.   Imaging Review Plain radiographs demonstrate  avascular necrosis of the left hip with collapse of the superior aspect of the femoral head.      Assessment/Plan:  End stage arthritis, AVN left hip(s)  The patient history, physical examination, clinical judgement of the provider and imaging studies are consistent with end stage degenerative joint disease of the left hip(s) and total hip arthroplasty is deemed medically necessary. The treatment options including medical management,  injection therapy, arthroscopy and arthroplasty were discussed at length. The risks and benefits of total hip arthroplasty were presented and reviewed. The risks due to aseptic loosening, infection, stiffness, dislocation/subluxation,  thromboembolic complications and other imponderables were discussed.  The patient acknowledged the explanation, agreed to proceed with the plan and consent was signed. Patient is being admitted for inpatient treatment for surgery, pain control, PT, OT, prophylactic antibiotics, VTE prophylaxis, progressive ambulation and ADL's and discharge planning.The patient is planning to be discharged home with home health services

## 2018-06-24 ENCOUNTER — Telehealth: Payer: Self-pay

## 2018-06-24 ENCOUNTER — Encounter: Payer: Self-pay | Admitting: Internal Medicine

## 2018-06-24 ENCOUNTER — Ambulatory Visit (INDEPENDENT_AMBULATORY_CARE_PROVIDER_SITE_OTHER): Payer: Medicare HMO | Admitting: Internal Medicine

## 2018-06-24 DIAGNOSIS — Z01818 Encounter for other preprocedural examination: Secondary | ICD-10-CM | POA: Diagnosis not present

## 2018-06-24 LAB — ABO/RH: ABO/RH(D): O POS

## 2018-06-24 NOTE — Assessment & Plan Note (Signed)
EKG reviewed and discussed with patient. CXR reviewed and discussed with patient. Given lupus she is at increased risk of CV disease so symptom assessment done and negative. METS not at goal due to pain but previously at goal without symptoms. Counseled about the likelihood of constipation with opioids and needing to start stool softener or more to prevent constipation.

## 2018-06-24 NOTE — Anesthesia Preprocedure Evaluation (Addendum)
Anesthesia Evaluation  Patient identified by MRN, date of birth, ID band Patient awake    Reviewed: Allergy & Precautions, H&P , NPO status , Patient's Chart, lab work & pertinent test results, reviewed documented beta blocker date and time   Airway Mallampati: II  TM Distance: >3 FB Neck ROM: full    Dental no notable dental hx. (+) Teeth Intact   Pulmonary neg pulmonary ROS,    Pulmonary exam normal breath sounds clear to auscultation       Cardiovascular Exercise Tolerance: Good hypertension, Pt. on medications negative cardio ROS   Rhythm:regular Rate:Normal     Neuro/Psych CVA, Residual Symptoms negative psych ROS   GI/Hepatic negative GI ROS, Neg liver ROS,   Endo/Other  Hyperthyroidism   Renal/GU negative Renal ROS  negative genitourinary   Musculoskeletal  (+) Arthritis , Osteoarthritis,    Abdominal   Peds  Hematology  (+) Blood dyscrasia, anemia ,   Anesthesia Other Findings   Reproductive/Obstetrics negative OB ROS                           Anesthesia Physical Anesthesia Plan  ASA: III  Anesthesia Plan: Spinal   Post-op Pain Management:    Induction:   PONV Risk Score and Plan: 2 and Ondansetron  Airway Management Planned: Nasal Cannula  Additional Equipment:   Intra-op Plan:   Post-operative Plan:   Informed Consent: I have reviewed the patients History and Physical, chart, labs and discussed the procedure including the risks, benefits and alternatives for the proposed anesthesia with the patient or authorized representative who has indicated his/her understanding and acceptance.     Dental Advisory Given  Plan Discussed with: CRNA, Anesthesiologist and Surgeon  Anesthesia Plan Comments: (See PAT note 06/23/2018, Konrad Felix, PA-C)     Anesthesia Quick Evaluation

## 2018-06-24 NOTE — Progress Notes (Signed)
Virtual Visit via Video Note  I connected with Elaine Allen on 06/24/18 at  3:00 PM EDT by a video enabled telemedicine application and verified that I am speaking with the correct person using two identifiers.  The patient and the provider were at separate locations throughout the entire encounter.   I discussed the limitations of evaluation and management by telemedicine and the availability of in person appointments. The patient expressed understanding and agreed to proceed.  History of Present Illness: The patient is a 63 y.o. female with visit for pre-op clearance. Has been having more problems with hip pain and walking in the last several months. She does have need for hip replacement urgently and this is scheduled for Monday. She denies current chest pains or SOB or chest tightness. Had pre-op visit earlier this week and EKG reviewed with patient. CXR done and reviewed with patient. Mobility is limited by hip pain but denies SOB and can climb a flight of stairs slowly. Started months ago. Has not responded to shots or oral medications. Denies constipation, diarrhea or nausea. Denies fevers or chills or cough. Covid-19 testing negative at pre-op and she has been staying in since that time. Overall it is worsening.  PMH, [place], social history reviewed and updated  Observations/Objective: Appearance: normal, breathing appears normal, casual grooming, abdomen does not appear distended, throat normal, memory normal, mental status is A and O times 3  Assessment and Plan: See problem oriented charting  Follow Up Instructions: clear as low risk for upcoming surgery, counseled about bowel regimen while on opioids.   I discussed the assessment and treatment plan with the patient. The patient was provided an opportunity to ask questions and all were answered. The patient agreed with the plan and demonstrated an understanding of the instructions.   The patient was advised to call back or seek an  in-person evaluation if the symptoms worsen or if the condition fails to improve as anticipated.  Hoyt Koch, MD

## 2018-06-24 NOTE — Telephone Encounter (Signed)
Copied from [place] [id number]. Topic: General - Other >> Jun 24, 2018  9:28 AM Carolyn Stare wrote: Elaine Allen with [facility] call about medical relese form that was faxed over on 06/22/2018 pt is have emergency surgery and they need the form sent back today

## 2018-06-24 NOTE — Telephone Encounter (Signed)
Spoke with [name] from [facility] and let her know that we sent over surgical clearance request to her PCP for her to fill out the form for the urgent surgery form for pt.

## 2018-06-24 NOTE — Telephone Encounter (Signed)
Copied from [place] [id number]. Topic: General - Other >> Jun 23, 2018  3:59 PM Leward Quan A wrote: Reason for CRM: [name] with [facility] called for Clearance [place] to say that they sent over a surgical clearance form and have yet to get any response. Patient is scheduled for an emergent surgery on 06/27/2018 and this form is needed so surgeon can proceed. Please advise Ph# [phone]

## 2018-06-24 NOTE — Telephone Encounter (Signed)
Done and faxed

## 2018-06-24 NOTE — Progress Notes (Signed)
Spoke with [name] Dr. Shaune Spittle nurse to inform that pt. Was unable to void after 2 hours and multiple cups of water at preop appt. She stated she would talk with Doctor Mayer Camel to see if he wanted a UA DOS . Left [name] my OFFICE #.

## 2018-06-24 NOTE — Progress Notes (Signed)
Anesthesia Chart Review   Case: [id number] Date/Time: 06/27/18 1455   Procedure: TOTAL HIP ARTHROPLASTY ANTERIOR APPROACH (Left )   Anesthesia type: Spinal   Pre-op diagnosis: Left Hip Femoral Head Fracture and Collapse   Location: [facility]   Surgeon: Frederik Pear, MD      DISCUSSION: 63 yo never smoker with h/o Lupus, HTN, hyperthyroidism, CVA 2015, left hip femoral head  fracture and collapse scheduled for above procedure 06/27/2018 with Dr. Frederik Pear.   Pt last seen by PCP, Dr. Pricilla Holm, 06/24/2018.  Per OV note, "Clear as low risk for upcoming surgery, counseled about bowel regimen while on opioids."  Pt can proceed with planned procedure barring acute status change.  VS: BP (!) 149/100 (BP Location: Right Arm) Comment: Notified RN  Pulse (!) 119   Temp 37.1 C (Oral)   Resp 18   Ht 5\' 4"  (1.626 m)   Wt 78.2 kg   SpO2 98%   BMI 29.58 kg/m   PROVIDERS: Hoyt Koch, MD is PCP    LABS: Labs reviewed: Acceptable for surgery. (all labs ordered are listed, but only abnormal results are displayed)  Labs Reviewed  APTT - Abnormal; Notable for the following components:      Result Value   aPTT 39 (*)    All other components within normal limits  BASIC METABOLIC PANEL - Abnormal; Notable for the following components:   Potassium 3.4 (*)    Creatinine, Ser 1.10 (*)    GFR calc non Af Amer 53 (*)    All other components within normal limits  CBC WITH DIFFERENTIAL/PLATELET - Abnormal; Notable for the following components:   WBC 2.9 (*)    Neutro Abs 1.2 (*)    All other components within normal limits  SURGICAL PCR SCREEN  PROTIME-INR  TYPE AND SCREEN  ABO/RH     IMAGES: Chest Xray 06/23/2018 FINDINGS: The heart size and mediastinal contours are within normal limits. Both lungs are clear. The visualized skeletal structures are unremarkable.  IMPRESSION: No active cardiopulmonary disease.  EKG: 06/23/2018 Rate 92 bpm Normal sinus  rhythm  Possible left atrial enlargement  CV:  Past Medical History:  Diagnosis Date  . Arthritis   . Blind right eye    sees colors  . Chronic anemia   . Detached retina, right    corrected good for about 6 months sees peripheral colors only  . History of kidney stones   . Hypertension   . Hyperthyroidism   . Lupus ([place])   . Pneumonia   . Retinal detachment   . Spasmodic dysphonia   . Stroke ([place]) 2015   no permanant defecits had weakness in left arm in hospital Had PT also  . Vertigo    Hx of preceding stroke    Past Surgical History:  Procedure Laterality Date  . ABDOMINAL HYSTERECTOMY    . BREAST LUMPECTOMY     left breast benign  . KNEE SURGERY Left   . THYROID SURGERY      MEDICATIONS: . cholecalciferol (VITAMIN D) 25 MCG (1000 UT) tablet  . eszopiclone (LUNESTA) 2 MG TABS tablet  . gabapentin (NEURONTIN) 100 MG capsule  . ibuprofen (ADVIL,MOTRIN) 200 MG tablet  . losartan-hydrochlorothiazide (HYZAAR) 100-25 MG tablet  . meloxicam (MOBIC) 7.5 MG tablet  . predniSONE (DELTASONE) 20 MG tablet   No current facility-administered medications for this encounter.     Maia Plan [facility] Pre-Surgical Testing [phone] 06/24/18 3:55 PM

## 2018-06-24 NOTE — Telephone Encounter (Signed)
Placed outside your folder

## 2018-06-26 MED ORDER — BUPIVACAINE LIPOSOME 1.3 % IJ SUSP
10.0000 mL | Freq: Once | INTRAMUSCULAR | Status: DC
  Filled 2018-06-26: qty 10

## 2018-06-26 MED ORDER — TRANEXAMIC ACID 1000 MG/10ML IV SOLN
2000.0000 mg | INTRAVENOUS | Status: DC
  Filled 2018-06-26: qty 20

## 2018-06-27 ENCOUNTER — Observation Stay (HOSPITAL_COMMUNITY): Payer: Medicare HMO

## 2018-06-27 ENCOUNTER — Observation Stay (HOSPITAL_COMMUNITY)
Admission: RE | Admit: 2018-06-27 | Discharge: 2018-06-29 | Disposition: A | Discharge status: Home / Self Care | Payer: Medicare HMO | Attending: Orthopedic Surgery | Admitting: Orthopedic Surgery

## 2018-06-27 ENCOUNTER — Encounter (HOSPITAL_COMMUNITY): Payer: Self-pay | Admitting: *Deleted

## 2018-06-27 ENCOUNTER — Ambulatory Visit (HOSPITAL_COMMUNITY): Payer: Medicare HMO | Admitting: Physician Assistant

## 2018-06-27 ENCOUNTER — Encounter (HOSPITAL_COMMUNITY)
Admission: RE | Disposition: A | Discharge status: Home / Self Care | Payer: Self-pay | Source: Home / Self Care | Attending: Orthopedic Surgery

## 2018-06-27 ENCOUNTER — Other Ambulatory Visit: Payer: Self-pay

## 2018-06-27 ENCOUNTER — Ambulatory Visit (HOSPITAL_COMMUNITY): Payer: Medicare HMO | Admitting: Certified Registered"

## 2018-06-27 ENCOUNTER — Ambulatory Visit (HOSPITAL_COMMUNITY): Payer: Medicare HMO

## 2018-06-27 DIAGNOSIS — Z79899 Other long term (current) drug therapy: Secondary | ICD-10-CM | POA: Insufficient documentation

## 2018-06-27 DIAGNOSIS — Z833 Family history of diabetes mellitus: Secondary | ICD-10-CM | POA: Insufficient documentation

## 2018-06-27 DIAGNOSIS — Z880 Allergy status to penicillin: Secondary | ICD-10-CM | POA: Insufficient documentation

## 2018-06-27 DIAGNOSIS — X58XXXA Exposure to other specified factors, initial encounter: Secondary | ICD-10-CM | POA: Insufficient documentation

## 2018-06-27 DIAGNOSIS — H5461 Unqualified visual loss, right eye, normal vision left eye: Secondary | ICD-10-CM | POA: Diagnosis not present

## 2018-06-27 DIAGNOSIS — G4700 Insomnia, unspecified: Secondary | ICD-10-CM | POA: Insufficient documentation

## 2018-06-27 DIAGNOSIS — Z800 Family history of malignant neoplasm of digestive organs: Secondary | ICD-10-CM | POA: Diagnosis not present

## 2018-06-27 DIAGNOSIS — R269 Unspecified abnormalities of gait and mobility: Secondary | ICD-10-CM | POA: Insufficient documentation

## 2018-06-27 DIAGNOSIS — R42 Dizziness and giddiness: Secondary | ICD-10-CM | POA: Diagnosis not present

## 2018-06-27 DIAGNOSIS — M87052 Idiopathic aseptic necrosis of left femur: Secondary | ICD-10-CM | POA: Diagnosis not present

## 2018-06-27 DIAGNOSIS — M1610 Unilateral primary osteoarthritis, unspecified hip: Secondary | ICD-10-CM | POA: Diagnosis not present

## 2018-06-27 DIAGNOSIS — M329 Systemic lupus erythematosus, unspecified: Secondary | ICD-10-CM | POA: Diagnosis not present

## 2018-06-27 DIAGNOSIS — R7303 Prediabetes: Secondary | ICD-10-CM | POA: Diagnosis not present

## 2018-06-27 DIAGNOSIS — Z881 Allergy status to other antibiotic agents status: Secondary | ICD-10-CM | POA: Insufficient documentation

## 2018-06-27 DIAGNOSIS — M87852 Other osteonecrosis, left femur: Secondary | ICD-10-CM | POA: Diagnosis not present

## 2018-06-27 DIAGNOSIS — M25552 Pain in left hip: Secondary | ICD-10-CM | POA: Diagnosis not present

## 2018-06-27 DIAGNOSIS — S72092A Other fracture of head and neck of left femur, initial encounter for closed fracture: Secondary | ICD-10-CM | POA: Insufficient documentation

## 2018-06-27 DIAGNOSIS — I10 Essential (primary) hypertension: Secondary | ICD-10-CM | POA: Diagnosis not present

## 2018-06-27 DIAGNOSIS — Z791 Long term (current) use of non-steroidal anti-inflammatories (NSAID): Secondary | ICD-10-CM | POA: Insufficient documentation

## 2018-06-27 DIAGNOSIS — M879 Osteonecrosis, unspecified: Principal | ICD-10-CM | POA: Insufficient documentation

## 2018-06-27 DIAGNOSIS — Z7982 Long term (current) use of aspirin: Secondary | ICD-10-CM | POA: Insufficient documentation

## 2018-06-27 DIAGNOSIS — G8929 Other chronic pain: Secondary | ICD-10-CM | POA: Insufficient documentation

## 2018-06-27 DIAGNOSIS — E0590 Thyrotoxicosis, unspecified without thyrotoxic crisis or storm: Secondary | ICD-10-CM | POA: Insufficient documentation

## 2018-06-27 DIAGNOSIS — I639 Cerebral infarction, unspecified: Secondary | ICD-10-CM | POA: Diagnosis not present

## 2018-06-27 DIAGNOSIS — Z419 Encounter for procedure for purposes other than remedying health state, unspecified: Secondary | ICD-10-CM

## 2018-06-27 DIAGNOSIS — M8580 Other specified disorders of bone density and structure, unspecified site: Secondary | ICD-10-CM | POA: Diagnosis not present

## 2018-06-27 DIAGNOSIS — E210 Primary hyperparathyroidism: Secondary | ICD-10-CM | POA: Diagnosis not present

## 2018-06-27 DIAGNOSIS — M7062 Trochanteric bursitis, left hip: Secondary | ICD-10-CM | POA: Diagnosis not present

## 2018-06-27 DIAGNOSIS — M1612 Unilateral primary osteoarthritis, left hip: Secondary | ICD-10-CM | POA: Diagnosis not present

## 2018-06-27 DIAGNOSIS — Z87442 Personal history of urinary calculi: Secondary | ICD-10-CM | POA: Insufficient documentation

## 2018-06-27 DIAGNOSIS — Z8042 Family history of malignant neoplasm of prostate: Secondary | ICD-10-CM | POA: Insufficient documentation

## 2018-06-27 DIAGNOSIS — S72052A Unspecified fracture of head of left femur, initial encounter for closed fracture: Secondary | ICD-10-CM | POA: Diagnosis not present

## 2018-06-27 DIAGNOSIS — Z888 Allergy status to other drugs, medicaments and biological substances status: Secondary | ICD-10-CM | POA: Diagnosis not present

## 2018-06-27 DIAGNOSIS — M87059 Idiopathic aseptic necrosis of unspecified femur: Secondary | ICD-10-CM | POA: Diagnosis present

## 2018-06-27 DIAGNOSIS — Z90710 Acquired absence of both cervix and uterus: Secondary | ICD-10-CM | POA: Diagnosis not present

## 2018-06-27 DIAGNOSIS — E739 Lactose intolerance, unspecified: Secondary | ICD-10-CM | POA: Insufficient documentation

## 2018-06-27 HISTORY — PX: TOTAL HIP ARTHROPLASTY: SHX124

## 2018-06-27 LAB — URINALYSIS, ROUTINE W REFLEX MICROSCOPIC
Bilirubin Urine: NEGATIVE
Glucose, UA: NEGATIVE mg/dL
Hgb urine dipstick: NEGATIVE
Ketones, ur: NEGATIVE mg/dL
Leukocytes,Ua: NEGATIVE
Nitrite: NEGATIVE
Protein, ur: NEGATIVE mg/dL
Specific Gravity, Urine: 1.02 (ref 1.005–1.030)
pH: 5 (ref 5.0–8.0)

## 2018-06-27 LAB — TYPE AND SCREEN
ABO/RH(D): O POS
Antibody Screen: NEGATIVE

## 2018-06-27 SURGERY — ARTHROPLASTY, HIP, TOTAL, ANTERIOR APPROACH
Anesthesia: Spinal | Laterality: Left

## 2018-06-27 MED ORDER — PROPOFOL 10 MG/ML IV BOLUS
INTRAVENOUS | Status: AC
  Filled 2018-06-27: qty 20

## 2018-06-27 MED ORDER — BUPIVACAINE IN DEXTROSE 0.75-8.25 % IT SOLN
INTRATHECAL | Status: DC | PRN
  Administered 2018-06-27: 1.9 mL via INTRATHECAL

## 2018-06-27 MED ORDER — FENTANYL CITRATE (PF) 100 MCG/2ML IJ SOLN: 25.0000 ug | INTRAMUSCULAR | Status: DC | PRN

## 2018-06-27 MED ORDER — FENTANYL CITRATE (PF) 100 MCG/2ML IJ SOLN
INTRAMUSCULAR | Status: DC | PRN
  Administered 2018-06-27: 100 ug via INTRAVENOUS

## 2018-06-27 MED ORDER — KCL IN DEXTROSE-NACL 20-5-0.45 MEQ/L-%-% IV SOLN
INTRAVENOUS | Status: DC
  Administered 2018-06-27 (×2): via INTRAVENOUS
  Filled 2018-06-27 (×2): qty 1000

## 2018-06-27 MED ORDER — DEXAMETHASONE SODIUM PHOSPHATE 10 MG/ML IJ SOLN
10.0000 mg | Freq: Once | INTRAMUSCULAR | Status: DC
  Filled 2018-06-27: qty 1

## 2018-06-27 MED ORDER — BUPIVACAINE-EPINEPHRINE 0.25% -1:200000 IJ SOLN
INTRAMUSCULAR | Status: DC | PRN
  Administered 2018-06-27: 10 mL

## 2018-06-27 MED ORDER — OXYCODONE HCL 5 MG PO TABS
5.0000 mg | ORAL_TABLET | ORAL | Status: DC | PRN
  Administered 2018-06-27: 5 mg via ORAL
  Filled 2018-06-27 (×2): qty 1

## 2018-06-27 MED ORDER — LOSARTAN POTASSIUM-HCTZ 100-25 MG PO TABS: 1.0000 | ORAL_TABLET | Freq: Every day | ORAL | Status: DC

## 2018-06-27 MED ORDER — 0.9 % SODIUM CHLORIDE (POUR BTL) OPTIME
TOPICAL | Status: DC | PRN
  Administered 2018-06-27: 1000 mL

## 2018-06-27 MED ORDER — LIDOCAINE 2% (20 MG/ML) 5 ML SYRINGE
INTRAMUSCULAR | Status: AC
  Filled 2018-06-27: qty 5

## 2018-06-27 MED ORDER — DEXAMETHASONE SODIUM PHOSPHATE 10 MG/ML IJ SOLN
INTRAMUSCULAR | Status: DC | PRN
  Administered 2018-06-27: 10 mg via INTRAVENOUS

## 2018-06-27 MED ORDER — ASPIRIN 81 MG PO CHEW
81.0000 mg | CHEWABLE_TABLET | Freq: Two times a day (BID) | ORAL | Status: DC
  Administered 2018-06-28 – 2018-06-29 (×3): 81 mg via ORAL
  Filled 2018-06-27 (×3): qty 1

## 2018-06-27 MED ORDER — BUPIVACAINE LIPOSOME 1.3 % IJ SUSP
INTRAMUSCULAR | Status: DC | PRN
  Administered 2018-06-27: 10 mL

## 2018-06-27 MED ORDER — LOSARTAN POTASSIUM 50 MG PO TABS
100.0000 mg | ORAL_TABLET | Freq: Every day | ORAL | Status: DC
  Administered 2018-06-28 – 2018-06-29 (×2): 100 mg via ORAL
  Filled 2018-06-27 (×2): qty 2

## 2018-06-27 MED ORDER — TRANEXAMIC ACID-NACL 1000-0.7 MG/100ML-% IV SOLN
1000.0000 mg | INTRAVENOUS | Status: AC
  Administered 2018-06-27: 1000 mg via INTRAVENOUS
  Filled 2018-06-27 (×2): qty 100

## 2018-06-27 MED ORDER — DEXAMETHASONE SODIUM PHOSPHATE 10 MG/ML IJ SOLN
INTRAMUSCULAR | Status: AC
  Filled 2018-06-27: qty 1

## 2018-06-27 MED ORDER — METHOCARBAMOL 500 MG IVPB - SIMPLE MED
500.0000 mg | Freq: Four times a day (QID) | INTRAVENOUS | Status: DC | PRN
  Administered 2018-06-27: 500 mg via INTRAVENOUS
  Filled 2018-06-27: qty 500
  Filled 2018-06-27: qty 50

## 2018-06-27 MED ORDER — POLYETHYLENE GLYCOL 3350 17 G PO PACK: 17.0000 g | PACK | Freq: Every day | ORAL | Status: DC | PRN

## 2018-06-27 MED ORDER — PANTOPRAZOLE SODIUM 40 MG PO TBEC
40.0000 mg | DELAYED_RELEASE_TABLET | Freq: Every day | ORAL | Status: DC
  Administered 2018-06-28 – 2018-06-29 (×2): 40 mg via ORAL
  Filled 2018-06-27 (×2): qty 1

## 2018-06-27 MED ORDER — TIZANIDINE HCL 2 MG PO TABS: 2.0000 mg | ORAL_TABLET | Freq: Four times a day (QID) | ORAL | 0 refills | Status: DC | PRN

## 2018-06-27 MED ORDER — CELECOXIB 200 MG PO CAPS
200.0000 mg | ORAL_CAPSULE | Freq: Two times a day (BID) | ORAL | Status: DC
  Administered 2018-06-27 – 2018-06-29 (×4): 200 mg via ORAL
  Filled 2018-06-27 (×4): qty 1

## 2018-06-27 MED ORDER — ONDANSETRON HCL 4 MG/2ML IJ SOLN: 4.0000 mg | Freq: Once | INTRAMUSCULAR | Status: DC | PRN

## 2018-06-27 MED ORDER — ACETAMINOPHEN 325 MG PO TABS: 325.0000 mg | ORAL_TABLET | ORAL | Status: DC | PRN

## 2018-06-27 MED ORDER — MIDAZOLAM HCL 5 MG/5ML IJ SOLN
INTRAMUSCULAR | Status: DC | PRN
  Administered 2018-06-27: 2 mg via INTRAVENOUS

## 2018-06-27 MED ORDER — PROPOFOL 10 MG/ML IV BOLUS
INTRAVENOUS | Status: DC | PRN
  Administered 2018-06-27: 20 mg via INTRAVENOUS

## 2018-06-27 MED ORDER — SODIUM CHLORIDE (PF) 0.9 % IJ SOLN
INTRAMUSCULAR | Status: AC
  Filled 2018-06-27: qty 50

## 2018-06-27 MED ORDER — ONDANSETRON HCL 4 MG/2ML IJ SOLN
INTRAMUSCULAR | Status: DC | PRN
  Administered 2018-06-27: 4 mg via INTRAVENOUS

## 2018-06-27 MED ORDER — GABAPENTIN 300 MG PO CAPS
300.0000 mg | ORAL_CAPSULE | Freq: Three times a day (TID) | ORAL | Status: DC
  Administered 2018-06-27 – 2018-06-29 (×5): 300 mg via ORAL
  Filled 2018-06-27 (×5): qty 1

## 2018-06-27 MED ORDER — DOCUSATE SODIUM 100 MG PO CAPS
100.0000 mg | ORAL_CAPSULE | Freq: Two times a day (BID) | ORAL | Status: DC
  Administered 2018-06-27 – 2018-06-29 (×4): 100 mg via ORAL
  Filled 2018-06-27 (×4): qty 1

## 2018-06-27 MED ORDER — FLEET ENEMA 7-19 GM/118ML RE ENEM: 1.0000 | ENEMA | Freq: Once | RECTAL | Status: DC | PRN

## 2018-06-27 MED ORDER — ONDANSETRON HCL 4 MG/2ML IJ SOLN
INTRAMUSCULAR | Status: AC
  Filled 2018-06-27: qty 2

## 2018-06-27 MED ORDER — MEPERIDINE HCL 50 MG/ML IJ SOLN: 6.2500 mg | INTRAMUSCULAR | Status: DC | PRN

## 2018-06-27 MED ORDER — PROPOFOL 500 MG/50ML IV EMUL
INTRAVENOUS | Status: DC | PRN
  Administered 2018-06-27: 50 ug/kg/min via INTRAVENOUS

## 2018-06-27 MED ORDER — MENTHOL 3 MG MT LOZG: 1.0000 | LOZENGE | OROMUCOSAL | Status: DC | PRN

## 2018-06-27 MED ORDER — ASPIRIN EC 81 MG PO TBEC: 81.0000 mg | DELAYED_RELEASE_TABLET | Freq: Two times a day (BID) | ORAL | 0 refills | Status: DC

## 2018-06-27 MED ORDER — BISACODYL 5 MG PO TBEC: 5.0000 mg | DELAYED_RELEASE_TABLET | Freq: Every day | ORAL | Status: DC | PRN

## 2018-06-27 MED ORDER — OXYCODONE HCL 5 MG/5ML PO SOLN: 5.0000 mg | Freq: Once | ORAL | Status: DC | PRN

## 2018-06-27 MED ORDER — VANCOMYCIN HCL IN DEXTROSE 1-5 GM/200ML-% IV SOLN
1000.0000 mg | INTRAVENOUS | Status: AC
  Administered 2018-06-27: 1000 mg via INTRAVENOUS
  Filled 2018-06-27: qty 200

## 2018-06-27 MED ORDER — METOCLOPRAMIDE HCL 5 MG PO TABS: 5.0000 mg | ORAL_TABLET | Freq: Three times a day (TID) | ORAL | Status: DC | PRN

## 2018-06-27 MED ORDER — POVIDONE-IODINE 10 % EX SWAB
2.0000 "application " | Freq: Once | CUTANEOUS | Status: AC
  Administered 2018-06-27: 2 via TOPICAL

## 2018-06-27 MED ORDER — ACETAMINOPHEN 325 MG PO TABS: 325.0000 mg | ORAL_TABLET | Freq: Four times a day (QID) | ORAL | Status: DC | PRN

## 2018-06-27 MED ORDER — SODIUM CHLORIDE 0.9% FLUSH
INTRAVENOUS | Status: DC | PRN
  Administered 2018-06-27: 50 mL

## 2018-06-27 MED ORDER — ACETAMINOPHEN 160 MG/5ML PO SOLN: 325.0000 mg | ORAL | Status: DC | PRN

## 2018-06-27 MED ORDER — ONDANSETRON HCL 4 MG/2ML IJ SOLN: 4.0000 mg | Freq: Four times a day (QID) | INTRAMUSCULAR | Status: DC | PRN

## 2018-06-27 MED ORDER — TRANEXAMIC ACID 1000 MG/10ML IV SOLN
INTRAVENOUS | Status: DC | PRN
  Administered 2018-06-27: 17:00:00 2000 mg via TOPICAL

## 2018-06-27 MED ORDER — CHLORHEXIDINE GLUCONATE 4 % EX LIQD: 60.0000 mL | Freq: Once | CUTANEOUS | Status: DC

## 2018-06-27 MED ORDER — METOCLOPRAMIDE HCL 5 MG/ML IJ SOLN: 5.0000 mg | Freq: Three times a day (TID) | INTRAMUSCULAR | Status: DC | PRN

## 2018-06-27 MED ORDER — METHOCARBAMOL 500 MG PO TABS
500.0000 mg | ORAL_TABLET | Freq: Four times a day (QID) | ORAL | Status: DC | PRN
  Filled 2018-06-27: qty 1

## 2018-06-27 MED ORDER — OXYCODONE HCL 5 MG PO TABS: 5.0000 mg | ORAL_TABLET | Freq: Once | ORAL | Status: DC | PRN

## 2018-06-27 MED ORDER — BUPIVACAINE-EPINEPHRINE (PF) 0.25% -1:200000 IJ SOLN
INTRAMUSCULAR | Status: AC
  Filled 2018-06-27: qty 30

## 2018-06-27 MED ORDER — PHENOL 1.4 % MT LIQD: 1.0000 | OROMUCOSAL | Status: DC | PRN

## 2018-06-27 MED ORDER — PHENYLEPHRINE HCL (PRESSORS) 10 MG/ML IV SOLN
INTRAVENOUS | Status: AC
  Filled 2018-06-27: qty 1

## 2018-06-27 MED ORDER — ONDANSETRON HCL 4 MG PO TABS: 4.0000 mg | ORAL_TABLET | Freq: Four times a day (QID) | ORAL | Status: DC | PRN

## 2018-06-27 MED ORDER — HYDROMORPHONE HCL 1 MG/ML IJ SOLN
0.5000 mg | INTRAMUSCULAR | Status: DC | PRN
  Filled 2018-06-27: qty 1

## 2018-06-27 MED ORDER — ACETAMINOPHEN 500 MG PO TABS
1000.0000 mg | ORAL_TABLET | Freq: Four times a day (QID) | ORAL | Status: AC
  Administered 2018-06-27 – 2018-06-28 (×3): 1000 mg via ORAL
  Filled 2018-06-27 (×3): qty 2

## 2018-06-27 MED ORDER — MIDAZOLAM HCL 2 MG/2ML IJ SOLN
INTRAMUSCULAR | Status: AC
  Filled 2018-06-27: qty 2

## 2018-06-27 MED ORDER — DIPHENHYDRAMINE HCL 12.5 MG/5ML PO ELIX: 12.5000 mg | ORAL_SOLUTION | ORAL | Status: DC | PRN

## 2018-06-27 MED ORDER — FENTANYL CITRATE (PF) 100 MCG/2ML IJ SOLN
INTRAMUSCULAR | Status: AC
  Filled 2018-06-27: qty 2

## 2018-06-27 MED ORDER — LACTATED RINGERS IV SOLN
INTRAVENOUS | Status: DC
  Administered 2018-06-27 (×2): via INTRAVENOUS

## 2018-06-27 MED ORDER — VITAMIN D 25 MCG (1000 UNIT) PO TABS
2000.0000 [IU] | ORAL_TABLET | Freq: Every day | ORAL | Status: DC
  Administered 2018-06-28 – 2018-06-29 (×2): 2000 [IU] via ORAL
  Filled 2018-06-27 (×2): qty 2

## 2018-06-27 MED ORDER — OXYCODONE-ACETAMINOPHEN 5-325 MG PO TABS: 1.0000 | ORAL_TABLET | ORAL | 0 refills | Status: DC | PRN

## 2018-06-27 MED ORDER — TRANEXAMIC ACID-NACL 1000-0.7 MG/100ML-% IV SOLN
1000.0000 mg | Freq: Once | INTRAVENOUS | Status: AC
  Administered 2018-06-27: 1000 mg via INTRAVENOUS
  Filled 2018-06-27: qty 100

## 2018-06-27 MED ORDER — SODIUM CHLORIDE 0.9 % IV SOLN
INTRAVENOUS | Status: DC | PRN
  Administered 2018-06-27: 50 ug/min via INTRAVENOUS

## 2018-06-27 MED ORDER — ALUMINUM HYDROXIDE GEL 320 MG/5ML PO SUSP
15.0000 mL | ORAL | Status: DC | PRN
  Filled 2018-06-27: qty 30

## 2018-06-27 MED ORDER — PROPOFOL 10 MG/ML IV BOLUS
INTRAVENOUS | Status: AC
  Filled 2018-06-27: qty 40

## 2018-06-27 MED ORDER — HYDROCHLOROTHIAZIDE 25 MG PO TABS
25.0000 mg | ORAL_TABLET | Freq: Every day | ORAL | Status: DC
  Administered 2018-06-28 – 2018-06-29 (×2): 25 mg via ORAL
  Filled 2018-06-27 (×2): qty 1

## 2018-06-27 SURGICAL SUPPLY — 50 items
BAG DECANTER FOR FLEXI CONT (MISCELLANEOUS) ×2 IMPLANT
BALL HIP CERAMIC (Hips) IMPLANT
BLADE SAW SGTL 18X1.27X75 (BLADE) ×2 IMPLANT
BLADE SURG SZ10 CARB STEEL (BLADE) ×4 IMPLANT
CHLORAPREP W/TINT 26 (MISCELLANEOUS) ×2 IMPLANT
CONT SPEC 4OZ CLIKSEAL STRL BL (MISCELLANEOUS) ×3 IMPLANT
COVER PERINEAL POST (MISCELLANEOUS) ×2 IMPLANT
COVER SURGICAL LIGHT HANDLE (MISCELLANEOUS) ×2 IMPLANT
COVER WAND RF STERILE (DRAPES) ×2 IMPLANT
CUP ACETBLR 48 OD 100 SERIES (Hips) ×1 IMPLANT
DECANTER SPIKE VIAL GLASS SM (MISCELLANEOUS) ×4 IMPLANT
DRAPE STERI IOBAN 125X83 (DRAPES) ×2 IMPLANT
DRAPE U-SHAPE 47X51 STRL (DRAPES) ×4 IMPLANT
DRESSING AQUACEL AG SP 3.5X10 (GAUZE/BANDAGES/DRESSINGS) IMPLANT
DRSG AQUACEL AG ADV 3.5X10 (GAUZE/BANDAGES/DRESSINGS) ×2 IMPLANT
DRSG AQUACEL AG SP 3.5X10 (GAUZE/BANDAGES/DRESSINGS) ×2
ELECT BLADE TIP CTD 4 INCH (ELECTRODE) ×2 IMPLANT
ELECT REM PT RETURN 15FT ADLT (MISCELLANEOUS) ×2 IMPLANT
ELIMINATOR HOLE APEX DEPUY (Hips) ×1 IMPLANT
GLOVE BIO SURGEON STRL SZ7.5 (GLOVE) ×2 IMPLANT
GLOVE BIO SURGEON STRL SZ8.5 (GLOVE) ×2 IMPLANT
GLOVE BIOGEL PI IND STRL 8 (GLOVE) ×1 IMPLANT
GLOVE BIOGEL PI IND STRL 9 (GLOVE) ×1 IMPLANT
GLOVE BIOGEL PI INDICATOR 8 (GLOVE) ×1
GLOVE BIOGEL PI INDICATOR 9 (GLOVE) ×1
GOWN STRL REUS W/TWL XL LVL3 (GOWN DISPOSABLE) ×4 IMPLANT
HIP BALL CERAMIC (Hips) ×2 IMPLANT
HOLDER FOLEY CATH W/STRAP (MISCELLANEOUS) ×2 IMPLANT
KIT TURNOVER KIT A (KITS) IMPLANT
MANIFOLD NEPTUNE II (INSTRUMENTS) ×2 IMPLANT
NDL HYPO 21X1.5 SAFETY (NEEDLE) ×2 IMPLANT
NEEDLE HYPO 21X1.5 SAFETY (NEEDLE) ×4 IMPLANT
NS IRRIG 1000ML POUR BTL (IV SOLUTION) ×2 IMPLANT
PACK ANTERIOR HIP CUSTOM (KITS) ×2 IMPLANT
PINN ALTRX NEUT ID X OD 32X48 ×1 IMPLANT
STEM FEM ACTIS HIGH SZ3 (Stem) ×1 IMPLANT
STRIP CLOSURE SKIN 1/2X4 (GAUZE/BANDAGES/DRESSINGS) ×1 IMPLANT
SUT ETHIBOND NAB CT1 #1 30IN (SUTURE) ×2 IMPLANT
SUT VIC AB 0 CT1 27 (SUTURE) ×1
SUT VIC AB 0 CT1 27XBRD ANBCTR (SUTURE) ×1 IMPLANT
SUT VIC AB 1 CTX 36 (SUTURE) ×1
SUT VIC AB 1 CTX36XBRD ANBCTR (SUTURE) ×1 IMPLANT
SUT VIC AB 2-0 CT1 27 (SUTURE) ×1
SUT VIC AB 2-0 CT1 TAPERPNT 27 (SUTURE) ×1 IMPLANT
SUT VIC AB 3-0 CT1 27 (SUTURE) ×1
SUT VIC AB 3-0 CT1 TAPERPNT 27 (SUTURE) ×1 IMPLANT
SYR CONTROL 10ML LL (SYRINGE) ×6 IMPLANT
TRAY FOLEY MTR SLVR 14FR STAT (SET/KITS/TRAYS/PACK) ×1 IMPLANT
TRAY FOLEY MTR SLVR 16FR STAT (SET/KITS/TRAYS/PACK) IMPLANT
YANKAUER SUCT BULB TIP 10FT TU (MISCELLANEOUS) ×2 IMPLANT

## 2018-06-27 NOTE — Discharge Instructions (Signed)

## 2018-06-27 NOTE — Progress Notes (Signed)
Unable to void for UA

## 2018-06-27 NOTE — Op Note (Signed)
OPERATIVE REPORT    DATE OF PROCEDURE:  06/27/2018       PREOPERATIVE DIAGNOSIS:  Left Hip Femoral Head Fracture and Collapse                                                          POSTOPERATIVE DIAGNOSIS:  Left Hip Femoral Head Fracture and Collapse                                                           PROCEDURE: Anterior L total hip arthroplasty using a 48 mm DePuy Pinnacle  Cup, [place] [employer] Hole Eliminator, 0-degree polyethylene liner, a +5 mm x 32mm ceramic head, a 3hi Depuy Actis stem   SURGEON: Nestor LewandowskyFrank J Shaliah Wann    ASSISTANT:   Tomi LikensEric K. [employer] PA-C  (present throughout entire procedure and necessary for timely completion of the procedure)   ANESTHESIA: Spinal BLOOD LOSS: 300 cc FLUID REPLACEMENT: 1600 cc crystalloid Antibiotic: 1gm Vanc Tranexamic Acid: 1gm IV, 2gm Topical Exparel: 133mg  COMPLICATIONS: none    INDICATIONS FOR PROCEDURE: A 63 y.o. year-old With  Left Hip Femoral Head Fracture and Collapse   for 1 years, x-rays show bone-on-bone arthritic changes, and osteophytes. Despite conservative measures with observation, anti-inflammatory medicine, narcotics, use of a cane, has severe unremitting pain and can ambulate only a few blocks before resting. Patient desires elective L total hip arthroplasty to decrease pain and increase function. The risks, benefits, and alternatives were discussed at length including but not limited to the risks of infection, bleeding, nerve injury, stiffness, blood clots, the need for revision surgery, cardiopulmonary complications, among others, and they were willing to proceed. Questions answered      PROCEDURE IN DETAIL: The patient was identified by armband,   [facility], taken to the operating room , appropriate anesthetic monitors   were attached and  anesthesia was induced with the patient on the gurney. The HANA boots were applied to the feet and the patient  was  transferred to the HANA table with a peroneal post and support underneath the non-operative leg, which was locked in 2 lb traction. Theoperative lower extremity was then prepped and draped in the usual sterile fashion from just above the iliac crest to the knee. And a timeout procedure was performed. We then made a 12 cm incision along the interval at the leading edge of the tensor fascia lata of starting at 2 cm lateral to the ASIS. Small bleeders in the skin and subcutaneous tissue identified and cauterized we dissected down to the fascia and made an incision in the fascia allowing us to elevate the fascia of the tensor muscle and exploited the interval between the rectus and the tensor fascia lata. A Cobra retractor was then placed along the superior neck of the femur. A cerebellar retractor was used to expose the interval between the tensor fascia lata and the rectus femoris. .  We identified and cauterized the ascending branch of the anterior circumflex artery. A second Cobra retractor along the inferior neck of the femur. A small Hohmann retractor  was placed underneath the origin of the rectus femoris, giving [place] good medial exposure. Using Ronguers fatty tissue was removed from in front of the anterior capsule. The capsule was then incised, starting out at the superior anterior aspect of the acetabulum going laterally along the anterior neck. The capsule was then teed along the neck superiorly and inferiorly. Electrocautery was used to release capsule from the anterior and medial neck of the femur to allow external rotation. Cobra retractors were then placed along the inferior and superior neck allowing [place] to perform a standard neck cut and removed the femoral head with a power corkscrew. We then placed a medium bent homan retractor in the cotyloid notch and posteriorly along the acetabular rim a narrow Cobra retractor. Exposed labral tissue was then removed with the electrocautery. We then sequentially reamed  up to a 47 mm basket reamer obtaining good coverage in all quadrants, verified by C-arm imaging. Under C-arm control we then hammered into place a 48 mm Pinnacle cup in 45 of abduction and 15 of anteversion. The cup seated nicely and required no supplemental screws. We then placed a central hole Eliminator and a 0 polyethylene liner. The foot was then externally rotated to 130-140. The limb was extended and adducted delivering the proximal femur up into the wound. A medium curved Hohmann retractor was placed over the greater trochanter and a long Homan retractor along the posterior femoral neck completing the exposure. We then performed releases superiorly and and inferiorly of the capsule going back to the pirformis fossa superiorly and to the lesser trochanter inferiorly. We then entered the proximal femur with the box cutting offset chisel followed by, a canal sounder, the chili pepper and broaching up to a 3 broach. This seated nicely and we reamed the calcar. A trial reduction was performed with a +5 mm 32 mm head.The limb lengths were excellent the hip was stable in 90 of external rotation. At this point the trial components removed and we hammered into place a # 3 hi  Offset Actis stem with Gryption coating. A +5 mm x 32 head was then hammered into place. The hip was reduced and final C-arm images obtained. The wound was thoroughly irrigated with normal saline solution. We repaired the ant capsule and the tensor fascia lot a with running 0 vicryl suture. the subcutaneous tissue was closed with 2-0 and 3-0 Vicryl suture followed by an Aquacil dressing. At this point the patient was awaken and transferred to hospital gurney without difficulty.   Kerin Salen 06/27/2018, 5:05 PM

## 2018-06-27 NOTE — Transfer of Care (Signed)
Immediate Anesthesia Transfer of Care Note  Patient: Elaine Allen  Procedure(s) Performed: TOTAL HIP ARTHROPLASTY ANTERIOR APPROACH (Left )  Patient Location: PACU  Anesthesia Type:MAC and Spinal  Level of Consciousness: awake, alert , oriented and patient cooperative  Airway & Oxygen Therapy: Patient Spontanous Breathing and Patient connected to face mask oxygen  Post-op Assessment: Report given to RN and Post -op Vital signs reviewed and stable  Post vital signs: Reviewed and stable  Last Vitals:  Vitals Value Taken Time  BP 114/76 06/27/18 1739  Temp    Pulse 91 06/27/18 1740  Resp 17 06/27/18 1740  SpO2 100 % 06/27/18 1740  Vitals shown include unvalidated device data.  Last Pain:  Vitals:   06/27/18 1405  TempSrc: Oral  PainSc: 7       Patients Stated Pain Goal: 4 (22/48/25 0037)  Complications: No apparent anesthesia complications

## 2018-06-27 NOTE — Anesthesia Procedure Notes (Signed)
Spinal  Patient location during procedure: OR Start time: 06/27/2018 3:40 PM End time: 06/27/2018 3:44 PM Staffing Anesthesiologist: Janeece Riggers, MD Preanesthetic Checklist Completed: patient identified, site marked, surgical consent, pre-op evaluation, timeout performed, IV checked, risks and benefits discussed and monitors and equipment checked Spinal Block Patient position: sitting Prep: DuraPrep Patient monitoring: heart rate, cardiac monitor, continuous pulse ox and blood pressure Approach: midline Location: L2-3 Injection technique: single-shot Needle Needle type: Sprotte  Needle gauge: 24 G Needle length: 9 cm Assessment Sensory level: T4

## 2018-06-28 ENCOUNTER — Encounter (HOSPITAL_COMMUNITY): Payer: Self-pay | Admitting: Orthopedic Surgery

## 2018-06-28 DIAGNOSIS — S72092A Other fracture of head and neck of left femur, initial encounter for closed fracture: Secondary | ICD-10-CM | POA: Diagnosis not present

## 2018-06-28 DIAGNOSIS — M1610 Unilateral primary osteoarthritis, unspecified hip: Secondary | ICD-10-CM | POA: Diagnosis not present

## 2018-06-28 DIAGNOSIS — E210 Primary hyperparathyroidism: Secondary | ICD-10-CM | POA: Diagnosis not present

## 2018-06-28 DIAGNOSIS — M7062 Trochanteric bursitis, left hip: Secondary | ICD-10-CM | POA: Diagnosis not present

## 2018-06-28 DIAGNOSIS — R7303 Prediabetes: Secondary | ICD-10-CM | POA: Diagnosis not present

## 2018-06-28 DIAGNOSIS — M879 Osteonecrosis, unspecified: Secondary | ICD-10-CM | POA: Diagnosis not present

## 2018-06-28 DIAGNOSIS — Z800 Family history of malignant neoplasm of digestive organs: Secondary | ICD-10-CM | POA: Diagnosis not present

## 2018-06-28 DIAGNOSIS — G4700 Insomnia, unspecified: Secondary | ICD-10-CM | POA: Diagnosis not present

## 2018-06-28 DIAGNOSIS — E0590 Thyrotoxicosis, unspecified without thyrotoxic crisis or storm: Secondary | ICD-10-CM | POA: Diagnosis not present

## 2018-06-28 LAB — BASIC METABOLIC PANEL
Anion gap: 8 (ref 5–15)
BUN: 36 mg/dL — ABNORMAL HIGH (ref 8–23)
CO2: 25 mmol/L (ref 22–32)
Calcium: 8.9 mg/dL (ref 8.9–10.3)
Chloride: 104 mmol/L (ref 98–111)
Creatinine, Ser: 1.09 mg/dL — ABNORMAL HIGH (ref 0.44–1.00)
GFR calc Af Amer: >60 mL/min (ref >60)
GFR calc non Af Amer: 54 mL/min — ABNORMAL LOW (ref >60)
Glucose, Bld: 185 mg/dL — ABNORMAL HIGH (ref 70–99)
Potassium: 3.9 mmol/L (ref 3.5–5.1)
Sodium: 137 mmol/L (ref 135–145)

## 2018-06-28 LAB — CBC
HCT: 33.9 % — ABNORMAL LOW (ref 36.0–46.0)
Hemoglobin: 10.4 g/dL — ABNORMAL LOW (ref 12.0–15.0)
MCH: 27.7 pg (ref 26.0–34.0)
MCHC: 30.7 g/dL (ref 30.0–36.0)
MCV: 90.2 fL (ref 80.0–100.0)
Platelets: 130 10*3/uL — ABNORMAL LOW (ref 150–400)
RBC: 3.76 MIL/uL — ABNORMAL LOW (ref 3.87–5.11)
RDW: 13.6 % (ref 11.5–15.5)
WBC: 4.7 10*3/uL (ref 4.0–10.5)
nRBC: 0 % (ref 0.0–0.2)

## 2018-06-28 LAB — URINE CULTURE: Culture: NO GROWTH

## 2018-06-28 NOTE — Evaluation (Signed)
Physical Therapy Evaluation Patient Details Name: Elaine Allen MRN: [id number] DOB: Nov 16, 1955 Today's Date: 06/28/2018   History of Present Illness  Pt is a 63 year old female admitted for L THA with hx of stroke, blind right eye, lupus  Clinical Impression  Pt is s/p THA resulting in the deficits listed below (see PT Problem List).  Pt will benefit from skilled PT to increase their independence and safety with mobility to allow discharge to the venue listed below.  Pt assisted with ambulating in hallway POD #1 and requiring increased time.  Pt has 6 steps to perform in home and has family able to assist upon d/c.     Follow Up Recommendations Home health PT;Follow surgeon's recommendation for DC plan and follow-up therapies;Supervision for mobility/OOB    Equipment Recommendations  None recommended by PT    Recommendations for Other Services       Precautions / Restrictions Precautions Precautions: Fall Restrictions Weight Bearing Restrictions: No Other Position/Activity Restrictions: WBAT      Mobility  Bed Mobility Overal bed mobility: Needs Assistance Bed Mobility: Supine to Sit     Supine to sit: Min assist;HOB elevated     General bed mobility comments: assist for L LE, increased time and effort  Transfers Overall transfer level: Needs assistance Equipment used: Rolling walker (2 wheeled) Transfers: Sit to/from Stand Sit to Stand: Min assist         General transfer comment: verbal cues for UE and LE positioning, assist to rise and steady  Ambulation/Gait Ambulation/Gait assistance: Min assist;Min guard Gait Distance (Feet): 80 Feet Assistive device: Rolling walker (2 wheeled) Gait Pattern/deviations: Step-to pattern;Decreased stance time - left;Antalgic Gait velocity: decr   General Gait Details: verbal cues for sequence, safety, RW positioning, step length, increased time required  Stairs            Wheelchair Mobility    Modified Rankin  (Stroke Patients Only)       Balance                                             Pertinent Vitals/Pain Pain Assessment: Faces Faces Pain Scale: Hurts little more Pain Location: L hip Pain Descriptors / Indicators: Sore;Other (Comment)("stiff") Pain Intervention(s): Limited activity within patient's tolerance;Monitored during session;Repositioned    Home Living Family/patient expects to be discharged to:: Private residence Living Arrangements: Other relatives Available Help at Discharge: Family;Available 24 hours/day Type of Home: House Home Access: Stairs to enter   [employer] of Steps: 2 Home Layout: Two level Home Equipment: Walker - 2 wheels      Prior Function Level of Independence: Independent               Hand Dominance        Extremity/Trunk Assessment        Lower Extremity Assessment Lower Extremity Assessment: LLE deficits/detail;Generalized weakness LLE Deficits / Details: anticipated L hip weakness post op       Communication   Communication: No difficulties  Cognition Arousal/Alertness: Awake/alert Behavior During Therapy: WFL for tasks assessed/performed Overall Cognitive Status: Within Functional Limits for tasks assessed                                        General Comments  Exercises     Assessment/Plan    PT Assessment Patient needs continued PT services  PT Problem List Decreased strength;Decreased mobility;Decreased activity tolerance;Decreased knowledge of use of DME;Decreased range of motion;Decreased coordination;Pain       PT Treatment Interventions Gait training;Therapeutic exercise;Stair training;Functional mobility training;DME instruction;Therapeutic activities;Patient/family education    PT Goals (Current goals can be found in the Care Plan section)  Acute Rehab PT Goals PT Goal Formulation: With patient Time For Goal Achievement: 07/02/18 Potential to  Achieve Goals: Good    Frequency 7X/week   Barriers to discharge        Co-evaluation               AM-PAC PT "6 Clicks" Mobility  Outcome Measure Help needed turning from your back to your side while in a flat bed without using bedrails?: A Little Help needed moving from lying on your back to sitting on the side of a flat bed without using bedrails?: A Little Help needed moving to and from a bed to a chair (including a wheelchair)?: A Little Help needed standing up from a chair using your arms (e.g., wheelchair or bedside chair)?: A Little Help needed to walk in hospital room?: A Little Help needed climbing 3-5 steps with a railing? : A Lot 6 Click Score: 17    End of Session Equipment Utilized During Treatment: Gait belt Activity Tolerance: Patient tolerated treatment well Patient left: in chair;with chair alarm set;with call bell/phone within reach Nurse Communication: Mobility status PT Visit Diagnosis: Other abnormalities of gait and mobility (R26.89)    Time: [id number] PT Time Calculation (min) (ACUTE ONLY): 27 min   Charges:   PT Evaluation $PT Eval Low Complexity: 1 Low PT Treatments $Gait Training: 8-22 mins       Zenovia JarredKati Anakin Varkey, PT, DPT Acute Rehabilitation Services Office: [phone] Pager: [phone]  Sarajane JewsLEMYRE,KATHrine E 06/28/2018, 11:24 AM

## 2018-06-28 NOTE — Progress Notes (Signed)
Physical Therapy Treatment Patient Details Name: Elaine Allen MRN: [id number] DOB: Feb 25, 1955 Today's Date: 06/28/2018    History of Present Illness Pt is a 63 year old female admitted for L THA with hx of stroke, blind right eye, lupus    PT Comments    Pt in bathroom on arrival.  Pt ambulated in hallway and slowly progressing with mobility.  Plan to practice steps tomorrow prior to d/c.  Pt educated on and performed LE exercises.  Pt would benefit from another PT session tomorrow.   Follow Up Recommendations  Home health PT;Follow surgeon's recommendation for DC plan and follow-up therapies;Supervision for mobility/OOB     Equipment Recommendations  None recommended by PT    Recommendations for Other Services       Precautions / Restrictions Precautions Precautions: Fall Restrictions Other Position/Activity Restrictions: WBAT    Mobility  Bed Mobility                  Transfers Overall transfer level: Needs assistance Equipment used: Rolling walker (2 wheeled) Transfers: Sit to/from Stand Sit to Stand: Min guard         General transfer comment: verbal cues for UE and LE positioning  Ambulation/Gait Ambulation/Gait assistance: Min guard Gait Distance (Feet): 100 Feet Assistive device: Rolling walker (2 wheeled) Gait Pattern/deviations: Step-to pattern;Decreased stance time - left;Antalgic     General Gait Details: verbal cues for sequence, safety, RW positioning, step length, increased time required however improved pace this afternoon   Stairs             Wheelchair Mobility    Modified Rankin (Stroke Patients Only)       Balance                                            Cognition Arousal/Alertness: Awake/alert Behavior During Therapy: WFL for tasks assessed/performed Overall Cognitive Status: Within Functional Limits for tasks assessed                                        Exercises Total  Joint Exercises Ankle Circles/Pumps: AROM;10 reps;Both Quad Sets: AROM;10 reps;Both Heel Slides: AAROM;Left;10 reps Hip ABduction/ADduction: AROM;10 reps;Left;Standing;Supine Long Arc Quad: Left;Seated;10 reps;AROM Knee Flexion: AROM;Left;10 reps;Standing Marching in Standing: AROM;Left;10 reps;Standing Standing Hip Extension: Left;10 reps;AROM    General Comments        Pertinent Vitals/Pain Pain Assessment: 0-10 Pain Score: 4  Pain Location: L hip Pain Descriptors / Indicators: Sore Pain Intervention(s): Monitored during session;Repositioned    Home Living                      Prior Function            PT Goals (current goals can now be found in the care plan section) Progress towards PT goals: Progressing toward goals    Frequency    7X/week      PT Plan Current plan remains appropriate    Co-evaluation              AM-PAC PT "6 Clicks" Mobility   Outcome Measure  Help needed turning from your back to your side while in a flat bed without using bedrails?: A Little Help needed moving from lying on your back to sitting on  the side of a flat bed without using bedrails?: A Little Help needed moving to and from a bed to a chair (including a wheelchair)?: A Little Help needed standing up from a chair using your arms (e.g., wheelchair or bedside chair)?: A Little Help needed to walk in hospital room?: A Little Help needed climbing 3-5 steps with a railing? : A Lot 6 Click Score: 17    End of Session   Activity Tolerance: Patient tolerated treatment well Patient left: in chair;with chair alarm set;with call bell/phone within reach Nurse Communication: Mobility status PT Visit Diagnosis: Other abnormalities of gait and mobility (R26.89)     Time: 1437-1500 PT Time Calculation (min) (ACUTE ONLY): 23 min  Charges:  $Gait Training: 8-22 mins $Therapeutic Exercise: 8-22 mins                    Zenovia JarredKati Liyla Radliff, PT, DPT Acute Rehabilitation  Services Office: [phone] Pager: [phone]  Sarajane JewsLEMYRE,KATHrine E 06/28/2018, 3:26 PM

## 2018-06-28 NOTE — TOC Transition Note (Signed)
Transition of Care [facility]) - CM/SW Discharge Note   Patient Details  Name: Elaine Allen MRN: [id number] Date of Birth: 1955/02/07  Transition of Care [facility]) CM/SW Contact:  Lia Hopping, [place] Phone Number: 06/28/2018, 10:04 AM   Clinical Narrative:    [place],  DME: Walker and 3 in 1   Final next level of care: [place] Barriers to Discharge: No Barriers Identified   Patient Goals and CMS Choice        Discharge Placement                       Discharge Plan and Services                DME Arranged: (Pre delievered 3 in 1 and Walker)           [place]: [facility]Prearranged [facility])        Social Determinants of Health ([place]) Interventions     Readmission Risk Interventions No flowsheet data found.

## 2018-06-28 NOTE — Anesthesia Postprocedure Evaluation (Signed)
Anesthesia Post Note  Patient: Elaine Allen  Procedure(s) Performed: TOTAL HIP ARTHROPLASTY ANTERIOR APPROACH (Left )     Patient location during evaluation: PACU Anesthesia Type: Spinal Level of consciousness: oriented and awake and alert Pain management: pain level controlled Vital Signs Assessment: post-procedure vital signs reviewed and stable Respiratory status: spontaneous breathing, respiratory function stable and patient connected to nasal cannula oxygen Cardiovascular status: blood pressure returned to baseline and stable Postop Assessment: no headache, no backache and no apparent nausea or vomiting Anesthetic complications: no    Last Vitals:  Vitals:   06/27/18 2102 06/28/18 0138  BP: (!) 142/91 (!) 154/87  Pulse: 94 79  Resp: 18 19  Temp: 36.7 C 37.1 C  SpO2: 91% 100%    Last Pain:  Vitals:   06/28/18 0138  TempSrc: Oral  PainSc:                  Karyl Kinnier Kona Yusuf

## 2018-06-28 NOTE — Telephone Encounter (Signed)
Spoke with [name] from [employer]. Told her I sent form to PCP and supplied [name] with PCP number. Nothing else to do. Thanks.

## 2018-06-28 NOTE — Progress Notes (Signed)
PATIENT ID: Elaine Allen  MRN: [id number]  DOB/AGE:  [birth year] / 63 y.o.  1 Day Post-Op Procedure(s) (LRB): TOTAL HIP ARTHROPLASTY ANTERIOR APPROACH (Left)    PROGRESS NOTE Subjective: Patient is alert, oriented, no Nausea, no Vomiting, yes passing gas, . Taking PO well. Denies SOB, Chest or Calf Pain. Using Incentive Spirometer, PAS in place. Ambulate o' Patient reports pain as  1/10  .    Objective: Vital signs in last 24 hours: Vitals:   06/27/18 2059 06/27/18 2102 06/28/18 0138 06/28/18 0515  BP: (Abnormal) 143/91 (Abnormal) 142/91 (Abnormal) 154/87 140/84  Pulse: 87 94 79 66  Resp: 19 18 19 18   Temp: 98.2 F (36.8 C) 98 F (36.7 C) 98.7 F (37.1 C)   TempSrc: Oral Oral Oral   SpO2: 100% 91% 100% 100%  Weight:      Height:          Intake/Output from previous day: I/O last 3 completed shifts: In: 2951.2 [P.O.:600; I.V.:2201.2; IV Piggyback:150] Out: 1751 [Urine:965; Blood:200]   Intake/Output this shift: No intake/output data recorded.   LABORATORY DATA: Recent Labs    06/28/18 0317  WBC 4.7  HGB 10.4*  HCT 33.9*  PLT 130*  NA 137  K 3.9  CL 104  CO2 25  BUN 36*  CREATININE 1.09*  GLUCOSE 185*  CALCIUM 8.9    Examination: Neurologically intact ABD soft Neurovascular intact Sensation intact distally Intact pulses distally Dorsiflexion/Plantar flexion intact Incision: dressing C/D/I No cellulitis present Compartment soft} XR AP&Lat of hip shows well placed\fixed THA  Assessment:   1 Day Post-Op Procedure(s) (LRB): TOTAL HIP ARTHROPLASTY ANTERIOR APPROACH (Left) ADDITIONAL DIAGNOSIS:  Expected Acute Blood Loss Anemia, Lupus, AVN  Patient's anticipated LOS is less than 2 midnights, meeting these requirements: - Younger than 19 - Lives within 1 hour of care - Has a competent adult at home to recover with post-op recover - NO history of  - Chronic pain requiring opiods  - Diabetes  - Coronary Artery Disease  - Heart failure  - Heart  attack  - Stroke  - DVT/VTE  - Cardiac arrhythmia  - Respiratory Failure/COPD  - Renal failure  - Anemia  - Advanced Liver disease       Plan: PT/OT WBAT, THA  DVT Prophylaxis: SCDx72 hrs, ASA 81 mg BID x 2 weeks  DISCHARGE PLAN: Home, today if passes PT  DISCHARGE NEEDS: HHPT, Walker and 3-in-1 comode seat

## 2018-06-29 DIAGNOSIS — G4700 Insomnia, unspecified: Secondary | ICD-10-CM | POA: Diagnosis not present

## 2018-06-29 DIAGNOSIS — M879 Osteonecrosis, unspecified: Secondary | ICD-10-CM | POA: Diagnosis not present

## 2018-06-29 DIAGNOSIS — M1610 Unilateral primary osteoarthritis, unspecified hip: Secondary | ICD-10-CM | POA: Diagnosis not present

## 2018-06-29 DIAGNOSIS — E210 Primary hyperparathyroidism: Secondary | ICD-10-CM | POA: Diagnosis not present

## 2018-06-29 DIAGNOSIS — E0590 Thyrotoxicosis, unspecified without thyrotoxic crisis or storm: Secondary | ICD-10-CM | POA: Diagnosis not present

## 2018-06-29 DIAGNOSIS — M7062 Trochanteric bursitis, left hip: Secondary | ICD-10-CM | POA: Diagnosis not present

## 2018-06-29 DIAGNOSIS — S72092A Other fracture of head and neck of left femur, initial encounter for closed fracture: Secondary | ICD-10-CM | POA: Diagnosis not present

## 2018-06-29 DIAGNOSIS — R7303 Prediabetes: Secondary | ICD-10-CM | POA: Diagnosis not present

## 2018-06-29 DIAGNOSIS — Z800 Family history of malignant neoplasm of digestive organs: Secondary | ICD-10-CM | POA: Diagnosis not present

## 2018-06-29 LAB — CBC
HCT: 33.5 % — ABNORMAL LOW (ref 36.0–46.0)
Hemoglobin: 10.5 g/dL — ABNORMAL LOW (ref 12.0–15.0)
MCH: 28.1 pg (ref 26.0–34.0)
MCHC: 31.3 g/dL (ref 30.0–36.0)
MCV: 89.6 fL (ref 80.0–100.0)
Platelets: 115 10*3/uL — ABNORMAL LOW (ref 150–400)
RBC: 3.74 MIL/uL — ABNORMAL LOW (ref 3.87–5.11)
RDW: 13.5 % (ref 11.5–15.5)
WBC: 6 10*3/uL (ref 4.0–10.5)
nRBC: 0 % (ref 0.0–0.2)

## 2018-06-29 NOTE — Progress Notes (Signed)
PATIENT ID: Elaine Allen  MRN: [id number]  DOB/AGE:  Jul 07, 1955 / 63 y.o.  2 Days Post-Op Procedure(s) (LRB): TOTAL HIP ARTHROPLASTY ANTERIOR APPROACH (Left)    PROGRESS NOTE Subjective: Patient is alert, oriented, no Nausea, on Vomiting, yes passing gas, . Taking PO well with pt in bed eating lunch. Denies SOB, Chest or Calf Pain. Using Incentive Spirometer, PAS in place. Ambulate WBAT with pt passing therapy goals Patient reports pain as  Mild to moderate .    Objective: Vital signs in last 24 hours: Vitals:   06/28/18 0945 06/28/18 1327 06/28/18 2234 06/29/18 0507  BP: 129/80 126/65 (!) 148/95 (!) 153/96  Pulse: 67 77 92 74  Resp: 14 15 16 16   Temp: (!) 97.5 F (36.4 C) (!) 97.5 F (36.4 C) 97.9 F (36.6 C) 97.7 F (36.5 C)  TempSrc: Oral Oral Oral Oral  SpO2: 100% 97% 100% 100%  Weight:      Height:          Intake/Output from previous day: I/O last 3 completed shifts: In: 3747.5 [P.O.:1560; I.V.:2037.5; IV Piggyback:150] Out: 2365 [Urine:2365]   Intake/Output this shift: Total I/O In: 240 [P.O.:240] Out: -    LABORATORY DATA: Recent Labs    06/28/18 0317 06/29/18 0309  WBC 4.7 6.0  HGB 10.4* 10.5*  HCT 33.9* 33.5*  PLT 130* 115*  NA 137  --   K 3.9  --   CL 104  --   CO2 25  --   BUN 36*  --   CREATININE 1.09*  --   GLUCOSE 185*  --   CALCIUM 8.9  --     Examination: Neurologically intact Neurovascular intact Sensation intact distally Intact pulses distally Dorsiflexion/Plantar flexion intact Incision: dressing C/D/I No cellulitis present Compartment soft} XR AP&Lat of hip shows well placed\fixed THA  Assessment:   2 Days Post-Op Procedure(s) (LRB): TOTAL HIP ARTHROPLASTY ANTERIOR APPROACH (Left) ADDITIONAL DIAGNOSIS:  Expected Acute Blood Loss Anemia, Lupus, AVN  Patient's anticipated LOS is less than 2 midnights, meeting these requirements: - Younger than 53 - Lives within 1 hour of care - Has a competent adult at home to recover  with post-op recover - NO history of  - Chronic pain requiring opiods  - Diabetes  - Coronary Artery Disease  - Heart failure  - Heart attack  - Stroke  - DVT/VTE  - Cardiac arrhythmia  - Respiratory Failure/COPD  - Renal failure  - Anemia  - Advanced Liver disease       Plan: PT/OT WBAT, THA  DVT Prophylaxis: SCDx72 hrs, ASA 81 mg BID x 2 weeks  DISCHARGE PLAN: Home  DISCHARGE NEEDS: HHPT, Walker and 3-in-1 comode seat

## 2018-06-29 NOTE — Plan of Care (Signed)
Patient discharged home in stable condition. She is waiting on her ride 

## 2018-06-29 NOTE — Discharge Summary (Signed)
Patient ID: Elaine Allen MRN: [id number] DOB/AGE: [birth year] 63 y.o.  Admit date: 06/27/2018 Discharge date: 06/29/2018  Admission Diagnoses:  Principal Problem:   AVN of femur [facility](HCC)   Discharge Diagnoses:  Same  Past Medical History:  Diagnosis Date  . Arthritis   . Blind right eye    sees colors  . Chronic anemia   . Detached retina, right    corrected good for about 6 months sees peripheral colors only  . History of kidney stones   . Hypertension   . Hyperthyroidism   . Lupus (HCC)   . Pneumonia   . Retinal detachment   . Spasmodic dysphonia   . Stroke (HCC) 2015   no permanant defecits had weakness in left arm in hospital Had PT also  . Vertigo    Hx of preceding stroke    Surgeries: Procedure(s): TOTAL HIP ARTHROPLASTY ANTERIOR APPROACH on 06/27/2018   Consultants:   Discharged Condition: Improved  Hospital Course: Elaine MaltaJill L Allen is an 63 y.o. female who was admitted 06/27/2018 for operative treatment ofAVN of femur (HCC). Patient has severe unremitting pain that affects sleep, daily activities, and work/hobbies. After pre-op clearance the patient was taken to the operating room on 06/27/2018 and underwent  Procedure(s): TOTAL HIP ARTHROPLASTY ANTERIOR APPROACH.    Patient was given perioperative antibiotics:  Anti-infectives (From admission, onward)   Start     Dose/Rate Route Frequency Ordered Stop   06/27/18 1400  vancomycin (VANCOCIN) IVPB 1000 mg/200 mL premix     1,000 mg 200 mL/hr over 60 Minutes Intravenous On call to O.R. 06/27/18 1345 06/27/18 1545       Patient was given sequential compression devices, early ambulation, and chemoprophylaxis to prevent DVT.  Patient benefited maximally from hospital stay and there were no complications.    Recent vital signs:  Patient Vitals for the past 24 hrs:  BP Temp Temp src Pulse Resp SpO2  06/29/18 0507 (!) 153/96 97.7 F (36.5 C) Oral 74 16 100 %  06/28/18 2234 (!) 148/95 97.9 F (36.6 C) Oral 92  16 100 %  06/28/18 1327 126/65 (!) 97.5 F (36.4 C) Oral 77 15 97 %     Recent laboratory studies:  Recent Labs    06/28/18 0317 06/29/18 0309  WBC 4.7 6.0  HGB 10.4* 10.5*  HCT 33.9* 33.5*  PLT 130* 115*  NA 137  --   K 3.9  --   CL 104  --   CO2 25  --   BUN 36*  --   CREATININE 1.09*  --   GLUCOSE 185*  --   CALCIUM 8.9  --      Discharge Medications:   Allergies as of 06/29/2018      Reactions   Plaquenil [hydroxychloroquine Sulfate] Anaphylaxis, Other (See Comments)   Blisters all over body   Azithromycin Nausea And Vomiting   Lactose Intolerance (gi) Other (See Comments)   Upset stomach   Penicillin G Itching, Nausea Only   Did it involve swelling of the face/tongue/throat, SOB, or low BP? No No Did it involve sudden or severe rash/hives, skin peeling, or any reaction on the inside of your mouth or nose? Np No Did you need to seek medical attention at a hospital or doctor's office? Yes Yes When did it last happen?around 2010 If all above answers are "NO", may proceed with cephalosporin use.      Medication List    STOP taking these medications   eszopiclone 2 MG  Tabs tablet Commonly known as: Lunesta   ibuprofen 200 MG tablet Commonly known as: ADVIL     TAKE these medications   aspirin EC 81 MG tablet Take 1 tablet (81 mg total) by mouth 2 (two) times daily.   cholecalciferol 25 MCG (1000 UT) tablet Commonly known as: VITAMIN D Take 2,000 Units by mouth daily.   losartan-hydrochlorothiazide 100-25 MG tablet Commonly known as: HYZAAR Take 1 tablet by mouth daily.   oxyCODONE-acetaminophen 5-325 MG tablet Commonly known as: PERCOCET/ROXICET Take 1 tablet by mouth every 4 (four) hours as needed for severe pain.   tiZANidine 2 MG tablet Commonly known as: ZANAFLEX Take 1 tablet (2 mg total) by mouth every 6 (six) hours as needed.            Durable Medical Equipment  (From admission, onward)         Start     Ordered   06/27/18 1959   DME Walker rolling  Once    Question:  Patient needs a walker to treat with the following condition  Answer:  Status post total hip replacement, left   06/27/18 1958   06/27/18 1959  DME 3 n 1  Once     06/27/18 1958           Discharge Care Instructions  (From admission, onward)         Start     Ordered   06/29/18 0000  Weight bearing as tolerated     06/29/18 1204          Diagnostic Studies: Dg Chest 2 View  Result Date: 06/24/2018 CLINICAL DATA:  Preoperative study.  No chest complaints. EXAM: CHEST - 2 VIEW COMPARISON:  Chest x-ray dated February 09, 2018. FINDINGS: The heart size and mediastinal contours are within normal limits. Both lungs are clear. The visualized skeletal structures are unremarkable. IMPRESSION: No active cardiopulmonary disease. Electronically Signed   By: Titus Dubin M.D.   On: 06/24/2018 10:07   Dg C-arm 1-60 Min-no Report  Result Date: 06/27/2018 Fluoroscopy was utilized by the requesting physician.  No radiographic interpretation.   Dg Hip Operative Unilat With Pelvis Left  Result Date: 06/27/2018 CLINICAL DATA:  Left hip AVN EXAM: OPERATIVE LEFT HIP (WITH PELVIS IF PERFORMED) 2 VIEWS TECHNIQUE: Fluoroscopic spot image(s) were submitted for interpretation post-operatively. COMPARISON:  04/29/2018 FINDINGS: Spot fluoroscopic views demonstrate bipolar left hip arthroplasty. Components appear aligned in the frontal plane. Expected postoperative findings. No acute osseous finding or hardware abnormality. IMPRESSION: Expected findings status post left hip arthroplasty. Electronically Signed   By: Jerilynn Mages.  Shick M.D.   On: 06/27/2018 17:33    Disposition: Discharge disposition: 01-Home or Self Care       Discharge Instructions    Call MD / Call 911   Complete by: As directed    If you experience chest pain or shortness of breath, CALL 911 and be transported to the hospital emergency room.  If you develope a fever above 101 F, pus (white drainage)  or increased drainage or redness at the wound, or calf pain, call your surgeon's office.   Constipation Prevention   Complete by: As directed    Drink plenty of fluids.  Prune juice may be helpful.  You may use a stool softener, such as Colace (over the counter) 100 mg twice a day.  Use MiraLax (over the counter) for constipation as needed.   Diet - low sodium heart healthy   Complete by: As directed  Driving restrictions   Complete by: As directed    No driving for 2 weeks   Increase activity slowly as tolerated   Complete by: As directed    Patient may shower   Complete by: As directed    You may shower without a dressing once there is no drainage.  Do not wash over the wound.  If drainage remains, cover wound with plastic wrap and then shower.   Weight bearing as tolerated   Complete by: As directed       Follow-up Information    Gean Birchwoodowan, Frank, MD In 2 weeks.   Specialty: Orthopedic Surgery Contact information: [address] [place] [address] [phone]        [facility] Follow up.   Specialty: Home Health Services Why: Representative will contact you.  Contact information: [address] [place] [address] [phone]            Signed: Dannielle Burnric Pearley Baranek 06/29/2018, 12:04 PM

## 2018-06-29 NOTE — Progress Notes (Signed)
Initial Nutrition Assessment  RD working remotely.   DOCUMENTATION CODES:   Not applicable  INTERVENTION:  - continue to encourage PO intakes. - RD will continue to monitor for additional nutrition-related needs.    NUTRITION DIAGNOSIS:   Increased nutrient needs related to post-op healing as evidenced by estimated needs.  GOAL:   Patient will meet greater than or equal to 90% of their needs  MONITOR:   PO intake, Labs, Weight trends, Skin  REASON FOR ASSESSMENT:   Malnutrition Screening Tool  ASSESSMENT:   63 year-old female with a history of pain and functional disability in the left hip d/t AVN and patient has failed non-surgical conservative treatments for >12 weeks to include NSAID's and/or analgesics, use of assistive devices and activity modification. Onset of symptoms was abrupt starting 1 year ago with rapid worsening since that time. She has night pain, worsening of pain with activity and weight bearing, trendelenberg gait, pain that interfers with ADLs and pain with passive range of motion. Patient has evidence of joint space narrowing and AVN by imaging studies.This condition presents safety issues increasing the risk of falls.  Per RN flow sheet, patient consumed 100% of all meals yesterday (6/23). Patient is POD #2 L total hip arthroplasty. PT evaluated patient yesterday afternoon and recommended Home Health PT. Ortho note from yesterday stated safe for d/c following PT assessment and recommendations.   Per chart review, current weight is 172 and weight on 4/24 was 183 lb. This indicates 11 lb weight loss (6% body weight) in the past 2 months; not significant for time frame. Patient reported that appetite and intakes fluctuate dependent on severity of pain.    Medications reviewed; 2000 units vitamin D/day, 100 mg colace BID. Labs reviewed; BUN: 36 mg/dl, creatinine: 1.09 mg/dl. IVF; D5-1/2 NS-20 mEq IV KCl @ 125 ml/hr (510 kcal).     NUTRITION - FOCUSED  PHYSICAL EXAM:  unable to complete at this time.   Diet Order:   Diet Order            Diet regular Room service appropriate? Yes; Fluid consistency: Thin  Diet effective now              EDUCATION NEEDS:   No education needs have been identified at this time  Skin:  Skin Assessment: Skin Integrity Issues: Skin Integrity Issues:: Incisions Incisions: L hip (6/22)  Last BM:  6/21  Height:   Ht Readings from Last 1 Encounters:  06/27/18 5\' 4"  (1.626 m)    Weight:   Wt Readings from Last 1 Encounters:  06/27/18 78.2 kg    Ideal Body Weight:  54.5 kg  BMI:  Body mass index is 29.58 kg/m.  Estimated Nutritional Needs:   Kcal:  2637-8588 kcal  Protein:  85-100 grams  Fluid:  >/= 2 L/day      Jarome Matin, MS, RD, LDN, [facility] Inpatient Clinical Dietitian Pager # [phone] After hours/weekend pager # [phone]

## 2018-06-29 NOTE — Progress Notes (Signed)
Physical Therapy Treatment Patient Details Name: Elaine MaltaJill L Allen MRN: [id number] DOB: 13-Sep-1955 Today's Date: 06/29/2018    History of Present Illness Pt is a 63 year old female admitted for L THA with hx of stroke, blind right eye, lupus    PT Comments    Pt ambulated in hallway and practiced safe stair technique.  Pt slow with mobility however performs with only min/guard assist.  Pt performed supine exercises and verbally reviewed standing exercises (also PT demonstrated safety and standing exercises).  Pt provided with HEP handout.  Pt feels ready for d/c home today.   Follow Up Recommendations  Home health PT;Follow surgeon's recommendation for DC plan and follow-up therapies;Supervision for mobility/OOB     Equipment Recommendations  None recommended by PT    Recommendations for Other Services       Precautions / Restrictions Precautions Precautions: Fall Restrictions Weight Bearing Restrictions: No Other Position/Activity Restrictions: WBAT    Mobility  Bed Mobility               General bed mobility comments: pt up in recliner on arrival  Transfers Overall transfer level: Needs assistance Equipment used: Rolling walker (2 wheeled) Transfers: Sit to/from Stand Sit to Stand: Min guard;Supervision         General transfer comment: verbal cues for UE and LE positioning  Ambulation/Gait Ambulation/Gait assistance: Min guard;Supervision Gait Distance (Feet): 100 Feet Assistive device: Rolling walker (2 wheeled) Gait Pattern/deviations: Step-to pattern;Decreased stance time - left;Antalgic Gait velocity: decr   General Gait Details: verbal cues for sequence, safety, RW positioning, step length, increased time required   Stairs Stairs: Yes Stairs assistance: Min guard Stair Management: Step to pattern;Forwards;One rail Right;With cane Number of Stairs: 3 General stair comments: verbal cues for sequence, safety, technique; pt performed  well   Wheelchair Mobility    Modified Rankin (Stroke Patients Only)       Balance                                            Cognition Arousal/Alertness: Awake/alert Behavior During Therapy: WFL for tasks assessed/performed Overall Cognitive Status: Within Functional Limits for tasks assessed                                        Exercises Total Joint Exercises Ankle Circles/Pumps: AROM;10 reps;Both Quad Sets: AROM;10 reps;Both Short Arc Quad: AROM;10 reps;Left Heel Slides: AAROM;Left;10 reps Hip ABduction/ADduction: AROM;10 reps;Left;Supine    General Comments        Pertinent Vitals/Pain Pain Assessment: 0-10 Pain Score: 4  Pain Location: L hip Pain Descriptors / Indicators: Sore Pain Intervention(s): Repositioned;Monitored during session;Premedicated before session;Limited activity within patient's tolerance;Ice applied    Home Living                      Prior Function            PT Goals (current goals can now be found in the care plan section) Progress towards PT goals: Progressing toward goals    Frequency    7X/week      PT Plan Current plan remains appropriate    Co-evaluation              AM-PAC PT "6 Clicks" Mobility   Outcome Measure  Help needed turning from your back to your side while in a flat bed without using bedrails?: A Little Help needed moving from lying on your back to sitting on the side of a flat bed without using bedrails?: A Little Help needed moving to and from a bed to a chair (including a wheelchair)?: A Little Help needed standing up from a chair using your arms (e.g., wheelchair or bedside chair)?: A Little Help needed to walk in hospital room?: A Little Help needed climbing 3-5 steps with a railing? : A Little 6 Click Score: 18    End of Session Equipment Utilized During Treatment: Gait belt Activity Tolerance: Patient tolerated treatment well Patient left: in  chair;with call bell/phone within reach Nurse Communication: Mobility status PT Visit Diagnosis: Other abnormalities of gait and mobility (R26.89)     Time: 6314-9702 PT Time Calculation (min) (ACUTE ONLY): 32 min  Charges:  $Gait Training: 23-37 mins                     Carmelia Bake, PT, DPT Acute Rehabilitation Services Office: [phone] Pager: [phone]  Trena Platt 06/29/2018, 12:01 PM

## 2018-06-30 ENCOUNTER — Telehealth: Payer: Self-pay | Admitting: *Deleted

## 2018-06-30 NOTE — Telephone Encounter (Signed)
Pt was on TCM report admitted 06/27/18 for operative treatment of AVN of femur ([place]). Patient has severe unremitting pain that affects sleep, daily activities, and work/hobbies. After pre-clearance pt underwent TOTAL HIP ARTHROPLASTY ANTERIOR APPROACH. Procedure went well pt d/C 06/29/18,and will follow=up w/surgeon in 2 weeks.Marland KitchenJohny Chess

## 2018-07-02 DIAGNOSIS — E039 Hypothyroidism, unspecified: Secondary | ICD-10-CM | POA: Diagnosis not present

## 2018-07-02 DIAGNOSIS — Z471 Aftercare following joint replacement surgery: Secondary | ICD-10-CM | POA: Diagnosis not present

## 2018-07-02 DIAGNOSIS — G4700 Insomnia, unspecified: Secondary | ICD-10-CM | POA: Diagnosis not present

## 2018-07-02 DIAGNOSIS — D649 Anemia, unspecified: Secondary | ICD-10-CM | POA: Diagnosis not present

## 2018-07-02 DIAGNOSIS — E785 Hyperlipidemia, unspecified: Secondary | ICD-10-CM | POA: Diagnosis not present

## 2018-07-02 DIAGNOSIS — I10 Essential (primary) hypertension: Secondary | ICD-10-CM | POA: Diagnosis not present

## 2018-07-02 DIAGNOSIS — Z8673 Personal history of transient ischemic attack (TIA), and cerebral infarction without residual deficits: Secondary | ICD-10-CM | POA: Diagnosis not present

## 2018-07-02 DIAGNOSIS — M329 Systemic lupus erythematosus, unspecified: Secondary | ICD-10-CM | POA: Diagnosis not present

## 2018-07-02 DIAGNOSIS — Z96642 Presence of left artificial hip joint: Secondary | ICD-10-CM | POA: Diagnosis not present

## 2018-07-04 ENCOUNTER — Encounter: Payer: Self-pay | Admitting: Internal Medicine

## 2018-07-04 DIAGNOSIS — M329 Systemic lupus erythematosus, unspecified: Secondary | ICD-10-CM

## 2018-07-05 DIAGNOSIS — G4700 Insomnia, unspecified: Secondary | ICD-10-CM | POA: Diagnosis not present

## 2018-07-05 DIAGNOSIS — Z8673 Personal history of transient ischemic attack (TIA), and cerebral infarction without residual deficits: Secondary | ICD-10-CM | POA: Diagnosis not present

## 2018-07-05 DIAGNOSIS — Z471 Aftercare following joint replacement surgery: Secondary | ICD-10-CM | POA: Diagnosis not present

## 2018-07-05 DIAGNOSIS — E039 Hypothyroidism, unspecified: Secondary | ICD-10-CM | POA: Diagnosis not present

## 2018-07-05 DIAGNOSIS — E785 Hyperlipidemia, unspecified: Secondary | ICD-10-CM | POA: Diagnosis not present

## 2018-07-05 DIAGNOSIS — I10 Essential (primary) hypertension: Secondary | ICD-10-CM | POA: Diagnosis not present

## 2018-07-05 DIAGNOSIS — D649 Anemia, unspecified: Secondary | ICD-10-CM | POA: Diagnosis not present

## 2018-07-05 DIAGNOSIS — Z96642 Presence of left artificial hip joint: Secondary | ICD-10-CM | POA: Diagnosis not present

## 2018-07-05 DIAGNOSIS — M329 Systemic lupus erythematosus, unspecified: Secondary | ICD-10-CM | POA: Diagnosis not present

## 2018-07-07 DIAGNOSIS — Z8673 Personal history of transient ischemic attack (TIA), and cerebral infarction without residual deficits: Secondary | ICD-10-CM | POA: Diagnosis not present

## 2018-07-07 DIAGNOSIS — E039 Hypothyroidism, unspecified: Secondary | ICD-10-CM | POA: Diagnosis not present

## 2018-07-07 DIAGNOSIS — M329 Systemic lupus erythematosus, unspecified: Secondary | ICD-10-CM | POA: Diagnosis not present

## 2018-07-07 DIAGNOSIS — E785 Hyperlipidemia, unspecified: Secondary | ICD-10-CM | POA: Diagnosis not present

## 2018-07-07 DIAGNOSIS — I10 Essential (primary) hypertension: Secondary | ICD-10-CM | POA: Diagnosis not present

## 2018-07-07 DIAGNOSIS — G4700 Insomnia, unspecified: Secondary | ICD-10-CM | POA: Diagnosis not present

## 2018-07-07 DIAGNOSIS — D649 Anemia, unspecified: Secondary | ICD-10-CM | POA: Diagnosis not present

## 2018-07-07 DIAGNOSIS — Z96642 Presence of left artificial hip joint: Secondary | ICD-10-CM | POA: Diagnosis not present

## 2018-07-07 DIAGNOSIS — Z471 Aftercare following joint replacement surgery: Secondary | ICD-10-CM | POA: Diagnosis not present

## 2018-07-11 DIAGNOSIS — Z8673 Personal history of transient ischemic attack (TIA), and cerebral infarction without residual deficits: Secondary | ICD-10-CM | POA: Diagnosis not present

## 2018-07-11 DIAGNOSIS — I10 Essential (primary) hypertension: Secondary | ICD-10-CM | POA: Diagnosis not present

## 2018-07-11 DIAGNOSIS — Z96642 Presence of left artificial hip joint: Secondary | ICD-10-CM | POA: Diagnosis not present

## 2018-07-11 DIAGNOSIS — E039 Hypothyroidism, unspecified: Secondary | ICD-10-CM | POA: Diagnosis not present

## 2018-07-11 DIAGNOSIS — D649 Anemia, unspecified: Secondary | ICD-10-CM | POA: Diagnosis not present

## 2018-07-11 DIAGNOSIS — G4700 Insomnia, unspecified: Secondary | ICD-10-CM | POA: Diagnosis not present

## 2018-07-11 DIAGNOSIS — E785 Hyperlipidemia, unspecified: Secondary | ICD-10-CM | POA: Diagnosis not present

## 2018-07-11 DIAGNOSIS — Z471 Aftercare following joint replacement surgery: Secondary | ICD-10-CM | POA: Diagnosis not present

## 2018-07-11 DIAGNOSIS — M329 Systemic lupus erythematosus, unspecified: Secondary | ICD-10-CM | POA: Diagnosis not present

## 2018-07-12 ENCOUNTER — Encounter: Payer: Self-pay | Admitting: Internal Medicine

## 2018-07-12 DIAGNOSIS — M1612 Unilateral primary osteoarthritis, left hip: Secondary | ICD-10-CM | POA: Diagnosis not present

## 2018-07-12 DIAGNOSIS — Z9889 Other specified postprocedural states: Secondary | ICD-10-CM | POA: Diagnosis not present

## 2018-07-12 DIAGNOSIS — Z96642 Presence of left artificial hip joint: Secondary | ICD-10-CM | POA: Diagnosis not present

## 2018-07-14 DIAGNOSIS — M329 Systemic lupus erythematosus, unspecified: Secondary | ICD-10-CM | POA: Diagnosis not present

## 2018-07-14 DIAGNOSIS — D649 Anemia, unspecified: Secondary | ICD-10-CM | POA: Diagnosis not present

## 2018-07-14 DIAGNOSIS — Z96642 Presence of left artificial hip joint: Secondary | ICD-10-CM | POA: Diagnosis not present

## 2018-07-14 DIAGNOSIS — E785 Hyperlipidemia, unspecified: Secondary | ICD-10-CM | POA: Diagnosis not present

## 2018-07-14 DIAGNOSIS — Z471 Aftercare following joint replacement surgery: Secondary | ICD-10-CM | POA: Diagnosis not present

## 2018-07-14 DIAGNOSIS — E039 Hypothyroidism, unspecified: Secondary | ICD-10-CM | POA: Diagnosis not present

## 2018-07-14 DIAGNOSIS — I10 Essential (primary) hypertension: Secondary | ICD-10-CM | POA: Diagnosis not present

## 2018-07-14 DIAGNOSIS — G4700 Insomnia, unspecified: Secondary | ICD-10-CM | POA: Diagnosis not present

## 2018-07-14 DIAGNOSIS — Z8673 Personal history of transient ischemic attack (TIA), and cerebral infarction without residual deficits: Secondary | ICD-10-CM | POA: Diagnosis not present

## 2018-07-21 ENCOUNTER — Encounter: Payer: Medicare HMO | Admitting: Internal Medicine

## 2018-07-26 DIAGNOSIS — M79672 Pain in left foot: Secondary | ICD-10-CM | POA: Diagnosis not present

## 2018-07-26 DIAGNOSIS — M25579 Pain in unspecified ankle and joints of unspecified foot: Secondary | ICD-10-CM | POA: Diagnosis not present

## 2018-07-26 DIAGNOSIS — M25571 Pain in right ankle and joints of right foot: Secondary | ICD-10-CM | POA: Diagnosis not present

## 2018-07-26 DIAGNOSIS — L930 Discoid lupus erythematosus: Secondary | ICD-10-CM | POA: Diagnosis not present

## 2018-07-26 DIAGNOSIS — I4891 Unspecified atrial fibrillation: Secondary | ICD-10-CM | POA: Diagnosis not present

## 2018-07-26 DIAGNOSIS — M79643 Pain in unspecified hand: Secondary | ICD-10-CM | POA: Diagnosis not present

## 2018-07-26 DIAGNOSIS — M79641 Pain in right hand: Secondary | ICD-10-CM | POA: Diagnosis not present

## 2018-07-26 DIAGNOSIS — M19041 Primary osteoarthritis, right hand: Secondary | ICD-10-CM | POA: Diagnosis not present

## 2018-07-26 DIAGNOSIS — D8989 Other specified disorders involving the immune mechanism, not elsewhere classified: Secondary | ICD-10-CM | POA: Diagnosis not present

## 2018-07-26 DIAGNOSIS — M25572 Pain in left ankle and joints of left foot: Secondary | ICD-10-CM | POA: Diagnosis not present

## 2018-07-26 DIAGNOSIS — M79642 Pain in left hand: Secondary | ICD-10-CM | POA: Diagnosis not present

## 2018-07-26 DIAGNOSIS — I10 Essential (primary) hypertension: Secondary | ICD-10-CM | POA: Diagnosis not present

## 2018-07-26 DIAGNOSIS — M2012 Hallux valgus (acquired), left foot: Secondary | ICD-10-CM | POA: Diagnosis not present

## 2018-07-26 DIAGNOSIS — M329 Systemic lupus erythematosus, unspecified: Secondary | ICD-10-CM | POA: Diagnosis not present

## 2018-07-26 DIAGNOSIS — M79671 Pain in right foot: Secondary | ICD-10-CM | POA: Diagnosis not present

## 2018-07-26 DIAGNOSIS — M21611 Bunion of right foot: Secondary | ICD-10-CM | POA: Diagnosis not present

## 2018-07-26 DIAGNOSIS — M19042 Primary osteoarthritis, left hand: Secondary | ICD-10-CM | POA: Diagnosis not present

## 2018-07-26 DIAGNOSIS — E785 Hyperlipidemia, unspecified: Secondary | ICD-10-CM | POA: Diagnosis not present

## 2018-08-16 DIAGNOSIS — I4891 Unspecified atrial fibrillation: Secondary | ICD-10-CM | POA: Diagnosis not present

## 2018-08-16 DIAGNOSIS — M329 Systemic lupus erythematosus, unspecified: Secondary | ICD-10-CM | POA: Diagnosis not present

## 2018-08-16 DIAGNOSIS — M25579 Pain in unspecified ankle and joints of unspecified foot: Secondary | ICD-10-CM | POA: Diagnosis not present

## 2018-08-16 DIAGNOSIS — E785 Hyperlipidemia, unspecified: Secondary | ICD-10-CM | POA: Diagnosis not present

## 2018-08-16 DIAGNOSIS — I10 Essential (primary) hypertension: Secondary | ICD-10-CM | POA: Diagnosis not present

## 2018-08-16 DIAGNOSIS — M79643 Pain in unspecified hand: Secondary | ICD-10-CM | POA: Diagnosis not present

## 2018-08-22 ENCOUNTER — Other Ambulatory Visit: Payer: Self-pay | Admitting: Internal Medicine

## 2018-08-26 ENCOUNTER — Encounter: Payer: Self-pay | Admitting: Internal Medicine

## 2018-08-30 ENCOUNTER — Other Ambulatory Visit: Payer: Self-pay

## 2018-08-30 DIAGNOSIS — R6889 Other general symptoms and signs: Secondary | ICD-10-CM | POA: Diagnosis not present

## 2018-08-30 DIAGNOSIS — Z20822 Contact with and (suspected) exposure to covid-19: Secondary | ICD-10-CM

## 2018-08-31 LAB — NOVEL CORONAVIRUS, NAA: SARS-CoV-2, NAA: NOT DETECTED

## 2018-09-02 ENCOUNTER — Encounter: Payer: Self-pay | Admitting: Internal Medicine

## 2018-09-02 ENCOUNTER — Ambulatory Visit (INDEPENDENT_AMBULATORY_CARE_PROVIDER_SITE_OTHER): Payer: Medicare HMO | Admitting: Internal Medicine

## 2018-09-02 DIAGNOSIS — F5101 Primary insomnia: Secondary | ICD-10-CM

## 2018-09-02 MED ORDER — ESZOPICLONE 2 MG PO TABS: 2.0000 mg | ORAL_TABLET | Freq: Every evening | ORAL | 5 refills | Status: DC | PRN

## 2018-09-02 NOTE — Assessment & Plan Note (Addendum)
Sent in lunesta 2 mg daily prn after counseling about benefit and risk. She has tolerated this well in the past. [place] database reviewed prior to script without inappropriate fills.

## 2018-09-02 NOTE — Progress Notes (Signed)
Virtual Visit via Video Note  I connected with Elaine Allen on 09/02/18 at  9:00 AM EDT by a video enabled telemedicine application and verified that I am speaking with the correct person using two identifiers.  The patient and the provider were at separate locations throughout the entire encounter.   I discussed the limitations of evaluation and management by telemedicine and the availability of in person appointments. The patient expressed understanding and agreed to proceed.  History of Present Illness: The patient is a 63 y.o. female with visit for insomnia. Previously she was taking lunesta. This was stopped around her hip replacement in June due to taking oxycodone at the same time. She has not restarted since then. She has had sleep problems since that time with not sleeping for days at a time. She is very tired and not coordinated with lack of sleep. She is still working okay. She then will sleep but only for 4-6 hours and she is tired of this cycle. She denies side effect previously with lunesta and is not sure why she did not resume.  Observations/Objective: Appearance: normal, breathing appears normal, casual grooming, abdomen does not appear distended, throat normal, memory normal, mental status is A and O times 3  Assessment and Plan: See problem oriented charting  Follow Up Instructions: rx lunesta, explained benefit versus risk  Visit time 25 minutes: greater than 50% of that time was spent in face to face counseling and coordination of care with the patient: counseled about risk and chance for addiction, dependence, balance change with various treatment options  I discussed the assessment and treatment plan with the patient. The patient was provided an opportunity to ask questions and all were answered. The patient agreed with the plan and demonstrated an understanding of the instructions.   The patient was advised to call back or seek an in-person evaluation if the symptoms  worsen or if the condition fails to improve as anticipated.  Hoyt Koch, MD

## 2018-09-06 DIAGNOSIS — E785 Hyperlipidemia, unspecified: Secondary | ICD-10-CM | POA: Diagnosis not present

## 2018-09-06 DIAGNOSIS — R0602 Shortness of breath: Secondary | ICD-10-CM | POA: Diagnosis not present

## 2018-09-06 DIAGNOSIS — M329 Systemic lupus erythematosus, unspecified: Secondary | ICD-10-CM | POA: Diagnosis not present

## 2018-09-06 DIAGNOSIS — M79643 Pain in unspecified hand: Secondary | ICD-10-CM | POA: Diagnosis not present

## 2018-09-06 DIAGNOSIS — I4891 Unspecified atrial fibrillation: Secondary | ICD-10-CM | POA: Diagnosis not present

## 2018-09-06 DIAGNOSIS — I10 Essential (primary) hypertension: Secondary | ICD-10-CM | POA: Diagnosis not present

## 2018-09-06 DIAGNOSIS — Z9889 Other specified postprocedural states: Secondary | ICD-10-CM | POA: Diagnosis not present

## 2018-09-06 DIAGNOSIS — M7551 Bursitis of right shoulder: Secondary | ICD-10-CM | POA: Diagnosis not present

## 2018-09-06 DIAGNOSIS — M25579 Pain in unspecified ankle and joints of unspecified foot: Secondary | ICD-10-CM | POA: Diagnosis not present

## 2018-09-06 LAB — HEPATIC FUNCTION PANEL
ALT: 15 (ref 7–35)
AST: 17 (ref 13–35)
Alkaline Phosphatase: 143 — AB (ref 25–125)
Bilirubin, Total: 0.3

## 2018-09-06 LAB — CBC AND DIFFERENTIAL
HCT: 32 — AB (ref 36–46)
Hemoglobin: 10.5 — AB (ref 12.0–16.0)
Platelets: 62 — AB (ref 150–399)
WBC: 2.4

## 2018-09-06 LAB — BASIC METABOLIC PANEL
BUN: 18 (ref 4–21)
Glucose: 91
Sodium: 147 (ref 137–147)

## 2018-09-06 LAB — POCT ERYTHROCYTE SEDIMENTATION RATE, NON-AUTOMATED: Sed Rate: 70

## 2018-09-09 ENCOUNTER — Encounter: Payer: Self-pay | Admitting: Internal Medicine

## 2018-09-09 NOTE — Progress Notes (Signed)
Abstracted and sent to scan  

## 2018-10-05 DIAGNOSIS — H524 Presbyopia: Secondary | ICD-10-CM | POA: Diagnosis not present

## 2018-10-05 DIAGNOSIS — H2513 Age-related nuclear cataract, bilateral: Secondary | ICD-10-CM | POA: Diagnosis not present

## 2018-10-14 ENCOUNTER — Other Ambulatory Visit: Payer: Self-pay

## 2018-10-14 DIAGNOSIS — E2839 Other primary ovarian failure: Secondary | ICD-10-CM

## 2018-10-21 ENCOUNTER — Ambulatory Visit (INDEPENDENT_AMBULATORY_CARE_PROVIDER_SITE_OTHER): Payer: Medicare HMO

## 2018-10-21 ENCOUNTER — Other Ambulatory Visit: Payer: Self-pay

## 2018-10-21 DIAGNOSIS — Z23 Encounter for immunization: Secondary | ICD-10-CM | POA: Diagnosis not present

## 2018-10-21 DIAGNOSIS — Z299 Encounter for prophylactic measures, unspecified: Secondary | ICD-10-CM

## 2018-10-21 NOTE — Progress Notes (Signed)
pre

## 2018-11-15 ENCOUNTER — Encounter: Payer: Self-pay | Admitting: Internal Medicine

## 2018-11-15 DIAGNOSIS — M329 Systemic lupus erythematosus, unspecified: Secondary | ICD-10-CM

## 2018-12-21 DIAGNOSIS — H31091 Other chorioretinal scars, right eye: Secondary | ICD-10-CM | POA: Diagnosis not present

## 2018-12-21 DIAGNOSIS — H2513 Age-related nuclear cataract, bilateral: Secondary | ICD-10-CM | POA: Diagnosis not present

## 2019-01-10 ENCOUNTER — Encounter: Payer: Self-pay | Admitting: Internal Medicine

## 2019-01-12 ENCOUNTER — Ambulatory Visit: Payer: Medicare Other | Admitting: Internal Medicine

## 2019-01-16 ENCOUNTER — Ambulatory Visit: Payer: Medicare HMO | Admitting: Rheumatology

## 2019-01-18 ENCOUNTER — Encounter: Payer: Self-pay | Admitting: Internal Medicine

## 2019-01-20 ENCOUNTER — Other Ambulatory Visit: Payer: Self-pay | Admitting: Internal Medicine

## 2019-02-08 ENCOUNTER — Ambulatory Visit: Payer: Medicare HMO | Admitting: Rheumatology

## 2019-02-08 DIAGNOSIS — M329 Systemic lupus erythematosus, unspecified: Secondary | ICD-10-CM | POA: Diagnosis not present

## 2019-02-08 DIAGNOSIS — I4891 Unspecified atrial fibrillation: Secondary | ICD-10-CM | POA: Diagnosis not present

## 2019-02-08 DIAGNOSIS — M2550 Pain in unspecified joint: Secondary | ICD-10-CM | POA: Diagnosis not present

## 2019-02-08 DIAGNOSIS — M791 Myalgia, unspecified site: Secondary | ICD-10-CM | POA: Diagnosis not present

## 2019-02-20 ENCOUNTER — Ambulatory Visit: Payer: Self-pay | Admitting: Rheumatology

## 2019-03-08 ENCOUNTER — Telehealth: Payer: Self-pay | Admitting: Hematology

## 2019-03-08 DIAGNOSIS — M329 Systemic lupus erythematosus, unspecified: Secondary | ICD-10-CM | POA: Diagnosis not present

## 2019-03-08 DIAGNOSIS — M2550 Pain in unspecified joint: Secondary | ICD-10-CM | POA: Diagnosis not present

## 2019-03-08 DIAGNOSIS — R531 Weakness: Secondary | ICD-10-CM | POA: Diagnosis not present

## 2019-03-08 DIAGNOSIS — M791 Myalgia, unspecified site: Secondary | ICD-10-CM | POA: Diagnosis not present

## 2019-03-08 DIAGNOSIS — I4891 Unspecified atrial fibrillation: Secondary | ICD-10-CM | POA: Diagnosis not present

## 2019-03-08 NOTE — Telephone Encounter (Signed)
Received a call from [facility] at [facility] for a stat referral w/dx of low platelets at 30. Pt has been scheduled to see Dr. Candise Che on 3/4 at 11am. Marena Chancy will notify Ms. Kochanski and fax the referral.

## 2019-03-09 ENCOUNTER — Inpatient Hospital Stay: Payer: Medicare Other

## 2019-03-09 ENCOUNTER — Other Ambulatory Visit: Payer: Self-pay

## 2019-03-09 ENCOUNTER — Inpatient Hospital Stay: Payer: Medicare Other | Attending: Hematology | Admitting: Hematology

## 2019-03-09 ENCOUNTER — Telehealth: Payer: Self-pay | Admitting: Hematology

## 2019-03-09 VITALS — BP 136/77 | HR 98 | Temp 98.0°F | Resp 18 | Ht 64.0 in | Wt 187.7 lb

## 2019-03-09 DIAGNOSIS — Z8042 Family history of malignant neoplasm of prostate: Secondary | ICD-10-CM | POA: Diagnosis not present

## 2019-03-09 DIAGNOSIS — D693 Immune thrombocytopenic purpura: Secondary | ICD-10-CM | POA: Diagnosis not present

## 2019-03-09 DIAGNOSIS — Z8673 Personal history of transient ischemic attack (TIA), and cerebral infarction without residual deficits: Secondary | ICD-10-CM

## 2019-03-09 DIAGNOSIS — M329 Systemic lupus erythematosus, unspecified: Secondary | ICD-10-CM | POA: Diagnosis not present

## 2019-03-09 DIAGNOSIS — D696 Thrombocytopenia, unspecified: Secondary | ICD-10-CM

## 2019-03-09 DIAGNOSIS — D6862 Lupus anticoagulant syndrome: Secondary | ICD-10-CM | POA: Diagnosis not present

## 2019-03-09 DIAGNOSIS — Z87442 Personal history of urinary calculi: Secondary | ICD-10-CM | POA: Diagnosis not present

## 2019-03-09 DIAGNOSIS — R0602 Shortness of breath: Secondary | ICD-10-CM | POA: Insufficient documentation

## 2019-03-09 DIAGNOSIS — Z833 Family history of diabetes mellitus: Secondary | ICD-10-CM

## 2019-03-09 DIAGNOSIS — Z79899 Other long term (current) drug therapy: Secondary | ICD-10-CM

## 2019-03-09 DIAGNOSIS — I10 Essential (primary) hypertension: Secondary | ICD-10-CM | POA: Diagnosis not present

## 2019-03-09 DIAGNOSIS — R42 Dizziness and giddiness: Secondary | ICD-10-CM | POA: Diagnosis not present

## 2019-03-09 DIAGNOSIS — Z90710 Acquired absence of both cervix and uterus: Secondary | ICD-10-CM | POA: Diagnosis not present

## 2019-03-09 DIAGNOSIS — Z800 Family history of malignant neoplasm of digestive organs: Secondary | ICD-10-CM | POA: Diagnosis not present

## 2019-03-09 DIAGNOSIS — R040 Epistaxis: Secondary | ICD-10-CM | POA: Diagnosis not present

## 2019-03-09 DIAGNOSIS — Z5112 Encounter for antineoplastic immunotherapy: Secondary | ICD-10-CM | POA: Insufficient documentation

## 2019-03-09 LAB — CBC WITH DIFFERENTIAL/PLATELET
Abs Immature Granulocytes: 0.01 10*3/uL (ref 0.00–0.07)
Basophils Absolute: 0 10*3/uL (ref 0.0–0.1)
Basophils Relative: 0 %
Eosinophils Absolute: 0.1 10*3/uL (ref 0.0–0.5)
Eosinophils Relative: 3 %
HCT: 40.1 % (ref 36.0–46.0)
Hemoglobin: 13.2 g/dL (ref 12.0–15.0)
Immature Granulocytes: 0 %
Lymphocytes Relative: 26 %
Lymphs Abs: 1 10*3/uL (ref 0.7–4.0)
MCH: 29.6 pg (ref 26.0–34.0)
MCHC: 32.9 g/dL (ref 30.0–36.0)
MCV: 89.9 fL (ref 80.0–100.0)
Monocytes Absolute: 0.3 10*3/uL (ref 0.1–1.0)
Monocytes Relative: 9 %
Neutro Abs: 2.2 10*3/uL (ref 1.7–7.7)
Neutrophils Relative %: 62 %
Platelets: 29 10*3/uL — ABNORMAL LOW (ref 150–400)
RBC: 4.46 MIL/uL (ref 3.87–5.11)
RDW: 14 % (ref 11.5–15.5)
WBC: 3.7 10*3/uL — ABNORMAL LOW (ref 4.0–10.5)
nRBC: 0 % (ref 0.0–0.2)

## 2019-03-09 LAB — CMP (CANCER CENTER ONLY)
ALT: 16 U/L (ref 0–44)
AST: 23 U/L (ref 15–41)
Albumin: 4 g/dL (ref 3.5–5.0)
Alkaline Phosphatase: 116 U/L (ref 38–126)
Anion gap: 7 (ref 5–15)
BUN: 17 mg/dL (ref 8–23)
CO2: 30 mmol/L (ref 22–32)
Calcium: 9.8 mg/dL (ref 8.9–10.3)
Chloride: 103 mmol/L (ref 98–111)
Creatinine: 0.89 mg/dL (ref 0.44–1.00)
GFR, Est AFR Am: >60 mL/min (ref >60)
GFR, Estimated: >60 mL/min (ref >60)
Glucose, Bld: 106 mg/dL — ABNORMAL HIGH (ref 70–99)
Potassium: 3.7 mmol/L (ref 3.5–5.1)
Sodium: 140 mmol/L (ref 135–145)
Total Bilirubin: 0.6 mg/dL (ref 0.3–1.2)
Total Protein: 8.7 g/dL — ABNORMAL HIGH (ref 6.5–8.1)

## 2019-03-09 LAB — RETICULOCYTES
Immature Retic Fract: 13 % (ref 2.3–15.9)
RBC.: 4.44 MIL/uL (ref 3.87–5.11)
Retic Count, Absolute: 59.9 10*3/uL (ref 19.0–186.0)
Retic Ct Pct: 1.4 % (ref 0.4–3.1)

## 2019-03-09 LAB — HEPATITIS C ANTIBODY: HCV Ab: NONREACTIVE

## 2019-03-09 LAB — IMMATURE PLATELET FRACTION: Immature Platelet Fraction: 17.2 % — ABNORMAL HIGH (ref 1.2–8.6)

## 2019-03-09 LAB — VITAMIN B12: Vitamin B-12: 203 pg/mL (ref 180–914)

## 2019-03-09 LAB — HEPATITIS B CORE ANTIBODY, TOTAL: Hep B Core Total Ab: NONREACTIVE

## 2019-03-09 LAB — PLATELET BY CITRATE

## 2019-03-09 LAB — LACTATE DEHYDROGENASE: LDH: 186 U/L (ref 98–192)

## 2019-03-09 LAB — HEPATITIS B SURFACE ANTIGEN: Hepatitis B Surface Ag: NONREACTIVE

## 2019-03-09 NOTE — Telephone Encounter (Signed)
Scheduled appt per 3/4 los. Pt is aware of appt dates and times.

## 2019-03-09 NOTE — Progress Notes (Signed)
HEMATOLOGY/ONCOLOGY CONSULTATION NOTE  Date of Service: 03/09/2019  Patient Care Team: Myrlene Broker, MD as PCP - General (Internal Medicine)  REFERRING PHYSICIAN: Myrlene Broker, *  CHIEF COMPLAINTS/PURPOSE OF CONSULTATION:  Thrombocytopenia   HISTORY OF PRESENTING ILLNESS:   ELODIE PANAMENO is a wonderful 64 y.o. female who has been referred to [place] by Myrlene Broker, * for evaluation and management of Thrombocytopenia. The pt reports that she is doing well overall.   The pt reports she was diagnosed with Lupus in 2004. She was on Plaquenil for 2 weeks at beginning but had really bad rash. In 2018 she has had pain and edema all over and OTC medications did not helped.  She also had her liver checked and there was concern over low blood counts. Due to low platelet counts in 2018 she was put on 20mg  of predinsone which helped her symptoms. Recently pt has been taking 5mg  of predinsone and 50mg  of Imuran every other day.   Pt reports she has been having body aches, fatigue and dizzy spells. She is currently taking blood pressure medication. The dizzy spells usually are onset by standing and last for one to two minutes. She has had aterial fibulaiton but has not seen cardiologist.  Pt has had helathy appetitie and hydration. She has also had recent nose bleeds that have lasted up to 8 hours.   Most recent lab results (03/08/2019) of CBC is as follows: all values are WNL except for WBC at 3.7, Platelet at 30, Neutrophil at 40, MONO at 12.7  Past lab results (09/06/2019) of CBC is as follows: all values are WNL except for Hemoglobin at 10.5, HCT at 32, Platelets at 62. (09/06/2019) of Hepatic function Panel: all values are WNL except: Alkaline Phosphatase at 143 (09/06/2019) POCT erythrocyte sed rate, Non-automated is WNL (09/06/2019) of Basic Metabolic Panel is WNL  On review of systems, pt reports SOB, joint edema, nose bleeds, fatigue, dizziness and denies  hematuria, blood in stool, rashes and any other symptoms.   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 Arthritis   . Blind right eye    sees colors  . Chronic anemia   . Detached retina, right    corrected good for about 6 months sees peripheral colors only  . History of kidney stones   . Hypertension   . Hyperthyroidism   . Lupus (HCC)   . Pneumonia   . Retinal detachment   . Spasmodic dysphonia   . Stroke (HCC) 2015   no permanant defecits had weakness in left arm in hospital Had PT also  . Vertigo    Hx of preceding stroke     SURGICAL HISTORY: Past Surgical History:  Procedure Laterality Date  . ABDOMINAL HYSTERECTOMY    . BREAST LUMPECTOMY     left breast benign  . KNEE SURGERY Left   . THYROID SURGERY    . TOTAL HIP ARTHROPLASTY Left 06/27/2018   Procedure: TOTAL HIP ARTHROPLASTY ANTERIOR APPROACH;  Surgeon: 11/06/2019, MD;  Location: [facility];  Service: Orthopedics;  Laterality: Left;     SOCIAL HISTORY: Social History   Socioeconomic History  . Marital status: Single    Spouse name: Not on file  . Number of children: 2  . Years of education: college  . Highest education level: Not on file  Occupational History  . Occupation: options  Tobacco Use  . Smoking status: Never Smoker  . Smokeless tobacco: Never Used  Substance and Sexual Activity  . Alcohol use: No  . Drug use: No  . Sexual activity: Not Currently  Other Topics Concern  . Not on file  Social History Narrative  . Not on file   Social Determinants of Health   Financial Resource Strain:   . Difficulty of Paying Living Expenses: Not on file  Food Insecurity:   . Worried About Programme researcher, broadcasting/film/video in the Last Year: Not on file  . Ran Out of Food in the Last Year: Not on file  Transportation Needs:   . Lack of Transportation (Medical): Not on file  . Lack of Transportation  (Non-Medical): Not on file  Physical Activity:   . Days of Exercise per Week: Not on file  . Minutes of Exercise per Session: Not on file  Stress:   . Feeling of Stress : Not on file  Social Connections:   . Frequency of Communication with Friends and Family: Not on file  . Frequency of Social Gatherings with Friends and Family: Not on file  . Attends Religious Services: Not on file  . Active Member of Clubs or Organizations: Not on file  . Attends Banker Meetings: Not on file  . Marital Status: Not on file  Intimate Partner Violence:   . Fear of Current or Ex-Partner: Not on file  . Emotionally Abused: Not on file  . Physically Abused: Not on file  . Sexually Abused: Not on file     FAMILY HISTORY: Family History  Problem Relation Age of Onset  . Cancer Mother        liver  . Diabetes Mother   . Cancer Father        prostate,METS, stomach      ALLERGIES:   is allergic to plaquenil [hydroxychloroquine sulfate]; azithromycin; lactose intolerance (gi); and penicillin g.   MEDICATIONS:  Current Outpatient Medications  Medication Sig Dispense Refill  . azaTHIOprine (IMURAN) 50 MG tablet Take 50 mg by mouth daily.    . eszopiclone (LUNESTA) 2 MG TABS tablet Take 1 tablet (2 mg total) by mouth at bedtime as needed for sleep. Take immediately before bedtime 30 tablet 5  . losartan-hydrochlorothiazide (HYZAAR) 100-25 MG tablet TAKE 1 TABLET BY MOUTH DAILY 30 tablet 5   No current facility-administered medications for this visit.     REVIEW OF SYSTEMS:   A 10+ POINT REVIEW OF SYSTEMS WAS OBTAINED including neurology, dermatology, psychiatry, cardiac, respiratory, lymph, extremities, GI, GU, Musculoskeletal, constitutional, breasts, reproductive, HEENT.  All pertinent positives are noted in the HPI.  All others are negative.    PHYSICAL EXAMINATION: ECOG PERFORMANCE STATUS: 1 - Symptomatic but completely ambulatory  Vitals:   03/09/19 1133  BP: 136/77    Pulse: 98  Resp: 18  Temp: 98 F (36.7 C)  SpO2: 98%   Filed Weights   03/09/19 1133  Weight: 187 lb 11.2 oz (85.1 kg)   Body mass index is 32.22 kg/m.   GENERAL:alert, in no acute distress and comfortable SKIN: no acute rashes, no significant lesions EYES: conjunctiva are pink and non-injected, sclera anicteric OROPHARYNX: MMM, no exudates, no oropharyngeal erythema or ulceration NECK: supple, no JVD LYMPH:  no palpable lymphadenopathy in the cervical, axillary or inguinal regions LUNGS: clear to auscultation b/l with normal respiratory effort HEART: regular rate & rhythm ABDOMEN:  normoactive bowel sounds , non  tender, not distended. Extremity: no pedal edema PSYCH: alert & oriented x 3 with fluent speech NEURO: no focal motor/sensory deficits   LABORATORY DATA:  I have reviewed the data as listed  CBC Latest Ref Rng & Units 03/09/2019 09/06/2018 06/29/2018  WBC 4.0 - 10.5 K/uL 3.7(L) 2.4 6.0  Hemoglobin 12.0 - 15.0 g/dL 83.6 10.5(A) 10.5(L)  Hematocrit 36.0 - 46.0 % 40.1 32(A) 33.5(L)  Platelets 150 - 400 K/uL 29(L) 62(A) 115(L)    CMP Latest Ref Rng & Units 03/09/2019 09/06/2018 06/28/2018  Glucose 70 - 99 mg/dL 629(U) - 765(Y)  BUN 8 - 23 mg/dL 17 18 65(K)  Creatinine 0.44 - 1.00 mg/dL 3.54 - 6.56(C)  Sodium 135 - 145 mmol/L 140 147 137  Potassium 3.5 - 5.1 mmol/L 3.7 - 3.9  Chloride 98 - 111 mmol/L 103 - 104  CO2 22 - 32 mmol/L 30 - 25  Calcium 8.9 - 10.3 mg/dL 9.8 - 8.9  Total Protein 6.5 - 8.1 g/dL 1.2(X) - -  Total Bilirubin 0.3 - 1.2 mg/dL 0.6 - -  Alkaline Phos 38 - 126 U/L 116 143(A) -  AST 15 - 41 U/L 23 17 -  ALT 0 - 44 U/L 16 15 -   . Lab Results  Component Value Date   LDH 186 03/09/2019    Component     Latest Ref Rng & Units 03/09/2019  Retic Ct Pct     0.4 - 3.1 % 1.4  RBC.     3.87 - 5.11 MIL/uL 4.44  Retic Count, Absolute     19.0 - 186.0 K/uL 59.9  Immature Retic Fract     2.3 - 15.9 % 13.0  Beta-2 Glycoprotein I Ab, IgG     0 - 20 GPI  IgG units <9  Beta-2-Glycoprotein I IgM     0 - 32 GPI IgM units <9  Beta-2-Glycoprotein I IgA     0 - 25 GPI IgA units <9  PTT Lupus Anticoagulant     0.0 - 51.9 sec 47.4  DRVVT     0.0 - 47.0 sec 57.5 (H)  Lupus Anticoag Interp      Comment:  Anticardiolipin Ab,IgG,Qn     0 - 14 GPL U/mL <9  Anticardiolipin Ab,IgM,Qn     0 - 12 MPL U/mL <9  Anticardiolipin Ab,IgA,Qn     0 - 11 APL U/mL <9  Vitamin B12     180 - 914 pg/mL 203  Hepatitis B Surface Ag     NON REACTIVE NON REACTIVE  Hep B Core Total Ab     NON REACTIVE NON REACTIVE  HCV Ab     NON REACTIVE NON REACTIVE  Platelet CT in Citrate      PLATELET COUNT PERFORMED ON CITRATED BLOOD  Immature Platelet Fraction     1.2 - 8.6 % 17.2 (H)  LDH     98 - 192 U/L 186   Lupus anticoagulant panel      The value has a corrected status.      No reference range information available      Resulting Lab: [facility]      Comments:           (NOTE)           Results are consistent with the presence of a lupus            anticoagulant.     RADIOGRAPHIC STUDIES: I have personally reviewed the radiological images  as listed and agreed with the findings in the report. No results found.   ASSESSMENT & PLAN:   AYLANI SPURLOCK is a 64 y.o. female with:  1. Thrombocytopenia - likely related to Immune thrombocytopenia in the setting of lupus. No evidence of TTP  Partly thrombocytopenia could be related to Imuran. Platelet counts seem to have dropped in tandem with decreasing doses of steroids.  2. Lupus - on Imuran  3. H/o AVN s/p left THA.  4. Lupus anticoagulant positive PLAN: A: -Discussed patient's most recent labs from (03/08/2019) of CBC is as follows: all values are WNL except for WBC at 3.7, Platelet at 30, Neutrophil at 40, MONO at 12.7 -Discussed how Lupus can affect platelets -Discussed antiphospholipid antibodies testing -- noted to be lupus positive     -Discussed Immune Thrombocytopenia  and treatments of it -Advised on treatment options like changing medications, taking  splenectomy -Taking Steroids, weekly shot Nplate, taking Rituxan  -Advised low platelets could be due to Lupus and medication -Advised on high-dose steroids could cause AVN but might to do short term lower dose steroids -Recommends staying away from [place] while platelets are low -Recommends sterile saline and afrin nasal spray to stop epistaxis bleeding  -Recommends seeing cardiologist for dizzy episodes - will defer need for referral to PCP -after discussing with patient would recommend starting low dose prednisone 20mg  po daily and discontinue Imuran. Would treat with Rituxan weekly x 4 doses -hepatitis profile for Rituxan clearance.  FOLLOW UP Labs today RTC with Dr Irene Limbo with labs in 2 weeks   All of the patients questions were answered with apparent satisfaction. The patient knows to call the clinic with any problems, questions or concerns.  The total time spent in the appt was 60 minutes and more than 50% was on counseling and direct patient cares.  All of the patient's questions were answered with apparent satisfaction. The patient knows to call the clinic with any problems, questions or concerns.    [name] MD [place] AAHIVMS [facility] Hematology/Oncology [facility]  (Office):       [phone] (Work cell):  [phone] (Fax):           [phone]  03/09/2019 8:52 AM  I, Dawayne Cirri am acting as a Education administrator for Dr. [name].   .I have reviewed the above documentation for accuracy and completeness, and I agree with the above. Brunetta Genera MD

## 2019-03-09 NOTE — Patient Instructions (Signed)
Thank you for choosing [facility] to provide your oncology and hematology care.   Should you have questions after your visit to the [facility] ([facility]), please contact this office at [phone] between 8:30 AM and 4:30 PM.  Voice mails left after 4:00 PM may not be returned until the following business day.  Calls received after 4:30 PM will be answered by an off-site Nurse Triage Line.    Prescription Refills:  Please have your pharmacy contact us directly for most prescription requests.  Contact the office directly for refills of narcotics (pain medications). Allow 48-72 hours for refills.  Appointments: Please contact the [facility] scheduling department [phone] for questions regarding [facility] appointment scheduling.  Contact the schedulers with any scheduling changes so that your appointment can be rescheduled in a timely manner.   Central Scheduling for [facility] [phone] - Call to schedule procedures such as PET scans, CT scans, MRI, Ultrasound, etc.  To afford each patient quality time with our providers, please arrive 30 minutes before your scheduled appointment time.  If you arrive late for your appointment, you may be asked to reschedule.  We strive to give you quality time with our providers, and arriving late affects you and other patients whose appointments are after yours. If you are a no show for multiple scheduled visits, you may be dismissed from the clinic at the providers discretion.     Resources: [facility] Social Workers [phone] for additional information on assistance programs or assistance connecting with community support programs   [place] DSS  [phone]: Information regarding food stamps, Medicaid, and utility assistance [place] [phone]   [place] [employer]'s shared-ride transportation service for eligible riders who have a disability that prevents them from riding the fixed route bus.   Medicare Rights Center  800-333-4114 Helps people with Medicare understand their rights and benefits, navigate the Medicare system, and secure the quality healthcare they deserve [employer] 800-227-2345 Assists patients locate various types of support and financial assistance Cancer Care: 1-800-813-HOPE (4673) Provides financial assistance, online support groups, medication/co-pay assistance.   Transportation Assistance for appointments at [facility]: Transportation Coordinator [phone]  Again, thank you for choosing [facility] for your care.       

## 2019-03-11 LAB — BETA-2-GLYCOPROTEIN I ABS, IGG/M/A
Beta-2 Glyco I IgG: <9 GPI IgG units (ref 0–20)
Beta-2-Glycoprotein I IgA: <9 GPI IgA units (ref 0–25)
Beta-2-Glycoprotein I IgM: <9 GPI IgM units (ref 0–32)

## 2019-03-11 LAB — CARDIOLIPIN ANTIBODIES, IGG, IGM, IGA
Anticardiolipin IgA: <9 APL U/mL (ref 0–11)
Anticardiolipin IgG: <9 GPL U/mL (ref 0–14)
Anticardiolipin IgM: <9 MPL U/mL (ref 0–12)

## 2019-03-13 LAB — DRVVT MIX: dRVVT Mix: 44.9 s — ABNORMAL HIGH (ref 0.0–40.4)

## 2019-03-13 LAB — DRVVT CONFIRM: dRVVT Confirm: 1.5 ratio — ABNORMAL HIGH (ref 0.8–1.2)

## 2019-03-13 LAB — LUPUS ANTICOAGULANT PANEL
DRVVT: 57.5 s — ABNORMAL HIGH (ref 0.0–47.0)
PTT Lupus Anticoagulant: 47.4 s (ref 0.0–51.9)

## 2019-03-22 ENCOUNTER — Other Ambulatory Visit: Payer: Self-pay | Admitting: *Deleted

## 2019-03-22 ENCOUNTER — Ambulatory Visit: Payer: Self-pay | Admitting: Rheumatology

## 2019-03-22 ENCOUNTER — Other Ambulatory Visit: Payer: Self-pay | Admitting: Hematology

## 2019-03-22 DIAGNOSIS — D693 Immune thrombocytopenic purpura: Secondary | ICD-10-CM

## 2019-03-22 DIAGNOSIS — D696 Thrombocytopenia, unspecified: Secondary | ICD-10-CM

## 2019-03-22 NOTE — Progress Notes (Signed)
HEMATOLOGY/ONCOLOGY CONSULTATION NOTE  Date of Service: 03/22/2019  Patient Care Team: Myrlene Broker, MD as PCP - General (Internal Medicine)  REFERRING PHYSICIAN: Myrlene Broker, *  CHIEF COMPLAINTS/PURPOSE OF CONSULTATION:  F/u for mx of ITP   INTERVAL HISTORY  Elaine Allen is a wonderful 64 y.o. female who has been referred to [place] by Myrlene Broker, * for evaluation and management of Thrombocytopenia. The patient's last visit with [place] was on 03/09/2019. The pt reports that she is doing well overall.  The pt reports she is doing good. Pt has gotten her COVID19 vaccination abut 1 week ago. She had no problems tolerating vaccine.   Lab results today (03/23/19) of CBC w/diff and CMP is as follows: all values are WNL except for WBC at 3.1K, Platelet count at 43K, Neutro Abs at 0.9K. 03/09/2019 of LDH at 186 03/09/2019 of dRVVT Confirm at 1.5 03/09/2019 of dRVVT Mix at 44.9 03/09/2019 of Reticulocytes: all values are WNL 03/09/2019 of Beta-2-glycoprotein I abs, IgG/M/A: all values are WNL 03/09/2019 of Lupus anticoagulant panel: all values are WNL except DRVVT at 57.5 03/09/2019 of Cardiolipin antibodies, IgG, IgM, IgA: all values are WNL 03/09/2019 of Vitamin B12 at 203 03/09/2019 of Hepatiti B Surface antigen: all values are WNL 03/09/2019 of Hepatitis B Core Antibody total: all values are WNL 03/09/2019 of Hepatitis C antibody: all values are WNL 03/09/2019 of Immature Platelet Fraction at 17.2  On review of systems, pt reports denies nose bleeds, fever, chills, and any other symptoms.     MEDICAL HISTORY:  Past Medical History:  Diagnosis Date  . Arthritis   . Blind right eye    sees colors  . Chronic anemia   . Detached retina, right    corrected good for about 6 months sees peripheral colors only  . History of kidney stones   . Hypertension   . Hyperthyroidism   . Lupus (HCC)   . Pneumonia   . Retinal detachment   . Spasmodic dysphonia    . Stroke (HCC) 2015   no permanant defecits had weakness in left arm in hospital Had PT also  . Vertigo    Hx of preceding stroke     SURGICAL HISTORY: Past Surgical History:  Procedure Laterality Date  . ABDOMINAL HYSTERECTOMY    . BREAST LUMPECTOMY     left breast benign  . KNEE SURGERY Left   . THYROID SURGERY    . TOTAL HIP ARTHROPLASTY Left 06/27/2018   Procedure: TOTAL HIP ARTHROPLASTY ANTERIOR APPROACH;  Surgeon: Gean Birchwood, MD;  Location: [facility];  Service: Orthopedics;  Laterality: Left;     SOCIAL HISTORY: Social History   Socioeconomic History  . Marital status: Single    Spouse name: Not on file  . Number of children: 2  . Years of education: college  . Highest education level: Not on file  Occupational History  . Occupation: options  Tobacco Use  . Smoking status: Never Smoker  . Smokeless tobacco: Never Used  Substance and Sexual Activity  . Alcohol use: No  . Drug use: No  . Sexual activity: Not Currently  Other Topics Concern  . Not on file  Social History Narrative  . Not on file   Social Determinants of Health   Financial Resource Strain:   . Difficulty of Paying Living Expenses:   Food Insecurity:   . Worried About Programme researcher, broadcasting/film/video in the Last Year:   . Barista in  the Last Year:   Transportation Needs:   . Freight forwarder (Medical):   Marland Kitchen Lack of Transportation (Non-Medical):   Physical Activity:   . Days of Exercise per Week:   . Minutes of Exercise per Session:   Stress:   . Feeling of Stress :   Social Connections:   . Frequency of Communication with Friends and Family:   . Frequency of Social Gatherings with Friends and Family:   . Attends Religious Services:   . Active Member of Clubs or Organizations:   . Attends Banker Meetings:   Marland Kitchen Marital Status:   Intimate Partner Violence:   . Fear of Current or Ex-Partner:   . Emotionally Abused:   Marland Kitchen Physically Abused:   . Sexually Abused:       FAMILY HISTORY: Family History  Problem Relation Age of Onset  . Cancer Mother        liver  . Diabetes Mother   . Cancer Father        prostate,METS, stomach      ALLERGIES:   is allergic to plaquenil [hydroxychloroquine sulfate]; azithromycin; lactose intolerance (gi); and penicillin g.   MEDICATIONS:  Current Outpatient Medications  Medication Sig Dispense Refill  . azaTHIOprine (IMURAN) 50 MG tablet Take 50 mg by mouth daily.    . eszopiclone (LUNESTA) 2 MG TABS tablet Take 1 tablet (2 mg total) by mouth at bedtime as needed for sleep. Take immediately before bedtime 30 tablet 5  . losartan-hydrochlorothiazide (HYZAAR) 100-25 MG tablet TAKE 1 TABLET BY MOUTH DAILY 30 tablet 5   No current facility-administered medications for this visit.     REVIEW OF SYSTEMS:   A 10+ POINT REVIEW OF SYSTEMS WAS OBTAINED including neurology, dermatology, psychiatry, cardiac, respiratory, lymph, extremities, GI, GU, Musculoskeletal, constitutional, breasts, reproductive, HEENT.  All pertinent positives are noted in the HPI.  All others are negative.    PHYSICAL EXAMINATION: ECOG PERFORMANCE STATUS: 1 - Symptomatic but completely ambulatory  Vitals:   03/23/19 0853  BP: (!) 158/105  Pulse: 79  Resp: 18  Temp: (!) 97.3 F (36.3 C)  SpO2: 100%   Filed Weights   03/23/19 0853  Weight: 193 lb (87.5 kg)   Body mass index is 33.13 kg/m.     GENERAL:alert, in no acute distress and comfortable SKIN: no acute rashes, no significant lesions EYES: conjunctiva are pink and non-injected, sclera anicteric OROPHARYNX: MMM, no exudates, no oropharyngeal erythema or ulceration NECK: supple, no JVD LYMPH:  no palpable lymphadenopathy in the cervical, axillary or inguinal regions LUNGS: clear to auscultation b/l with normal respiratory effort HEART: regular rate & rhythm ABDOMEN:  normoactive bowel sounds , non tender, not distended. Extremity: no pedal edema PSYCH: alert & oriented  x 3 with fluent speech NEURO: no focal motor/sensory deficits   LABORATORY DATA:  I have reviewed the data as listed  CBC Latest Ref Rng & Units 03/23/2019 03/09/2019 09/06/2018  WBC 4.0 - 10.5 K/uL 3.1(L) 3.7(L) 2.4  Hemoglobin 12.0 - 15.0 g/dL 63.7 85.8 10.5(A)  Hematocrit 36.0 - 46.0 % 36.9 40.1 32(A)  Platelets 150 - 400 K/uL 43(L) 29(L) 62(A)    CMP Latest Ref Rng & Units 03/23/2019 03/09/2019 09/06/2018  Glucose 70 - 99 mg/dL 99 850(Y) -  BUN 8 - 23 mg/dL 13 17 18   Creatinine 0.44 - 1.00 mg/dL 7.74 -  Sodium 1.28 - 145 mmol/L 142 140 147  Potassium 3.5 - 5.1 mmol/L 4.1 3.7 -  Chloride 98 -  111 mmol/L 107 103 -  CO2 22 - 32 mmol/L 26 30 -  Calcium 8.9 - 10.3 mg/dL 9.0 9.8 -  Total Protein 6.5 - 8.1 g/dL 7.9 8.7(H) -  Total Bilirubin 0.3 - 1.2 mg/dL 0.4 0.6 -  Alkaline Phos 38 - 126 U/L 105 116 143(A)  AST 15 - 41 U/L 20 23 17   ALT 0 - 44 U/L 9 16 15    . Lab Results  Component Value Date   LDH 186 03/09/2019    Component     Latest Ref Rng & Units 03/09/2019  Retic Ct Pct     0.4 - 3.1 % 1.4  RBC.     3.87 - 5.11 MIL/uL 4.44  Retic Count, Absolute     19.0 - 186.0 K/uL 59.9  Immature Retic Fract     2.3 - 15.9 % 13.0  Beta-2 Glycoprotein I Ab, IgG     0 - 20 GPI IgG units <9  Beta-2-Glycoprotein I IgM     0 - 32 GPI IgM units <9  Beta-2-Glycoprotein I IgA     0 - 25 GPI IgA units <9  PTT Lupus Anticoagulant     0.0 - 51.9 sec 47.4  DRVVT     0.0 - 47.0 sec 57.5 (H)  Lupus Anticoag Interp      Comment:  Anticardiolipin Ab,IgG,Qn     0 - 14 GPL U/mL <9  Anticardiolipin Ab,IgM,Qn     0 - 12 MPL U/mL <9  Anticardiolipin Ab,IgA,Qn     0 - 11 APL U/mL <9  Vitamin B12     180 - 914 pg/mL 203  Hepatitis B Surface Ag     NON REACTIVE NON REACTIVE  Hep B Core Total Ab     NON REACTIVE NON REACTIVE  HCV Ab     NON REACTIVE NON REACTIVE  Platelet CT in Citrate      PLATELET COUNT PERFORMED ON CITRATED BLOOD  Immature Platelet Fraction     1.2 - 8.6 % 17.2 (H)   LDH     98 - 192 U/L 186   Lupus anticoagulant panel      The value has a corrected status.      No reference range information available      Resulting Lab: [facility]      Comments:           (NOTE)           Results are consistent with the presence of a lupus            anticoagulant.     RADIOGRAPHIC STUDIES: I have personally reviewed the radiological images as listed and agreed with the findings in the report. No results found.   ASSESSMENT & PLAN:   Elaine Allen is a 65 y.o. female with:  1. Thrombocytopenia - likely related to Immune thrombocytopenia in the setting of lupus. No evidence of TTP  Partly thrombocytopenia could be related to Imuran. Platelet counts seem to have dropped in tandem with decreasing doses of steroids.  2. Lupus - on Imuran  3. H/o AVN - related to steroids s/p left THA.  4. Lupus anticoagulant positive  PLAN: A&P: -Discussed pt labwork today, 03/23/19; of CBC w/diff and CMP is as follows: all values are WNL except for WBC at 3.1K, Platelet count at 43K, Neutro Abs at 0.9K. -Discussed 03/09/2019 of LDH at 186 -Discussed 03/09/2019 of dRVVT Confirm at 1.5 -Discussed 03/09/2019 of dRVVT  Mix at 44.9 -Discussed 03/09/2019 of Reticulocytes: all values are WNL -Discussed 03/09/2019 of Beta-2-glycoprotein I abs, IgG/M/A: all values are WNL -Discussed 03/09/2019 of Cardiolipin antibodies, IgG, IgM, IgA: all values are WNL -Discussed 03/09/2019 of Vitamin B12 at 203 -Discussed 03/09/2019 of Hepatiti B Surface antigen: all values are WNL -Discussed 03/09/2019 of Hepatitis B Core Antibody total: all values are WNL -Discussed 03/09/2019 of Hepatitis C antibody: all values are WNL -Discussed 03/09/2019 of Immature Platelet Fraction at 17.2 -Discussed reason for low platelets- ITP -Discussed ITP and TTP (do not have) -Discussed other treatment options such as splenecotomy, or N-plate or Promacta medication -Discussed slowly  taking off prednisone and how rituxan can affect Lupus  -Will take OTC Vitamin B12 supplement  -Will Start 4 weekly doses of Rituxan and stop Imuran once Rituxan has started. -Will continue taking 5mg  of Prednisone every other day as per Rheumatology -Pt does not want to be on higher doses of prednisone  -Rituxan takes 6-8 weeks to take effect -F/u with rheumatologist about holding imuran    FOLLOW UP Chemo-counseling for Rituxan ASAP Plz schedule to start weekly Rituxan x 4 doses with labs (starting in 5-7 days) MD visit with 2nd dose of Rituxan for toxicity check    The total time spent in the appt was 30 minutes and more than 50% was on counseling and direct patient cares and co-ordinating new RItuxan treatment.  All of the patient's questions were answered with apparent satisfaction. The patient knows to call the clinic with any problems, questions or concerns.    MD MS AAHIVMS [facility] Hematology/Oncology Physician [facility]  (Office):       [phone] (Work cell):  [phone] (Fax):           [phone]  03/22/2019 8:24 PM  I, 03/24/2019 am acting as a Dorthula Perfect for Dr. Neurosurgeon.   .I have reviewed the above documentation for accuracy and completeness, and I agree with the above. Wyvonnia Lora MD

## 2019-03-23 ENCOUNTER — Inpatient Hospital Stay: Payer: Medicare Other | Admitting: Hematology

## 2019-03-23 ENCOUNTER — Other Ambulatory Visit: Payer: Self-pay

## 2019-03-23 ENCOUNTER — Inpatient Hospital Stay: Payer: Medicare Other

## 2019-03-23 VITALS — BP 158/105 | HR 79 | Temp 97.3°F | Resp 18 | Ht 64.0 in | Wt 193.0 lb

## 2019-03-23 DIAGNOSIS — D693 Immune thrombocytopenic purpura: Secondary | ICD-10-CM | POA: Diagnosis not present

## 2019-03-23 DIAGNOSIS — Z800 Family history of malignant neoplasm of digestive organs: Secondary | ICD-10-CM | POA: Diagnosis not present

## 2019-03-23 DIAGNOSIS — R0602 Shortness of breath: Secondary | ICD-10-CM | POA: Diagnosis not present

## 2019-03-23 DIAGNOSIS — R040 Epistaxis: Secondary | ICD-10-CM | POA: Diagnosis not present

## 2019-03-23 DIAGNOSIS — Z79899 Other long term (current) drug therapy: Secondary | ICD-10-CM | POA: Diagnosis not present

## 2019-03-23 DIAGNOSIS — E538 Deficiency of other specified B group vitamins: Secondary | ICD-10-CM | POA: Diagnosis not present

## 2019-03-23 DIAGNOSIS — Z5112 Encounter for antineoplastic immunotherapy: Secondary | ICD-10-CM | POA: Diagnosis not present

## 2019-03-23 DIAGNOSIS — D696 Thrombocytopenia, unspecified: Secondary | ICD-10-CM

## 2019-03-23 DIAGNOSIS — Z87442 Personal history of urinary calculi: Secondary | ICD-10-CM | POA: Diagnosis not present

## 2019-03-23 DIAGNOSIS — Z833 Family history of diabetes mellitus: Secondary | ICD-10-CM | POA: Diagnosis not present

## 2019-03-23 DIAGNOSIS — R42 Dizziness and giddiness: Secondary | ICD-10-CM | POA: Diagnosis not present

## 2019-03-23 DIAGNOSIS — I10 Essential (primary) hypertension: Secondary | ICD-10-CM | POA: Diagnosis not present

## 2019-03-23 DIAGNOSIS — M329 Systemic lupus erythematosus, unspecified: Secondary | ICD-10-CM | POA: Diagnosis not present

## 2019-03-23 DIAGNOSIS — D6862 Lupus anticoagulant syndrome: Secondary | ICD-10-CM | POA: Diagnosis not present

## 2019-03-23 DIAGNOSIS — Z8673 Personal history of transient ischemic attack (TIA), and cerebral infarction without residual deficits: Secondary | ICD-10-CM | POA: Diagnosis not present

## 2019-03-23 LAB — CMP (CANCER CENTER ONLY)
ALT: 9 U/L (ref 0–44)
AST: 20 U/L (ref 15–41)
Albumin: 3.7 g/dL (ref 3.5–5.0)
Alkaline Phosphatase: 105 U/L (ref 38–126)
Anion gap: 9 (ref 5–15)
BUN: 13 mg/dL (ref 8–23)
CO2: 26 mmol/L (ref 22–32)
Calcium: 9 mg/dL (ref 8.9–10.3)
Chloride: 107 mmol/L (ref 98–111)
Creatinine: 0.82 mg/dL (ref 0.44–1.00)
GFR, Est AFR Am: >60 mL/min (ref >60)
GFR, Estimated: >60 mL/min (ref >60)
Glucose, Bld: 99 mg/dL (ref 70–99)
Potassium: 4.1 mmol/L (ref 3.5–5.1)
Sodium: 142 mmol/L (ref 135–145)
Total Bilirubin: 0.4 mg/dL (ref 0.3–1.2)
Total Protein: 7.9 g/dL (ref 6.5–8.1)

## 2019-03-23 LAB — CBC WITH DIFFERENTIAL (CANCER CENTER ONLY)
Abs Immature Granulocytes: 0 10*3/uL (ref 0.00–0.07)
Basophils Absolute: 0 10*3/uL (ref 0.0–0.1)
Basophils Relative: 0 %
Eosinophils Absolute: 0.1 10*3/uL (ref 0.0–0.5)
Eosinophils Relative: 3 %
HCT: 36.9 % (ref 36.0–46.0)
Hemoglobin: 12 g/dL (ref 12.0–15.0)
Immature Granulocytes: 0 %
Lymphocytes Relative: 55 %
Lymphs Abs: 1.7 10*3/uL (ref 0.7–4.0)
MCH: 29.6 pg (ref 26.0–34.0)
MCHC: 32.5 g/dL (ref 30.0–36.0)
MCV: 90.9 fL (ref 80.0–100.0)
Monocytes Absolute: 0.4 10*3/uL (ref 0.1–1.0)
Monocytes Relative: 13 %
Neutro Abs: 0.9 10*3/uL — ABNORMAL LOW (ref 1.7–7.7)
Neutrophils Relative %: 29 %
Platelet Count: 43 10*3/uL — ABNORMAL LOW (ref 150–400)
RBC: 4.06 MIL/uL (ref 3.87–5.11)
RDW: 14.1 % (ref 11.5–15.5)
WBC Count: 3.1 10*3/uL — ABNORMAL LOW (ref 4.0–10.5)
nRBC: 0 % (ref 0.0–0.2)

## 2019-03-24 ENCOUNTER — Telehealth: Payer: Self-pay | Admitting: Hematology

## 2019-03-24 NOTE — Telephone Encounter (Signed)
Scheduled per 03/18 los, called patient and left a voicemail regarding upcoming appointments.

## 2019-03-26 DIAGNOSIS — D693 Immune thrombocytopenic purpura: Secondary | ICD-10-CM | POA: Insufficient documentation

## 2019-03-26 NOTE — Progress Notes (Signed)
START OFF PATHWAY REGIMEN - Other   [id number]:Rituximab (IV/Subcut) D1 Weekly:   A cycle is every 7 days:     Rituximab-xxxx      Rituximab and hyaluronidase human   **Always confirm dose/schedule in your pharmacy ordering system**  Patient Characteristics: Intent of Therapy: Non-Curative / Palliative Intent, Discussed with Patient

## 2019-03-27 ENCOUNTER — Other Ambulatory Visit: Payer: Self-pay

## 2019-03-27 ENCOUNTER — Inpatient Hospital Stay: Payer: Medicare Other

## 2019-03-30 ENCOUNTER — Other Ambulatory Visit: Payer: Self-pay

## 2019-03-30 DIAGNOSIS — D693 Immune thrombocytopenic purpura: Secondary | ICD-10-CM

## 2019-03-31 ENCOUNTER — Other Ambulatory Visit: Payer: Self-pay

## 2019-03-31 ENCOUNTER — Inpatient Hospital Stay: Payer: Medicare Other

## 2019-03-31 VITALS — BP 153/85 | HR 86 | Temp 98.8°F | Resp 16

## 2019-03-31 DIAGNOSIS — Z87442 Personal history of urinary calculi: Secondary | ICD-10-CM | POA: Diagnosis not present

## 2019-03-31 DIAGNOSIS — D6862 Lupus anticoagulant syndrome: Secondary | ICD-10-CM | POA: Diagnosis not present

## 2019-03-31 DIAGNOSIS — R040 Epistaxis: Secondary | ICD-10-CM | POA: Diagnosis not present

## 2019-03-31 DIAGNOSIS — R42 Dizziness and giddiness: Secondary | ICD-10-CM | POA: Diagnosis not present

## 2019-03-31 DIAGNOSIS — Z8673 Personal history of transient ischemic attack (TIA), and cerebral infarction without residual deficits: Secondary | ICD-10-CM | POA: Diagnosis not present

## 2019-03-31 DIAGNOSIS — Z5112 Encounter for antineoplastic immunotherapy: Secondary | ICD-10-CM | POA: Diagnosis not present

## 2019-03-31 DIAGNOSIS — D693 Immune thrombocytopenic purpura: Secondary | ICD-10-CM

## 2019-03-31 DIAGNOSIS — M329 Systemic lupus erythematosus, unspecified: Secondary | ICD-10-CM | POA: Diagnosis not present

## 2019-03-31 DIAGNOSIS — Z833 Family history of diabetes mellitus: Secondary | ICD-10-CM | POA: Diagnosis not present

## 2019-03-31 DIAGNOSIS — Z79899 Other long term (current) drug therapy: Secondary | ICD-10-CM | POA: Diagnosis not present

## 2019-03-31 DIAGNOSIS — Z800 Family history of malignant neoplasm of digestive organs: Secondary | ICD-10-CM | POA: Diagnosis not present

## 2019-03-31 DIAGNOSIS — I10 Essential (primary) hypertension: Secondary | ICD-10-CM | POA: Diagnosis not present

## 2019-03-31 DIAGNOSIS — R0602 Shortness of breath: Secondary | ICD-10-CM | POA: Diagnosis not present

## 2019-03-31 LAB — CMP (CANCER CENTER ONLY)
ALT: 12 U/L (ref 0–44)
AST: 20 U/L (ref 15–41)
Albumin: 3.7 g/dL (ref 3.5–5.0)
Alkaline Phosphatase: 112 U/L (ref 38–126)
Anion gap: 8 (ref 5–15)
BUN: 13 mg/dL (ref 8–23)
CO2: 24 mmol/L (ref 22–32)
Calcium: 9.4 mg/dL (ref 8.9–10.3)
Chloride: 108 mmol/L (ref 98–111)
Creatinine: 0.89 mg/dL (ref 0.44–1.00)
GFR, Est AFR Am: >60 mL/min (ref >60)
GFR, Estimated: >60 mL/min (ref >60)
Glucose, Bld: 136 mg/dL — ABNORMAL HIGH (ref 70–99)
Potassium: 3.8 mmol/L (ref 3.5–5.1)
Sodium: 140 mmol/L (ref 135–145)
Total Bilirubin: 0.5 mg/dL (ref 0.3–1.2)
Total Protein: 8 g/dL (ref 6.5–8.1)

## 2019-03-31 LAB — CBC WITH DIFFERENTIAL (CANCER CENTER ONLY)
Abs Immature Granulocytes: 0.01 10*3/uL (ref 0.00–0.07)
Basophils Absolute: 0 10*3/uL (ref 0.0–0.1)
Basophils Relative: 0 %
Eosinophils Absolute: 0.1 10*3/uL (ref 0.0–0.5)
Eosinophils Relative: 2 %
HCT: 38.3 % (ref 36.0–46.0)
Hemoglobin: 12.8 g/dL (ref 12.0–15.0)
Immature Granulocytes: 0 %
Lymphocytes Relative: 51 %
Lymphs Abs: 1.3 10*3/uL (ref 0.7–4.0)
MCH: 29.6 pg (ref 26.0–34.0)
MCHC: 33.4 g/dL (ref 30.0–36.0)
MCV: 88.7 fL (ref 80.0–100.0)
Monocytes Absolute: 0.3 10*3/uL (ref 0.1–1.0)
Monocytes Relative: 12 %
Neutro Abs: 0.9 10*3/uL — ABNORMAL LOW (ref 1.7–7.7)
Neutrophils Relative %: 35 %
Platelet Count: 49 10*3/uL — ABNORMAL LOW (ref 150–400)
RBC: 4.32 MIL/uL (ref 3.87–5.11)
RDW: 13.7 % (ref 11.5–15.5)
WBC Count: 2.6 10*3/uL — ABNORMAL LOW (ref 4.0–10.5)
nRBC: 0 % (ref 0.0–0.2)

## 2019-03-31 LAB — LACTATE DEHYDROGENASE: LDH: 178 U/L (ref 98–192)

## 2019-03-31 MED ORDER — METHYLPREDNISOLONE SODIUM SUCC 125 MG IJ SOLR
80.0000 mg | Freq: Once | INTRAMUSCULAR | Status: AC
  Administered 2019-03-31: 80 mg via INTRAVENOUS

## 2019-03-31 MED ORDER — MONTELUKAST SODIUM 10 MG PO TABS
ORAL_TABLET | ORAL | Status: AC
  Filled 2019-03-31: qty 1

## 2019-03-31 MED ORDER — DIPHENHYDRAMINE HCL 50 MG/ML IJ SOLN
INTRAMUSCULAR | Status: AC
  Filled 2019-03-31: qty 1

## 2019-03-31 MED ORDER — ACETAMINOPHEN 325 MG PO TABS
650.0000 mg | ORAL_TABLET | Freq: Once | ORAL | Status: AC
  Administered 2019-03-31: 650 mg via ORAL

## 2019-03-31 MED ORDER — SODIUM CHLORIDE 0.9 % IV SOLN
375.0000 mg/m2 | Freq: Once | INTRAVENOUS | Status: AC
  Administered 2019-03-31: 700 mg via INTRAVENOUS
  Filled 2019-03-31: qty 50

## 2019-03-31 MED ORDER — FAMOTIDINE IN NACL 20-0.9 MG/50ML-% IV SOLN
INTRAVENOUS | Status: AC
  Filled 2019-03-31: qty 50

## 2019-03-31 MED ORDER — MONTELUKAST SODIUM 10 MG PO TABS
10.0000 mg | ORAL_TABLET | Freq: Once | ORAL | Status: AC
  Administered 2019-03-31: 10 mg via ORAL

## 2019-03-31 MED ORDER — ACETAMINOPHEN 325 MG PO TABS
ORAL_TABLET | ORAL | Status: AC
  Filled 2019-03-31: qty 2

## 2019-03-31 MED ORDER — METHYLPREDNISOLONE SODIUM SUCC 125 MG IJ SOLR
INTRAMUSCULAR | Status: AC
  Filled 2019-03-31: qty 2

## 2019-03-31 MED ORDER — FAMOTIDINE IN NACL 20-0.9 MG/50ML-% IV SOLN
20.0000 mg | Freq: Once | INTRAVENOUS | Status: AC
  Administered 2019-03-31: 20 mg via INTRAVENOUS

## 2019-03-31 MED ORDER — DIPHENHYDRAMINE HCL 50 MG/ML IJ SOLN
50.0000 mg | Freq: Once | INTRAMUSCULAR | Status: AC
  Administered 2019-03-31: 50 mg via INTRAVENOUS

## 2019-03-31 MED ORDER — SODIUM CHLORIDE 0.9 % IV SOLN
Freq: Once | INTRAVENOUS | Status: AC
  Filled 2019-03-31: qty 250

## 2019-03-31 NOTE — Patient Instructions (Signed)
[facility] Discharge Instructions for Patients Receiving Chemotherapy  Today you received the following chemotherapy agents: rituximab.  To help prevent nausea and vomiting after your treatment, we encourage you to take your nausea medication as directed.   If you develop nausea and vomiting that is not controlled by your nausea medication, call the clinic.   BELOW ARE SYMPTOMS THAT SHOULD BE REPORTED IMMEDIATELY:  *FEVER GREATER THAN 100.5 F  *CHILLS WITH OR WITHOUT FEVER  NAUSEA AND VOMITING THAT IS NOT CONTROLLED WITH YOUR NAUSEA MEDICATION  *UNUSUAL SHORTNESS OF BREATH  *UNUSUAL BRUISING OR BLEEDING  TENDERNESS IN MOUTH AND THROAT WITH OR WITHOUT PRESENCE OF ULCERS  *URINARY PROBLEMS  *BOWEL PROBLEMS  UNUSUAL RASH Items with * indicate a potential emergency and should be followed up as soon as possible.  Feel free to call the clinic should you have any questions or concerns. The clinic phone number is [phone].  Please show the CHEMO ALERT CARD at check-in to the Emergency Department and triage nurse.  Rituximab injection What is this medicine? RITUXIMAB (ri TUX i mab) is a monoclonal antibody. It is used to treat certain types of cancer like non-Hodgkin lymphoma and chronic lymphocytic leukemia. It is also used to treat rheumatoid arthritis, granulomatosis with polyangiitis (or Wegener's granulomatosis), microscopic polyangiitis, and pemphigus vulgaris. This medicine may be used for other purposes; ask your health care provider or pharmacist if you have questions. COMMON BRAND NAME(S): Rituxan, RUXIENCE What should I tell my health care provider before I take this medicine? They need to know if you have any of these conditions:  heart disease  infection (especially a virus infection such as hepatitis B, chickenpox, cold sores, or herpes)  immune system problems  irregular heartbeat  kidney disease  low blood counts, like low white cell,  platelet, or red cell counts  lung or breathing disease, like asthma  recently received or scheduled to receive a vaccine  an unusual or allergic reaction to rituximab, other medicines, foods, dyes, or preservatives  pregnant or trying to get pregnant  breast-feeding How should I use this medicine? This medicine is for infusion into a vein. It is administered in a hospital or clinic by a specially trained health care professional. A special MedGuide will be given to you by the pharmacist with each prescription and refill. Be sure to read this information carefully each time. Talk to your pediatrician regarding the use of this medicine in children. This medicine is not approved for use in children. Overdosage: If you think you have taken too much of this medicine contact a poison control center or emergency room at once. NOTE: This medicine is only for you. Do not share this medicine with others. What if I miss a dose? It is important not to miss a dose. Call your doctor or health care professional if you are unable to keep an appointment. What may interact with this medicine?  cisplatin  live virus vaccines This list may not describe all possible interactions. Give your health care provider a list of all the medicines, herbs, non-prescription drugs, or dietary supplements you use. Also tell them if you smoke, drink alcohol, or use illegal drugs. Some items may interact with your medicine. What should I watch for while using this medicine? Your condition will be monitored carefully while you are receiving this medicine. You may need blood work done while you are taking this medicine. This medicine can cause serious allergic reactions. To reduce your risk you may   need to take medicine before treatment with this medicine. Take your medicine as directed. In some patients, this medicine may cause a serious brain infection that may cause death. If you have any problems seeing, thinking,  speaking, walking, or standing, tell your healthcare professional right away. If you cannot reach your healthcare professional, urgently seek other source of medical care. Call your doctor or health care professional for advice if you get a fever, chills or sore throat, or other symptoms of a cold or flu. Do not treat yourself. This drug decreases your body's ability to fight infections. Try to avoid being around people who are sick. Do not become pregnant while taking this medicine or for at least 12 months after stopping it. Women should inform their doctor if they wish to become pregnant or think they might be pregnant. There is a potential for serious side effects to an unborn child. Talk to your health care professional or pharmacist for more information. Do not breast-feed an infant while taking this medicine or for at least 6 months after stopping it. What side effects may I notice from receiving this medicine? Side effects that you should report to your doctor or health care professional as soon as possible:  allergic reactions like skin rash, itching or hives; swelling of the face, lips, or tongue  breathing problems  chest pain  changes in vision  diarrhea  headache with fever, neck stiffness, sensitivity to light, nausea, or confusion  fast, irregular heartbeat  loss of memory  low blood counts - this medicine may decrease the number of white blood cells, red blood cells and platelets. You may be at increased risk for infections and bleeding.  mouth sores  problems with balance, talking, or walking  redness, blistering, peeling or loosening of the skin, including inside the mouth  signs of infection - fever or chills, cough, sore throat, pain or difficulty passing urine  signs and symptoms of kidney injury like trouble passing urine or change in the amount of urine  signs and symptoms of liver injury like dark yellow or brown urine; general ill feeling or flu-like  symptoms; light-colored stools; loss of appetite; nausea; right upper belly pain; unusually weak or tired; yellowing of the eyes or skin  signs and symptoms of low blood pressure like dizziness; feeling faint or lightheaded, falls; unusually weak or tired  stomach pain  swelling of the ankles, feet, hands  unusual bleeding or bruising  vomiting Side effects that usually do not require medical attention (report to your doctor or health care professional if they continue or are bothersome):  headache  joint pain  muscle cramps or muscle pain  nausea  tiredness This list may not describe all possible side effects. Call your doctor for medical advice about side effects. You may report side effects to FDA at 1-800-FDA-1088. Where should I keep my medicine? This drug is given in a hospital or clinic and will not be stored at home. NOTE: This sheet is a summary. It may not cover all possible information. If you have questions about this medicine, talk to your doctor, pharmacist, or health care provider.  2020 Elsevier/Gold Standard (2018-02-02 22:01:36)   

## 2019-04-03 NOTE — Progress Notes (Signed)
Rapid Infusion Rituximab Pharmacist Evaluation  Elaine Allen is a 64 y.o. female being treated with rituximab for ITP. This patient may be considered for RIR.   A pharmacist has verified the patient tolerated rituximab infusions per the [facility] standard infusion protocol without grade 3-4 infusion reactions. The treatment plan will be updated to reflect RIR if the patient qualifies per the checklist below:   Age > 75 years old Yes   Clinically significant cardiovascular disease No   Circulating lymphocyte count < 5000/uL prior to cycle two Yes  Lab Results  Component Value Date   LYMPHSABS 1.3 03/31/2019    Prior documented grade 3-4 infusion reaction to rituximab No   Prior documented grade 1-2 infusion reaction to rituximab (If YES, Pharmacist will confirm with Physician if patient is still a candidate for RIR) No   Previous rituximab infusion within the past 6 months Yes   Treatment Plan updated orders to reflect RIR Yes    Elaine Allen does meet the criteria for Rapid Infusion Rituximab. This patient is going to be switched to rapid infusion rituximab.   Elaine Allen [facility] 04/03/19 12:32 PM

## 2019-04-06 ENCOUNTER — Other Ambulatory Visit: Payer: Self-pay | Admitting: *Deleted

## 2019-04-06 DIAGNOSIS — D693 Immune thrombocytopenic purpura: Secondary | ICD-10-CM

## 2019-04-07 ENCOUNTER — Other Ambulatory Visit: Payer: Self-pay | Admitting: Oncology

## 2019-04-07 ENCOUNTER — Inpatient Hospital Stay: Payer: Medicare Other

## 2019-04-07 ENCOUNTER — Inpatient Hospital Stay: Payer: Medicare Other | Attending: Hematology

## 2019-04-07 ENCOUNTER — Other Ambulatory Visit: Payer: Self-pay | Admitting: Hematology and Oncology

## 2019-04-07 ENCOUNTER — Other Ambulatory Visit: Payer: Self-pay

## 2019-04-07 VITALS — BP 139/71 | HR 82 | Temp 98.0°F | Resp 18

## 2019-04-07 DIAGNOSIS — I10 Essential (primary) hypertension: Secondary | ICD-10-CM | POA: Diagnosis not present

## 2019-04-07 DIAGNOSIS — D693 Immune thrombocytopenic purpura: Secondary | ICD-10-CM | POA: Diagnosis not present

## 2019-04-07 DIAGNOSIS — Z79899 Other long term (current) drug therapy: Secondary | ICD-10-CM | POA: Insufficient documentation

## 2019-04-07 DIAGNOSIS — Z5112 Encounter for antineoplastic immunotherapy: Secondary | ICD-10-CM | POA: Diagnosis not present

## 2019-04-07 DIAGNOSIS — Z87442 Personal history of urinary calculi: Secondary | ICD-10-CM | POA: Insufficient documentation

## 2019-04-07 DIAGNOSIS — Z90710 Acquired absence of both cervix and uterus: Secondary | ICD-10-CM | POA: Diagnosis not present

## 2019-04-07 DIAGNOSIS — M329 Systemic lupus erythematosus, unspecified: Secondary | ICD-10-CM | POA: Diagnosis not present

## 2019-04-07 DIAGNOSIS — G2581 Restless legs syndrome: Secondary | ICD-10-CM | POA: Insufficient documentation

## 2019-04-07 DIAGNOSIS — Z8673 Personal history of transient ischemic attack (TIA), and cerebral infarction without residual deficits: Secondary | ICD-10-CM | POA: Diagnosis not present

## 2019-04-07 DIAGNOSIS — H5461 Unqualified visual loss, right eye, normal vision left eye: Secondary | ICD-10-CM | POA: Diagnosis not present

## 2019-04-07 LAB — CMP (CANCER CENTER ONLY)
ALT: 14 U/L (ref 0–44)
AST: 21 U/L (ref 15–41)
Albumin: 4 g/dL (ref 3.5–5.0)
Alkaline Phosphatase: 117 U/L (ref 38–126)
Anion gap: 11 (ref 5–15)
BUN: 14 mg/dL (ref 8–23)
CO2: 25 mmol/L (ref 22–32)
Calcium: 9.8 mg/dL (ref 8.9–10.3)
Chloride: 104 mmol/L (ref 98–111)
Creatinine: 0.88 mg/dL (ref 0.44–1.00)
GFR, Est AFR Am: >60 mL/min (ref >60)
GFR, Estimated: >60 mL/min (ref >60)
Glucose, Bld: 108 mg/dL — ABNORMAL HIGH (ref 70–99)
Potassium: 3.8 mmol/L (ref 3.5–5.1)
Sodium: 140 mmol/L (ref 135–145)
Total Bilirubin: 0.8 mg/dL (ref 0.3–1.2)
Total Protein: 8.6 g/dL — ABNORMAL HIGH (ref 6.5–8.1)

## 2019-04-07 LAB — CBC WITH DIFFERENTIAL (CANCER CENTER ONLY)
Abs Immature Granulocytes: 0.01 10*3/uL (ref 0.00–0.07)
Basophils Absolute: 0 10*3/uL (ref 0.0–0.1)
Basophils Relative: 0 %
Eosinophils Absolute: 0.1 10*3/uL (ref 0.0–0.5)
Eosinophils Relative: 3 %
HCT: 42.1 % (ref 36.0–46.0)
Hemoglobin: 14 g/dL (ref 12.0–15.0)
Immature Granulocytes: 0 %
Lymphocytes Relative: 44 %
Lymphs Abs: 1.3 10*3/uL (ref 0.7–4.0)
MCH: 29.1 pg (ref 26.0–34.0)
MCHC: 33.3 g/dL (ref 30.0–36.0)
MCV: 87.5 fL (ref 80.0–100.0)
Monocytes Absolute: 0.5 10*3/uL (ref 0.1–1.0)
Monocytes Relative: 17 %
Neutro Abs: 1.1 10*3/uL — ABNORMAL LOW (ref 1.7–7.7)
Neutrophils Relative %: 36 %
Platelet Count: 148 10*3/uL — ABNORMAL LOW (ref 150–400)
RBC: 4.81 MIL/uL (ref 3.87–5.11)
RDW: 14.2 % (ref 11.5–15.5)
WBC Count: 3.1 10*3/uL — ABNORMAL LOW (ref 4.0–10.5)
nRBC: 0 % (ref 0.0–0.2)

## 2019-04-07 LAB — LACTATE DEHYDROGENASE: LDH: 180 U/L (ref 98–192)

## 2019-04-07 MED ORDER — METHYLPREDNISOLONE SODIUM SUCC 125 MG IJ SOLR
INTRAMUSCULAR | Status: AC
  Filled 2019-04-07: qty 2

## 2019-04-07 MED ORDER — DIPHENHYDRAMINE HCL 50 MG/ML IJ SOLN
INTRAMUSCULAR | Status: AC
  Filled 2019-04-07: qty 1

## 2019-04-07 MED ORDER — DIPHENHYDRAMINE HCL 50 MG/ML IJ SOLN: 50.0000 mg | Freq: Once | INTRAMUSCULAR | Status: DC

## 2019-04-07 MED ORDER — ACETAMINOPHEN 325 MG PO TABS
ORAL_TABLET | ORAL | Status: AC
  Filled 2019-04-07: qty 2

## 2019-04-07 MED ORDER — DIPHENHYDRAMINE HCL 50 MG/ML IJ SOLN
25.0000 mg | Freq: Once | INTRAMUSCULAR | Status: AC
  Administered 2019-04-07: 25 mg via INTRAVENOUS

## 2019-04-07 MED ORDER — ACETAMINOPHEN 325 MG PO TABS
650.0000 mg | ORAL_TABLET | Freq: Once | ORAL | Status: AC
  Administered 2019-04-07: 650 mg via ORAL

## 2019-04-07 MED ORDER — FAMOTIDINE IN NACL 20-0.9 MG/50ML-% IV SOLN
20.0000 mg | Freq: Once | INTRAVENOUS | Status: AC
  Administered 2019-04-07: 20 mg via INTRAVENOUS

## 2019-04-07 MED ORDER — SODIUM CHLORIDE 0.9 % IV SOLN
375.0000 mg/m2 | Freq: Once | INTRAVENOUS | Status: AC
  Administered 2019-04-07: 700 mg via INTRAVENOUS
  Filled 2019-04-07: qty 20

## 2019-04-07 MED ORDER — LORAZEPAM 2 MG/ML IJ SOLN: 0.5000 mg | Freq: Once | INTRAMUSCULAR | Status: DC | PRN

## 2019-04-07 MED ORDER — FAMOTIDINE IN NACL 20-0.9 MG/50ML-% IV SOLN
INTRAVENOUS | Status: AC
  Filled 2019-04-07: qty 50

## 2019-04-07 MED ORDER — MONTELUKAST SODIUM 10 MG PO TABS
10.0000 mg | ORAL_TABLET | Freq: Once | ORAL | Status: AC
  Administered 2019-04-07: 10 mg via ORAL

## 2019-04-07 MED ORDER — SODIUM CHLORIDE 0.9 % IV SOLN
Freq: Once | INTRAVENOUS | Status: AC
  Filled 2019-04-07: qty 250

## 2019-04-07 MED ORDER — MONTELUKAST SODIUM 10 MG PO TABS
ORAL_TABLET | ORAL | Status: AC
  Filled 2019-04-07: qty 1

## 2019-04-07 MED ORDER — METHYLPREDNISOLONE SODIUM SUCC 125 MG IJ SOLR
80.0000 mg | Freq: Once | INTRAMUSCULAR | Status: AC
  Administered 2019-04-07: 80 mg via INTRAVENOUS

## 2019-04-07 NOTE — Patient Instructions (Signed)
[facility] Discharge Instructions for Patients Receiving Chemotherapy  Today you received the following chemotherapy agents: rituximab.  To help prevent nausea and vomiting after your treatment, we encourage you to take your nausea medication as directed.   If you develop nausea and vomiting that is not controlled by your nausea medication, call the clinic.   BELOW ARE SYMPTOMS THAT SHOULD BE REPORTED IMMEDIATELY:  *FEVER GREATER THAN 100.5 F  *CHILLS WITH OR WITHOUT FEVER  NAUSEA AND VOMITING THAT IS NOT CONTROLLED WITH YOUR NAUSEA MEDICATION  *UNUSUAL SHORTNESS OF BREATH  *UNUSUAL BRUISING OR BLEEDING  TENDERNESS IN MOUTH AND THROAT WITH OR WITHOUT PRESENCE OF ULCERS  *URINARY PROBLEMS  *BOWEL PROBLEMS  UNUSUAL RASH Items with * indicate a potential emergency and should be followed up as soon as possible.  Feel free to call the clinic should you have any questions or concerns. The clinic phone number is [phone].  Please show the CHEMO ALERT CARD at check-in to the Emergency Department and triage nurse.   

## 2019-04-10 NOTE — Progress Notes (Signed)
Pharmacist Chemotherapy Monitoring - Follow Up Assessment    I verify that I have reviewed each item in the below checklist:  . Regimen for the patient is scheduled for the appropriate day and plan matches scheduled date. Marland Kitchen Appropriate non-routine labs are ordered dependent on drug ordered. . If applicable, additional medications reviewed and ordered per protocol based on lifetime cumulative doses and/or treatment regimen.   Plan for follow-up and/or issues identified: No . I-vent associated with next due treatment: No . MD and/or nursing notified: No  Tayron Hunnell K 04/10/2019 12:47 PM

## 2019-04-13 ENCOUNTER — Other Ambulatory Visit: Payer: Self-pay | Admitting: *Deleted

## 2019-04-13 DIAGNOSIS — D693 Immune thrombocytopenic purpura: Secondary | ICD-10-CM

## 2019-04-13 NOTE — Progress Notes (Signed)
HEMATOLOGY/ONCOLOGY CLINIC NOTE  Date of Service: 04/14/2019  Patient Care Team: Myrlene Broker, MD as PCP - General (Internal Medicine)  REFERRING PHYSICIAN: Myrlene Broker, *  CHIEF COMPLAINTS/PURPOSE OF CONSULTATION:  F/u for mx of ITP   INTERVAL HISTORY  Elaine Allen is a wonderful 64 y.o. female who has been referred to [place] by Myrlene Broker, * for evaluation and management of Thrombocytopenia. The patient's last visit with [place] was on 03/23/19. The pt reports that she is doing well overall.  The pt reports she is good. She is tolerating Rituxan well. Pt's joint pain has been about a 7 in the hands and knees. She has had improvement in fatigue. Pt is not taking prednisone.   Lab results today (04/14/19) of CBC w/diff and CMP is as follows: all values are WNL except for WBC at 3.4K, Neutro Abs at 1.6K, Glucose at 115. 04/14/19 of LDH at 173  On review of systems, pt reports joint pain in hands and feet and denies fever, chills, night sweats and any other symptoms.    MEDICAL HISTORY:  Past Medical History:  Diagnosis Date  . Arthritis   . Blind right eye    sees colors  . Chronic anemia   . Detached retina, right    corrected good for about 6 months sees peripheral colors only  . History of kidney stones   . Hypertension   . Hyperthyroidism   . Lupus (HCC)   . Pneumonia   . Retinal detachment   . Spasmodic dysphonia   . Stroke (HCC) 2015   no permanant defecits had weakness in left arm in hospital Had PT also  . Vertigo    Hx of preceding stroke     SURGICAL HISTORY: Past Surgical History:  Procedure Laterality Date  . ABDOMINAL HYSTERECTOMY    . BREAST LUMPECTOMY     left breast benign  . KNEE SURGERY Left   . THYROID SURGERY    . TOTAL HIP ARTHROPLASTY Left 06/27/2018   Procedure: TOTAL HIP ARTHROPLASTY ANTERIOR APPROACH;  Surgeon: Gean Birchwood, MD;  Location: [facility];  Service: Orthopedics;  Laterality: Left;     SOCIAL  HISTORY: Social History   Socioeconomic History  . Marital status: Single    Spouse name: Not on file  . Number of children: 2  . Years of education: college  . Highest education level: Not on file  Occupational History  . Occupation: options  Tobacco Use  . Smoking status: Never Smoker  . Smokeless tobacco: Never Used  Substance and Sexual Activity  . Alcohol use: No  . Drug use: No  . Sexual activity: Not Currently  Other Topics Concern  . Not on file  Social History Narrative  . Not on file   Social Determinants of Health   Financial Resource Strain:   . Difficulty of Paying Living Expenses:   Food Insecurity:   . Worried About Programme researcher, broadcasting/film/video in the Last Year:   . Barista in the Last Year:   Transportation Needs:   . Freight forwarder (Medical):   Marland Kitchen Lack of Transportation (Non-Medical):   Physical Activity:   . Days of Exercise per Week:   . Minutes of Exercise per Session:   Stress:   . Feeling of Stress :   Social Connections:   . Frequency of Communication with Friends and Family:   . Frequency of Social Gatherings with Friends and Family:   .  Attends Religious Services:   . Active Member of Clubs or Organizations:   . Attends Archivist Meetings:   Marland Kitchen Marital Status:   Intimate Partner Violence:   . Fear of Current or Ex-Partner:   . Emotionally Abused:   Marland Kitchen Physically Abused:   . Sexually Abused:      FAMILY HISTORY: Family History  Problem Relation Age of Onset  . Cancer Mother        liver  . Diabetes Mother   . Cancer Father        prostate,METS, stomach      ALLERGIES:   is allergic to plaquenil [hydroxychloroquine sulfate]; azithromycin; lactose intolerance (gi); and penicillin g.   MEDICATIONS:  Current Outpatient Medications  Medication Sig Dispense Refill  . eszopiclone (LUNESTA) 2 MG TABS tablet Take 1 tablet (2 mg total) by mouth at bedtime as needed for sleep. Take immediately before bedtime 30 tablet  5  . losartan-hydrochlorothiazide (HYZAAR) 100-25 MG tablet TAKE 1 TABLET BY MOUTH DAILY 30 tablet 5   No current facility-administered medications for this visit.     REVIEW OF SYSTEMS:   A 10+ POINT REVIEW OF SYSTEMS WAS OBTAINED including neurology, dermatology, psychiatry, cardiac, respiratory, lymph, extremities, GI, GU, Musculoskeletal, constitutional, breasts, reproductive, HEENT.  All pertinent positives are noted in the HPI.  All others are negative.   PHYSICAL EXAMINATION: ECOG PERFORMANCE STATUS: 1 - Symptomatic but completely ambulatory  Vitals:   04/14/19 1231  BP: 138/83  Pulse: 87  Resp: 18  Temp: 98 F (36.7 C)  SpO2: 100%   Filed Weights   04/14/19 1231  Weight: 188 lb 3.2 oz (85.4 kg)   Body mass index is 32.3 kg/m.   Exam given in chair   GENERAL:alert, in no acute distress and comfortable SKIN: no acute rashes, no significant lesions EYES: conjunctiva are pink and non-injected, sclera anicteric OROPHARYNX: MMM, no exudates, no oropharyngeal erythema or ulceration NECK: supple, no JVD LYMPH:  no palpable lymphadenopathy in the cervical, axillary or inguinal regions LUNGS: clear to auscultation b/l with normal respiratory effort HEART: regular rate & rhythm ABDOMEN:  normoactive bowel sounds , non tender, not distended. Extremity: no pedal edema PSYCH: alert & oriented x 3 with fluent speech NEURO: no focal motor/sensory deficits    LABORATORY DATA:  I have reviewed the data as listed  CBC Latest Ref Rng & Units 04/14/2019 04/07/2019 03/31/2019  WBC 4.0 - 10.5 K/uL 3.4(L) 3.1(L) 2.6(L)  Hemoglobin 12.0 - 15.0 g/dL 12.7 14.0 12.8  Hematocrit 36.0 - 46.0 % 38.3 42.1 38.3  Platelets 150 - 400 K/uL 184 148(L) 49(L)    CMP Latest Ref Rng & Units 04/14/2019 04/07/2019 03/31/2019  Glucose 70 - 99 mg/dL 115(H) 108(H) 136(H)  BUN 8 - 23 mg/dL 13 14 13   Creatinine 0.44 - 1.00 mg/dL 0.82 0.88 0.89  Sodium 135 - 145 mmol/L 140 140 140  Potassium 3.5 - 5.1  mmol/L 3.7 3.8 3.8  Chloride 98 - 111 mmol/L 108 104 108  CO2 22 - 32 mmol/L 25 25 24   Calcium 8.9 - 10.3 mg/dL 9.2 9.8 9.4  Total Protein 6.5 - 8.1 g/dL 7.9 8.6(H) 8.0  Total Bilirubin 0.3 - 1.2 mg/dL 0.7 0.8 0.5  Alkaline Phos 38 - 126 U/L 111 117 112  AST 15 - 41 U/L 19 21 20   ALT 0 - 44 U/L 13 14 12    . Lab Results  Component Value Date   LDH 173 04/14/2019    Component  Latest Ref Rng & Units 03/09/2019  Retic Ct Pct     0.4 - 3.1 % 1.4  RBC.     3.87 - 5.11 MIL/uL 4.44  Retic Count, Absolute     19.0 - 186.0 K/uL 59.9  Immature Retic Fract     2.3 - 15.9 % 13.0  Beta-2 Glycoprotein I Ab, IgG     0 - 20 GPI IgG units <9  Beta-2-Glycoprotein I IgM     0 - 32 GPI IgM units <9  Beta-2-Glycoprotein I IgA     0 - 25 GPI IgA units <9  PTT Lupus Anticoagulant     0.0 - 51.9 sec 47.4  DRVVT     0.0 - 47.0 sec 57.5 (H)  Lupus Anticoag Interp      Comment:  Anticardiolipin Ab,IgG,Qn     0 - 14 GPL U/mL <9  Anticardiolipin Ab,IgM,Qn     0 - 12 MPL U/mL <9  Anticardiolipin Ab,IgA,Qn     0 - 11 APL U/mL <9  Vitamin B12     180 - 914 pg/mL 203  Hepatitis B Surface Ag     NON REACTIVE NON REACTIVE  Hep B Core Total Ab     NON REACTIVE NON REACTIVE  HCV Ab     NON REACTIVE NON REACTIVE  Platelet CT in Citrate      PLATELET COUNT PERFORMED ON CITRATED BLOOD  Immature Platelet Fraction     1.2 - 8.6 % 17.2 (H)  LDH     98 - 192 U/L 186   Lupus anticoagulant panel      The value has a corrected status.      No reference range information available      Resulting Lab: [facility]      Comments:           (NOTE)           Results are consistent with the presence of a lupus            anticoagulant.     RADIOGRAPHIC STUDIES: I have personally reviewed the radiological images as listed and agreed with the findings in the report. No results found.   ASSESSMENT & PLAN:   DEONNE ROOKS is a 64 y.o. female with:  1. Thrombocytopenia -  likely related to Immune thrombocytopenia in the setting of lupus. No evidence of TTP  Partly thrombocytopenia could be related to Imuran. Platelet counts seem to have dropped in tandem with decreasing doses of steroids.  2. Lupus - on Imuran  3. H/o AVN - related to steroids s/p left THA.  4. Lupus anticoagulant positive  PLAN: -Discussed pt labwork today, 04/14/19; of CBC w/diff and CMP is as follows: all values are WNL except for WBC at 3.4K, Neutro Abs at 1.6K, Glucose at 115. -Discussed 04/14/19 of LDH at 173 -Will take OTC Vitamin B12 supplement  -no prohibitive toxicities from Rituxan (mild RLS with 1st dose- better with reduced  Dose of benadryl). Patient will proceed with 3rd weekly dose today. -Rituxan takes 6-8 weeks to take effect -Advised watching platelets after Rituxan  -Platelets have normalized  -Rituxan could hopefully help some with  Her Lupus symptoms  -F/u with rheumatologist -Recommends getting COVID19 vaccine  -Will see 1 month after last dose of Rituxan  FOLLOW UP RTC with Dr Candise Che with labs in 5 weeks F/u for 4th dose of Rituxan and labs in 1 week    The total  time spent in the appt was 30 minutes and more than 50% was on counseling and direct patient cares and Mx of Rituxan rx  All of the patient's questions were answered with apparent satisfaction. The patient knows to call the clinic with any problems, questions or concerns.   Wyvonnia Lora MD MS AAHIVMS [facility] Hematology/Oncology Physician [facility]  (Office):       [phone] (Work cell):  [phone] (Fax):           [phone]  04/14/2019 12:51 PM  I, Dorthula Perfect am acting as a Neurosurgeon for Dr. Wyvonnia Lora.   .I have reviewed the above documentation for accuracy and completeness, and I agree with the above. Johney Maine MD

## 2019-04-14 ENCOUNTER — Ambulatory Visit: Payer: Medicare Other

## 2019-04-14 ENCOUNTER — Other Ambulatory Visit: Payer: Self-pay

## 2019-04-14 ENCOUNTER — Inpatient Hospital Stay: Payer: Medicare Other

## 2019-04-14 ENCOUNTER — Inpatient Hospital Stay (HOSPITAL_BASED_OUTPATIENT_CLINIC_OR_DEPARTMENT_OTHER): Payer: Medicare Other | Admitting: Hematology

## 2019-04-14 ENCOUNTER — Other Ambulatory Visit: Payer: Medicare Other

## 2019-04-14 VITALS — BP 133/88 | HR 73 | Temp 98.4°F | Resp 17

## 2019-04-14 VITALS — BP 138/83 | HR 87 | Temp 98.0°F | Resp 18 | Ht 64.0 in | Wt 188.2 lb

## 2019-04-14 DIAGNOSIS — I10 Essential (primary) hypertension: Secondary | ICD-10-CM | POA: Diagnosis not present

## 2019-04-14 DIAGNOSIS — D693 Immune thrombocytopenic purpura: Secondary | ICD-10-CM

## 2019-04-14 DIAGNOSIS — Z8673 Personal history of transient ischemic attack (TIA), and cerebral infarction without residual deficits: Secondary | ICD-10-CM | POA: Diagnosis not present

## 2019-04-14 DIAGNOSIS — M329 Systemic lupus erythematosus, unspecified: Secondary | ICD-10-CM | POA: Diagnosis not present

## 2019-04-14 DIAGNOSIS — G2581 Restless legs syndrome: Secondary | ICD-10-CM | POA: Diagnosis not present

## 2019-04-14 DIAGNOSIS — Z87442 Personal history of urinary calculi: Secondary | ICD-10-CM | POA: Diagnosis not present

## 2019-04-14 DIAGNOSIS — H5461 Unqualified visual loss, right eye, normal vision left eye: Secondary | ICD-10-CM | POA: Diagnosis not present

## 2019-04-14 DIAGNOSIS — Z79899 Other long term (current) drug therapy: Secondary | ICD-10-CM | POA: Diagnosis not present

## 2019-04-14 DIAGNOSIS — Z5112 Encounter for antineoplastic immunotherapy: Secondary | ICD-10-CM | POA: Diagnosis not present

## 2019-04-14 LAB — CBC WITH DIFFERENTIAL (CANCER CENTER ONLY)
Abs Immature Granulocytes: 0.01 10*3/uL (ref 0.00–0.07)
Basophils Absolute: 0 10*3/uL (ref 0.0–0.1)
Basophils Relative: 0 %
Eosinophils Absolute: 0.1 10*3/uL (ref 0.0–0.5)
Eosinophils Relative: 2 %
HCT: 38.3 % (ref 36.0–46.0)
Hemoglobin: 12.7 g/dL (ref 12.0–15.0)
Immature Granulocytes: 0 %
Lymphocytes Relative: 34 %
Lymphs Abs: 1.2 10*3/uL (ref 0.7–4.0)
MCH: 29.7 pg (ref 26.0–34.0)
MCHC: 33.2 g/dL (ref 30.0–36.0)
MCV: 89.7 fL (ref 80.0–100.0)
Monocytes Absolute: 0.5 10*3/uL (ref 0.1–1.0)
Monocytes Relative: 15 %
Neutro Abs: 1.6 10*3/uL — ABNORMAL LOW (ref 1.7–7.7)
Neutrophils Relative %: 49 %
Platelet Count: 184 10*3/uL (ref 150–400)
RBC: 4.27 MIL/uL (ref 3.87–5.11)
RDW: 14.2 % (ref 11.5–15.5)
WBC Count: 3.4 10*3/uL — ABNORMAL LOW (ref 4.0–10.5)
nRBC: 0 % (ref 0.0–0.2)

## 2019-04-14 LAB — LACTATE DEHYDROGENASE: LDH: 173 U/L (ref 98–192)

## 2019-04-14 LAB — CMP (CANCER CENTER ONLY)
ALT: 13 U/L (ref 0–44)
AST: 19 U/L (ref 15–41)
Albumin: 3.6 g/dL (ref 3.5–5.0)
Alkaline Phosphatase: 111 U/L (ref 38–126)
Anion gap: 7 (ref 5–15)
BUN: 13 mg/dL (ref 8–23)
CO2: 25 mmol/L (ref 22–32)
Calcium: 9.2 mg/dL (ref 8.9–10.3)
Chloride: 108 mmol/L (ref 98–111)
Creatinine: 0.82 mg/dL (ref 0.44–1.00)
GFR, Est AFR Am: >60 mL/min (ref >60)
GFR, Estimated: >60 mL/min (ref >60)
Glucose, Bld: 115 mg/dL — ABNORMAL HIGH (ref 70–99)
Potassium: 3.7 mmol/L (ref 3.5–5.1)
Sodium: 140 mmol/L (ref 135–145)
Total Bilirubin: 0.7 mg/dL (ref 0.3–1.2)
Total Protein: 7.9 g/dL (ref 6.5–8.1)

## 2019-04-14 MED ORDER — DIPHENHYDRAMINE HCL 50 MG/ML IJ SOLN
25.0000 mg | Freq: Once | INTRAMUSCULAR | Status: AC
  Administered 2019-04-14: 25 mg via INTRAVENOUS

## 2019-04-14 MED ORDER — ACETAMINOPHEN 325 MG PO TABS
ORAL_TABLET | ORAL | Status: AC
  Filled 2019-04-14: qty 2

## 2019-04-14 MED ORDER — METHYLPREDNISOLONE SODIUM SUCC 125 MG IJ SOLR
INTRAMUSCULAR | Status: AC
  Filled 2019-04-14: qty 2

## 2019-04-14 MED ORDER — SODIUM CHLORIDE 0.9 % IV SOLN
Freq: Once | INTRAVENOUS | Status: AC
  Filled 2019-04-14: qty 250

## 2019-04-14 MED ORDER — FAMOTIDINE IN NACL 20-0.9 MG/50ML-% IV SOLN
INTRAVENOUS | Status: AC
  Filled 2019-04-14: qty 50

## 2019-04-14 MED ORDER — LORAZEPAM 2 MG/ML IJ SOLN
0.5000 mg | Freq: Once | INTRAMUSCULAR | Status: AC | PRN
  Administered 2019-04-14: 14:00:00 0.5 mg via INTRAVENOUS

## 2019-04-14 MED ORDER — LORAZEPAM 2 MG/ML IJ SOLN
INTRAMUSCULAR | Status: AC
  Filled 2019-04-14: qty 1

## 2019-04-14 MED ORDER — METHYLPREDNISOLONE SODIUM SUCC 125 MG IJ SOLR
80.0000 mg | Freq: Once | INTRAMUSCULAR | Status: AC
  Administered 2019-04-14: 80 mg via INTRAVENOUS

## 2019-04-14 MED ORDER — ACETAMINOPHEN 325 MG PO TABS
650.0000 mg | ORAL_TABLET | Freq: Once | ORAL | Status: AC
  Administered 2019-04-14: 650 mg via ORAL

## 2019-04-14 MED ORDER — FAMOTIDINE IN NACL 20-0.9 MG/50ML-% IV SOLN
20.0000 mg | Freq: Once | INTRAVENOUS | Status: AC
  Administered 2019-04-14: 20 mg via INTRAVENOUS

## 2019-04-14 MED ORDER — DIPHENHYDRAMINE HCL 50 MG/ML IJ SOLN
INTRAMUSCULAR | Status: AC
  Filled 2019-04-14: qty 1

## 2019-04-14 MED ORDER — SODIUM CHLORIDE 0.9 % IV SOLN
375.0000 mg/m2 | Freq: Once | INTRAVENOUS | Status: AC
  Administered 2019-04-14: 700 mg via INTRAVENOUS
  Filled 2019-04-14: qty 20

## 2019-04-14 MED ORDER — MONTELUKAST SODIUM 10 MG PO TABS
10.0000 mg | ORAL_TABLET | Freq: Once | ORAL | Status: AC
  Administered 2019-04-14: 10 mg via ORAL

## 2019-04-14 MED ORDER — MONTELUKAST SODIUM 10 MG PO TABS
ORAL_TABLET | ORAL | Status: AC
  Filled 2019-04-14: qty 1

## 2019-04-14 NOTE — Patient Instructions (Signed)
[facility] Discharge Instructions for Patients Receiving Chemotherapy  Today you received the following chemotherapy agents Rituxamab  To help prevent nausea and vomiting after your treatment, we encourage you to take your nausea medication as directed   If you develop nausea and vomiting that is not controlled by your nausea medication, call the clinic.   BELOW ARE SYMPTOMS THAT SHOULD BE REPORTED IMMEDIATELY:  *FEVER GREATER THAN 100.5 F  *CHILLS WITH OR WITHOUT FEVER  NAUSEA AND VOMITING THAT IS NOT CONTROLLED WITH YOUR NAUSEA MEDICATION  *UNUSUAL SHORTNESS OF BREATH  *UNUSUAL BRUISING OR BLEEDING  TENDERNESS IN MOUTH AND THROAT WITH OR WITHOUT PRESENCE OF ULCERS  *URINARY PROBLEMS  *BOWEL PROBLEMS  UNUSUAL RASH Items with * indicate a potential emergency and should be followed up as soon as possible.  Feel free to call the clinic should you have any questions or concerns. The clinic phone number is [phone].  Please show the CHEMO ALERT CARD at check-in to the Emergency Department and triage nurse.   

## 2019-04-17 NOTE — Progress Notes (Signed)
Pharmacist Chemotherapy Monitoring - Follow Up Assessment    I verify that I have reviewed each item in the below checklist:  . Regimen for the patient is scheduled for the appropriate day and plan matches scheduled date. Marland Kitchen Appropriate non-routine labs are ordered dependent on drug ordered. . If applicable, additional medications reviewed and ordered per protocol based on lifetime cumulative doses and/or treatment regimen.   Plan for follow-up and/or issues identified: No . I-vent associated with next due treatment: No . MD and/or nursing notified: No  Elaine Allen D 04/17/2019 2:07 PM

## 2019-04-21 ENCOUNTER — Inpatient Hospital Stay: Payer: Medicare Other

## 2019-04-21 ENCOUNTER — Other Ambulatory Visit: Payer: Self-pay

## 2019-04-21 VITALS — BP 146/81 | HR 75 | Temp 98.6°F | Resp 18 | Ht 64.0 in

## 2019-04-21 DIAGNOSIS — Z79899 Other long term (current) drug therapy: Secondary | ICD-10-CM | POA: Diagnosis not present

## 2019-04-21 DIAGNOSIS — M329 Systemic lupus erythematosus, unspecified: Secondary | ICD-10-CM | POA: Diagnosis not present

## 2019-04-21 DIAGNOSIS — I10 Essential (primary) hypertension: Secondary | ICD-10-CM | POA: Diagnosis not present

## 2019-04-21 DIAGNOSIS — D693 Immune thrombocytopenic purpura: Secondary | ICD-10-CM | POA: Diagnosis not present

## 2019-04-21 DIAGNOSIS — Z87442 Personal history of urinary calculi: Secondary | ICD-10-CM | POA: Diagnosis not present

## 2019-04-21 DIAGNOSIS — H5461 Unqualified visual loss, right eye, normal vision left eye: Secondary | ICD-10-CM | POA: Diagnosis not present

## 2019-04-21 DIAGNOSIS — G2581 Restless legs syndrome: Secondary | ICD-10-CM | POA: Diagnosis not present

## 2019-04-21 DIAGNOSIS — Z8673 Personal history of transient ischemic attack (TIA), and cerebral infarction without residual deficits: Secondary | ICD-10-CM | POA: Diagnosis not present

## 2019-04-21 DIAGNOSIS — Z5112 Encounter for antineoplastic immunotherapy: Secondary | ICD-10-CM | POA: Diagnosis not present

## 2019-04-21 LAB — CMP (CANCER CENTER ONLY)
ALT: 13 U/L (ref 0–44)
AST: 18 U/L (ref 15–41)
Albumin: 3.6 g/dL (ref 3.5–5.0)
Alkaline Phosphatase: 110 U/L (ref 38–126)
Anion gap: 12 (ref 5–15)
BUN: 12 mg/dL (ref 8–23)
CO2: 21 mmol/L — ABNORMAL LOW (ref 22–32)
Calcium: 9 mg/dL (ref 8.9–10.3)
Chloride: 111 mmol/L (ref 98–111)
Creatinine: 0.87 mg/dL (ref 0.44–1.00)
GFR, Est AFR Am: >60 mL/min (ref >60)
GFR, Estimated: >60 mL/min (ref >60)
Glucose, Bld: 123 mg/dL — ABNORMAL HIGH (ref 70–99)
Potassium: 3.7 mmol/L (ref 3.5–5.1)
Sodium: 144 mmol/L (ref 135–145)
Total Bilirubin: 0.7 mg/dL (ref 0.3–1.2)
Total Protein: 7.8 g/dL (ref 6.5–8.1)

## 2019-04-21 LAB — CBC WITH DIFFERENTIAL/PLATELET
Abs Immature Granulocytes: 0.01 10*3/uL (ref 0.00–0.07)
Basophils Absolute: 0 10*3/uL (ref 0.0–0.1)
Basophils Relative: 0 %
Eosinophils Absolute: 0.1 10*3/uL (ref 0.0–0.5)
Eosinophils Relative: 3 %
HCT: 39.1 % (ref 36.0–46.0)
Hemoglobin: 13 g/dL (ref 12.0–15.0)
Immature Granulocytes: 0 %
Lymphocytes Relative: 39 %
Lymphs Abs: 1.3 10*3/uL (ref 0.7–4.0)
MCH: 29.5 pg (ref 26.0–34.0)
MCHC: 33.2 g/dL (ref 30.0–36.0)
MCV: 88.9 fL (ref 80.0–100.0)
Monocytes Absolute: 0.4 10*3/uL (ref 0.1–1.0)
Monocytes Relative: 13 %
Neutro Abs: 1.5 10*3/uL — ABNORMAL LOW (ref 1.7–7.7)
Neutrophils Relative %: 45 %
Platelets: 176 10*3/uL (ref 150–400)
RBC: 4.4 MIL/uL (ref 3.87–5.11)
RDW: 14.1 % (ref 11.5–15.5)
WBC: 3.3 10*3/uL — ABNORMAL LOW (ref 4.0–10.5)
nRBC: 0 % (ref 0.0–0.2)

## 2019-04-21 MED ORDER — MONTELUKAST SODIUM 10 MG PO TABS
ORAL_TABLET | ORAL | Status: AC
  Filled 2019-04-21: qty 1

## 2019-04-21 MED ORDER — FAMOTIDINE IN NACL 20-0.9 MG/50ML-% IV SOLN
INTRAVENOUS | Status: AC
  Filled 2019-04-21: qty 50

## 2019-04-21 MED ORDER — ACETAMINOPHEN 325 MG PO TABS
650.0000 mg | ORAL_TABLET | Freq: Once | ORAL | Status: AC
  Administered 2019-04-21: 650 mg via ORAL

## 2019-04-21 MED ORDER — METHYLPREDNISOLONE SODIUM SUCC 125 MG IJ SOLR
80.0000 mg | Freq: Once | INTRAMUSCULAR | Status: AC
  Administered 2019-04-21: 80 mg via INTRAVENOUS

## 2019-04-21 MED ORDER — SODIUM CHLORIDE 0.9 % IV SOLN
Freq: Once | INTRAVENOUS | Status: AC
  Filled 2019-04-21: qty 250

## 2019-04-21 MED ORDER — SODIUM CHLORIDE 0.9 % IV SOLN
375.0000 mg/m2 | Freq: Once | INTRAVENOUS | Status: AC
  Administered 2019-04-21: 700 mg via INTRAVENOUS
  Filled 2019-04-21: qty 50

## 2019-04-21 MED ORDER — MONTELUKAST SODIUM 10 MG PO TABS
10.0000 mg | ORAL_TABLET | Freq: Once | ORAL | Status: AC
  Administered 2019-04-21: 10 mg via ORAL

## 2019-04-21 MED ORDER — ACETAMINOPHEN 325 MG PO TABS
ORAL_TABLET | ORAL | Status: AC
  Filled 2019-04-21: qty 2

## 2019-04-21 MED ORDER — METHYLPREDNISOLONE SODIUM SUCC 125 MG IJ SOLR
INTRAMUSCULAR | Status: AC
  Filled 2019-04-21: qty 2

## 2019-04-21 MED ORDER — LORAZEPAM 2 MG/ML IJ SOLN
0.5000 mg | Freq: Once | INTRAMUSCULAR | Status: AC | PRN
  Administered 2019-04-21: 0.5 mg via INTRAVENOUS

## 2019-04-21 MED ORDER — LORAZEPAM 2 MG/ML IJ SOLN
INTRAMUSCULAR | Status: AC
  Filled 2019-04-21: qty 1

## 2019-04-21 MED ORDER — FAMOTIDINE IN NACL 20-0.9 MG/50ML-% IV SOLN
20.0000 mg | Freq: Once | INTRAVENOUS | Status: AC
  Administered 2019-04-21: 20 mg via INTRAVENOUS

## 2019-04-21 MED ORDER — DIPHENHYDRAMINE HCL 50 MG/ML IJ SOLN
25.0000 mg | Freq: Once | INTRAMUSCULAR | Status: AC
  Administered 2019-04-21: 25 mg via INTRAVENOUS

## 2019-04-21 MED ORDER — DIPHENHYDRAMINE HCL 50 MG/ML IJ SOLN
INTRAMUSCULAR | Status: AC
  Filled 2019-04-21: qty 1

## 2019-04-21 NOTE — Patient Instructions (Signed)
[facility] Discharge Instructions for Patients Receiving Chemotherapy  Today you received the following chemotherapy agents:  Rituxan.  To help prevent nausea and vomiting after your treatment, we encourage you to take your nausea medication as directed.   If you develop nausea and vomiting that is not controlled by your nausea medication, call the clinic.   BELOW ARE SYMPTOMS THAT SHOULD BE REPORTED IMMEDIATELY:  *FEVER GREATER THAN 100.5 F  *CHILLS WITH OR WITHOUT FEVER  NAUSEA AND VOMITING THAT IS NOT CONTROLLED WITH YOUR NAUSEA MEDICATION  *UNUSUAL SHORTNESS OF BREATH  *UNUSUAL BRUISING OR BLEEDING  TENDERNESS IN MOUTH AND THROAT WITH OR WITHOUT PRESENCE OF ULCERS  *URINARY PROBLEMS  *BOWEL PROBLEMS  UNUSUAL RASH Items with * indicate a potential emergency and should be followed up as soon as possible.  Feel free to call the clinic should you have any questions or concerns. The clinic phone number is [phone].  Please show the CHEMO ALERT CARD at check-in to the Emergency Department and triage nurse.   

## 2019-05-08 DIAGNOSIS — R531 Weakness: Secondary | ICD-10-CM | POA: Diagnosis not present

## 2019-05-08 DIAGNOSIS — M791 Myalgia, unspecified site: Secondary | ICD-10-CM | POA: Diagnosis not present

## 2019-05-08 DIAGNOSIS — M2550 Pain in unspecified joint: Secondary | ICD-10-CM | POA: Diagnosis not present

## 2019-05-08 DIAGNOSIS — M329 Systemic lupus erythematosus, unspecified: Secondary | ICD-10-CM | POA: Diagnosis not present

## 2019-05-18 ENCOUNTER — Other Ambulatory Visit: Payer: Self-pay | Admitting: Internal Medicine

## 2019-05-18 DIAGNOSIS — Z1231 Encounter for screening mammogram for malignant neoplasm of breast: Secondary | ICD-10-CM

## 2019-05-19 ENCOUNTER — Other Ambulatory Visit: Payer: Self-pay

## 2019-05-19 ENCOUNTER — Ambulatory Visit
Admission: RE | Admit: 2019-05-19 | Discharge: 2019-05-19 | Disposition: A | Discharge status: Home / Self Care | Payer: Medicare Other | Source: Ambulatory Visit

## 2019-05-19 DIAGNOSIS — Z1231 Encounter for screening mammogram for malignant neoplasm of breast: Secondary | ICD-10-CM | POA: Diagnosis not present

## 2019-06-13 ENCOUNTER — Encounter: Payer: Self-pay | Admitting: Internal Medicine

## 2019-06-13 ENCOUNTER — Ambulatory Visit (INDEPENDENT_AMBULATORY_CARE_PROVIDER_SITE_OTHER): Payer: Medicare Other

## 2019-06-13 ENCOUNTER — Other Ambulatory Visit: Payer: Self-pay

## 2019-06-13 ENCOUNTER — Ambulatory Visit (INDEPENDENT_AMBULATORY_CARE_PROVIDER_SITE_OTHER): Payer: Medicare Other | Admitting: Internal Medicine

## 2019-06-13 VITALS — BP 148/96 | HR 105 | Temp 98.9°F | Ht 64.0 in | Wt 194.0 lb

## 2019-06-13 DIAGNOSIS — M545 Low back pain, unspecified: Secondary | ICD-10-CM

## 2019-06-13 DIAGNOSIS — G8929 Other chronic pain: Secondary | ICD-10-CM | POA: Diagnosis not present

## 2019-06-13 NOTE — Assessment & Plan Note (Signed)
Checking x-ray lumbar today given prednisone therapy and prior AVN femur. Depending on results may need MRI. Asked her to try muscle relaxers which she still has at home. If they are out of date she will let us know and we can prescribe.

## 2019-06-13 NOTE — Patient Instructions (Signed)
We will check an x-ray of the back today.  Try the muscle relaxer at home and let us know if you don't have any and we can send that in.

## 2019-06-13 NOTE — Progress Notes (Signed)
   Subjective:   Patient ID: Elaine Allen, female    DOB: [birth year], 64 y.o.   MRN: [id number]  HPI The patient is a 64 YO female coming in for pain in the low back starting about a month after her hip surgery last June. Started off as very intermittent. She would have a twinge every once in a while. In the last several months this has gotten more consistent. She gets pain while standing at work or doing dishes. Gets better with heating pad and resting. She started getting more lupus symptoms once started on rituxan by oncology and so saw her rheumatologist. She was having hand pain and swelling as well as the back problems. She was started back on prednisone by rheumatology and this did help with her hand pain and swelling but not with the back pain. She has also tried ibuprofen and this does not help with the pain in her back. Overall worsening. No injury or overuse. Pain stays in the low back and does not radiate to the legs or up the back at all.   Review of Systems  Constitutional: Negative.   HENT: Negative.   Eyes: Negative.   Respiratory: Negative for cough, chest tightness and shortness of breath.   Cardiovascular: Negative for chest pain, palpitations and leg swelling.  Gastrointestinal: Negative for abdominal distention, abdominal pain, constipation, diarrhea, nausea and vomiting.  Musculoskeletal: Positive for arthralgias and back pain.  Skin: Negative.   Neurological: Negative.   Psychiatric/Behavioral: Negative.     Objective:  Physical Exam Constitutional:      Appearance: She is well-developed.  HENT:     Head: Normocephalic and atraumatic.  Cardiovascular:     Rate and Rhythm: Normal rate and regular rhythm.  Pulmonary:     Effort: Pulmonary effort is normal. No respiratory distress.     Breath sounds: Normal breath sounds. No wheezing or rales.  Abdominal:     General: Bowel sounds are normal. There is no distension.     Palpations: Abdomen is soft.   Tenderness: There is no abdominal tenderness. There is no rebound.  Musculoskeletal:        General: Tenderness present.     Cervical back: Normal range of motion.     Comments: Lumbar spinal tenderness and paraspinal tenderness as well.   Skin:    General: Skin is warm and dry.  Neurological:     Mental Status: She is alert and oriented to person, place, and time.     Coordination: Coordination normal.     Vitals:   06/13/19 0922  BP: (!) 148/96  Pulse: (!) 105  Temp: 98.9 F (37.2 C)  TempSrc: Oral  SpO2: 96%  Weight: 194 lb (88 kg)  Height: 5\' 4"  (1.626 m)    This visit occurred during the SARS-CoV-2 public health emergency.  Safety protocols were in place, including screening questions prior to the visit, additional usage of staff PPE, and extensive cleaning of exam room while observing appropriate contact time as indicated for disinfecting solutions.   Assessment & Plan:

## 2019-06-27 ENCOUNTER — Other Ambulatory Visit: Payer: Self-pay | Admitting: Internal Medicine

## 2019-08-01 ENCOUNTER — Encounter: Payer: Self-pay | Admitting: Internal Medicine

## 2019-08-18 NOTE — Telephone Encounter (Signed)
   Scheduler left message vcml to call for appointment 

## 2019-10-13 ENCOUNTER — Telehealth: Payer: Self-pay | Admitting: Internal Medicine

## 2019-10-13 DIAGNOSIS — M329 Systemic lupus erythematosus, unspecified: Secondary | ICD-10-CM

## 2019-10-13 NOTE — Telephone Encounter (Signed)
Ok this is done 

## 2019-10-13 NOTE — Addendum Note (Signed)
Addended by: Corwin Levins on: 10/13/2019 01:05 PM   Modules accepted: Orders

## 2019-10-13 NOTE — Telephone Encounter (Signed)
    Patient requesting a referral to [facility], Dr Lu Duffel Patient was not pleased with last Rheumatology provider, requesting new referral

## 2019-10-19 NOTE — Progress Notes (Deleted)
Office Visit Note  Patient: Elaine Allen             Date of Birth: 24-Jul-1955           MRN: [id number]             PCP: Myrlene Broker, MD Referring: Corwin Levins, MD Visit Date: 10/23/2019 Occupation: @GUAROCC @  Subjective:  No chief complaint on file.   History of Present Illness: Elaine Allen is a 64 y.o. female ***   Activities of Daily Living:  Patient reports morning stiffness for *** {minute/hour:19697}.   Patient {ACTIONS;DENIES/REPORTS:21021675::"Denies"} nocturnal pain.  Difficulty dressing/grooming: {ACTIONS;DENIES/REPORTS:21021675::"Denies"} Difficulty climbing stairs: {ACTIONS;DENIES/REPORTS:21021675::"Denies"} Difficulty getting out of chair: {ACTIONS;DENIES/REPORTS:21021675::"Denies"} Difficulty using hands for taps, buttons, cutlery, and/or writing: {ACTIONS;DENIES/REPORTS:21021675::"Denies"}  No Rheumatology ROS completed.   PMFS History:  Patient Active Problem List   Diagnosis Date Noted  . Immune thrombocytopenia (HCC) 03/26/2019  . Pre-op evaluation 06/24/2018  . AVN of femur (HCC) 06/23/2018  . Greater trochanteric bursitis of left hip 04/08/2018  . Left knee pain 04/06/2018  . Numbness in feet 09/14/2017  . Elevated LFTs 02/24/2017  . Pre-diabetes 02/19/2017  . Fatigue 02/19/2017  . OA (osteoarthritis) of hip 10/29/2016  . Numbness and tingling in left arm 05/05/2016  . Cough 12/25/2015  . Wheezing 12/25/2015  . Abdominal pain 12/25/2015  . Back pain 10/01/2015  . Insomnia 03/01/2015  . Family hx of colon cancer 11/12/2014  . Essential hypertension, benign 05/15/2013  . Other and unspecified hyperlipidemia 05/15/2013  . Vertebral artery dissection (HCC) 05/14/2013  . Hx of completed stroke 05/14/2013  . Chronic anemia   . OSTEOARTHRITIS, KNEE, RIGHT, SEVERE 12/19/2007  . Primary hyperparathyroidism (HCC) 11/24/2007  . OSTEOPENIA 11/24/2007  . Lupus (systemic lupus erythematosus) (HCC) 01/05/2002    Past Medical  History:  Diagnosis Date  . Arthritis   . Blind right eye    sees colors  . Chronic anemia   . Detached retina, right    corrected good for about 6 months sees peripheral colors only  . History of kidney stones   . Hypertension   . Hyperthyroidism   . Lupus (HCC)   . Pneumonia   . Retinal detachment   . Spasmodic dysphonia   . Stroke (HCC) 2015   no permanant defecits had weakness in left arm in hospital Had PT also  . Vertigo    Hx of preceding stroke    Family History  Problem Relation Age of Onset  . Cancer Mother        liver  . Diabetes Mother   . Cancer Father        prostate,METS, stomach    Past Surgical History:  Procedure Laterality Date  . ABDOMINAL HYSTERECTOMY    . BREAST LUMPECTOMY     left breast benign  . KNEE SURGERY Left   . THYROID SURGERY    . TOTAL HIP ARTHROPLASTY Left 06/27/2018   Procedure: TOTAL HIP ARTHROPLASTY ANTERIOR APPROACH;  Surgeon: 06/29/2018, MD;  Location: [facility];  Service: Orthopedics;  Laterality: Left;   Social History   Social History Narrative  . Not on file   Immunization History  Administered Date(s) Administered  . Fluad Quad(high Dose 65+) 10/21/2018  . Hep A / Hep B 03/08/2017  . Influenza Whole 12/19/2007  . Influenza,inj,Quad PF,6+ Mos 11/12/2014, 10/01/2015, 10/28/2016, 02/09/2018  . PPD Test 01/09/2015, 03/18/2016, 03/23/2016, 08/10/2017  . Pneumococcal Conjugate-13 10/21/2018  . Tdap 12/05/2014  . Zoster 01/02/2015  Objective: Vital Signs: There were no vitals taken for this visit.   Physical Exam   Musculoskeletal Exam: ***  CDAI Exam: CDAI Score: -- Patient Global: --; Provider Global: -- Swollen: --; Tender: -- Joint Exam 10/23/2019   No joint exam has been documented for this visit   There is currently no information documented on the homunculus. Go to the Rheumatology activity and complete the homunculus joint exam.  Investigation: No additional findings.  Imaging: No results  found.  Recent Labs: Lab Results  Component Value Date   WBC 3.3 (L) 04/21/2019   HGB 13.0 04/21/2019   PLT 176 04/21/2019   NA 144 04/21/2019   K 3.7 04/21/2019   CL 111 04/21/2019   CO2 21 (L) 04/21/2019   GLUCOSE 123 (H) 04/21/2019   BUN 12 04/21/2019   CREATININE 0.87 04/21/2019   BILITOT 0.7 04/21/2019   ALKPHOS 110 04/21/2019   AST 18 04/21/2019   ALT 13 04/21/2019   PROT 7.8 04/21/2019   ALBUMIN 3.6 04/21/2019   CALCIUM 9.0 04/21/2019   GFRAA >60 04/21/2019    Speciality Comments: No specialty comments available.  Procedures:  No procedures performed Allergies: Plaquenil [hydroxychloroquine sulfate], Azithromycin, Lactose intolerance (gi), and Penicillin g   Assessment / Plan:     Visit Diagnoses: No diagnosis found.  Orders: No orders of the defined types were placed in this encounter.  No orders of the defined types were placed in this encounter.   Face-to-face time spent with patient was *** minutes. Greater than 50% of time was spent in counseling and coordination of care.  Follow-Up Instructions: No follow-ups on file.   Gearldine Bienenstock, PA-C  Note - This record has been created using Dragon software.  Chart creation errors have been sought, but may not always  have been located. Such creation errors do not reflect on  the standard of medical care.

## 2019-10-23 ENCOUNTER — Ambulatory Visit: Payer: Medicare Other | Admitting: Rheumatology

## 2019-10-23 ENCOUNTER — Other Ambulatory Visit: Payer: Self-pay

## 2019-10-23 DIAGNOSIS — F5101 Primary insomnia: Secondary | ICD-10-CM

## 2019-10-23 DIAGNOSIS — M7062 Trochanteric bursitis, left hip: Secondary | ICD-10-CM

## 2019-10-23 DIAGNOSIS — M87059 Idiopathic aseptic necrosis of unspecified femur: Secondary | ICD-10-CM

## 2019-10-23 DIAGNOSIS — R7303 Prediabetes: Secondary | ICD-10-CM

## 2019-10-23 DIAGNOSIS — M329 Systemic lupus erythematosus, unspecified: Secondary | ICD-10-CM

## 2019-10-23 DIAGNOSIS — E210 Primary hyperparathyroidism: Secondary | ICD-10-CM

## 2019-10-23 DIAGNOSIS — M1711 Unilateral primary osteoarthritis, right knee: Secondary | ICD-10-CM

## 2019-10-23 DIAGNOSIS — I7774 Dissection of vertebral artery: Secondary | ICD-10-CM

## 2019-10-23 DIAGNOSIS — I10 Essential (primary) hypertension: Secondary | ICD-10-CM

## 2019-10-23 DIAGNOSIS — D649 Anemia, unspecified: Secondary | ICD-10-CM

## 2019-10-23 DIAGNOSIS — D693 Immune thrombocytopenic purpura: Secondary | ICD-10-CM

## 2019-10-25 ENCOUNTER — Other Ambulatory Visit: Payer: Self-pay | Admitting: *Deleted

## 2019-10-25 ENCOUNTER — Telehealth: Payer: Self-pay | Admitting: *Deleted

## 2019-10-25 DIAGNOSIS — D693 Immune thrombocytopenic purpura: Secondary | ICD-10-CM

## 2019-10-25 NOTE — Telephone Encounter (Signed)
Patient called - asked when she should have labs done? Dr. Candise Che informed. Contacted patient with Dr. Clyda Greener response -schedule for first available f/u with lab appt prior to MD appt. Patient in agreement. Schedule message sent.

## 2019-11-13 ENCOUNTER — Telehealth: Payer: Self-pay | Admitting: Internal Medicine

## 2019-11-13 NOTE — Telephone Encounter (Signed)
Sent to Dr. Burns to advise. 

## 2019-11-13 NOTE — Telephone Encounter (Signed)
Yes there is a slight risk of getting an infection.  If he is on antibiotic that risk is very low and she could wear a mask to help prevent getting an infection

## 2019-11-13 NOTE — Telephone Encounter (Signed)
Patient states her little brother has pneumonia and wanted to know if it would be a good idea if she went to visit him because of her lupus. Patient # [phone]

## 2019-11-14 NOTE — Telephone Encounter (Signed)
Was able to [facility] for pt of Dr. Lawerance Bach advice.

## 2019-12-06 ENCOUNTER — Ambulatory Visit: Payer: Medicare Other | Admitting: Rheumatology

## 2020-01-31 ENCOUNTER — Ambulatory Visit: Payer: Self-pay

## 2020-01-31 ENCOUNTER — Other Ambulatory Visit: Payer: Self-pay

## 2020-01-31 ENCOUNTER — Encounter: Payer: Self-pay | Admitting: Rheumatology

## 2020-01-31 ENCOUNTER — Ambulatory Visit: Payer: Medicare HMO | Admitting: Rheumatology

## 2020-01-31 VITALS — BP 123/79 | HR 96 | Ht 63.75 in | Wt 194.2 lb

## 2020-01-31 DIAGNOSIS — G8929 Other chronic pain: Secondary | ICD-10-CM

## 2020-01-31 DIAGNOSIS — M79671 Pain in right foot: Secondary | ICD-10-CM

## 2020-01-31 DIAGNOSIS — M329 Systemic lupus erythematosus, unspecified: Secondary | ICD-10-CM | POA: Diagnosis not present

## 2020-01-31 DIAGNOSIS — M79672 Pain in left foot: Secondary | ICD-10-CM | POA: Diagnosis not present

## 2020-01-31 DIAGNOSIS — Z96652 Presence of left artificial knee joint: Secondary | ICD-10-CM

## 2020-01-31 DIAGNOSIS — R760 Raised antibody titer: Secondary | ICD-10-CM | POA: Diagnosis not present

## 2020-01-31 DIAGNOSIS — R7989 Other specified abnormal findings of blood chemistry: Secondary | ICD-10-CM

## 2020-01-31 DIAGNOSIS — D693 Immune thrombocytopenic purpura: Secondary | ICD-10-CM

## 2020-01-31 DIAGNOSIS — R768 Other specified abnormal immunological findings in serum: Secondary | ICD-10-CM

## 2020-01-31 DIAGNOSIS — M25511 Pain in right shoulder: Secondary | ICD-10-CM

## 2020-01-31 DIAGNOSIS — I10 Essential (primary) hypertension: Secondary | ICD-10-CM

## 2020-01-31 DIAGNOSIS — M791 Myalgia, unspecified site: Secondary | ICD-10-CM | POA: Diagnosis not present

## 2020-01-31 DIAGNOSIS — Z79899 Other long term (current) drug therapy: Secondary | ICD-10-CM | POA: Diagnosis not present

## 2020-01-31 DIAGNOSIS — E559 Vitamin D deficiency, unspecified: Secondary | ICD-10-CM | POA: Diagnosis not present

## 2020-01-31 DIAGNOSIS — M25512 Pain in left shoulder: Secondary | ICD-10-CM

## 2020-01-31 DIAGNOSIS — Z96642 Presence of left artificial hip joint: Secondary | ICD-10-CM

## 2020-01-31 DIAGNOSIS — M79642 Pain in left hand: Secondary | ICD-10-CM | POA: Diagnosis not present

## 2020-01-31 DIAGNOSIS — R7689 Other specified abnormal immunological findings in serum: Secondary | ICD-10-CM

## 2020-01-31 DIAGNOSIS — I7774 Dissection of vertebral artery: Secondary | ICD-10-CM

## 2020-01-31 DIAGNOSIS — R7303 Prediabetes: Secondary | ICD-10-CM

## 2020-01-31 DIAGNOSIS — M1711 Unilateral primary osteoarthritis, right knee: Secondary | ICD-10-CM

## 2020-01-31 DIAGNOSIS — M79641 Pain in right hand: Secondary | ICD-10-CM

## 2020-01-31 DIAGNOSIS — E210 Primary hyperparathyroidism: Secondary | ICD-10-CM

## 2020-01-31 DIAGNOSIS — Z8673 Personal history of transient ischemic attack (TIA), and cerebral infarction without residual deficits: Secondary | ICD-10-CM

## 2020-01-31 NOTE — Progress Notes (Addendum)
Office Visit Note  Patient: Elaine Allen             Date of Birth: [birth year]           MRN: [id number]             PCP: Hoyt Koch, MD Referring: Biagio Borg, MD Visit Date: 01/31/2020 Occupation: @GUAROCC @  Subjective:  Pain in multiple joints.   History of Present Illness: Elaine Allen is a 65 y.o. female seen in consultation per request of her PCP.  According to the patient her symptoms a started in 2004 when she was living in [place].  She states that she had routine labs done by her PCP which showed abnormal cell counts.  She was referred to an oncologist and had extensive work-up including bone marrow biopsy.  After that she was referred to rheumatologist.  We did some lab work and her ANA was positive.  She was diagnosed with possible lupus and was placed on Plaquenil.  She states the Plaquenil caused pruritus on her hands and hives and it was discontinued.  She had lab work every 3 months by the rheumatologist until she moved to [place] in 2012.  No treatment was given.  She states she had been experiencing increased fatigue since she moved to [facility] she also has pain in all of her joints in all of her muscles.  She complains of pain and discomfort in her bilateral shoulders, bilateral hands, bilateral knees and her ankles.  She notices swelling in her hands and her ankles.  She states she has decreased grip strength.  She states her PCP placed her on prednisone when she moved to [place] which helped her.  She has had about 4-5 times prednisone per year for the last few years.  She states that she has been seeing Dr. Mayer Camel for the last few years as well.  He has an x-rays of her shoulders and diagnosed her with osteoarthritis.  She has had cortisone injections to her both shoulders.  She underwent left total knee replacement few years ago and left hip replacement in June 2020.  She has not taken any prednisone since her hip replacement.  She states  she takes ibuprofen on as needed basis.  She was also diagnosed with thrombocytopenia many years ago.  She has been under care of Dr. Irene Limbo and has been treated with Rituxan every 6 to 8 weeks.  Activities of Daily Living:  Patient reports morning stiffness for 24 hours.   Patient Reports nocturnal pain.  Difficulty dressing/grooming: Reports Difficulty climbing stairs: Reports Difficulty getting out of chair: Reports Difficulty using hands for taps, buttons, cutlery, and/or writing: Reports  Review of Systems  Constitutional: Positive for fatigue. Negative for night sweats, weight gain and weight loss.  HENT: Positive for mouth dryness and nose dryness. Negative for mouth sores, trouble swallowing and trouble swallowing.        Nasal scab persistent  Eyes: Positive for dryness. Negative for pain, redness, itching and visual disturbance.  Respiratory: Negative for cough, hemoptysis, shortness of breath and difficulty breathing.   Cardiovascular: Negative for chest pain, palpitations, hypertension, irregular heartbeat and swelling in legs/feet.  Gastrointestinal: Negative for abdominal pain, blood in stool, constipation and diarrhea.  Endocrine: Negative for increased urination.  Genitourinary: Negative for painful urination and vaginal dryness.  Musculoskeletal: Positive for arthralgias, joint pain, joint swelling, myalgias, morning stiffness, muscle tenderness and myalgias. Negative for muscle weakness.  Skin: Positive for color change  and hair loss. Negative for rash, redness, skin tightness, ulcers and sensitivity to sunlight.  Allergic/Immunologic: Negative for susceptible to infections.  Neurological: Positive for headaches and weakness. Negative for dizziness, numbness, memory loss and night sweats.  Hematological: Negative for swollen glands.  Psychiatric/Behavioral: Positive for sleep disturbance. Negative for depressed mood and confusion. The patient is nervous/anxious.     PMFS  History:  Patient Active Problem List   Diagnosis Date Noted  . Immune thrombocytopenia ([place]) 03/26/2019  . Pre-op evaluation 06/24/2018  . AVN of femur ([place]) 06/23/2018  . Greater trochanteric bursitis of left hip 04/08/2018  . Left knee pain 04/06/2018  . Numbness in feet 09/14/2017  . Elevated LFTs 02/24/2017  . Pre-diabetes 02/19/2017  . Fatigue 02/19/2017  . OA (osteoarthritis) of hip 10/29/2016  . Numbness and tingling in left arm 05/05/2016  . Cough 12/25/2015  . Wheezing 12/25/2015  . Abdominal pain 12/25/2015  . Back pain 10/01/2015  . Insomnia 03/01/2015  . Family hx of colon cancer 11/12/2014  . Essential hypertension, benign 05/15/2013  . Other and unspecified hyperlipidemia 05/15/2013  . Vertebral artery dissection ([place]) 05/14/2013  . Hx of completed stroke 05/14/2013  . Chronic anemia   . Primary osteoarthritis of right knee 12/19/2007  . Primary hyperparathyroidism ([place]) 11/24/2007  . OSTEOPENIA 11/24/2007  . Lupus (systemic lupus erythematosus) ([place]) 01/05/2002    Past Medical History:  Diagnosis Date  . Arthritis   . Blind right eye    sees colors  . Chronic anemia   . Detached retina, right    corrected good for about 6 months sees peripheral colors only  . History of kidney stones   . Hypertension   . Hyperthyroidism   . Lupus ([place])   . Pneumonia   . Retinal detachment   . Spasmodic dysphonia   . Stroke ([place]) 2015   no permanant defecits had weakness in left arm in hospital Had PT also  . Vertigo    Hx of preceding stroke    Family History  Problem Relation Age of Onset  . Cancer Mother        liver  . Diabetes Mother   . Arthritis Mother   . Cancer Father        prostate,METS, stomach   . Ovarian cancer Sister   . Throat cancer Brother    Past Surgical History:  Procedure Laterality Date  . ABDOMINAL HYSTERECTOMY    . BREAST LUMPECTOMY     left breast benign  . KNEE SURGERY Left   . THYROID SURGERY    . TOTAL HIP ARTHROPLASTY  Left 06/27/2018   Procedure: TOTAL HIP ARTHROPLASTY ANTERIOR APPROACH;  Surgeon: Frederik Pear, MD;  Location: [facility];  Service: Orthopedics;  Laterality: Left;   Social History   Social History Narrative  . Not on file   Immunization History  Administered Date(s) Administered  . Fluad Quad(high Dose 65+) 10/21/2018  . Hep A / Hep B 03/08/2017  . Influenza Whole 12/19/2007  . Influenza,inj,Quad PF,6+ Mos 11/12/2014, 10/01/2015, 10/28/2016, 02/09/2018  . PFIZER(Purple Top)SARS-COV-2 Vaccination 06/06/2019, 07/06/2019  . PPD Test 01/09/2015, 03/18/2016, 03/23/2016, 08/10/2017  . Pneumococcal Conjugate-13 10/21/2018  . Tdap 12/05/2014  . Zoster 01/02/2015     Objective: Vital Signs: BP 123/79 (BP Location: Right Arm, Patient Position: Sitting, Cuff Size: Normal)   Pulse 96   Ht 5' 3.75" (1.619 m)   Wt 194 lb 3.2 oz (88.1 kg)   BMI 33.60 kg/m    Physical Exam Vitals and  nursing note reviewed.  Constitutional:      Appearance: She is well-developed and well-nourished.  HENT:     Head: Normocephalic and atraumatic.  Eyes:     Extraocular Movements: EOM normal.     Conjunctiva/sclera: Conjunctivae normal.  Cardiovascular:     Rate and Rhythm: Normal rate and regular rhythm.     Pulses: Intact distal pulses.     Heart sounds: Normal heart sounds.  Pulmonary:     Effort: Pulmonary effort is normal.     Breath sounds: Normal breath sounds.  Abdominal:     General: Bowel sounds are normal.     Palpations: Abdomen is soft.  Musculoskeletal:     Cervical back: Normal range of motion.  Lymphadenopathy:     Cervical: No cervical adenopathy.  Skin:    General: Skin is warm and dry.     Capillary Refill: Capillary refill takes less than 2 seconds.  Neurological:     Mental Status: She is alert and oriented to person, place, and time.  Psychiatric:        Mood and Affect: Mood and affect normal.        Behavior: Behavior normal.      Musculoskeletal Exam: C-spine was in  good range of motion.  Shoulder joints, elbow joints, wrist joints, MCPs PIPs and DIPs with good range of motion with no synovitis.  Her left hip joint is replaced.  Right hip joint has some limitation without pain.  Left knee joint is replaced.  She had tenderness but no synovitis over ankle joints.  There was no tenderness over MTP joints.  CDAI Exam: CDAI Score: - Patient Global: -; Provider Global: - Swollen: -; Tender: - Joint Exam 01/31/2020   No joint exam has been documented for this visit   There is currently no information documented on the homunculus. Go to the Rheumatology activity and complete the homunculus joint exam.  Investigation: No additional findings.  Imaging: No results found.  Recent Labs: Lab Results  Component Value Date   WBC 3.3 (L) 04/21/2019   HGB 13.0 04/21/2019   PLT 176 04/21/2019   NA 144 04/21/2019   K 3.7 04/21/2019   CL 111 04/21/2019   CO2 21 (L) 04/21/2019   GLUCOSE 123 (H) 04/21/2019   BUN 12 04/21/2019   CREATININE 0.87 04/21/2019   BILITOT 0.7 04/21/2019   ALKPHOS 110 04/21/2019   AST 18 04/21/2019   ALT 13 04/21/2019   PROT 7.8 04/21/2019   ALBUMIN 3.6 04/21/2019   CALCIUM 9.0 04/21/2019   GFRAA >60 04/21/2019  March 04, 2017 ANA 1: 1280, anti-Ro positive (double-stranded DNA, RNP, Smith, SCL 70, SSB, Jo 1, centromere negative) Hepatitis A, hepatitis B and hepatitis C negative  Speciality Comments: No specialty comments available.  Procedures:  No procedures performed Allergies: Plaquenil [hydroxychloroquine sulfate], Azithromycin, Lactose intolerance (gi), and Penicillin g   Assessment / Plan:     Visit Diagnoses: SLE (systemic lupus erythematosus related syndrome) ([place]) -patient states she was diagnosed with lupus in [place].  She was tried on Plaquenil and it was discontinued as she developed rash and hives.  She had no treatment and had follow-up labs monitored only.  She gives history of fatigue, nasal ulcer,  sicca symptoms, hair loss, Raynaud's and joint pain.  There is no history of malar rash, photosensitivity or lymphadenopathy.  She was given Prednisone about 4-5 times by her PCP over the last few years since she moved to [facility] per patient.  I just reviewed the chart and noticed that she has received Rituxan by Dr.Kale for thrombocytopenia.  Positive ANA (antinuclear antibody) -on chart review I found that she had positive ANA, positive Ro, positive lupus anticoagulant.  Her anticardiolipin antibodies and antibeta-2 GP 1 antibodies were negative.  I will obtain additional antibodies today.  Plan: Urinalysis, Routine w reflex microscopic, Sedimentation rate, ANA, Anti-scleroderma antibody, RNP Antibody, Anti-Smith antibody, Sjogrens syndrome-A extractable nuclear antibody, Sjogrens syndrome-B extractable nuclear antibody, C3 and C4  Immune thrombocytopenia ([facility]) - followed by Dr. Irene Limbo and is currently on IV Rituxan.  Lupus anticoagulant positive-she is not on any anticoagulant.  High risk medication use -patient received Rituxan 700 mg IV by Dr.Kale on March 31, 2019, April 07, 2019, April 14, 2019, and April 21, 2019.  plan: CBC with Differential/Platelet, COMPLETE METABOLIC PANEL WITH GFR, Hepatitis B core antibody, IgM, Hepatitis B surface antigen, Hepatitis C antibody, HIV Antibody (routine testing w rflx), QuantiFERON-TB Gold Plus, Serum protein electrophoresis with reflex, IgG, IgA, IgM, Thiopurine methyltransferase(tpmt)rbc  Elevated LFTs - in the past, may be related to NSAID use.  Patient states she takes NSAIDs frequently.  Myalgia -she complains of generalized muscle pain.  Plan: CK, TSH  Chronic pain of both shoulders - Patient had x-rays and injections by Dr. Mayer Camel in the past.  She was told that she had arthritis.  Pain in both hands -she complains of pain and swelling in her hands.  No synovitis was noted.  Plan: Rheumatoid factor, Cyclic citrul peptide antibody, IgG, XR Hand 2 View  Right, XR Hand 2 View Left.  X-rays obtained today were consistent with osteoarthritis.  Status post total hip replacement, left-June 27, 2018 by Dr. Carolyn Stare 1  Primary osteoarthritis of right knee-she has some chronic discomfort.  S/P TKR (total knee replacement), left-in the past doing well.  Pain in both feet -she complains of pain and swelling in her ankles and her feet.  No swelling was noted on the examination.  She had tenderness over ankles.  Plan: XR Foot 2 Views Right, XR Foot 2 Views Left.  X-rays obtained today were consistent with osteoarthritis.  Other medical problems are listed as follows:  Vertebral artery dissection ([facility])  Hx of completed stroke  Essential hypertension, benign  Pre-diabetes  Vitamin D deficiency -and has history of vitamin D deficiency.  Plan: VITAMIN D 25 Hydroxy (Vit-D Deficiency, Fractures)  Primary hyperparathyroidism ([place])  Educated about COVID-19 virus infection-she is fully vaccinated against COVID-19.  She was advised to get a booster.  Use of mask, social distancing and hand hygiene was discussed.  Orders: Orders Placed This Encounter  Procedures  . XR Hand 2 View Right  . XR Hand 2 View Left  . XR Foot 2 Views Right  . XR Foot 2 Views Left  . CBC with Differential/Platelet  . COMPLETE METABOLIC PANEL WITH GFR  . Urinalysis, Routine w reflex microscopic  . Sedimentation rate  . VITAMIN D 25 Hydroxy (Vit-D Deficiency, Fractures)  . CK  . TSH  . Rheumatoid factor  . Cyclic citrul peptide antibody, IgG  . ANA  . Anti-scleroderma antibody  . RNP Antibody  . Anti-Smith antibody  . Sjogrens syndrome-A extractable nuclear antibody  . Sjogrens syndrome-B extractable nuclear antibody  . C3 and C4  . Beta-2 glycoprotein antibodies  . Cardiolipin antibodies, IgG, IgM, IgA  . Lupus Anticoagulant Eval w/Reflex  . Hepatitis B core antibody, IgM  . Hepatitis B surface antigen  . Hepatitis C antibody  .  HIV Antibody (routine testing w  rflx)  . QuantiFERON-TB Gold Plus  . Serum protein electrophoresis with reflex  . IgG, IgA, IgM  . Thiopurine methyltransferase(tpmt)rbc   No orders of the defined types were placed in this encounter.   Follow-Up Instructions: Return for Positive ANA.   Bo Merino, MD  Note - This record has been created using Editor, commissioning.  Chart creation errors have been sought, but may not always  have been located. Such creation errors do not reflect on  the standard of medical care.,

## 2020-02-02 NOTE — Progress Notes (Signed)
Low white cell count noted.  Kidney functions are mildly decreased.  Sjogren's antibody is positive.  All other autoimmune labs are negative.  Patient is on Rituxan by Dr. Candise Che.  No additional treatment is advised.  I will forward labs to Dr. Candise Che.  Vitamin D deficiency noted.  Please advise patient to take vitamin D 2000 units daily.

## 2020-02-05 NOTE — Progress Notes (Signed)
I spoke with the patient and discussed the lab results to her.  She states that the nurse who call told her that all her labs were negative but she could see abnormal labs on my chart.  I discussed all the abnormal labs and the labs within the normal limits.  I explained to her that we will discuss lab results when she will come for follow-up visit.  She voiced understanding.  She wanted me to forward lab results to Dr. Candise Che which I already did and notified the patient.

## 2020-02-13 LAB — ANTI-SCLERODERMA ANTIBODY: Scleroderma (Scl-70) (ENA) Antibody, IgG: <1 AI

## 2020-02-13 LAB — URINALYSIS, ROUTINE W REFLEX MICROSCOPIC
Bilirubin Urine: NEGATIVE
Glucose, UA: NEGATIVE
Hgb urine dipstick: NEGATIVE
Ketones, ur: NEGATIVE
Nitrite: NEGATIVE
Specific Gravity, Urine: 1.019 (ref 1.001–1.03)
pH: 5.5 (ref 5.0–8.0)

## 2020-02-13 LAB — COMPLETE METABOLIC PANEL WITH GFR
AG Ratio: 1.2 (calc) (ref 1.0–2.5)
ALT: 16 U/L (ref 6–29)
AST: 23 U/L (ref 10–35)
Albumin: 4.2 g/dL (ref 3.6–5.1)
Alkaline phosphatase (APISO): 104 U/L (ref 37–153)
BUN/Creatinine Ratio: 16 (calc) (ref 6–22)
BUN: 17 mg/dL (ref 7–25)
CO2: 25 mmol/L (ref 20–32)
Calcium: 9.4 mg/dL (ref 8.6–10.4)
Chloride: 104 mmol/L (ref 98–110)
Creat: 1.09 mg/dL — ABNORMAL HIGH (ref 0.50–0.99)
GFR, Est African American: 61 mL/min/{1.73_m2} (ref >=60)
GFR, Est Non African American: 53 mL/min/{1.73_m2} — ABNORMAL LOW (ref >=60)
Globulin: 3.4 g/dL (calc) (ref 1.9–3.7)
Glucose, Bld: 101 mg/dL — ABNORMAL HIGH (ref 65–99)
Potassium: 3.7 mmol/L (ref 3.5–5.3)
Sodium: 140 mmol/L (ref 135–146)
Total Bilirubin: 0.5 mg/dL (ref 0.2–1.2)
Total Protein: 7.6 g/dL (ref 6.1–8.1)

## 2020-02-13 LAB — CBC WITH DIFFERENTIAL/PLATELET
Absolute Monocytes: 305 cells/uL (ref 200–950)
Basophils Absolute: 10 cells/uL (ref 0–200)
Basophils Relative: 0.4 %
Eosinophils Absolute: 72 cells/uL (ref 15–500)
Eosinophils Relative: 3 %
HCT: 39.5 % (ref 35.0–45.0)
Hemoglobin: 13.1 g/dL (ref 11.7–15.5)
Lymphs Abs: 1174 cells/uL (ref 850–3900)
MCH: 29.5 pg (ref 27.0–33.0)
MCHC: 33.2 g/dL (ref 32.0–36.0)
MCV: 89 fL (ref 80.0–100.0)
MPV: 12.9 fL — ABNORMAL HIGH (ref 7.5–12.5)
Monocytes Relative: 12.7 %
Neutro Abs: 840 cells/uL — ABNORMAL LOW (ref 1500–7800)
Neutrophils Relative %: 35 %
Platelets: 153 10*3/uL (ref 140–400)
RBC: 4.44 10*6/uL (ref 3.80–5.10)
RDW: 13.4 % (ref 11.0–15.0)
Total Lymphocyte: 48.9 %
WBC: 2.4 10*3/uL — ABNORMAL LOW (ref 3.8–10.8)

## 2020-02-13 LAB — CYCLIC CITRUL PEPTIDE ANTIBODY, IGG: Cyclic Citrullin Peptide Ab: <16 UNITS

## 2020-02-13 LAB — SJOGRENS SYNDROME-B EXTRACTABLE NUCLEAR ANTIBODY: SSB (La) (ENA) Antibody, IgG: <1 AI

## 2020-02-13 LAB — IGG, IGA, IGM
IgG (Immunoglobin G), Serum: 1678 mg/dL — ABNORMAL HIGH (ref 600–1540)
IgM, Serum: 96 mg/dL (ref 50–300)
Immunoglobulin A: 459 mg/dL — ABNORMAL HIGH (ref 70–320)

## 2020-02-13 LAB — QUANTIFERON-TB GOLD PLUS
Mitogen-NIL: >10 IU/mL
NIL: 0.04 IU/mL
QuantiFERON-TB Gold Plus: NEGATIVE
TB1-NIL: 0 IU/mL
TB2-NIL: 0 IU/mL

## 2020-02-13 LAB — PROTEIN ELECTROPHORESIS, SERUM, WITH REFLEX
Albumin ELP: 4.1 g/dL (ref 3.8–4.8)
Alpha 1: 0.3 g/dL (ref 0.2–0.3)
Alpha 2: 0.8 g/dL (ref 0.5–0.9)
Beta 2: 0.5 g/dL (ref 0.2–0.5)
Beta Globulin: 0.4 g/dL (ref 0.4–0.6)
Gamma Globulin: 1.5 g/dL (ref 0.8–1.7)
Total Protein: 7.7 g/dL (ref 6.1–8.1)

## 2020-02-13 LAB — ANTI-NUCLEAR AB-TITER (ANA TITER)
ANA TITER: 1:320 {titer} — ABNORMAL HIGH
ANA Titer 1: 1:320 {titer} — ABNORMAL HIGH

## 2020-02-13 LAB — ANA: Anti Nuclear Antibody (ANA): POSITIVE — AB

## 2020-02-13 LAB — RHEUMATOID FACTOR: Rheumatoid fact SerPl-aCnc: <14 IU/mL (ref <14)

## 2020-02-13 LAB — ANTI-SMITH ANTIBODY: ENA SM Ab Ser-aCnc: <1 AI

## 2020-02-13 LAB — VITAMIN D 25 HYDROXY (VIT D DEFICIENCY, FRACTURES): Vit D, 25-Hydroxy: 20 ng/mL — ABNORMAL LOW (ref 30–100)

## 2020-02-13 LAB — C3 AND C4
C3 Complement: 115 mg/dL (ref 83–193)
C4 Complement: 38 mg/dL (ref 15–57)

## 2020-02-13 LAB — SJOGRENS SYNDROME-A EXTRACTABLE NUCLEAR ANTIBODY: SSA (Ro) (ENA) Antibody, IgG: >8 AI — AB

## 2020-02-13 LAB — THIOPURINE METHYLTRANSFERASE (TPMT), RBC: Thiopurine Methyltransferase, RBC: 12 nmol/hr/mL RBC — ABNORMAL LOW

## 2020-02-13 LAB — TSH: TSH: 2.94 mIU/L (ref 0.40–4.50)

## 2020-02-13 LAB — RNP ANTIBODY: Ribonucleic Protein(ENA) Antibody, IgG: <1 AI

## 2020-02-13 LAB — HIV ANTIBODY (ROUTINE TESTING W REFLEX): HIV 1&2 Ab, 4th Generation: NONREACTIVE

## 2020-02-13 LAB — CK: Total CK: 104 U/L (ref 29–143)

## 2020-02-13 LAB — SEDIMENTATION RATE: Sed Rate: 62 mm/h — ABNORMAL HIGH (ref 0–30)

## 2020-02-21 ENCOUNTER — Ambulatory Visit: Payer: Medicare (Managed Care) | Admitting: Rheumatology

## 2020-02-26 NOTE — Progress Notes (Signed)
Office Visit Note  Patient: Elaine Allen             Date of Birth: Nov 05, 1955           MRN: [id number]             PCP: Hoyt Koch, MD Referring: Hoyt Koch, * Visit Date: 03/01/2020 Occupation: @GUAROCC @  Subjective:  Pain in multiple joints   History of Present Illness: Vickie Melnik is a 65 y.o. female with history of systemic lupus erythematosus.  She states she has been having increased pain and discomfort in her hands.  Is is difficult for her to do her job.  She has always stiffness in the morning which lasts almost all days.  She has been also having lower back pain and some pain in her lower extremities.  She states that she was doing much better when she was on low-dose prednisone.  She has not taken prednisone since June 2020 when she had hip replacement.  She was Rituxan by Dr. Irene Limbo every 6 to 8 weeks which she has not had in 1 year.  She continues to have fatigue, sicca symptoms, joint pain, joint swelling, Raynauds.  She never recovered hair loss.  She has been having increased lower back pain as well.  She states she had x-rays of her lumbar spine which showed degenerative changes.  Activities of Daily Living:  Patient reports morning stiffness for all day. Patient Reports nocturnal pain.  Difficulty dressing/grooming: Denies Difficulty climbing stairs: Reports Difficulty getting out of chair: Reports Difficulty using hands for taps, buttons, cutlery, and/or writing: Reports  Review of Systems  Constitutional: Positive for fatigue.  HENT: Positive for nose dryness. Negative for mouth sores and mouth dryness.        Nose sores  Eyes: Positive for dryness. Negative for pain and itching.  Respiratory: Negative for shortness of breath and difficulty breathing.   Cardiovascular: Negative for chest pain and palpitations.  Gastrointestinal: Negative for blood in stool, constipation and diarrhea.  Endocrine: Negative for increased  urination.  Genitourinary: Negative for difficulty urinating.  Musculoskeletal: Positive for arthralgias, joint pain, joint swelling, myalgias, morning stiffness, muscle tenderness and myalgias.  Skin: Negative for color change, rash and redness.  Allergic/Immunologic: Negative for susceptible to infections.  Neurological: Positive for weakness. Negative for dizziness, numbness, headaches and memory loss.  Hematological: Negative for bruising/bleeding tendency.  Psychiatric/Behavioral: Positive for sleep disturbance. Negative for confusion.    PMFS History:  Patient Active Problem List   Diagnosis Date Noted  . Immune thrombocytopenia ([place]) 03/26/2019  . Pre-op evaluation 06/24/2018  . AVN of femur ([place]) 06/23/2018  . Greater trochanteric bursitis of left hip 04/08/2018  . Left knee pain 04/06/2018  . Numbness in feet 09/14/2017  . Elevated LFTs 02/24/2017  . Pre-diabetes 02/19/2017  . Fatigue 02/19/2017  . OA (osteoarthritis) of hip 10/29/2016  . Numbness and tingling in left arm 05/05/2016  . Cough 12/25/2015  . Wheezing 12/25/2015  . Abdominal pain 12/25/2015  . Back pain 10/01/2015  . Insomnia 03/01/2015  . Family hx of colon cancer 11/12/2014  . Essential hypertension, benign 05/15/2013  . Other and unspecified hyperlipidemia 05/15/2013  . Vertebral artery dissection ([place]) 05/14/2013  . Hx of completed stroke 05/14/2013  . Chronic anemia   . Primary osteoarthritis of right knee 12/19/2007  . Primary hyperparathyroidism ([place]) 11/24/2007  . OSTEOPENIA 11/24/2007  . Lupus (systemic lupus erythematosus) ([place]) 01/05/2002    Past Medical History:  Diagnosis  Date  . Arthritis   . Blind right eye    sees colors  . Chronic anemia   . Detached retina, right    corrected good for about 6 months sees peripheral colors only  . History of kidney stones   . Hypertension   . Hyperthyroidism   . Lupus ([place])   . Pneumonia   . Retinal detachment   . Spasmodic dysphonia   .  Stroke ([place]) 2015   no permanant defecits had weakness in left arm in hospital Had PT also  . Vertigo    Hx of preceding stroke    Family History  Problem Relation Age of Onset  . Cancer Mother        liver  . Diabetes Mother   . Arthritis Mother   . Cancer Father        prostate,METS, stomach   . Ovarian cancer Sister   . Throat cancer Brother    Past Surgical History:  Procedure Laterality Date  . ABDOMINAL HYSTERECTOMY    . BREAST LUMPECTOMY     left breast benign  . KNEE SURGERY Left   . THYROID SURGERY    . TOTAL HIP ARTHROPLASTY Left 06/27/2018   Procedure: TOTAL HIP ARTHROPLASTY ANTERIOR APPROACH;  Surgeon: Frederik Pear, MD;  Location: [facility];  Service: Orthopedics;  Laterality: Left;   Social History   Social History Narrative  . Not on file   Immunization History  Administered Date(s) Administered  . Fluad Quad(high Dose 65+) 10/21/2018  . Hep A / Hep B 03/08/2017  . Influenza Whole 12/19/2007  . Influenza,inj,Quad PF,6+ Mos 11/12/2014, 10/01/2015, 10/28/2016, 02/09/2018  . PFIZER(Purple Top)SARS-COV-2 Vaccination 06/06/2019, 07/06/2019  . PPD Test 01/09/2015, 03/18/2016, 03/23/2016, 08/10/2017  . Pneumococcal Conjugate-13 10/21/2018  . Tdap 12/05/2014  . Zoster 01/02/2015     Objective: Vital Signs: BP 137/82 (BP Location: Left Arm, Patient Position: Sitting, Cuff Size: Large)   Pulse 84   Resp 17   Ht $R'5\' 3"'eB$  (1.6 m)   Wt 196 lb 12.8 oz (89.3 kg)   BMI 34.86 kg/m    Physical Exam Vitals and nursing note reviewed.  Constitutional:      Appearance: She is well-developed and well-nourished.  HENT:     Head: Normocephalic and atraumatic.  Eyes:     Extraocular Movements: EOM normal.     Conjunctiva/sclera: Conjunctivae normal.  Cardiovascular:     Rate and Rhythm: Normal rate and regular rhythm.     Pulses: Intact distal pulses.     Heart sounds: Normal heart sounds.  Pulmonary:     Effort: Pulmonary effort is normal.     Breath sounds: Normal  breath sounds.  Abdominal:     General: Bowel sounds are normal.     Palpations: Abdomen is soft.  Musculoskeletal:     Cervical back: Normal range of motion.  Lymphadenopathy:     Cervical: No cervical adenopathy.  Skin:    General: Skin is warm and dry.     Capillary Refill: Capillary refill takes less than 2 seconds.     Comments: Hair loss noted on the scalp.  She does not have eyebrows or eyelashes.  Neurological:     Mental Status: She is alert and oriented to person, place, and time.  Psychiatric:        Mood and Affect: Mood and affect normal.        Behavior: Behavior normal.      Musculoskeletal Exam: She had good range of motion  of her cervical spine.  Shoulder joints, elbow joints, wrist joints, MCPs PIPs and DIPs with good range of motion.  She had tenderness on palpation over MCPs and PIPs but no synovitis was noted.  She had discomfort range of motion of her hip joints and knee joints.  Her left hip and left knee joint are replaced.  There was no tenderness over MTPs or ankle joints.  CDAI Exam: CDAI Score: - Patient Global: -; Provider Global: - Swollen: 0 ; Tender: 0  Joint Exam 03/01/2020   No joint exam has been documented for this visit   There is currently no information documented on the homunculus. Go to the Rheumatology activity and complete the homunculus joint exam.  Investigation: No additional findings.  Imaging: XR Foot 2 Views Left  Result Date: 01/31/2020 First MTP, PIP and DIP narrowing was noted.  No intertarsal, tibiotalar or subtalar joint space narrowing was noted.  Inferior calcaneal spur was noted.  No erosive changes were noted. Impression: These findings are consistent with osteoarthritis of the foot.  XR Foot 2 Views Right  Result Date: 01/31/2020 First MTP, PIP and DIP narrowing was noted.  No intertarsal, tibiotalar or subtalar joint space narrowing was noted.  Inferior calcaneal spur was noted.  No erosive changes were noted.  Impression: These findings are consistent with osteoarthritis of the foot.  XR Hand 2 View Left  Result Date: 01/31/2020 CMC, PIP and DIP narrowing was noted.  No MCP, intercarpal or radiocarpal joint space narrowing was noted.  No erosive changes were noted. Impression: These findings are consistent with osteoarthritis of the hand.  XR Hand 2 View Right  Result Date: 01/31/2020 CMC, PIP and DIP narrowing was noted.  No MCP, intercarpal or radiocarpal joint space narrowing was noted.  No erosive changes were noted. Impression: These findings are consistent with osteoarthritis of the hand.   Recent Labs: Lab Results  Component Value Date   WBC 2.4 (L) 01/31/2020   HGB 13.1 01/31/2020   PLT 153 01/31/2020   NA 140 01/31/2020   K 3.7 01/31/2020   CL 104 01/31/2020   CO2 25 01/31/2020   GLUCOSE 101 (H) 01/31/2020   BUN 17 01/31/2020   CREATININE 1.09 (H) 01/31/2020   BILITOT 0.5 01/31/2020   ALKPHOS 110 04/21/2019   AST 23 01/31/2020   ALT 16 01/31/2020   PROT 7.6 01/31/2020   PROT 7.7 01/31/2020   ALBUMIN 3.6 04/21/2019   CALCIUM 9.4 01/31/2020   GFRAA 61 01/31/2020   QFTBGOLDPLUS NEGATIVE 01/31/2020   January 31, 2020 UA showed trace protein, trace leukocytes and few bacteria, SPEP normal, TB Gold negative, IgG elevated, IgA elevated, IgM normal, HIV negative, TPMT 12 (low metabolizer), TSH normal, CK 104, vitamin D 20 ANA 1: 320NH, SSA antibody>8.0 (SSB antibody, Smith, RNP, SCL 70 -), C3-C4 normal, RF negative, anti-CCP negative, ESR 62  February 28, 2019ANA 1: 1280, anti-Ro positive (double-stranded DNA, RNP, Smith, SCL 70, SSB, Jo 1, centromere negative) Hepatitis A, hepatitis B and hepatitis C negative  March 09, 2019 beta-2 GP 1 -, anticardiolipin negative, lupus anticoagulant positive   Speciality Comments: PLQ-blisters all over the body  Procedures:  No procedures performed Allergies: Plaquenil [hydroxychloroquine sulfate], Azithromycin, Lactose intolerance (gi),  and Penicillin g   Assessment / Plan:     Visit Diagnoses: SLE (systemic lupus erythematosus related syndrome) ([place]) - Dxd in [place]. History of rash, hives, fatigue, nasal ulcers, sicca symptoms, hair loss, Raynauds, arthralgias. +ANA,+ Ro , elevated sed rate.  Patient's sed rate is a still elevated.  She complains of increased pain and swelling in her hands.  I do not see any synovitis but she has lot of a stiffness and discomfort in her hands.  She was crying during the office visit as she was a lot of discomfort.  She states she felt the best when she was on prednisone.  She gives history of sicca symptoms hair loss and Raynaud's phenomenon currently.  We had detailed discussion regarding different treatment options.  We discussed low-dose Imuran starting at 25 mg p.o. daily.  There is a concern about developing thrombocytopenia on Imuran.  Her creatinine is mildly elevated.  I am hesitant to put her on methotrexate.  She had blisters with Plaquenil.  CellCept could be another option.  I have sent a message to Dr. Irene Limbo to see what would be the best treatment for her at this point as she has history of thrombocytopenia and neutropenia.  She has taken Imuran in the past.  Indications side effects contraindications were discussed.  If Dr. Irene Limbo agree with the plan I may start her on low-dose Imuran at 25 mg p.o. daily.  Medication counseling:  TPMT: 12 (low metabolizer)  Patient was counseled on the purpose, proper use, and adverse effects of azathioprine including risk of infection, nausea, rash, and hair loss.  Reviewed risk of cancer after long term use.  Discussed risk of myelosupression and reviewed importance of frequent lab work to monitor blood counts.  Reviewed drug-drug interactions including contraindication with allopurinol.  Provided patient with educational materials on azathioprine and answered all questions.  Patient consented to azathioprine.  Will upload consent into the media tab.     Immune thrombocytopenia ([facility]) - On Rituxan by Dr. Irene Limbo for thrombocytopenia.  Her thrombocytopenia recovered on Rituxan.  Lupus anticoagulant positive - Cardiolipin negative, beta-2 GP 1 -.  High risk medication use - TPMT low metabolizer.  Currently on Rituxan by Dr. Irene Limbo.  She has not had Rituxan since April 2021.  Myalgia - History of myalgias.  CK and TSH were normal.  She may have a component of myofascial pain syndrome.  Chronic pain of both shoulders - Followed by Dr. Mayer Camel.  According to patient x-ray showed arthritis.  Primary osteoarthritis of both hands - History of pain in both hands.  X-rays were consistent with osteoarthritis.  Status post total hip replacement, left - Doing well.  By Dr. Mayer Camel on June 27, 2018  Primary osteoarthritis of right knee-she continues to have discomfort in her knee joints.  No warmth swelling effusion was noted.  S/P TKR (total knee replacement), left-she has chronic pain.  Primary osteoarthritis of both feet - Review of pain and swelling in ankles and feet.  No synovitis was noted on the examination.  X-rays were consistent with osteoarthritis.  DDD (degenerative disc disease), lumbar-she had x-rays done by her PCP which show some degenerative changes.  She complains of a lot of lower back pain.  Other medical problems are listed as follows:  Vertebral artery dissection ([facility])  Hx of completed stroke  Spasmodic dysphonia  Essential hypertension, benign  Pre-diabetes-I have advised her to monitor her blood sugar closely while she is on low-dose prednisone.  I would like to get her off the prednisone as soon as we can start her on immunosuppressive agents.  Vitamin D deficiency  Primary hyperparathyroidism ([place])  Orders: No orders of the defined types were placed in this encounter.  Meds ordered this encounter  Medications  .  predniSONE (DELTASONE) 5 MG tablet    Sig: Take 1 tablet (5 mg total) by mouth daily with breakfast.     Dispense:  30 tablet    Refill:  1     Follow-Up Instructions: Return in about 2 months (around 04/29/2020) for Systemic lupus.   Bo Merino, MD  Note - This record has been created using Editor, commissioning.  Chart creation errors have been sought, but may not always  have been located. Such creation errors do not reflect on  the standard of medical care.

## 2020-03-01 ENCOUNTER — Other Ambulatory Visit: Payer: Self-pay

## 2020-03-01 ENCOUNTER — Ambulatory Visit: Payer: Medicare HMO | Admitting: Rheumatology

## 2020-03-01 ENCOUNTER — Telehealth: Payer: Self-pay

## 2020-03-01 ENCOUNTER — Encounter: Payer: Self-pay | Admitting: Rheumatology

## 2020-03-01 VITALS — BP 137/82 | HR 84 | Resp 17 | Ht 63.0 in | Wt 196.8 lb

## 2020-03-01 DIAGNOSIS — E210 Primary hyperparathyroidism: Secondary | ICD-10-CM

## 2020-03-01 DIAGNOSIS — M19071 Primary osteoarthritis, right ankle and foot: Secondary | ICD-10-CM

## 2020-03-01 DIAGNOSIS — Z96652 Presence of left artificial knee joint: Secondary | ICD-10-CM

## 2020-03-01 DIAGNOSIS — G8929 Other chronic pain: Secondary | ICD-10-CM

## 2020-03-01 DIAGNOSIS — M1711 Unilateral primary osteoarthritis, right knee: Secondary | ICD-10-CM

## 2020-03-01 DIAGNOSIS — M19042 Primary osteoarthritis, left hand: Secondary | ICD-10-CM

## 2020-03-01 DIAGNOSIS — M25511 Pain in right shoulder: Secondary | ICD-10-CM | POA: Diagnosis not present

## 2020-03-01 DIAGNOSIS — M5136 Other intervertebral disc degeneration, lumbar region: Secondary | ICD-10-CM

## 2020-03-01 DIAGNOSIS — Z96642 Presence of left artificial hip joint: Secondary | ICD-10-CM

## 2020-03-01 DIAGNOSIS — R760 Raised antibody titer: Secondary | ICD-10-CM

## 2020-03-01 DIAGNOSIS — M791 Myalgia, unspecified site: Secondary | ICD-10-CM | POA: Diagnosis not present

## 2020-03-01 DIAGNOSIS — M329 Systemic lupus erythematosus, unspecified: Secondary | ICD-10-CM | POA: Diagnosis not present

## 2020-03-01 DIAGNOSIS — I10 Essential (primary) hypertension: Secondary | ICD-10-CM

## 2020-03-01 DIAGNOSIS — D693 Immune thrombocytopenic purpura: Secondary | ICD-10-CM | POA: Diagnosis not present

## 2020-03-01 DIAGNOSIS — M19041 Primary osteoarthritis, right hand: Secondary | ICD-10-CM | POA: Diagnosis not present

## 2020-03-01 DIAGNOSIS — M25512 Pain in left shoulder: Secondary | ICD-10-CM

## 2020-03-01 DIAGNOSIS — Z79899 Other long term (current) drug therapy: Secondary | ICD-10-CM | POA: Diagnosis not present

## 2020-03-01 DIAGNOSIS — Z8673 Personal history of transient ischemic attack (TIA), and cerebral infarction without residual deficits: Secondary | ICD-10-CM

## 2020-03-01 DIAGNOSIS — I7774 Dissection of vertebral artery: Secondary | ICD-10-CM

## 2020-03-01 DIAGNOSIS — J383 Other diseases of vocal cords: Secondary | ICD-10-CM

## 2020-03-01 DIAGNOSIS — R7303 Prediabetes: Secondary | ICD-10-CM

## 2020-03-01 DIAGNOSIS — E559 Vitamin D deficiency, unspecified: Secondary | ICD-10-CM

## 2020-03-01 DIAGNOSIS — M19072 Primary osteoarthritis, left ankle and foot: Secondary | ICD-10-CM

## 2020-03-01 MED ORDER — PREDNISONE 5 MG PO TABS: 5.0000 mg | ORAL_TABLET | Freq: Every day | ORAL | 1 refills | Status: DC

## 2020-03-01 NOTE — Patient Instructions (Signed)
Azathioprine tablets What is this medicine? AZATHIOPRINE (ay za THYE oh preen) suppresses the immune system. It is used to prevent organ rejection after a transplant. It is also used to treat rheumatoid arthritis. This medicine may be used for other purposes; ask your health care provider or pharmacist if you have questions. COMMON BRAND NAME(S): Azasan, Imuran What should I tell my health care provider before I take this medicine? They need to know if you have any of these conditions:  infection  kidney disease  liver disease  an unusual or allergic reaction to azathioprine, other medicines, lactose, foods, dyes, or preservatives  pregnant or trying to get pregnant  breast feeding How should I use this medicine? Take this medicine by mouth with a full glass of water. Follow the directions on the prescription label. Take your medicine at regular intervals. Do not take your medicine more often than directed. Continue to take your medicine even if you feel better. Do not stop taking except on your doctor's advice. Talk to your pediatrician regarding the use of this medicine in children. Special care may be needed. Overdosage: If you think you have taken too much of this medicine contact a poison control center or emergency room at once. NOTE: This medicine is only for you. Do not share this medicine with others. What if I miss a dose? If you miss a dose, take it as soon as you can. If it is almost time for your next dose, take only that dose. Do not take double or extra doses. What may interact with this medicine? Do not take this medicine with any of the following medications:  febuxostat  mercaptopurine This medicine may also interact with the following medications:  allopurinol  aminosalicylates like sulfasalazine, mesalamine, balsalazide, and olsalazine  leflunomide  medicines called ACE inhibitors like benazepril, captopril, enalapril, fosinopril, quinapril, lisinopril,  ramipril, and trandolapril  mycophenolate  sulfamethoxazole; trimethoprim  vaccines  warfarin This list may not describe all possible interactions. Give your health care provider a list of all the medicines, herbs, non-prescription drugs, or dietary supplements you use. Also tell them if you smoke, drink alcohol, or use illegal drugs. Some items may interact with your medicine. What should I watch for while using this medicine? Visit your doctor or health care professional for regular checks on your progress. You will need frequent blood checks during the first few months you are receiving the medicine. If you get a cold or other infection while receiving this medicine, call your doctor or health care professional. Do not treat yourself. The medicine may increase your risk of getting an infection. Women should inform their doctor if they wish to become pregnant or think they might be pregnant. There is a potential for serious side effects to an unborn child. Talk to your health care professional or pharmacist for more information. Men may have a reduced sperm count while they are taking this medicine. Talk to your health care professional for more information. This medicine may increase your risk of getting certain kinds of cancer. Talk to your doctor about healthy lifestyle choices, important screenings, and your risk. What side effects may I notice from receiving this medicine? Side effects that you should report to your doctor or health care professional as soon as possible:  allergic reactions like skin rash, itching or hives, swelling of the face, lips, or tongue  changes in vision  confusion  fever, chills, or any other sign of infection  loss of balance or coordination  severe stomach pain  unusual bleeding, bruising  unusually weak or tired  vomiting  yellowing of the eyes or skin Side effects that usually do not require medical attention (report to your doctor or health  care professional if they continue or are bothersome):  hair loss  nausea This list may not describe all possible side effects. Call your doctor for medical advice about side effects. You may report side effects to FDA at 1-800-FDA-1088. Where should I keep my medicine? Keep out of the reach of children. Store at room temperature between 15 and 25 degrees C (59 and 77 degrees F). Protect from light. Throw away any unused medicine after the expiration date. NOTE: This sheet is a summary. It may not cover all possible information. If you have questions about this medicine, talk to your doctor, pharmacist, or health care provider.  2021 Elsevier/Gold Standard (2013-04-18 12:00:31)  Standing Labs We placed an order today for your standing lab work.   Please have your standing labs drawn in 2 weeks after starting Imuran and then every 2 months  If possible, please have your labs drawn 2 weeks prior to your appointment so that the provider can discuss your results at your appointment.  We have open lab daily Monday through Thursday from 1:30-4:30 PM and Friday from 1:30-4:00 PM at the office of Dr. Pollyann Savoy, [facility].   Please be advised, all patients with office appointments requiring lab work will take precedents over walk-in lab work.  If possible, please come for your lab work on Monday and Friday afternoons, as you may experience shorter wait times. The office is located at [address], [place], [place] [address] No appointment is necessary.   Labs are drawn by [facility]. Please bring your co-pay at the time of your lab draw.  You may receive a bill from [facility] for your lab work.  If you wish to have your labs drawn at another location, please call the office 24 hours in advance to send orders.  If you have any questions regarding directions or hours of operation,  please call [phone].   As a reminder, please drink plenty of water prior to coming  for your lab work. Thanks!   COVID-19 vaccine  recommendations: COVID-19 vaccine is recommended for everyone (unless you are allergic to a vaccine component), even if you are on a medication that suppresses your immune system.   If you are on Methotrexate, Cellcept (mycophenolate), Rinvoq, Harriette Ohara, and Olumiant- hold the medication for 1 week after each vaccine. Hold Methotrexate for 2 weeks after the single dose COVID-19 vaccine.   If you are on Orencia subcutaneous injection - hold medication one week prior to and one week after the first COVID-19 vaccine dose (only).   If you are on Orencia IV infusions- time vaccination administration so that the first COVID-19 vaccination will occur four weeks after the infusion and postpone the subsequent infusion by one week.   If you are on Cyclophosphamide or Rituxan infusions please contact your doctor prior to receiving the COVID-19 vaccine.   Do not take Tylenol or any anti-inflammatory medications (NSAIDs) 24 hours prior to the COVID-19 vaccination.   There is no direct evidence about the efficacy of the COVID-19 vaccine in individuals who are on medications that suppress the immune system.   Even if you are fully vaccinated, and you are on any medications that suppress your immune system, please continue to wear a mask, maintain at least six feet social  distance and practice hand hygiene.   If you develop a COVID-19 infection, please contact your PCP or our office to determine if you need monoclonal antibody infusion.  The booster vaccine is now available for immunocompromised patients.   Please see the following web sites for updated information.   [email or web address]  Vaccines You are taking a medication(s) that can suppress your immune system.  The following immunizations are recommended: . Flu annually . Covid-19  . Pneumonia (Pneumovax 23 and Prevnar 13 spaced at  least 1 year apart) . Shingrix (after age 4)  Please check with your PCP to make sure you are up to date.  Heart Disease Prevention   Your inflammatory disease increases your risk of heart disease which includes heart attack, stroke, atrial fibrillation (irregular heartbeats), high blood pressure, heart failure and atherosclerosis (plaque in the arteries).  It is important to reduce your risk by:   . Keep blood pressure, cholesterol, and blood sugar at healthy levels   . Smoking Cessation   . Maintain a healthy weight  o BMI 20-25   . Eat a healthy diet  o Plenty of fresh fruit, vegetables, and whole grains  o Limit saturated fats, foods high in sodium, and added sugars  o DASH and Mediterranean diet   . Increase physical activity  o Recommend moderate physically activity for 150 minutes per week/ 30 minutes a day for five days a week These can be broken up into three separate ten-minute sessions during the day.   . Reduce Stress  . Meditation, slow breathing exercises, yoga, coloring books  . Dental visits twice a year

## 2020-03-01 NOTE — Telephone Encounter (Signed)
Pending clearance from Dr. Candise Che, patient will be re-starting Imuran per Dr. Corliss Skains. (Dr. Corliss Skains has sent Dr. Candise Che a message).   Consent obtained and sent to the scan center. Thanks!

## 2020-03-08 NOTE — Telephone Encounter (Signed)
Patient advised Dr. Corliss Skains received a response from Dr. Candise Che.  He suggested that patient may continue on Rituxan or she can try low-dose CellCept <1000 mg/day.  Patient may continue with Dr. Candise Che to receive Rituxan otherwise at the follow-up visit we can start her on CellCept.  Please notify patient.  He suggested that CellCept will be less cytotoxic and more cost effective than Imuran.  Noted patient's appointment to discuss Cellcept.

## 2020-03-08 NOTE — Telephone Encounter (Signed)
Attempted to contact the patient and left message for patient to call the office.  

## 2020-03-08 NOTE — Telephone Encounter (Signed)
I received a response from Dr. Candise Che.  He suggested that patient may continue on Rituxan or she can try low-dose CellCept <1000 mg/day.  Patient may continue with Dr. Candise Che to receive Rituxan otherwise at the follow-up visit we can start her on CellCept.  Please notify patient.  He suggested that CellCept will be less cytotoxic and more cost effective than Imuran.   Pollyann Savoy, MD

## 2020-03-13 ENCOUNTER — Telehealth: Payer: Self-pay | Admitting: *Deleted

## 2020-03-13 NOTE — Telephone Encounter (Signed)
Patient called - LVM requesting to schedule appt. Called patient, left voice mail that schedule message sent. Per Dr. Candise Che - schedule for first available appt with labs

## 2020-03-14 ENCOUNTER — Other Ambulatory Visit: Payer: Self-pay | Admitting: Internal Medicine

## 2020-03-14 ENCOUNTER — Telehealth: Payer: Self-pay | Admitting: Hematology

## 2020-03-14 ENCOUNTER — Encounter: Payer: Self-pay | Admitting: Internal Medicine

## 2020-03-14 ENCOUNTER — Ambulatory Visit (INDEPENDENT_AMBULATORY_CARE_PROVIDER_SITE_OTHER)
Admission: RE | Admit: 2020-03-14 | Discharge: 2020-03-14 | Disposition: A | Discharge status: Home / Self Care | Payer: Medicare HMO | Source: Ambulatory Visit | Attending: Internal Medicine | Admitting: Internal Medicine

## 2020-03-14 ENCOUNTER — Other Ambulatory Visit: Payer: Self-pay

## 2020-03-14 ENCOUNTER — Inpatient Hospital Stay
Admission: RE | Admit: 2020-03-14 | Discharge: 2020-03-14 | Disposition: A | Discharge status: Home / Self Care | Payer: Medicare HMO | Source: Ambulatory Visit | Attending: Internal Medicine | Admitting: Internal Medicine

## 2020-03-14 ENCOUNTER — Ambulatory Visit (INDEPENDENT_AMBULATORY_CARE_PROVIDER_SITE_OTHER): Payer: Medicare HMO | Admitting: Internal Medicine

## 2020-03-14 VITALS — BP 128/90 | HR 108 | Temp 99.4°F | Resp 18 | Ht 63.0 in | Wt 194.6 lb

## 2020-03-14 DIAGNOSIS — Z0000 Encounter for general adult medical examination without abnormal findings: Secondary | ICD-10-CM

## 2020-03-14 DIAGNOSIS — E2839 Other primary ovarian failure: Secondary | ICD-10-CM

## 2020-03-14 DIAGNOSIS — Z23 Encounter for immunization: Secondary | ICD-10-CM

## 2020-03-14 DIAGNOSIS — M8580 Other specified disorders of bone density and structure, unspecified site: Secondary | ICD-10-CM | POA: Diagnosis not present

## 2020-03-14 DIAGNOSIS — R7303 Prediabetes: Secondary | ICD-10-CM

## 2020-03-14 DIAGNOSIS — M329 Systemic lupus erythematosus, unspecified: Secondary | ICD-10-CM

## 2020-03-14 NOTE — Patient Instructions (Addendum)
We have given you the pneumonia shot today and will get the bone density test.  Health Maintenance, Female Adopting a healthy lifestyle and getting preventive care are important in promoting health and wellness. Ask your health care provider about:  The right schedule for you to have regular tests and exams.  Things you can do on your own to prevent diseases and keep yourself healthy. What should I know about diet, weight, and exercise? Eat a healthy diet  Eat a diet that includes plenty of vegetables, fruits, low-fat dairy products, and lean protein.  Do not eat a lot of foods that are high in solid fats, added sugars, or sodium.   Maintain a healthy weight Body mass index (BMI) is used to identify weight problems. It estimates body fat based on height and weight. Your health care provider can help determine your BMI and help you achieve or maintain a healthy weight. Get regular exercise Get regular exercise. This is one of the most important things you can do for your health. Most adults should:  Exercise for at least 150 minutes each week. The exercise should increase your heart rate and make you sweat (moderate-intensity exercise).  Do strengthening exercises at least twice a week. This is in addition to the moderate-intensity exercise.  Spend less time sitting. Even light physical activity can be beneficial. Watch cholesterol and blood lipids Have your blood tested for lipids and cholesterol at 67 years of age, then have this test every 5 years. Have your cholesterol levels checked more often if:  Your lipid or cholesterol levels are high.  You are older than 67 years of age.  You are at high risk for heart disease. What should I know about cancer screening? Depending on your health history and family history, you may need to have cancer screening at various ages. This may include screening for:  Breast cancer.  Cervical cancer.  Colorectal cancer.  Skin cancer.  Lung  cancer. What should I know about heart disease, diabetes, and high blood pressure? Blood pressure and heart disease  High blood pressure causes heart disease and increases the risk of stroke. This is more likely to develop in people who have high blood pressure readings, are of African descent, or are overweight.  Have your blood pressure checked: ? Every 3-5 years if you are 70-55 years of age. ? Every year if you are 52 years old or older. Diabetes Have regular diabetes screenings. This checks your fasting blood sugar level. Have the screening done:  Once every three years after age 37 if you are at a normal weight and have a low risk for diabetes.  More often and at a younger age if you are overweight or have a high risk for diabetes. What should I know about preventing infection? Hepatitis B If you have a higher risk for hepatitis B, you should be screened for this virus. Talk with your health care provider to find out if you are at risk for hepatitis B infection. Hepatitis C Testing is recommended for:  Everyone born from [birth year] through 1967.  Anyone with known risk factors for hepatitis C. Sexually transmitted infections (STIs)  Get screened for STIs, including gonorrhea and chlamydia, if: ? You are sexually active and are younger than 67 years of age. ? You are older than 67 years of age and your health care provider tells you that you are at risk for this type of infection. ? Your sexual activity has changed since you were last  screened, and you are at increased risk for chlamydia or gonorrhea. Ask your health care provider if you are at risk.  Ask your health care provider about whether you are at high risk for HIV. Your health care provider may recommend a prescription medicine to help prevent HIV infection. If you choose to take medicine to prevent HIV, you should first get tested for HIV. You should then be tested every 3 months for as long as you are taking the  medicine. Pregnancy  If you are about to stop having your period (premenopausal) and you may become pregnant, seek counseling before you get pregnant.  Take 400 to 800 micrograms (mcg) of folic acid every day if you become pregnant.  Ask for birth control (contraception) if you want to prevent pregnancy. Osteoporosis and menopause Osteoporosis is a disease in which the bones lose minerals and strength with aging. This can result in bone fractures. If you are 65 years old or older, or if you are at risk for osteoporosis and fractures, ask your health care provider if you should:  Be screened for bone loss.  Take a calcium or vitamin D supplement to lower your risk of fractures.  Be given hormone replacement therapy (HRT) to treat symptoms of menopause. Follow these instructions at home: Lifestyle  Do not use any products that contain nicotine or tobacco, such as cigarettes, e-cigarettes, and chewing tobacco. If you need help quitting, ask your health care provider.  Do not use street drugs.  Do not share needles.  Ask your health care provider for help if you need support or information about quitting drugs. Alcohol use  Do not drink alcohol if: ? Your health care provider tells you not to drink. ? You are pregnant, may be pregnant, or are planning to become pregnant.  If you drink alcohol: ? Limit how much you use to 0-1 drink a day. ? Limit intake if you are breastfeeding.  Be aware of how much alcohol is in your drink. In the [place] one drink equals one 12 oz bottle of beer (355 mL), one 5 oz glass of wine (148 mL), or one 1 oz glass of hard liquor (44 mL). General instructions  Schedule regular health, dental, and eye exams.  Stay current with your vaccines.  Tell your health care provider if: ? You often feel depressed. ? You have ever been abused or do not feel safe at home. Summary  Adopting a healthy lifestyle and getting preventive care are important in  promoting health and wellness.  Follow your health care provider's instructions about healthy diet, exercising, and getting tested or screened for diseases.  Follow your health care provider's instructions on monitoring your cholesterol and blood pressure. This information is not intended to replace advice given to you by your health care provider. Make sure you discuss any questions you have with your health care provider. Document Revised: 12/15/2017 Document Reviewed: 12/15/2017 Elsevier Patient Education  2021 Elsevier Inc.  

## 2020-03-14 NOTE — Progress Notes (Signed)
   Subjective:   Patient ID: Elaine Allen, female    DOB: 1955-03-01, 65 y.o.   MRN: [id number]  HPI The patient is a 65 YO female comnig in for physical.  PMH, FMH and social history reviewed and updated  Review of Systems  Constitutional: Negative.   HENT: Negative.   Eyes: Negative.   Respiratory: Negative for cough, chest tightness and shortness of breath.   Cardiovascular: Negative for chest pain, palpitations and leg swelling.  Gastrointestinal: Negative for abdominal distention, abdominal pain, constipation, diarrhea, nausea and vomiting.  Musculoskeletal: Positive for arthralgias.       Imbalance  Skin: Negative.   Neurological: Negative.   Psychiatric/Behavioral: Negative.     Objective:  Physical Exam Constitutional:      Appearance: She is well-developed.  HENT:     Head: Normocephalic and atraumatic.  Cardiovascular:     Rate and Rhythm: Normal rate and regular rhythm.  Pulmonary:     Effort: Pulmonary effort is normal. No respiratory distress.     Breath sounds: Normal breath sounds. No wheezing or rales.  Abdominal:     General: Bowel sounds are normal. There is no distension.     Palpations: Abdomen is soft.     Tenderness: There is no abdominal tenderness. There is no rebound.  Musculoskeletal:     Cervical back: Normal range of motion.  Skin:    General: Skin is warm and dry.  Neurological:     Mental Status: She is alert and oriented to person, place, and time.     Coordination: Coordination normal.     Vitals:   03/14/20 1041  BP: 128/90  Pulse: (!) 108  Resp: 18  Temp: 99.4 F (37.4 C)  TempSrc: Oral  SpO2: 98%  Weight: 194 lb 9.6 oz (88.3 kg)  Height: 5\' 3"  (1.6 m)    This visit occurred during the SARS-CoV-2 public health emergency.  Safety protocols were in place, including screening questions prior to the visit, additional usage of staff PPE, and extensive cleaning of exam room while observing appropriate contact time as  indicated for disinfecting solutions.   Assessment & Plan:  Pneumonia 23 given at visit.

## 2020-03-14 NOTE — Telephone Encounter (Signed)
Scheduled per 03/09 scheduled message, called patient and left a voicemail. Calender will be mailed.

## 2020-03-15 DIAGNOSIS — Z0000 Encounter for general adult medical examination without abnormal findings: Secondary | ICD-10-CM | POA: Insufficient documentation

## 2020-03-15 NOTE — Assessment & Plan Note (Signed)
Ordered DEXA to evaluate.

## 2020-03-15 NOTE — Assessment & Plan Note (Signed)
Recent labs stable. Will need monitoring as she is starting back on prednisone 5 mg daily.

## 2020-03-15 NOTE — Assessment & Plan Note (Signed)
Recent addition of prednisone 5 mg daily which hopefully will help with her symptoms.

## 2020-03-15 NOTE — Assessment & Plan Note (Signed)
Flu shot declines. Covid-19 2 shots booster due counseled. Pneumonia 23 given to complete series. Shingrix counseled. Tetanus due 2026. Colonoscopy due 2026. Mammogram due 2023, pap smear aged out and dexa due ordered. Counseled about sun safety and mole surveillance. Counseled about the dangers of distracted driving. Given 10 year screening recommendations.

## 2020-03-20 ENCOUNTER — Ambulatory Visit: Payer: Medicare (Managed Care) | Admitting: Rheumatology

## 2020-03-21 NOTE — Progress Notes (Signed)
HEMATOLOGY/ONCOLOGY CLINIC NOTE  Date of Service: 03/22/2020  Patient Care Team: Myrlene Brokerrawford, Elizabeth A, MD as PCP - General (Internal Medicine)  REFERRING PHYSICIAN: Myrlene Brokerrawford, Elizabeth A, *  CHIEF COMPLAINTS/PURPOSE OF CONSULTATION:  F/u for mx of ITP   INTERVAL HISTORY  Elaine Allen is a wonderful 65 y.o. female who is here today for f/u for evaluation and management of Thrombocytopenia. The patient' was lost to follow up and her last visit with us was on 04/14/2019. The pt reports that she is doing well overall. She received her 4/4 infusion on 04/20/2020.  The pt reports that she is still extremely tired and experiencing flares due to her Lupus. She has not been on any medications over the last year until two weeks ago when the pt was put on Prednisone. The pt notes she felt the Rituxan helped her Lupus symptoms. They started to return two weeks after the last infusion. The pt notes that she has been improving since being on the Prednisone these last two weeks. She is currently on 5 mg.  Lab results today 03/22/2020 of CBC w/diff and CMP is as follows: all values are WNL except for Hgb of 11.8, HCT of 35.4, Plt of 110K. CMP pending. 03/22/2020 Immature Plt Fract of 7.6. 03/22/2020 Vitamin B12 is 455 03/22/2020 Retic Ct Pct of 1.8, Immature Retic Fract of 24.4.  On review of systems, pt reports fatigue, joint stiffness, muscle pain and denies infection issues, sudden weight loss, new lumps/bumps, abdominal pain, back pain, leg swelling, and any other symptoms.    MEDICAL HISTORY:  Past Medical History:  Diagnosis Date  . Arthritis   . Blind right eye    sees colors  . Chronic anemia   . Detached retina, right    corrected good for about 6 months sees peripheral colors only  . History of kidney stones   . Hypertension   . Hyperthyroidism   . Lupus (HCC)   . Pneumonia   . Retinal detachment   . Spasmodic dysphonia   . Stroke (HCC) 2015   no permanant defecits  had weakness in left arm in hospital Had PT also  . Vertigo    Hx of preceding stroke     SURGICAL HISTORY: Past Surgical History:  Procedure Laterality Date  . ABDOMINAL HYSTERECTOMY    . BREAST LUMPECTOMY     left breast benign  . KNEE SURGERY Left   . THYROID SURGERY    . TOTAL HIP ARTHROPLASTY Left 06/27/2018   Procedure: TOTAL HIP ARTHROPLASTY ANTERIOR APPROACH;  Surgeon: Gean Birchwoodowan, Frank, MD;  Location: [facility];  Service: Orthopedics;  Laterality: Left;     SOCIAL HISTORY: Social History   Socioeconomic History  . Marital status: Single    Spouse name: Not on file  . Number of children: 2  . Years of education: college  . Highest education level: Not on file  Occupational History  . Occupation: options  Tobacco Use  . Smoking status: Never Smoker  . Smokeless tobacco: Never Used  Vaping Use  . Vaping Use: Never used  Substance and Sexual Activity  . Alcohol use: No  . Drug use: No  . Sexual activity: Not Currently  Other Topics Concern  . Not on file  Social History Narrative  . Not on file   Social Determinants of Health   Financial Resource Strain: Not on file  Food Insecurity: Not on file  Transportation Needs: Not on file  Physical Activity: Not on file  Stress: Not on file  Social Connections: Not on file  Intimate Partner Violence: Not on file     FAMILY HISTORY: Family History  Problem Relation Age of Onset  . Cancer Mother        liver  . Diabetes Mother   . Arthritis Mother   . Cancer Father        prostate,METS, stomach   . Ovarian cancer Sister   . Throat cancer Brother      ALLERGIES:   is allergic to plaquenil [hydroxychloroquine sulfate], azithromycin, lactose intolerance (gi), and penicillin g.   MEDICATIONS:  Current Outpatient Medications  Medication Sig Dispense Refill  . Cholecalciferol (VITAMIN D3 PO) Take 1 tablet by mouth daily.    Marland Kitchen losartan-hydrochlorothiazide (HYZAAR) 100-25 MG tablet TAKE 1 TABLET BY MOUTH DAILY  30 tablet 5  . predniSONE (DELTASONE) 5 MG tablet Take 1 tablet (5 mg total) by mouth daily with breakfast. 30 tablet 1  . Probiotic Product (PROBIOTIC PO) Take by mouth.    . vitamin B-12 (CYANOCOBALAMIN) 1000 MCG tablet Take 1,000 mcg by mouth daily.    Marland Kitchen VITAMIN D PO Take by mouth daily.     No current facility-administered medications for this visit.    REVIEW OF SYSTEMS:   10 Point review of Systems was done is negative except as noted above.  PHYSICAL EXAMINATION: ECOG PERFORMANCE STATUS: 1 - Symptomatic but completely ambulatory  Vitals:   03/22/20 1220  BP: 125/77  Pulse: 94  Resp: 18  Temp: 98.6 F (37 C)  SpO2: 100%   Filed Weights   03/22/20 1220  Weight: 196 lb 6 oz (89.1 kg)   Body mass index is 34.79 kg/m.   GENERAL:alert, in no acute distress and comfortable SKIN: no acute rashes, no significant lesions EYES: conjunctiva are pink and non-injected, sclera anicteric OROPHARYNX: MMM, no exudates, no oropharyngeal erythema or ulceration NECK: supple, no JVD LYMPH:  no palpable lymphadenopathy in the cervical, axillary or inguinal regions LUNGS: clear to auscultation b/l with normal respiratory effort HEART: regular rate & rhythm ABDOMEN:  normoactive bowel sounds , non tender, not distended. Extremity: no pedal edema PSYCH: alert & oriented x 3 with fluent speech NEURO: no focal motor/sensory deficits   LABORATORY DATA:  I have reviewed the data as listed  CBC Latest Ref Rng & Units 03/22/2020 01/31/2020 04/21/2019  WBC 4.0 - 10.5 K/uL 4.6 2.4(L) 3.3(L)  Hemoglobin 12.0 - 15.0 g/dL 11.8(L) 13.1 13.0  Hematocrit 36.0 - 46.0 % 35.4(L) 39.5 39.1  Platelets 150 - 400 K/uL 110(L) 153 176    CMP Latest Ref Rng & Units 01/31/2020 01/31/2020 04/21/2019  Glucose 65 - 99 mg/dL - 552(C) 802(M)  BUN 7 - 25 mg/dL - 17 12  Creatinine 3.36 - 0.99 mg/dL - 1.22(E) 4.97  Sodium 135 - 146 mmol/L - 140 144  Potassium 3.5 - 5.3 mmol/L - 3.7 3.7  Chloride 98 - 110 mmol/L -  104 111  CO2 20 - 32 mmol/L - 25 21(L)  Calcium 8.6 - 10.4 mg/dL - 9.4 9.0  Total Protein 6.1 - 8.1 g/dL 7.7 7.6 7.8  Total Bilirubin 0.2 - 1.2 mg/dL - 0.5 0.7  Alkaline Phos 38 - 126 U/L - - 110  AST 10 - 35 U/L - 23 18  ALT 6 - 29 U/L - 16 13   . Lab Results  Component Value Date   LDH 173 04/14/2019    Component     Latest Ref Rng & Units  03/09/2019  Retic Ct Pct     0.4 - 3.1 % 1.4  RBC.     3.87 - 5.11 MIL/uL 4.44  Retic Count, Absolute     19.0 - 186.0 K/uL 59.9  Immature Retic Fract     2.3 - 15.9 % 13.0  Beta-2 Glycoprotein I Ab, IgG     0 - 20 GPI IgG units <9  Beta-2-Glycoprotein I IgM     0 - 32 GPI IgM units <9  Beta-2-Glycoprotein I IgA     0 - 25 GPI IgA units <9  PTT Lupus Anticoagulant     0.0 - 51.9 sec 47.4  DRVVT     0.0 - 47.0 sec 57.5 (H)  Lupus Anticoag Interp      Comment:  Anticardiolipin Ab,IgG,Qn     0 - 14 GPL U/mL <9  Anticardiolipin Ab,IgM,Qn     0 - 12 MPL U/mL <9  Anticardiolipin Ab,IgA,Qn     0 - 11 APL U/mL <9  Vitamin B12     180 - 914 pg/mL 203  Hepatitis B Surface Ag     NON REACTIVE NON REACTIVE  Hep B Core Total Ab     NON REACTIVE NON REACTIVE  HCV Ab     NON REACTIVE NON REACTIVE  Platelet CT in Citrate      PLATELET COUNT PERFORMED ON CITRATED BLOOD  Immature Platelet Fraction     1.2 - 8.6 % 17.2 (H)  LDH     98 - 192 U/L 186   Lupus anticoagulant panel      The value has a corrected status.      No reference range information available      Resulting Lab: [facility]      Comments:           (NOTE)           Results are consistent with the presence of a lupus            anticoagulant.     RADIOGRAPHIC STUDIES: I have personally reviewed the radiological images as listed and agreed with the findings in the report. DG BONE DENSITY (DXA)  Result Date: 03/16/2020 Date of study: 03/15/20 Exam: DUAL X-RAY ABSORPTIOMETRY (DXA) FOR BONE MINERAL DENSITY (BMD) Instrument: [employer]  Requesting Provider: PCP Indication: follow up for low BMD Comparison: none (please note that it is not possible to compare data from different instruments) Clinical data: Pt is a 65 y.o. female without  previous history of fracture. On calcium and vitamin D.  Takes prednisone. Results:  Lumbar spine L1-L4 Femoral neck (FN) 33% distal radius T-score -1.7 RFN: -1.4 LFN:  n/a Change in BMD from previous DXA test (%) n/a n/a n/a (*) statistically significant Assessment: the BMD is low according to the [facility] classification for osteoporosis (see below). Fracture risk: moderate FRAX score: 10 year major osteoporotic risk: 3.8%. 10 year hip fracture risk: 0.4%. The thresholds for treatment are 20% and 3%, respectively. Comments: the technical quality of the study is good. Evaluation for secondary causes should be considered if clinically indicated. Recommend optimizing calcium (1200 mg/day) and vitamin D (800 IU/day) intake. Followup: Repeat BMD is appropriate after 2 years or after 1-2 years if starting treatment. WHO criteria for diagnosis of osteoporosis in postmenopausal women and in men 31 y/o or older: - normal: T-score -1.0 to + 1.0 - osteopenia/low bone density: T-score between -2.5 and -1.0 - osteoporosis: T-score below -2.5 - severe  osteoporosis: T-score below -2.5 with history of fragility fracture Note: although not part of the WHO classification, the presence of a fragility fracture, regardless of the T-score, should be considered diagnostic of osteoporosis, provided other causes for the fracture have been excluded. Treatment: The National Osteoporosis Foundation recommends that treatment be considered in postmenopausal women and men age 29 or older with: 1. Hip or vertebral (clinical or morphometric) fracture 2. T-score of - 2.5 or lower at the spine or hip 3. 10-year fracture probability by FRAX of at least 20% for a major osteoporotic fracture and 3% for a hip fracture Roxy Manns MD     ASSESSMENT &  PLAN:   Elaine Allen is a 65 y.o. female with:  1. Thrombocytopenia - likely related to Immune thrombocytopenia in the setting of lupus. No evidence of TTP  Partly thrombocytopenia was related to previous use of Imuran. Platelet counts seem to have dropped in tandem with decreasing doses of steroids.  2. Lupus - on Imuran  3. H/o AVN - related to steroids s/p left THA.  4. Lupus anticoagulant positive  PLAN: -Discussed pt labwork today, 03/22/2020; Plt at 110K. Other counts stable.  -Advised pt we can only give her the Rituxan here for the Immune thrombocytopenia, not for the pt's Lupus.  - no indication for active treatment of Immune thrombocytopenia at this time since platelets >30k -The pt is aware to contact Dr. Corliss Skains for optimization of her lupus treatment which should help keep her ITP in control as well.. -Advised pt that the more controlled the Lupus, the better the Plt counts should be. The treatment to control Lupus should hopefully control the Plt counts as well. -Continue OTC Vitamin B12 supplement 1000 mcg  -Continue to f/u w Rheumatologist   -Will see back as needed if Plt <75K. Pt will follow labs w Dr. Corliss Skains.   FOLLOW UP RTC with Dr Candise Che as needed F/u with Rheumatology -- Dr Corliss Skains.    The total time spent in the appointment was 20 minutes and more than 50% was on counseling and direct patient cares.    All of the patient's questions were answered with apparent satisfaction. The patient knows to call the clinic with any problems, questions or concerns.   Wyvonnia Lora MD MS AAHIVMS [facility] Hematology/Oncology Physician [facility]  (Office):       [phone] (Work cell):  [phone] (Fax):           [phone]  03/22/2020 12:46 PM   I, Minda Meo, am acting as scribe for Dr. Wyvonnia Lora, MD.    .I have reviewed the above documentation for accuracy and completeness, and I agree with the above. Johney Maine  MD

## 2020-03-22 ENCOUNTER — Inpatient Hospital Stay: Payer: Medicare HMO | Admitting: Hematology

## 2020-03-22 ENCOUNTER — Inpatient Hospital Stay: Payer: Medicare HMO | Attending: Hematology

## 2020-03-22 ENCOUNTER — Other Ambulatory Visit: Payer: Self-pay

## 2020-03-22 VITALS — BP 125/77 | HR 94 | Temp 98.6°F | Resp 18 | Ht 63.0 in | Wt 196.4 lb

## 2020-03-22 DIAGNOSIS — I10 Essential (primary) hypertension: Secondary | ICD-10-CM | POA: Insufficient documentation

## 2020-03-22 DIAGNOSIS — Z7952 Long term (current) use of systemic steroids: Secondary | ICD-10-CM | POA: Diagnosis not present

## 2020-03-22 DIAGNOSIS — Z8673 Personal history of transient ischemic attack (TIA), and cerebral infarction without residual deficits: Secondary | ICD-10-CM | POA: Insufficient documentation

## 2020-03-22 DIAGNOSIS — D693 Immune thrombocytopenic purpura: Secondary | ICD-10-CM

## 2020-03-22 DIAGNOSIS — Z79899 Other long term (current) drug therapy: Secondary | ICD-10-CM | POA: Insufficient documentation

## 2020-03-22 LAB — CBC WITH DIFFERENTIAL (CANCER CENTER ONLY)
Abs Immature Granulocytes: 0.01 10*3/uL (ref 0.00–0.07)
Basophils Absolute: 0 10*3/uL (ref 0.0–0.1)
Basophils Relative: 1 %
Eosinophils Absolute: 0.1 10*3/uL (ref 0.0–0.5)
Eosinophils Relative: 2 %
HCT: 35.4 % — ABNORMAL LOW (ref 36.0–46.0)
Hemoglobin: 11.8 g/dL — ABNORMAL LOW (ref 12.0–15.0)
Immature Granulocytes: 0 %
Lymphocytes Relative: 41 %
Lymphs Abs: 1.9 10*3/uL (ref 0.7–4.0)
MCH: 29.5 pg (ref 26.0–34.0)
MCHC: 33.3 g/dL (ref 30.0–36.0)
MCV: 88.5 fL (ref 80.0–100.0)
Monocytes Absolute: 0.3 10*3/uL (ref 0.1–1.0)
Monocytes Relative: 7 %
Neutro Abs: 2.2 10*3/uL (ref 1.7–7.7)
Neutrophils Relative %: 49 %
Platelet Count: 110 10*3/uL — ABNORMAL LOW (ref 150–400)
RBC: 4 MIL/uL (ref 3.87–5.11)
RDW: 13.9 % (ref 11.5–15.5)
WBC Count: 4.6 10*3/uL (ref 4.0–10.5)
nRBC: 0 % (ref 0.0–0.2)

## 2020-03-22 LAB — CMP (CANCER CENTER ONLY)
ALT: 23 U/L (ref 0–44)
AST: 23 U/L (ref 15–41)
Albumin: 3.7 g/dL (ref 3.5–5.0)
Alkaline Phosphatase: 100 U/L (ref 38–126)
Anion gap: 7 (ref 5–15)
BUN: 15 mg/dL (ref 8–23)
CO2: 26 mmol/L (ref 22–32)
Calcium: 9.2 mg/dL (ref 8.9–10.3)
Chloride: 106 mmol/L (ref 98–111)
Creatinine: 0.95 mg/dL (ref 0.44–1.00)
GFR, Estimated: >60 mL/min (ref >60)
Glucose, Bld: 137 mg/dL — ABNORMAL HIGH (ref 70–99)
Potassium: 3 mmol/L — ABNORMAL LOW (ref 3.5–5.1)
Sodium: 139 mmol/L (ref 135–145)
Total Bilirubin: 0.6 mg/dL (ref 0.3–1.2)
Total Protein: 7.6 g/dL (ref 6.5–8.1)

## 2020-03-22 LAB — RETICULOCYTES
Immature Retic Fract: 24.4 % — ABNORMAL HIGH (ref 2.3–15.9)
RBC.: 4.07 MIL/uL (ref 3.87–5.11)
Retic Count, Absolute: 73.3 10*3/uL (ref 19.0–186.0)
Retic Ct Pct: 1.8 % (ref 0.4–3.1)

## 2020-03-22 LAB — IMMATURE PLATELET FRACTION: Immature Platelet Fraction: 7.6 % (ref 1.2–8.6)

## 2020-03-22 LAB — VITAMIN B12: Vitamin B-12: 455 pg/mL (ref 180–914)

## 2020-03-25 ENCOUNTER — Telehealth: Payer: Self-pay | Admitting: Hematology

## 2020-03-25 DIAGNOSIS — Z96651 Presence of right artificial knee joint: Secondary | ICD-10-CM | POA: Diagnosis not present

## 2020-03-25 DIAGNOSIS — M1711 Unilateral primary osteoarthritis, right knee: Secondary | ICD-10-CM | POA: Diagnosis not present

## 2020-03-25 DIAGNOSIS — Z880 Allergy status to penicillin: Secondary | ICD-10-CM | POA: Diagnosis not present

## 2020-03-25 DIAGNOSIS — Z888 Allergy status to other drugs, medicaments and biological substances status: Secondary | ICD-10-CM | POA: Diagnosis not present

## 2020-03-25 DIAGNOSIS — M25561 Pain in right knee: Secondary | ICD-10-CM | POA: Diagnosis not present

## 2020-03-25 DIAGNOSIS — M329 Systemic lupus erythematosus, unspecified: Secondary | ICD-10-CM | POA: Diagnosis not present

## 2020-03-25 NOTE — Telephone Encounter (Signed)
Checked out appointment. No LOS notes needing to be scheduled. No changes made. 

## 2020-04-17 NOTE — Progress Notes (Signed)
Office Visit Note  Patient: Elaine Allen             Date of Birth: 1955/06/02           MRN: [id number]             PCP: Myrlene Broker, MD Referring: Myrlene Broker, * Visit Date: 05/01/2020 Occupation: @GUAROCC @  Subjective:  Pain in multiple joints.   History of Present Illness: Elaine Allen is a 65 y.o. female with a history of systemic lupus erythematosus, osteoarthritis and degenerative disc disease.  She was treated by Dr. 71 for thrombocytopenia with Rituxan.  She continues to have some discomfort in her shoulders, hands and her feet.  She also has some discomfort in her knee joints.  She denies any joint swelling.  She continues to have some nasal ulcers.  She denies any sicca symptoms, Raynaud's phenomenon.  She has history of hair loss which persist.  Activities of Daily Living:  Patient reports morning stiffness for all day. Patient Reports nocturnal pain.  Difficulty dressing/grooming: Denies Difficulty climbing stairs: Reports Difficulty getting out of chair: Reports Difficulty using hands for taps, buttons, cutlery, and/or writing: Reports  Review of Systems  Constitutional: Positive for fatigue. Negative for night sweats, weight gain and weight loss.  HENT: Negative for mouth sores, trouble swallowing, trouble swallowing, mouth dryness and nose dryness.        Nose sores   Eyes: Negative for pain, redness, itching, visual disturbance and dryness.  Respiratory: Negative for cough, shortness of breath and difficulty breathing.   Cardiovascular: Negative for chest pain, palpitations, hypertension, irregular heartbeat and swelling in legs/feet.  Gastrointestinal: Negative for blood in stool, constipation and diarrhea.  Endocrine: Negative for increased urination.  Genitourinary: Negative for difficulty urinating and vaginal dryness.  Musculoskeletal: Positive for arthralgias, joint pain, myalgias, morning stiffness, muscle tenderness and  myalgias. Negative for joint swelling and muscle weakness.  Skin: Positive for hair loss. Negative for color change, rash, redness, skin tightness, ulcers and sensitivity to sunlight.  Allergic/Immunologic: Negative for susceptible to infections.  Neurological: Negative for dizziness, numbness, headaches, memory loss, night sweats and weakness.  Hematological: Negative for bruising/bleeding tendency and swollen glands.  Psychiatric/Behavioral: Negative for depressed mood, confusion and sleep disturbance. The patient is not nervous/anxious.     PMFS History:  Patient Active Problem List   Diagnosis Date Noted  . Routine general medical examination at a health care facility 03/15/2020  . Immune thrombocytopenia (HCC) 03/26/2019  . AVN of femur (HCC) 06/23/2018  . Greater trochanteric bursitis of left hip 04/08/2018  . Left knee pain 04/06/2018  . Numbness in feet 09/14/2017  . Elevated LFTs 02/24/2017  . Pre-diabetes 02/19/2017  . Fatigue 02/19/2017  . OA (osteoarthritis) of hip 10/29/2016  . Numbness and tingling in left arm 05/05/2016  . Cough 12/25/2015  . Wheezing 12/25/2015  . Abdominal pain 12/25/2015  . Back pain 10/01/2015  . Insomnia 03/01/2015  . Family hx of colon cancer 11/12/2014  . Essential hypertension, benign 05/15/2013  . Other and unspecified hyperlipidemia 05/15/2013  . Vertebral artery dissection (HCC) 05/14/2013  . Hx of completed stroke 05/14/2013  . Chronic anemia   . Primary osteoarthritis of right knee 12/19/2007  . Primary hyperparathyroidism (HCC) 11/24/2007  . Osteopenia 11/24/2007  . Lupus (systemic lupus erythematosus) (HCC) 01/05/2002    Past Medical History:  Diagnosis Date  . Arthritis   . Blind right eye    sees colors  . Chronic anemia   .  Detached retina, right    corrected good for about 6 months sees peripheral colors only  . History of kidney stones   . Hypertension   . Hyperthyroidism   . Lupus (HCC)   . Pneumonia   . Retinal  detachment   . Spasmodic dysphonia   . Stroke (HCC) 2015   no permanant defecits had weakness in left arm in hospital Had PT also  . Vertigo    Hx of preceding stroke    Family History  Problem Relation Age of Onset  . Cancer Mother        liver  . Diabetes Mother   . Arthritis Mother   . Cancer Father        prostate,METS, stomach   . Ovarian cancer Sister   . Throat cancer Brother    Past Surgical History:  Procedure Laterality Date  . ABDOMINAL HYSTERECTOMY    . BREAST LUMPECTOMY     left breast benign  . KNEE SURGERY Left   . THYROID SURGERY    . TOTAL HIP ARTHROPLASTY Left 06/27/2018   Procedure: TOTAL HIP ARTHROPLASTY ANTERIOR APPROACH;  Surgeon: Gean Birchwood, MD;  Location: [facility];  Service: Orthopedics;  Laterality: Left;   Social History   Social History Narrative  . Not on file   Immunization History  Administered Date(s) Administered  . Fluad Quad(high Dose 65+) 10/21/2018  . Hep A / Hep B 03/08/2017  . Influenza Whole 12/19/2007  . Influenza,inj,Quad PF,6+ Mos 11/12/2014, 10/01/2015, 10/28/2016, 02/09/2018  . Influenza-Unspecified 11/15/2013  . PFIZER(Purple Top)SARS-COV-2 Vaccination 06/06/2019, 07/06/2019  . PPD Test 09/12/2013, 01/09/2015, 03/18/2016, 03/23/2016, 08/10/2017  . Pneumococcal Conjugate-13 10/21/2018  . Pneumococcal Polysaccharide-23 03/14/2020  . Tdap 12/05/2014  . Zoster 01/02/2015     Objective: Vital Signs: BP (!) 142/88 (BP Location: Left Arm, Patient Position: Sitting, Cuff Size: Large)   Pulse 86   Resp 15   Ht 5\' 3"  (1.6 m)   Wt 200 lb 3.2 oz (90.8 kg)   BMI 35.46 kg/m    Physical Exam Vitals and nursing note reviewed.  Constitutional:      Appearance: She is well-developed.  HENT:     Head: Normocephalic and atraumatic.  Eyes:     Conjunctiva/sclera: Conjunctivae normal.  Cardiovascular:     Rate and Rhythm: Normal rate and regular rhythm.     Heart sounds: Normal heart sounds.  Pulmonary:     Effort: Pulmonary  effort is normal.     Breath sounds: Normal breath sounds.  Abdominal:     General: Bowel sounds are normal.     Palpations: Abdomen is soft.  Musculoskeletal:     Cervical back: Normal range of motion.  Lymphadenopathy:     Cervical: No cervical adenopathy.  Skin:    General: Skin is warm and dry.     Capillary Refill: Capillary refill takes less than 2 seconds.  Neurological:     Mental Status: She is alert and oriented to person, place, and time.     Comments: Right-sided hemiparesis.  Psychiatric:        Behavior: Behavior normal.      Musculoskeletal Exam: C-spine was in good range of motion.  Shoulder joints, elbow joints, wrist joints, MCPs PIPs and DIPs with good range of motion.  She had good range of motion of her hip joints and knee joints.  There was no tenderness over ankles or MTPs.  There was no synovitis on my examination.  She had left total hip replacement  and left total knee replacement.  She also had right-sided hemiparesis due to previous stroke.  CDAI Exam: CDAI Score: -- Patient Global: --; Provider Global: -- Swollen: 0 ; Tender: 0  Joint Exam 05/01/2020   No joint exam has been documented for this visit   There is currently no information documented on the homunculus. Go to the Rheumatology activity and complete the homunculus joint exam.  Investigation: No additional findings.  Imaging: No results found.  Recent Labs: Lab Results  Component Value Date   WBC 4.6 03/22/2020   HGB 11.8 (L) 03/22/2020   PLT 110 (L) 03/22/2020   NA 139 03/22/2020   K 3.0 (L) 03/22/2020   CL 106 03/22/2020   CO2 26 03/22/2020   GLUCOSE 137 (H) 03/22/2020   BUN 15 03/22/2020   CREATININE 0.95 03/22/2020   BILITOT 0.6 03/22/2020   ALKPHOS 100 03/22/2020   AST 23 03/22/2020   ALT 23 03/22/2020   PROT 7.6 03/22/2020   ALBUMIN 3.7 03/22/2020   CALCIUM 9.2 03/22/2020   GFRAA 61 01/31/2020   QFTBGOLDPLUS NEGATIVE 01/31/2020    Speciality Comments:  PLQ-blisters all over the body  Procedures:  No procedures performed Allergies: Plaquenil [hydroxychloroquine sulfate], Azithromycin, Lactose intolerance (gi), and Penicillin g   Assessment / Plan:     Visit Diagnoses: SLE (systemic lupus erythematosus related syndrome) (HCC) - Dxd in [place]. History of rash, hives, fatigue, nasal ulcers, sicca symptoms, hair loss, Raynauds, arthralgias. +ANA,+ Ro , elevated sed rate.  She continues to have nasal ulcers, hair loss, arthralgias.  We had detailed discussion regarding different treatment options and their side effects today.  Her white cell count is normal now.  She continues to have thrombocytopenia.  As she is low metabolizer of TPMT I will start her on Imuran 25 mg p.o. daily.  We will check labs in 2 weeks x 2.  If labs are normal in 2 weeks then increase the Imuran to 50 mg p.o. daily.  We will continue to monitor labs every 2 months.  Immune thrombocytopenia (HCC) - On Rituxan by Dr. Candise Che for thrombocytopenia.  Her thrombocytopenia recovered on Rituxan.  Lupus anticoagulant positive - Cardiolipin negative, beta-2 GP 1 -.  She is not on any anticoagulation.  Have advised her to discuss that further with Dr. Candise Che.  I have advised her to take enteric-coated aspirin 81 mg p.o. daily.  High risk medication use - TPMT intermediate metabolizer.  Currently on Rituxan by Dr. Candise Che.  She has not had Rituxan since April 2021.  TPMT intermediate metabolizer (HCC)-TPMT 12  Myalgia - History of myalgias.  CK and TSH were normal.  She may have a component of myofascial pain syndrome.  Chronic pain of both shoulders - Followed by Dr. Turner Daniels.  According to patient x-ray showed arthritis.  Primary osteoarthritis of both hands - History of pain in both hands.  X-rays were consistent with osteoarthritis.  Status post total hip replacement, left - By Dr. Turner Daniels on June 27, 2018  Primary osteoarthritis of right knee-she continues to have some discomfort.   She also had right-sided hemiparesis which causes difficulty with mobility.  S/P TKR (total knee replacement), left-doing well.  Primary osteoarthritis of both feet-she continues to have some discomfort in her feet.  DDD (degenerative disc disease), lumbar - she had x-rays done by her PCP which show some degenerative changes.  Hx of completed stroke - Right-sided hemiparesis and expressive aphasia.  Vertebral artery dissection (HCC)  Spasmodic dysphonia  Essential  hypertension, benign-her blood pressure is mildly elevated.  Have advised to monitor blood pressure closely.  Pre-diabetes  Primary hyperparathyroidism (HCC)  Vitamin D deficiency-she has history of vitamin D deficiency.  Her last vitamin D was 20.  I will give her vitamin D 50,000 units once a week for 3 months.  We will recheck vitamin D level in 3 months.  She is advised to continue with the maintenance dose of vitamin D after finishing the course of vitamin D.  Orders: Orders Placed This Encounter  Procedures  . VITAMIN D 25 Hydroxy (Vit-D Deficiency, Fractures)   Meds ordered this encounter  Medications  . Vitamin D, Ergocalciferol, (DRISDOL) 1.25 MG (50000 UNIT) CAPS capsule    Sig: Take 1 capsule (50,000 Units total) by mouth every 7 (seven) days.    Dispense:  12 capsule    Refill:  0  . azaTHIOprine (IMURAN) 50 MG tablet    Sig: Take 1/2 tablet (25mg ) by mouth daily for 2 weeks, if labs are stable increase to 1 tablet (50mg ) by mouth daily.    Dispense:  21 tablet    Refill:  0      Follow-Up Instructions: Return in about 6 weeks (around 06/12/2020) for Systemic lupus, Osteoarthritis.   Pollyann SavoyShaili Sheika Coutts, MD  Note - This record has been created using Animal nutritionistDragon software.  Chart creation errors have been sought, but may not always  have been located. Such creation errors do not reflect on  the standard of medical care.

## 2020-04-24 ENCOUNTER — Ambulatory Visit: Payer: Medicare (Managed Care) | Admitting: Rheumatology

## 2020-05-01 ENCOUNTER — Other Ambulatory Visit: Payer: Self-pay

## 2020-05-01 ENCOUNTER — Ambulatory Visit: Payer: Medicare HMO | Admitting: Rheumatology

## 2020-05-01 ENCOUNTER — Encounter: Payer: Self-pay | Admitting: Rheumatology

## 2020-05-01 VITALS — BP 142/88 | HR 86 | Resp 15 | Ht 63.0 in | Wt 200.2 lb

## 2020-05-01 DIAGNOSIS — E8889 Other specified metabolic disorders: Secondary | ICD-10-CM

## 2020-05-01 DIAGNOSIS — Z96642 Presence of left artificial hip joint: Secondary | ICD-10-CM

## 2020-05-01 DIAGNOSIS — I10 Essential (primary) hypertension: Secondary | ICD-10-CM

## 2020-05-01 DIAGNOSIS — M791 Myalgia, unspecified site: Secondary | ICD-10-CM | POA: Diagnosis not present

## 2020-05-01 DIAGNOSIS — I7774 Dissection of vertebral artery: Secondary | ICD-10-CM

## 2020-05-01 DIAGNOSIS — D693 Immune thrombocytopenic purpura: Secondary | ICD-10-CM

## 2020-05-01 DIAGNOSIS — Z79899 Other long term (current) drug therapy: Secondary | ICD-10-CM

## 2020-05-01 DIAGNOSIS — E559 Vitamin D deficiency, unspecified: Secondary | ICD-10-CM

## 2020-05-01 DIAGNOSIS — M25511 Pain in right shoulder: Secondary | ICD-10-CM | POA: Diagnosis not present

## 2020-05-01 DIAGNOSIS — M329 Systemic lupus erythematosus, unspecified: Secondary | ICD-10-CM

## 2020-05-01 DIAGNOSIS — R760 Raised antibody titer: Secondary | ICD-10-CM | POA: Diagnosis not present

## 2020-05-01 DIAGNOSIS — M19041 Primary osteoarthritis, right hand: Secondary | ICD-10-CM | POA: Diagnosis not present

## 2020-05-01 DIAGNOSIS — M19072 Primary osteoarthritis, left ankle and foot: Secondary | ICD-10-CM

## 2020-05-01 DIAGNOSIS — R7303 Prediabetes: Secondary | ICD-10-CM

## 2020-05-01 DIAGNOSIS — J383 Other diseases of vocal cords: Secondary | ICD-10-CM

## 2020-05-01 DIAGNOSIS — M5136 Other intervertebral disc degeneration, lumbar region: Secondary | ICD-10-CM

## 2020-05-01 DIAGNOSIS — Z96652 Presence of left artificial knee joint: Secondary | ICD-10-CM

## 2020-05-01 DIAGNOSIS — M1711 Unilateral primary osteoarthritis, right knee: Secondary | ICD-10-CM

## 2020-05-01 DIAGNOSIS — M19071 Primary osteoarthritis, right ankle and foot: Secondary | ICD-10-CM

## 2020-05-01 DIAGNOSIS — G8929 Other chronic pain: Secondary | ICD-10-CM

## 2020-05-01 DIAGNOSIS — M19042 Primary osteoarthritis, left hand: Secondary | ICD-10-CM

## 2020-05-01 DIAGNOSIS — E210 Primary hyperparathyroidism: Secondary | ICD-10-CM

## 2020-05-01 DIAGNOSIS — Z8673 Personal history of transient ischemic attack (TIA), and cerebral infarction without residual deficits: Secondary | ICD-10-CM

## 2020-05-01 DIAGNOSIS — M25512 Pain in left shoulder: Secondary | ICD-10-CM

## 2020-05-01 MED ORDER — AZATHIOPRINE 50 MG PO TABS: ORAL_TABLET | ORAL | 0 refills | Status: DC

## 2020-05-01 MED ORDER — VITAMIN D (ERGOCALCIFEROL) 1.25 MG (50000 UNIT) PO CAPS: 50000.0000 [IU] | ORAL_CAPSULE | ORAL | 0 refills | Status: DC

## 2020-05-01 NOTE — Patient Instructions (Addendum)
Azathioprine tablets What is this medicine? AZATHIOPRINE (ay za THYE oh preen) suppresses the immune system. It is used to prevent organ rejection after a transplant. It is also used to treat rheumatoid arthritis. This medicine may be used for other purposes; ask your health care provider or pharmacist if you have questions. COMMON BRAND NAME(S): Azasan, Imuran What should I tell my health care provider before I take this medicine? They need to know if you have any of these conditions:  infection  kidney disease  liver disease  an unusual or allergic reaction to azathioprine, other medicines, lactose, foods, dyes, or preservatives  pregnant or trying to get pregnant  breast feeding How should I use this medicine? Take this medicine by mouth with a full glass of water. Follow the directions on the prescription label. Take your medicine at regular intervals. Do not take your medicine more often than directed. Continue to take your medicine even if you feel better. Do not stop taking except on your doctor's advice. Talk to your pediatrician regarding the use of this medicine in children. Special care may be needed. Overdosage: If you think you have taken too much of this medicine contact a poison control center or emergency room at once. NOTE: This medicine is only for you. Do not share this medicine with others. What if I miss a dose? If you miss a dose, take it as soon as you can. If it is almost time for your next dose, take only that dose. Do not take double or extra doses. What may interact with this medicine? Do not take this medicine with any of the following medications:  febuxostat  mercaptopurine This medicine may also interact with the following medications:  allopurinol  aminosalicylates like sulfasalazine, mesalamine, balsalazide, and olsalazine  leflunomide  medicines called ACE inhibitors like benazepril, captopril, enalapril, fosinopril, quinapril, lisinopril,  ramipril, and trandolapril  mycophenolate  sulfamethoxazole; trimethoprim  vaccines  warfarin This list may not describe all possible interactions. Give your health care provider a list of all the medicines, herbs, non-prescription drugs, or dietary supplements you use. Also tell them if you smoke, drink alcohol, or use illegal drugs. Some items may interact with your medicine. What should I watch for while using this medicine? Visit your doctor or health care professional for regular checks on your progress. You will need frequent blood checks during the first few months you are receiving the medicine. If you get a cold or other infection while receiving this medicine, call your doctor or health care professional. Do not treat yourself. The medicine may increase your risk of getting an infection. Women should inform their doctor if they wish to become pregnant or think they might be pregnant. There is a potential for serious side effects to an unborn child. Talk to your health care professional or pharmacist for more information. Men may have a reduced sperm count while they are taking this medicine. Talk to your health care professional for more information. This medicine may increase your risk of getting certain kinds of cancer. Talk to your doctor about healthy lifestyle choices, important screenings, and your risk. What side effects may I notice from receiving this medicine? Side effects that you should report to your doctor or health care professional as soon as possible:  allergic reactions like skin rash, itching or hives, swelling of the face, lips, or tongue  changes in vision  confusion  fever, chills, or any other sign of infection  loss of balance or coordination  severe stomach pain  unusual bleeding, bruising  unusually weak or tired  vomiting  yellowing of the eyes or skin Side effects that usually do not require medical attention (report to your doctor or health  care professional if they continue or are bothersome):  hair loss  nausea This list may not describe all possible side effects. Call your doctor for medical advice about side effects. You may report side effects to FDA at 1-800-FDA-1088. Where should I keep my medicine? Keep out of the reach of children. Store at room temperature between 15 and 25 degrees C (59 and 77 degrees F). Protect from light. Throw away any unused medicine after the expiration date. NOTE: This sheet is a summary. It may not cover all possible information. If you have questions about this medicine, talk to your doctor, pharmacist, or health care provider.  2021 Elsevier/Gold Standard (2013-04-18 12:00:31)    Please take Imuran 25 mg, 1 tablet p.o. daily for 2 weeks.  If labs are normal after 2 weeks then increase Imuran to 50 mg p.o. daily.  Repeat labs in 2 weeks, and then every 2 months  Standing Labs We placed an order today for your standing lab work.   Please have your standing labs drawn in 2 weeks x 2 then every 2 months  If possible, please have your labs drawn 2 weeks prior to your appointment so that the provider can discuss your results at your appointment.  We have open lab daily Monday through Thursday from 1:30-4:30 PM and Friday from 1:30-4:00 PM at the office of Dr. Pollyann Savoy, [facility].   Please be advised, all patients with office appointments requiring lab work will take precedents over walk-in lab work.  If possible, please come for your lab work on Monday and Friday afternoons, as you may experience shorter wait times. The office is located at [address], [place], [place] [address] No appointment is necessary.   Labs are drawn by [facility]. Please bring your co-pay at the time of your lab draw.  You may receive a bill from [facility] for your lab work.  If you wish to have your labs drawn at another location, please call the office 24 hours in advance to send  orders.  If you have any questions regarding directions or hours of operation,  please call [phone].   As a reminder, please drink plenty of water prior to coming for your lab work. Thanks!

## 2020-05-02 ENCOUNTER — Other Ambulatory Visit: Payer: Self-pay | Admitting: *Deleted

## 2020-05-18 ENCOUNTER — Encounter (HOSPITAL_COMMUNITY): Payer: Self-pay | Admitting: *Deleted

## 2020-05-18 ENCOUNTER — Ambulatory Visit (HOSPITAL_COMMUNITY)
Admission: EM | Admit: 2020-05-18 | Discharge: 2020-05-18 | Disposition: A | Discharge status: Home / Self Care | Payer: Medicare HMO

## 2020-05-18 ENCOUNTER — Emergency Department (HOSPITAL_BASED_OUTPATIENT_CLINIC_OR_DEPARTMENT_OTHER)
Admission: EM | Admit: 2020-05-18 | Discharge: 2020-05-18 | Disposition: A | Discharge status: Home / Self Care | Payer: Medicare HMO | Attending: Emergency Medicine | Admitting: Emergency Medicine

## 2020-05-18 ENCOUNTER — Other Ambulatory Visit: Payer: Self-pay

## 2020-05-18 ENCOUNTER — Emergency Department (HOSPITAL_BASED_OUTPATIENT_CLINIC_OR_DEPARTMENT_OTHER): Payer: Medicare HMO | Admitting: Radiology

## 2020-05-18 ENCOUNTER — Encounter (HOSPITAL_BASED_OUTPATIENT_CLINIC_OR_DEPARTMENT_OTHER): Payer: Self-pay | Admitting: *Deleted

## 2020-05-18 DIAGNOSIS — I10 Essential (primary) hypertension: Secondary | ICD-10-CM | POA: Insufficient documentation

## 2020-05-18 DIAGNOSIS — M79661 Pain in right lower leg: Secondary | ICD-10-CM | POA: Diagnosis not present

## 2020-05-18 DIAGNOSIS — M25561 Pain in right knee: Secondary | ICD-10-CM | POA: Diagnosis not present

## 2020-05-18 DIAGNOSIS — Z79899 Other long term (current) drug therapy: Secondary | ICD-10-CM | POA: Insufficient documentation

## 2020-05-18 DIAGNOSIS — Z96642 Presence of left artificial hip joint: Secondary | ICD-10-CM | POA: Diagnosis not present

## 2020-05-18 DIAGNOSIS — M1711 Unilateral primary osteoarthritis, right knee: Secondary | ICD-10-CM | POA: Diagnosis not present

## 2020-05-18 MED ORDER — ACETAMINOPHEN 325 MG PO TABS
650.0000 mg | ORAL_TABLET | Freq: Once | ORAL | Status: AC
  Administered 2020-05-18: 650 mg via ORAL
  Filled 2020-05-18: qty 2

## 2020-05-18 MED ORDER — TRAMADOL HCL 50 MG PO TABS: 50.0000 mg | ORAL_TABLET | Freq: Four times a day (QID) | ORAL | 0 refills | Status: DC | PRN

## 2020-05-18 MED ORDER — IBUPROFEN 400 MG PO TABS
400.0000 mg | ORAL_TABLET | Freq: Once | ORAL | Status: AC
  Administered 2020-05-18: 400 mg via ORAL
  Filled 2020-05-18: qty 1

## 2020-05-18 NOTE — ED Provider Notes (Signed)
[facility] EMERGENCY DEPT Provider Note   CSN: [id number] Arrival date & time: 05/18/20  1832     History Chief Complaint  Patient presents with  . Knee Pain    Elaine Allen is a 65 y.o. female.  Patient c/o right knee pain. Hx same. Was at family members graduation, was having pain in knee and had knee 'give way' - did not fall to ground, was able to grab on/get support from others around here, and use her cane. Has had other knee previously replaced. Pain constant, dull, moderate, non radiating. No leg swelling. No wounds or skin lesions. No fever/chills. No leg numbness/weakness. Denies hip or ankle pain. Patient drove self to ED.   The history is provided by the patient.  Knee Pain Associated symptoms: no back pain and no fever        Past Medical History:  Diagnosis Date  . Arthritis   . Blind right eye    sees colors  . Chronic anemia   . Detached retina, right    corrected good for about 6 months sees peripheral colors only  . History of kidney stones   . Hypertension   . Hyperthyroidism   . Lupus (HCC)   . Pneumonia   . Retinal detachment   . Spasmodic dysphonia   . Stroke (HCC) 2015   no permanant defecits had weakness in left arm in hospital Had PT also  . Vertigo    Hx of preceding stroke    Patient Active Problem List   Diagnosis Date Noted  . Routine general medical examination at a health care facility 03/15/2020  . Immune thrombocytopenia (HCC) 03/26/2019  . AVN of femur (HCC) 06/23/2018  . Greater trochanteric bursitis of left hip 04/08/2018  . Left knee pain 04/06/2018  . Numbness in feet 09/14/2017  . Elevated LFTs 02/24/2017  . Pre-diabetes 02/19/2017  . Fatigue 02/19/2017  . OA (osteoarthritis) of hip 10/29/2016  . Numbness and tingling in left arm 05/05/2016  . Cough 12/25/2015  . Wheezing 12/25/2015  . Abdominal pain 12/25/2015  . Back pain 10/01/2015  . Insomnia 03/01/2015  . Family hx of colon cancer  11/12/2014  . Essential hypertension, benign 05/15/2013  . Other and unspecified hyperlipidemia 05/15/2013  . Vertebral artery dissection (HCC) 05/14/2013  . Hx of completed stroke 05/14/2013  . Chronic anemia   . Primary osteoarthritis of right knee 12/19/2007  . Primary hyperparathyroidism (HCC) 11/24/2007  . Osteopenia 11/24/2007  . Lupus (systemic lupus erythematosus) (HCC) 01/05/2002    Past Surgical History:  Procedure Laterality Date  . ABDOMINAL HYSTERECTOMY    . BREAST LUMPECTOMY     left breast benign  . KNEE SURGERY Left   . THYROID SURGERY    . TOTAL HIP ARTHROPLASTY Left 06/27/2018   Procedure: TOTAL HIP ARTHROPLASTY ANTERIOR APPROACH;  Surgeon: Gean Birchwood, MD;  Location: [facility];  Service: Orthopedics;  Laterality: Left;     OB History   No obstetric history on file.     Family History  Problem Relation Age of Onset  . Cancer Mother        liver  . Diabetes Mother   . Arthritis Mother   . Cancer Father        prostate,METS, stomach   . Ovarian cancer Sister   . Throat cancer Brother     Social History   Tobacco Use  . Smoking status: Never Smoker  . Smokeless tobacco: Never Used  Vaping Use  . Vaping  Use: Never used  Substance Use Topics  . Alcohol use: No  . Drug use: No    Home Medications Prior to Admission medications   Medication Sig Start Date End Date Taking? Authorizing Provider  azaTHIOprine (IMURAN) 50 MG tablet Take 1/2 tablet (25mg ) by mouth daily for 2 weeks, if labs are stable increase to 1 tablet (50mg ) by mouth daily. 05/01/20   , MD  Cholecalciferol (VITAMIN D3 PO) Take 1 tablet by mouth daily.    [provider]  losartan-hydrochlorothiazide (HYZAAR) 100-25 MG tablet TAKE 1 TABLET BY MOUTH DAILY 06/28/19   Pollyann Savoy, MD  Probiotic Product (PROBIOTIC PO) Take by mouth.    [provider]  vitamin B-12 (CYANOCOBALAMIN) 1000 MCG tablet Take 1,000 mcg by mouth daily.    [provider]  Vitamin D, Ergocalciferol, (DRISDOL) 1.25 MG (50000 UNIT) CAPS capsule Take 1 capsule (50,000 Units total) by mouth every 7 (seven) days. 05/01/20   Myrlene Broker, MD    Allergies    Plaquenil [hydroxychloroquine sulfate], Azithromycin, Lactose intolerance (gi), and Penicillin g  Review of Systems   Review of Systems  Constitutional: Negative for fever.  Respiratory: Negative for shortness of breath.   Cardiovascular: Negative for chest pain and leg swelling.  Musculoskeletal: Negative for back pain.       Right knee pain  Skin: Negative for wound.  Neurological: Negative for weakness and numbness.    Physical Exam Updated Vital Signs BP 123/72 (BP Location: Left Arm)   Pulse 90   Temp 98.8 F (37.1 C) (Oral)   Resp 17   SpO2 100%   Physical Exam Vitals and nursing note reviewed.  Constitutional:      Appearance: Normal appearance. She is well-developed.  HENT:     Head: Atraumatic.     Nose: Nose normal.     Mouth/Throat:     Mouth: Mucous membranes are moist.  Eyes:     General: No scleral icterus.    Conjunctiva/sclera: Conjunctivae normal.  Neck:     Trachea: No tracheal deviation.  Cardiovascular:     Rate and Rhythm: Normal rate.     Pulses: Normal pulses.  Pulmonary:     Effort: Pulmonary effort is normal. No respiratory distress.  Genitourinary:    Comments: No cva tenderness.  Musculoskeletal:        General: No swelling.     Cervical back: Neck supple. No muscular tenderness.     Comments: Tenderness right knee. Mild crepitus w rom. No effusion. No increased warmth or erythema. No pain w passive rom. Knee grossly stable on exam. Distal pulses palp. No leg or lower leg swelling noted. No calf  Tenderness. No pain w rom at hip or ankle.   Skin:    General: Skin is warm and dry.     Findings: No rash.  Neurological:     Mental Status: She is alert.     Comments: Alert, speech normal. RLE motor/sens grossly intact. Steady gait.    Psychiatric:        Mood and Affect: Mood normal.     ED Results / Procedures / Treatments   Labs (all labs ordered are listed, but only abnormal results are displayed) Labs Reviewed - No data to display  EKG None  Radiology DG Knee Complete 4 Views Right  Result Date: 05/18/2020 CLINICAL DATA:  Right knee pain. EXAM: RIGHT KNEE - COMPLETE 4+ VIEW COMPARISON:  None. FINDINGS: No fracture or dislocation. Tricompartmental osteoarthritis  with peripheral spurring. There is mild medial tibiofemoral joint space narrowing. Spurring of the tibial spines. Clustered osseous densities projecting over the medial suprapatellar space may represent intra-articular bodies. No significant joint effusion. IMPRESSION: 1. Tricompartmental osteoarthritis with peripheral spurring and mild medial tibiofemoral joint space narrowing. Possible intra-articular bodies in the medial suprapatellar space. 2. No fracture. Electronically Signed   By: Narda Rutherford M.D.   On: 05/18/2020 20:38    Procedures Procedures   Medications Ordered in ED Medications - No data to display  ED Course  I have reviewed the triage vital signs and the nursing notes.  Pertinent labs & imaging results that were available during my care of the patient were reviewed by me and considered in my medical decision making (see chart for details).    MDM Rules/Calculators/A&P                         Xrays.  Ibuprofen po. Acetaminophen po. (pt drove self to ED).  Will provide knee support/brace for home use.   Rec ortho f/u.  Xrays reviewed/intepreted by me - significant degenerative changes. No fx.    Final Clinical Impression(s) / ED Diagnoses Final diagnoses:  None    Rx / DC Orders ED Discharge Orders    None       Cathren Laine, MD 05/18/20 2203

## 2020-05-18 NOTE — Discharge Instructions (Addendum)
Please go to the emergency room for further workup.

## 2020-05-18 NOTE — ED Provider Notes (Signed)
[facility]    CSN: [id number] Arrival date & time: 05/18/20  1745      History   Chief Complaint Chief Complaint  Patient presents with  . Knee Pain    HPI Elaine Allen is a 65 y.o. female.   HPI   Knee Pain: Patient states that for the past 2 days she has had right knee pain. Worsened today and now severe. She has having some pain in the anterior side of her knee from swelling but also notes posterior pain and significant calf pain.  She is concerned that she may have a blood clot.  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  She states that it hurts to walk on the leg due to her calf and posterior knee pain, she denies any known injury and that her pain is not controlled with things such as ibuprofen or even prednisone.  There is no numbness or tingling of the leg and no skin color changes.  Potential edema of the lower leg.  No chest pain or shortness of breath.  Past Medical History:  Diagnosis Date  . Arthritis   . Blind right eye    sees colors  . Chronic anemia   . Detached retina, right    corrected good for about 6 months sees peripheral colors only  . History of kidney stones   . Hypertension   . Hyperthyroidism   . Lupus (HCC)   . Pneumonia   . Retinal detachment   . Spasmodic dysphonia   . Stroke (HCC) 2015   no permanant defecits had weakness in left arm in hospital Had PT also  . Vertigo    Hx of preceding stroke    Patient Active Problem List   Diagnosis Date Noted  . Routine general medical examination at a health care facility 03/15/2020  . Immune thrombocytopenia (HCC) 03/26/2019  . AVN of femur (HCC) 06/23/2018  . Greater trochanteric bursitis of left hip 04/08/2018  . Left knee pain 04/06/2018  . Numbness in feet 09/14/2017  . Elevated LFTs 02/24/2017  . Pre-diabetes 02/19/2017  . Fatigue 02/19/2017  . OA (osteoarthritis) of hip 10/29/2016   . Numbness and tingling in left arm 05/05/2016  . Cough 12/25/2015  . Wheezing 12/25/2015  . Abdominal pain 12/25/2015  . Back pain 10/01/2015  . Insomnia 03/01/2015  . Family hx of colon cancer 11/12/2014  . Essential hypertension, benign 05/15/2013  . Other and unspecified hyperlipidemia 05/15/2013  . Vertebral artery dissection (HCC) 05/14/2013  . Hx of completed stroke 05/14/2013  . Chronic anemia   . Primary osteoarthritis of right knee 12/19/2007  . Primary hyperparathyroidism (HCC) 11/24/2007  . Osteopenia 11/24/2007  . Lupus (systemic lupus erythematosus) (HCC) 01/05/2002    Past Surgical History:  Procedure Laterality Date  . ABDOMINAL HYSTERECTOMY    . BREAST LUMPECTOMY     left breast benign  . KNEE SURGERY Left   . THYROID SURGERY    . TOTAL HIP ARTHROPLASTY Left 06/27/2018   Procedure: TOTAL HIP ARTHROPLASTY ANTERIOR APPROACH;  Surgeon: Gean Birchwood, MD;  Location: [facility];  Service: Orthopedics;  Laterality: Left;    OB History   No obstetric history on file.      Home Medications    Prior to Admission medications   Medication Sig Start Date End Date Taking? Authorizing Provider  azaTHIOprine (IMURAN) 50 MG tablet Take 1/2  tablet (25mg ) by mouth daily for 2 weeks, if labs are stable increase to 1 tablet (50mg ) by mouth daily. 05/01/20   , MD  Cholecalciferol (VITAMIN D3 PO) Take 1 tablet by mouth daily.    [provider]  losartan-hydrochlorothiazide (HYZAAR) 100-25 MG tablet TAKE 1 TABLET BY MOUTH DAILY 06/28/19   Pollyann Savoy, MD  Probiotic Product (PROBIOTIC PO) Take by mouth.    [provider]  vitamin B-12 (CYANOCOBALAMIN) 1000 MCG tablet Take 1,000 mcg by mouth daily.    [provider]  Vitamin D, Ergocalciferol, (DRISDOL) 1.25 MG (50000 UNIT) CAPS capsule Take 1 capsule (50,000 Units total) by mouth every 7 (seven) days. 05/01/20   Myrlene Broker, MD    Family History Family History  Problem  Relation Age of Onset  . Cancer Mother        liver  . Diabetes Mother   . Arthritis Mother   . Cancer Father        prostate,METS, stomach   . Ovarian cancer Sister   . Throat cancer Brother     Social History Social History   Tobacco Use  . Smoking status: Never Smoker  . Smokeless tobacco: Never Used  Vaping Use  . Vaping Use: Never used  Substance Use Topics  . Alcohol use: No  . Drug use: No     Allergies   Plaquenil [hydroxychloroquine sulfate], Azithromycin, Lactose intolerance (gi), and Penicillin g   Review of Systems Review of Systems  As stated above in HPI Physical Exam Triage Vital Signs ED Triage Vitals  Enc Vitals Group     BP 05/18/20 1755 110/78     Pulse Rate 05/18/20 1755 (!) 104     Resp 05/18/20 1755 20     Temp 05/18/20 1755 99.3 F (37.4 C)     Temp src --      SpO2 05/18/20 1755 95 %     Weight --      Height --      Head Circumference --      Peak Flow --      Pain Score 05/18/20 1753 10     Pain Loc --      Pain Edu? --      Excl. in GC? --    No data found.  Updated Vital Signs BP 110/78   Pulse (!) 104   Temp 99.3 F (37.4 C)   Resp 20   SpO2 95%   Physical Exam Vitals and nursing note reviewed.  Constitutional:      Appearance: Normal appearance.  Cardiovascular:     Pulses: Normal pulses.  Musculoskeletal:        General: Tenderness (right posterior knee and calf) present. No signs of injury.     Right lower leg: No edema.     Left lower leg: No edema.     Comments: ROM of the right knee is limited due to pain. Positive Homans sign of the right  Skin:    General: Skin is warm.     Capillary Refill: Capillary refill takes less than 2 seconds.     Coloration: Skin is not jaundiced or pale.     Findings: No bruising, erythema or rash.  Neurological:     General: No focal deficit present.     Mental Status: She is alert and oriented to person, place, and time.     Sensory: No sensory deficit.     Motor: No  weakness.  Coordination: Coordination normal.     Gait: Gait abnormal.      UC Treatments / Results  Labs (all labs ordered are listed, but only abnormal results are displayed) Labs Reviewed - No data to display  EKG   Radiology No results found.  Procedures Procedures (including critical care time)  Medications Ordered in UC Medications - No data to display  Initial Impression / Assessment and Plan / UC Course  I have reviewed the triage vital signs and the nursing notes.  Pertinent labs & imaging results that were available during my care of the patient were reviewed by me and considered in my medical decision making (see chart for details).     New.  Given her calf and posterior knee pain I am concerned about a potential DVT which I discussed with patient.  During the week I have access to scheduling her an appointment for an outpatient ultrasound however on the weekends I do not have access to this.  I have recommended that she proceed to the emergency room for further work-up which she is agreeable with.  She elects private vehicle transfer. Final Clinical Impressions(s) / UC Diagnoses   Final diagnoses:  None   Discharge Instructions   None    ED Prescriptions    None     PDMP not reviewed this encounter.   Rushie Chestnut, [place] 05/18/20 1811

## 2020-05-18 NOTE — ED Notes (Signed)
Patient is being discharged from the Urgent Care and sent to the Emergency Department via pov . Per Lucile Shutters, PA, patient is in need of higher level of care due to knee pain , DVT workup. Patient is aware and verbalizes understanding of plan of care.  Vitals:   05/18/20 1755  BP: 110/78  Pulse: (!) 104  Resp: 20  Temp: 99.3 F (37.4 C)  SpO2: 95%

## 2020-05-18 NOTE — Discharge Instructions (Addendum)
It was our pleasure to provide your ER care today - we hope that you feel better.  Your xrays were read as showing significant degenerative/arthritis changes in the knee with possible loose bodies.   Wear knee brace as need for comfort/support.  Fall precautions.   Take ibuprofen or acetaminophen as need for pain. You may also take ultram as need for pain - no driving when taking.   Follow up with orthopedist in the next 1-2 weeks - call office Monday to arrange appointment.   Return to ER if worse, new symptoms, fevers, intractable pain, numbness/weakness, or other concern.

## 2020-05-18 NOTE — ED Triage Notes (Signed)
Pt reports her Rt knee gave out on hertoday . Pt did not fall.

## 2020-05-18 NOTE — ED Triage Notes (Addendum)
Patient states that she was at her daughters graduation when her right knee gave out on her. She is concerned because the back of her right knee aches as well as the back of her calf. Denies any injury or recent long travel.

## 2020-05-20 ENCOUNTER — Other Ambulatory Visit: Payer: Self-pay | Admitting: *Deleted

## 2020-05-20 DIAGNOSIS — D693 Immune thrombocytopenic purpura: Secondary | ICD-10-CM | POA: Diagnosis not present

## 2020-05-20 DIAGNOSIS — M329 Systemic lupus erythematosus, unspecified: Secondary | ICD-10-CM

## 2020-05-20 DIAGNOSIS — R760 Raised antibody titer: Secondary | ICD-10-CM

## 2020-05-20 DIAGNOSIS — Z79899 Other long term (current) drug therapy: Secondary | ICD-10-CM

## 2020-05-21 ENCOUNTER — Other Ambulatory Visit: Payer: Self-pay | Admitting: *Deleted

## 2020-05-21 DIAGNOSIS — Z79899 Other long term (current) drug therapy: Secondary | ICD-10-CM

## 2020-05-21 DIAGNOSIS — R760 Raised antibody titer: Secondary | ICD-10-CM

## 2020-05-21 DIAGNOSIS — R768 Other specified abnormal immunological findings in serum: Secondary | ICD-10-CM

## 2020-05-21 DIAGNOSIS — M329 Systemic lupus erythematosus, unspecified: Secondary | ICD-10-CM

## 2020-05-21 DIAGNOSIS — R7689 Other specified abnormal immunological findings in serum: Secondary | ICD-10-CM

## 2020-05-21 DIAGNOSIS — E8889 Other specified metabolic disorders: Secondary | ICD-10-CM

## 2020-05-21 DIAGNOSIS — D693 Immune thrombocytopenic purpura: Secondary | ICD-10-CM

## 2020-05-21 DIAGNOSIS — R7989 Other specified abnormal findings of blood chemistry: Secondary | ICD-10-CM

## 2020-05-21 LAB — COMPLETE METABOLIC PANEL WITH GFR
AG Ratio: 1.3 (calc) (ref 1.0–2.5)
ALT: 13 U/L (ref 6–29)
AST: 23 U/L (ref 10–35)
Albumin: 4.3 g/dL (ref 3.6–5.1)
Alkaline phosphatase (APISO): 94 U/L (ref 37–153)
BUN: 22 mg/dL (ref 7–25)
CO2: 25 mmol/L (ref 20–32)
Calcium: 9.7 mg/dL (ref 8.6–10.4)
Chloride: 101 mmol/L (ref 98–110)
Creat: 0.9 mg/dL (ref 0.50–0.99)
GFR, Est African American: 77 mL/min/{1.73_m2} (ref >=60)
GFR, Est Non African American: 67 mL/min/{1.73_m2} (ref >=60)
Globulin: 3.4 g/dL (calc) (ref 1.9–3.7)
Glucose, Bld: 88 mg/dL (ref 65–139)
Potassium: 3.7 mmol/L (ref 3.5–5.3)
Sodium: 138 mmol/L (ref 135–146)
Total Bilirubin: 0.6 mg/dL (ref 0.2–1.2)
Total Protein: 7.7 g/dL (ref 6.1–8.1)

## 2020-05-21 LAB — CBC WITH DIFFERENTIAL/PLATELET
Absolute Monocytes: 335 cells/uL (ref 200–950)
Basophils Absolute: 10 cells/uL (ref 0–200)
Basophils Relative: 0.4 %
Eosinophils Absolute: 90 cells/uL (ref 15–500)
Eosinophils Relative: 3.6 %
HCT: 38.2 % (ref 35.0–45.0)
Hemoglobin: 12.7 g/dL (ref 11.7–15.5)
Lymphs Abs: 1048 cells/uL (ref 850–3900)
MCH: 29.8 pg (ref 27.0–33.0)
MCHC: 33.2 g/dL (ref 32.0–36.0)
MCV: 89.7 fL (ref 80.0–100.0)
MPV: 12.5 fL (ref 7.5–12.5)
Monocytes Relative: 13.4 %
Neutro Abs: 1018 cells/uL — ABNORMAL LOW (ref 1500–7800)
Neutrophils Relative %: 40.7 %
Platelets: 150 10*3/uL (ref 140–400)
RBC: 4.26 10*6/uL (ref 3.80–5.10)
RDW: 13.2 % (ref 11.0–15.0)
Total Lymphocyte: 41.9 %
WBC: 2.5 10*3/uL — ABNORMAL LOW (ref 3.8–10.8)

## 2020-05-21 NOTE — Progress Notes (Signed)
CMP is normal.  Neutropenia noted most likely due to Imuran use.  Patient should have repeat labs in 2 weeks which should include CBC with differential, vitamin D, double-stranded DNA, complements, UA.

## 2020-05-30 NOTE — Progress Notes (Signed)
Office Visit Note  Patient: Elaine Allen             Date of Birth: 01/07/1955           MRN: [id number]             PCP: Myrlene Broker, MD Referring: Myrlene Broker, * Visit Date: 06/12/2020 Occupation: @  Subjective:  Medication monitoring  History of Present Illness: Scott Fix is a 65 y.o. female with history of systemic lupus erythematosus.  She is currently taking imuran 50 mg 1 tablet by mouth daily.  She is unable to increase the dose of imuran due to being an intermediate TPMT metabolizer.  She has been tolerating imuran without any side effects.  She has noticed a significant improvement in all of her symptoms since starting on imuran.  Her joint pain and joint swelling has resolved.  She is no longer having muscle tenderness or muscle aches.  Her energy level has also improved.  It has been easier for her to get out of bed in the mornings due to less stiffness and less fatigue. She has had less difficulty performing responsibilities at work and at home. She does not want to have to discontinue imuran since it has been more effective than rituxan at managing her symptoms.   She has noticed some increased bruising.  She is not taking aspirin or any other blood thinners at this time.  She has occasional rashes and ongoing photosensitivity.  She tries to avoid direct sun exposure.  She denies any oral or nasal ulcers at this time. She has chronic eye dryness but no mouth dryness currently.  She denies any symptoms of raynaud's.  She denies any shortness of breath, pleuritic chest pain, or palpations.    Activities of Daily Living:  Patient reports morning stiffness for 1-2  hours.   Patient Denies nocturnal pain.  Difficulty dressing/grooming: Denies Difficulty climbing stairs: Denies Difficulty getting out of chair: Denies Difficulty using hands for taps, buttons, cutlery, and/or writing: Denies  Review of Systems  Constitutional: Positive  for fatigue.  HENT: Negative for mouth sores, mouth dryness and nose dryness.   Eyes: Positive for itching and dryness. Negative for pain.  Respiratory: Negative for shortness of breath and difficulty breathing.   Cardiovascular: Negative for chest pain and palpitations.  Gastrointestinal: Negative for blood in stool, constipation and diarrhea.  Endocrine: Negative for increased urination.  Genitourinary: Negative for difficulty urinating.  Musculoskeletal: Positive for arthralgias, joint pain, myalgias, morning stiffness, muscle tenderness and myalgias. Negative for joint swelling.  Skin: Positive for rash. Negative for color change.  Allergic/Immunologic: Negative for susceptible to infections.  Neurological: Positive for weakness. Negative for dizziness, numbness, headaches and memory loss.  Hematological: Positive for bruising/bleeding tendency.  Psychiatric/Behavioral: Negative for confusion.    PMFS History:  Patient Active Problem List   Diagnosis Date Noted  . Routine general medical examination at a health care facility 03/15/2020  . Immune thrombocytopenia (HCC) 03/26/2019  . AVN of femur (HCC) 06/23/2018  . Greater trochanteric bursitis of left hip 04/08/2018  . Left knee pain 04/06/2018  . Numbness in feet 09/14/2017  . Elevated LFTs 02/24/2017  . Pre-diabetes 02/19/2017  . Fatigue 02/19/2017  . OA (osteoarthritis) of hip 10/29/2016  . Numbness and tingling in left arm 05/05/2016  . Cough 12/25/2015  . Wheezing 12/25/2015  . Abdominal pain 12/25/2015  . Back pain 10/01/2015  . Insomnia 03/01/2015  . Family hx of colon cancer  11/12/2014  . Essential hypertension, benign 05/15/2013  . Other and unspecified hyperlipidemia 05/15/2013  . Vertebral artery dissection (HCC) 05/14/2013  . Hx of completed stroke 05/14/2013  . Chronic anemia   . Primary osteoarthritis of right knee 12/19/2007  . Primary hyperparathyroidism (HCC) 11/24/2007  . Osteopenia 11/24/2007  .  Lupus (systemic lupus erythematosus) (HCC) 01/05/2002    Past Medical History:  Diagnosis Date  . Arthritis   . Blind right eye    sees colors  . Chronic anemia   . Detached retina, right    corrected good for about 6 months sees peripheral colors only  . History of kidney stones   . Hypertension   . Hyperthyroidism   . Lupus (HCC)   . Pneumonia   . Retinal detachment   . Spasmodic dysphonia   . Stroke (HCC) 2015   no permanant defecits had weakness in left arm in hospital Had PT also  . Vertigo    Hx of preceding stroke    Family History  Problem Relation Age of Onset  . Cancer Mother        liver  . Diabetes Mother   . Arthritis Mother   . Cancer Father        prostate,METS, stomach   . Ovarian cancer Sister   . Throat cancer Brother    Past Surgical History:  Procedure Laterality Date  . ABDOMINAL HYSTERECTOMY    . BREAST LUMPECTOMY     left breast benign  . KNEE SURGERY Left   . THYROID SURGERY    . TOTAL HIP ARTHROPLASTY Left 06/27/2018   Procedure: TOTAL HIP ARTHROPLASTY ANTERIOR APPROACH;  Surgeon: Gean Birchwoodowan, Frank, MD;  Location: [facility];  Service: Orthopedics;  Laterality: Left;   Social History   Social History Narrative  . Not on file   Immunization History  Administered Date(s) Administered  . Fluad Quad(high Dose 65+) 10/21/2018  . Hep A / Hep B 03/08/2017  . Influenza Whole 12/19/2007  . Influenza,inj,Quad PF,6+ Mos 11/12/2014, 10/01/2015, 10/28/2016, 02/09/2018  . Influenza-Unspecified 11/15/2013  . PFIZER(Purple Top)SARS-COV-2 Vaccination 06/06/2019, 07/06/2019  . PPD Test 09/12/2013, 01/09/2015, 03/18/2016, 03/23/2016, 08/10/2017  . Pneumococcal Conjugate-13 10/21/2018  . Pneumococcal Polysaccharide-23 03/14/2020  . Tdap 12/05/2014  . Zoster, Live 01/02/2015     Objective: Vital Signs: BP 114/74 (BP Location: Left Arm, Patient Position: Sitting, Cuff Size: Normal)   Pulse 97   Resp 14   Ht 5\' 3"  (1.6 m)   Wt 193 lb 12.8 oz (87.9 kg)    BMI 34.33 kg/m    Physical Exam Vitals and nursing note reviewed.  Constitutional:      Appearance: She is well-developed.  HENT:     Head: Normocephalic and atraumatic.  Eyes:     Conjunctiva/sclera: Conjunctivae normal.  Pulmonary:     Effort: Pulmonary effort is normal.  Abdominal:     Palpations: Abdomen is soft.  Musculoskeletal:     Cervical back: Normal range of motion.  Skin:    General: Skin is warm and dry.     Capillary Refill: Capillary refill takes less than 2 seconds.  Neurological:     Mental Status: She is alert and oriented to person, place, and time.  Psychiatric:        Behavior: Behavior normal.      Musculoskeletal Exam:  C-spine good ROM with no discomfort.  Shoulder joints, elbow joints, wrist joints, MCPs, PIPs, and DIPs good ROM with no synovitis.  Complete fist formation bilaterally.  Left  hip replacement has good ROM with no discomfort. Right hip joint has good ROM with no discomfort.  Left knee replacement has good ROM.  Right knee joint has good ROM with warmth or effusion.  Ankle joints good ROM with no tenderness or joint swelling.  No tenderness over MTP joints.  Right sided hemiparesis due to previous stroke.   CDAI Exam: CDAI Score: -- Patient Global: --; Provider Global: -- Swollen: --; Tender: -- Joint Exam 06/12/2020   No joint exam has been documented for this visit   There is currently no information documented on the homunculus. Go to the Rheumatology activity and complete the homunculus joint exam.  Investigation: No additional findings.  Imaging: DG Knee Complete 4 Views Right  Result Date: 05/18/2020 CLINICAL DATA:  Right knee pain. EXAM: RIGHT KNEE - COMPLETE 4+ VIEW COMPARISON:  None. FINDINGS: No fracture or dislocation. Tricompartmental osteoarthritis with peripheral spurring. There is mild medial tibiofemoral joint space narrowing. Spurring of the tibial spines. Clustered osseous densities projecting over the medial  suprapatellar space may represent intra-articular bodies. No significant joint effusion. IMPRESSION: 1. Tricompartmental osteoarthritis with peripheral spurring and mild medial tibiofemoral joint space narrowing. Possible intra-articular bodies in the medial suprapatellar space. 2. No fracture. Electronically Signed   By: Narda Rutherford M.D.   On: 05/18/2020 20:38    Recent Labs: Lab Results  Component Value Date   WBC 2.3 (L) 06/04/2020   HGB 12.2 06/04/2020   PLT 88 (L) 06/04/2020   NA 140 06/04/2020   K 3.5 06/04/2020   CL 103 06/04/2020   CO2 28 06/04/2020   GLUCOSE 98 06/04/2020   BUN 15 06/04/2020   CREATININE 0.94 06/04/2020   BILITOT 0.6 06/04/2020   ALKPHOS 100 03/22/2020   AST 20 06/04/2020   ALT 10 06/04/2020   PROT 7.3 06/04/2020   ALBUMIN 3.7 03/22/2020   CALCIUM 9.0 06/04/2020   GFRAA 73 06/04/2020   QFTBGOLDPLUS NEGATIVE 01/31/2020    Speciality Comments: PLQ-blisters all over the body  Procedures:  No procedures performed Allergies: Plaquenil [hydroxychloroquine sulfate], Azithromycin, Lactose intolerance (gi), and Penicillin g   Assessment / Plan:     Visit Diagnoses: SLE (systemic lupus erythematosus related syndrome) (HCC) - [facility] in [place]. History of rash, hives, fatigue, nasal ulcers, sicca symptoms, hair loss, Raynauds, arthralgias. +ANA,+ Ro , elevated sed rate: She has noticed significant improvement in her symptoms since starting on Imuran after her last office visit on 05/01/20.  She has been tolerating Imuran without any side effects.  She has found imuran to be more effective than rituxan was at managing her symptoms.  Her arthralgias, myalgias, joint stiffness, and joint swelling has improved by about 80% since starting imuran.  She has no synovitis on examination today.  She has not had any other signs or symptoms of a flare recently.  She has occasional scattered rashes and ongoing photosensitivity, so I discussed the importance of avoiding direct  sun exposure and to wear sunscreen SPF>50 on a daily basis.  She has not had any symptoms of raynaud's and no digital ulcerations or signs of gangrene were noted.  No oral or nasal ulcers. She has not had any shortness of breath, pleuritic chest pain, or palpitations.  Her energy level has improved while taking Imuran. She has been able to perform her responsibilities at work and home with less difficulty.  Lab work from 06/04/20 was reviewed with the patient today: dsDNA negative, complements WNL, platelet count 88, WBC count 2.3-absolute neutrophils  and lymphocytes are low.  Vitamin D was 85.  Discussed the patients clinical improvement on Imuran as well as her recent lab work with Dr. Corliss Skains today in the office.  She has reached out to Dr. Candise Che for further recommendations regarding the patients thrombocytopenia. Discussed that she may need to switch to cellcept in the future if her thrombocytopenia worsens.  Indications, contraindications, and potential side effects of Cellcept were discussed today in detail.  Consent was obtained in case she needs to start on it in the future. The patient does not want to make any medication changes at this time. She will remain on Imuran 50 mg 1 tablet by mouth daily.  She will require close lab monitoring on a monthly basis.  Future orders for autoimmune lab work is in place. She was advised to notify us if she develops any new or worsening symptoms.  She will follow up in 2 months.   Immune thrombocytopenia (HCC) - Previously received Rituxan infusions prescribed by Dr. Candise Che for thrombocytopenia.  Her thrombocytopenia recovered on Rituxan.  She did not find Rituxan to be effective at managing her other symptoms secondary to SLE.  She was started on Imuran 50 mg 1 tablet by mouth daily after her last office visit on 05/01/2020.  Her platelet count was 88 on 06/04/2020. Dr. Corliss Skains has reached out to Dr. Candise Che for further recommendations.  The patient does not want to  make any medication changes at this time.  High risk medication use -Imuran 50 mg 1 tablet by mouth daily.  She remains on low-dose Imuran due to history of thrombocytopenia and leukopenia. TPMT intermediate metabolizer.  Previously on Rituxan by Dr. Jeral Pinch April 2021.  Lupus anticoagulant positive - Cardiolipin negative, beta-2 GP 1 -.  She is not on any form of anticoagulation due to longstanding history of thrombocytopenia.  TPMT intermediate metabolizer (HCC): TPMT 12. She will remain on low dose imuran 50 mg 1 tablet daily.   Elevated LFTs: LFTs WNL on 06/04/20.  We will continue to monitor lab work closely.   Chronic pain of both shoulders - Dr. Turner Daniels.  She has good ROM of both shoulder joints with no discomfort.    Primary osteoarthritis of both hands: Previous x-rays were consistent with osteoarthritis.  She has no joint tenderness or synovitis on exam.  Complete fist formation bilaterally.   Status post total hip replacement, left - Dr. Turner Daniels 06/27/18.  Doing well.  He has good range of motion with no discomfort at this time.  Primary osteoarthritis of right knee: She was evaluated in the ED on 05/18/2020 for right knee joint pain.  X-rays were obtained at that time.  She was given a knee brace.  She has good ROM of the right knee joint with no discomfort at this time.  No warmth or effusion noted.  Discussed the importance of avoiding NSAID use.  Primary osteoarthritis of both feet: She is not experiencing any discomfort in her feet at this time.   DDD (degenerative disc disease), lumbar: She has been experiencing increased lower back pain which has been radiating down the side of her left leg.  She is scheduled for a MRI of the lumbar spine.   Other medical conditions are listed as follows:   Hx of completed stroke: Right sided hemiparesis.    Vertebral artery dissection (HCC)  Spasmodic dysphonia  Essential hypertension, benign  Pre-diabetes  Primary  hyperparathyroidism (HCC)  Vitamin D deficiency  Orders: No orders of the defined types  were placed in this encounter.  No orders of the defined types were placed in this encounter.     Follow-Up Instructions: Return in about 2 months (around 08/12/2020) for Systemic lupus erythematosus.   Gearldine Bienenstock, PA-C  Note - This record has been created using Dragon software.  Chart creation errors have been sought, but may not always  have been located. Such creation errors do not reflect on  the standard of medical care.

## 2020-06-02 ENCOUNTER — Other Ambulatory Visit: Payer: Self-pay | Admitting: Rheumatology

## 2020-06-04 ENCOUNTER — Other Ambulatory Visit: Payer: Self-pay | Admitting: *Deleted

## 2020-06-04 ENCOUNTER — Telehealth: Payer: Self-pay

## 2020-06-04 DIAGNOSIS — R7989 Other specified abnormal findings of blood chemistry: Secondary | ICD-10-CM

## 2020-06-04 DIAGNOSIS — R768 Other specified abnormal immunological findings in serum: Secondary | ICD-10-CM

## 2020-06-04 DIAGNOSIS — R760 Raised antibody titer: Secondary | ICD-10-CM | POA: Diagnosis not present

## 2020-06-04 DIAGNOSIS — M329 Systemic lupus erythematosus, unspecified: Secondary | ICD-10-CM

## 2020-06-04 DIAGNOSIS — E8889 Other specified metabolic disorders: Secondary | ICD-10-CM

## 2020-06-04 DIAGNOSIS — R7689 Other specified abnormal immunological findings in serum: Secondary | ICD-10-CM

## 2020-06-04 DIAGNOSIS — D693 Immune thrombocytopenic purpura: Secondary | ICD-10-CM | POA: Diagnosis not present

## 2020-06-04 DIAGNOSIS — E559 Vitamin D deficiency, unspecified: Secondary | ICD-10-CM | POA: Diagnosis not present

## 2020-06-04 DIAGNOSIS — Z79899 Other long term (current) drug therapy: Secondary | ICD-10-CM | POA: Diagnosis not present

## 2020-06-04 NOTE — Progress Notes (Signed)
White cell count and platelet counts are lower.  Dr. Candise Che wants to see her back if her platelet count is below 75.  We should recheck CBC with differential every month and CMP with GFR every 3 months.  We will check autoimmune labs in 1 month.

## 2020-06-04 NOTE — Telephone Encounter (Signed)
Patient came to the office to update labs. Patient states she is currently on Imuran 50 mg awaiting increase once labs result. Patient states she is out of medication.   Okay to refill Imuran and at which dose?

## 2020-06-04 NOTE — Telephone Encounter (Signed)
Next Visit: 06/12/2020  Last Visit: 05/01/2020  Last Fill: 05/01/2020  DX: SLE (systemic lupus erythematosus related syndrome)   Current Dose per office note 05/01/2020 :  I will start her on Imuran 25 mg p.o. daily.  We will check labs in 2 weeks x 2.  If labs are normal in 2 weeks then increase the Imuran to 50 mg p.o. daily.  We will continue to monitor labs every 2 months  Labs: 05/20/2020 CMP is normal. Neutropenia noted most likely due to Imuran use. Patient should have repeat labs in 2 weeks which should include CBC with differential, vitamin D, double-stranded DNA, complements, UA.  Attempted to contact the patient and left message to advise patient she is due for labs and to verify the dose.

## 2020-06-04 NOTE — Telephone Encounter (Signed)
Patient called to let Dr. Corliss Skains know that she will come by the office this afternoon 06/04/20 for labwork so it is available for her 06/12/20 appointment.

## 2020-06-04 NOTE — Telephone Encounter (Signed)
Ok to refill imuran 50 mg 1 tablet daily.  Further recommendations cannot be made until lab work has resulted.

## 2020-06-05 ENCOUNTER — Other Ambulatory Visit: Payer: Self-pay

## 2020-06-05 ENCOUNTER — Encounter: Payer: Self-pay | Admitting: Hematology

## 2020-06-05 DIAGNOSIS — Z79899 Other long term (current) drug therapy: Secondary | ICD-10-CM

## 2020-06-05 DIAGNOSIS — M329 Systemic lupus erythematosus, unspecified: Secondary | ICD-10-CM

## 2020-06-05 LAB — COMPLETE METABOLIC PANEL WITH GFR
AG Ratio: 1.4 (calc) (ref 1.0–2.5)
ALT: 10 U/L (ref 6–29)
AST: 20 U/L (ref 10–35)
Albumin: 4.2 g/dL (ref 3.6–5.1)
Alkaline phosphatase (APISO): 86 U/L (ref 37–153)
BUN: 15 mg/dL (ref 7–25)
CO2: 28 mmol/L (ref 20–32)
Calcium: 9 mg/dL (ref 8.6–10.4)
Chloride: 103 mmol/L (ref 98–110)
Creat: 0.94 mg/dL (ref 0.50–0.99)
GFR, Est African American: 73 mL/min/{1.73_m2} (ref >=60)
GFR, Est Non African American: 63 mL/min/{1.73_m2} (ref >=60)
Globulin: 3.1 g/dL (calc) (ref 1.9–3.7)
Glucose, Bld: 98 mg/dL (ref 65–99)
Potassium: 3.5 mmol/L (ref 3.5–5.3)
Sodium: 140 mmol/L (ref 135–146)
Total Bilirubin: 0.6 mg/dL (ref 0.2–1.2)
Total Protein: 7.3 g/dL (ref 6.1–8.1)

## 2020-06-05 LAB — CBC WITH DIFFERENTIAL/PLATELET
Absolute Monocytes: 363 cells/uL (ref 200–950)
Basophils Absolute: 9 cells/uL (ref 0–200)
Basophils Relative: 0.4 %
Eosinophils Absolute: 131 cells/uL (ref 15–500)
Eosinophils Relative: 5.7 %
HCT: 37.2 % (ref 35.0–45.0)
Hemoglobin: 12.2 g/dL (ref 11.7–15.5)
Lymphs Abs: 796 cells/uL — ABNORMAL LOW (ref 850–3900)
MCH: 29.3 pg (ref 27.0–33.0)
MCHC: 32.8 g/dL (ref 32.0–36.0)
MCV: 89.4 fL (ref 80.0–100.0)
MPV: 12.7 fL — ABNORMAL HIGH (ref 7.5–12.5)
Monocytes Relative: 15.8 %
Neutro Abs: 1001 cells/uL — ABNORMAL LOW (ref 1500–7800)
Neutrophils Relative %: 43.5 %
Platelets: 88 10*3/uL — ABNORMAL LOW (ref 140–400)
RBC: 4.16 10*6/uL (ref 3.80–5.10)
RDW: 13.1 % (ref 11.0–15.0)
Total Lymphocyte: 34.6 %
WBC: 2.3 10*3/uL — ABNORMAL LOW (ref 3.8–10.8)

## 2020-06-05 LAB — C3 AND C4
C3 Complement: 108 mg/dL (ref 83–193)
C4 Complement: 34 mg/dL (ref 15–57)

## 2020-06-05 LAB — URINALYSIS, ROUTINE W REFLEX MICROSCOPIC
Bilirubin Urine: NEGATIVE
Glucose, UA: NEGATIVE
Hgb urine dipstick: NEGATIVE
Leukocytes,Ua: NEGATIVE
Nitrite: NEGATIVE
Protein, ur: NEGATIVE
Specific Gravity, Urine: 1.023 (ref 1.001–1.035)
pH: 6.5 (ref 5.0–8.0)

## 2020-06-05 LAB — ANTI-DNA ANTIBODY, DOUBLE-STRANDED: ds DNA Ab: 4 IU/mL

## 2020-06-05 LAB — VITAMIN D 25 HYDROXY (VIT D DEFICIENCY, FRACTURES): Vit D, 25-Hydroxy: 85 ng/mL (ref 30–100)

## 2020-06-12 ENCOUNTER — Ambulatory Visit (INDEPENDENT_AMBULATORY_CARE_PROVIDER_SITE_OTHER): Payer: Medicare HMO | Admitting: Physician Assistant

## 2020-06-12 ENCOUNTER — Encounter: Payer: Self-pay | Admitting: Physician Assistant

## 2020-06-12 ENCOUNTER — Other Ambulatory Visit: Payer: Self-pay

## 2020-06-12 VITALS — BP 114/74 | HR 97 | Resp 14 | Ht 63.0 in | Wt 193.8 lb

## 2020-06-12 DIAGNOSIS — I10 Essential (primary) hypertension: Secondary | ICD-10-CM

## 2020-06-12 DIAGNOSIS — Z96652 Presence of left artificial knee joint: Secondary | ICD-10-CM

## 2020-06-12 DIAGNOSIS — R7989 Other specified abnormal findings of blood chemistry: Secondary | ICD-10-CM

## 2020-06-12 DIAGNOSIS — R760 Raised antibody titer: Secondary | ICD-10-CM

## 2020-06-12 DIAGNOSIS — M545 Low back pain, unspecified: Secondary | ICD-10-CM | POA: Diagnosis not present

## 2020-06-12 DIAGNOSIS — Z8673 Personal history of transient ischemic attack (TIA), and cerebral infarction without residual deficits: Secondary | ICD-10-CM

## 2020-06-12 DIAGNOSIS — M25551 Pain in right hip: Secondary | ICD-10-CM | POA: Diagnosis not present

## 2020-06-12 DIAGNOSIS — Z79899 Other long term (current) drug therapy: Secondary | ICD-10-CM | POA: Diagnosis not present

## 2020-06-12 DIAGNOSIS — M19042 Primary osteoarthritis, left hand: Secondary | ICD-10-CM

## 2020-06-12 DIAGNOSIS — M25511 Pain in right shoulder: Secondary | ICD-10-CM

## 2020-06-12 DIAGNOSIS — D693 Immune thrombocytopenic purpura: Secondary | ICD-10-CM

## 2020-06-12 DIAGNOSIS — M1711 Unilateral primary osteoarthritis, right knee: Secondary | ICD-10-CM

## 2020-06-12 DIAGNOSIS — E8889 Other specified metabolic disorders: Secondary | ICD-10-CM

## 2020-06-12 DIAGNOSIS — M25512 Pain in left shoulder: Secondary | ICD-10-CM

## 2020-06-12 DIAGNOSIS — M19072 Primary osteoarthritis, left ankle and foot: Secondary | ICD-10-CM

## 2020-06-12 DIAGNOSIS — E559 Vitamin D deficiency, unspecified: Secondary | ICD-10-CM

## 2020-06-12 DIAGNOSIS — R7303 Prediabetes: Secondary | ICD-10-CM

## 2020-06-12 DIAGNOSIS — J383 Other diseases of vocal cords: Secondary | ICD-10-CM

## 2020-06-12 DIAGNOSIS — M329 Systemic lupus erythematosus, unspecified: Secondary | ICD-10-CM

## 2020-06-12 DIAGNOSIS — M19041 Primary osteoarthritis, right hand: Secondary | ICD-10-CM

## 2020-06-12 DIAGNOSIS — E210 Primary hyperparathyroidism: Secondary | ICD-10-CM

## 2020-06-12 DIAGNOSIS — Z96642 Presence of left artificial hip joint: Secondary | ICD-10-CM | POA: Diagnosis not present

## 2020-06-12 DIAGNOSIS — M5136 Other intervertebral disc degeneration, lumbar region: Secondary | ICD-10-CM

## 2020-06-12 DIAGNOSIS — I7774 Dissection of vertebral artery: Secondary | ICD-10-CM

## 2020-06-12 DIAGNOSIS — M19071 Primary osteoarthritis, right ankle and foot: Secondary | ICD-10-CM

## 2020-06-12 DIAGNOSIS — G8929 Other chronic pain: Secondary | ICD-10-CM

## 2020-06-12 NOTE — Patient Instructions (Signed)
Mycophenolate capsules What is this medicine? MYCOPHENOLATE MOFETIL (mye koe FEN oh late MOE fe til) is used to decrease the immune system's response to a transplanted organ. This medicine may be used for other purposes; ask your health care provider or pharmacist if you have questions. COMMON BRAND NAME(S): CellCept What should I tell my health care provider before I take this medicine? They need to know if you have any of these conditions:  anemia or other blood disorder  cancer  diarrhea  immune system problems  infection (especially a viral infection such as chickenpox, cold sores, or herpes)  kidney disease  recently received or scheduled to receive a vaccination  stomach problems  an unusual or allergic reaction to mycophenolate mofetil, other medicines, foods, dyes, or preservatives  pregnant or trying to get pregnant  breast-feeding How should I use this medicine? Take this medicine by mouth with a full glass of water. Follow the directions on the prescription label. Take this medicine on an empty stomach, at least 1 hour before or 2 hours after food. Do not take with food unless your doctor approves. Swallow the medicine whole. Do not cut, crush, or chew the medicine. If the medicine is broken or is not intact, do not get the powder on your skin or eyes. If contact occurs, rinse thoroughly with water. Take your medicine at regular intervals. Do not take your medicine more often than directed. Do not stop taking except on your doctor's advice. A special MedGuide will be given to you by the pharmacist with each prescription and refill. Be sure to read this information carefully each time. Talk to your pediatrician regarding the use of this medicine in children. While this drug may be prescribed for selected conditions, precautions do apply. Overdosage: If you think you have taken too much of this medicine contact a poison control center or emergency room at once. NOTE: This  medicine is only for you. Do not share this medicine with others. What if I miss a dose? If you miss a dose, take it as soon as you can. If it is almost time for your next dose, take only that dose. Do not take double or extra doses. What may interact with this medicine? Do not take this medicine with any of the following medications:  live vaccines This medicine may also interact with the following medications:  acyclovir or valacyclovir  azathioprine  birth control pills  certain antibiotics like ciprofloxacin, levofloxacin, norfloxacin, trimethoprim; sulfamethoxazole, penicillin, and amoxicillin; clavulanic acid  certain medicines for stomach problems like lansoprazole, omeprazole, or pantoprazole  cyclosporine  ganciclovir or valganciclovir  isavuconazonium  medicines for cholesterol like cholestyramine and colestipol  metronidazole  other mycophenolate medicines  probenecid  rifampin  sevelamer  stomach acid blockers like magnesium hydroxide and aluminum hydroxide  telmisartan This list may not describe all possible interactions. Give your health care provider a list of all the medicines, herbs, non-prescription drugs, or dietary supplements you use. Also tell them if you smoke, drink alcohol, or use illegal drugs. Some items may interact with your medicine. What should I watch for while using this medicine? Visit your doctor or health care professional for regular checks on your progress. You will need frequent blood checks during the first few months you are receiving the medicine. This medicine can make you more sensitive to the sun. Keep out of the sun. If you cannot avoid being in the sun, wear protective clothing and use sunscreen. Do not use sun lamps or  tanning beds/booths. This medicine can cause birth defects. Do not get pregnant while taking this drug. Females will need to have a negative pregnancy test before starting this medicine. If sexually active, use  2 reliable forms of birth control together for 4 weeks before starting this medicine, while you are taking this medicine, and for 6 weeks after you stop taking this medicine. Birth control pills alone may not work properly while you are taking this medicine. If you think that you might be pregnant talk to your doctor right away. Males who get this medicine must use a condom during sex with females who can get pregnant. If you get a woman pregnant, the baby could have birth defects. The baby could die before they are born. You will need to continue wearing a condom for 90 days after stopping the medicine. Tell your health care provider right away if your partner becomes pregnant while you are taking this medicine. Do not donate sperm while taking this medicine or for 90 days after stopping it. If you get a cold or other infection while receiving this medicine, call your doctor or health care professional. Do not treat yourself. The medicine may decrease your body's ability to fight infections. Do not give blood while taking this medicine or for 6 weeks after stopping it. You may get drowsy or dizzy. Do not drive, use machinery, or do anything that needs mental alertness until you know how this medicine affects you. Do not stand up or sit up quickly, especially if you are an older patient. This reduces the risk of dizzy or fainting spells. What side effects may I notice from receiving this medicine? Side effects that you should report to your doctor or health care professional as soon as possible:  allergic reactions like skin rash, itching or hives, swelling of the face, lips, or tongue  bloody, dark, or tarry stools  changes in vision  dizziness  fever, chills or any other sign of infection  unusual bleeding or bruising  unusually weak or tired Side effects that usually do not require medical attention (report to your doctor or health care professional if they continue or are  bothersome):  constipation  diarrhea  trouble sleeping  loss of appetite  nausea, vomiting This list may not describe all possible side effects. Call your doctor for medical advice about side effects. You may report side effects to FDA at 1-800-FDA-1088. Where should I keep my medicine? Keep out of the reach of children. Store at room temperature between 15 and 30 degrees C (59 and 86 degrees F). Throw away any unused medicine after the expiration date. NOTE: This sheet is a summary. It may not cover all possible information. If you have questions about this medicine, talk to your doctor, pharmacist, or health care provider.  2021 Elsevier/Gold Standard (2018-05-09 18:45:08)

## 2020-06-20 ENCOUNTER — Other Ambulatory Visit: Payer: Self-pay | Admitting: Internal Medicine

## 2020-06-24 ENCOUNTER — Other Ambulatory Visit: Payer: Self-pay

## 2020-06-24 ENCOUNTER — Ambulatory Visit: Payer: Medicare HMO

## 2020-06-24 DIAGNOSIS — Z23 Encounter for immunization: Secondary | ICD-10-CM

## 2020-06-24 NOTE — Progress Notes (Signed)
Pt here for shingrix injection per Dr. Okey Dupre  Shingrix given IM, and pt tolerated injection well.  Next shingrix injection scheduled for 09/17/20

## 2020-07-09 ENCOUNTER — Telehealth: Payer: Self-pay

## 2020-07-09 NOTE — Telephone Encounter (Signed)
Ok to proceed with lab work.

## 2020-07-09 NOTE — Telephone Encounter (Signed)
Patient advised Ok to proceed with lab work.

## 2020-07-09 NOTE — Telephone Encounter (Signed)
Patient called stating she is due for labwork Patient states she had poppyseed dressing over the weekend and wanted to make sure that wouldn't affect her labwork results.  Patient requested a return call.

## 2020-07-11 ENCOUNTER — Other Ambulatory Visit: Payer: Self-pay

## 2020-07-11 DIAGNOSIS — M329 Systemic lupus erythematosus, unspecified: Secondary | ICD-10-CM | POA: Diagnosis not present

## 2020-07-11 DIAGNOSIS — Z79899 Other long term (current) drug therapy: Secondary | ICD-10-CM

## 2020-07-12 ENCOUNTER — Encounter (HOSPITAL_BASED_OUTPATIENT_CLINIC_OR_DEPARTMENT_OTHER): Payer: Self-pay | Admitting: Emergency Medicine

## 2020-07-12 ENCOUNTER — Inpatient Hospital Stay (HOSPITAL_BASED_OUTPATIENT_CLINIC_OR_DEPARTMENT_OTHER)
Admission: EM | Admit: 2020-07-12 | Discharge: 2020-07-14 | DRG: 813 | Disposition: A | Discharge status: Home / Self Care | Payer: Medicare HMO | Source: Ambulatory Visit | Attending: Family Medicine | Admitting: Family Medicine

## 2020-07-12 ENCOUNTER — Other Ambulatory Visit: Payer: Self-pay

## 2020-07-12 ENCOUNTER — Telehealth: Payer: Self-pay | Admitting: Physician Assistant

## 2020-07-12 ENCOUNTER — Encounter: Payer: Self-pay | Admitting: Hematology

## 2020-07-12 DIAGNOSIS — I10 Essential (primary) hypertension: Secondary | ICD-10-CM | POA: Diagnosis not present

## 2020-07-12 DIAGNOSIS — Z881 Allergy status to other antibiotic agents status: Secondary | ICD-10-CM

## 2020-07-12 DIAGNOSIS — Z6833 Body mass index (BMI) 33.0-33.9, adult: Secondary | ICD-10-CM

## 2020-07-12 DIAGNOSIS — D702 Other drug-induced agranulocytosis: Secondary | ICD-10-CM | POA: Diagnosis present

## 2020-07-12 DIAGNOSIS — Z20822 Contact with and (suspected) exposure to covid-19: Secondary | ICD-10-CM | POA: Diagnosis present

## 2020-07-12 DIAGNOSIS — D693 Immune thrombocytopenic purpura: Principal | ICD-10-CM | POA: Diagnosis present

## 2020-07-12 DIAGNOSIS — Z87892 Personal history of anaphylaxis: Secondary | ICD-10-CM | POA: Diagnosis not present

## 2020-07-12 DIAGNOSIS — E669 Obesity, unspecified: Secondary | ICD-10-CM | POA: Diagnosis not present

## 2020-07-12 DIAGNOSIS — Z90710 Acquired absence of both cervix and uterus: Secondary | ICD-10-CM | POA: Diagnosis not present

## 2020-07-12 DIAGNOSIS — E739 Lactose intolerance, unspecified: Secondary | ICD-10-CM | POA: Diagnosis present

## 2020-07-12 DIAGNOSIS — D6862 Lupus anticoagulant syndrome: Secondary | ICD-10-CM | POA: Diagnosis present

## 2020-07-12 DIAGNOSIS — M329 Systemic lupus erythematosus, unspecified: Secondary | ICD-10-CM | POA: Diagnosis present

## 2020-07-12 DIAGNOSIS — H5461 Unqualified visual loss, right eye, normal vision left eye: Secondary | ICD-10-CM | POA: Diagnosis present

## 2020-07-12 DIAGNOSIS — T451X5A Adverse effect of antineoplastic and immunosuppressive drugs, initial encounter: Secondary | ICD-10-CM | POA: Diagnosis present

## 2020-07-12 DIAGNOSIS — E876 Hypokalemia: Secondary | ICD-10-CM | POA: Diagnosis present

## 2020-07-12 DIAGNOSIS — Z79899 Other long term (current) drug therapy: Secondary | ICD-10-CM

## 2020-07-12 DIAGNOSIS — Z8701 Personal history of pneumonia (recurrent): Secondary | ICD-10-CM

## 2020-07-12 DIAGNOSIS — D696 Thrombocytopenia, unspecified: Secondary | ICD-10-CM | POA: Diagnosis not present

## 2020-07-12 DIAGNOSIS — M1990 Unspecified osteoarthritis, unspecified site: Secondary | ICD-10-CM | POA: Diagnosis present

## 2020-07-12 DIAGNOSIS — D709 Neutropenia, unspecified: Secondary | ICD-10-CM | POA: Diagnosis present

## 2020-07-12 DIAGNOSIS — Z87442 Personal history of urinary calculi: Secondary | ICD-10-CM | POA: Diagnosis not present

## 2020-07-12 DIAGNOSIS — Z888 Allergy status to other drugs, medicaments and biological substances status: Secondary | ICD-10-CM | POA: Diagnosis not present

## 2020-07-12 DIAGNOSIS — Z66 Do not resuscitate: Secondary | ICD-10-CM | POA: Diagnosis not present

## 2020-07-12 DIAGNOSIS — Z880 Allergy status to penicillin: Secondary | ICD-10-CM

## 2020-07-12 DIAGNOSIS — Z8673 Personal history of transient ischemic attack (TIA), and cerebral infarction without residual deficits: Secondary | ICD-10-CM | POA: Diagnosis not present

## 2020-07-12 DIAGNOSIS — Z8261 Family history of arthritis: Secondary | ICD-10-CM | POA: Diagnosis not present

## 2020-07-12 LAB — C3 AND C4
C3 Complement: 116 mg/dL (ref 83–193)
C4 Complement: 38 mg/dL (ref 15–57)

## 2020-07-12 LAB — CBC WITH DIFFERENTIAL/PLATELET
Absolute Monocytes: 254 cells/uL (ref 200–950)
Basophils Absolute: 20 cells/uL (ref 0–200)
Basophils Relative: 1 %
Eosinophils Absolute: 68 cells/uL (ref 15–500)
Eosinophils Relative: 3.4 %
HCT: 37.6 % (ref 35.0–45.0)
Hemoglobin: 12.4 g/dL (ref 11.7–15.5)
Lymphs Abs: 736 cells/uL — ABNORMAL LOW (ref 850–3900)
MCH: 29.6 pg (ref 27.0–33.0)
MCHC: 33 g/dL (ref 32.0–36.0)
MCV: 89.7 fL (ref 80.0–100.0)
Monocytes Relative: 12.7 %
Neutro Abs: 922 cells/uL — ABNORMAL LOW (ref 1500–7800)
Neutrophils Relative %: 46.1 %
Platelets: 13 10*3/uL — CL (ref 140–400)
RBC: 4.19 10*6/uL (ref 3.80–5.10)
RDW: 13.7 % (ref 11.0–15.0)
Total Lymphocyte: 36.8 %
WBC: 2 10*3/uL — ABNORMAL LOW (ref 3.8–10.8)

## 2020-07-12 LAB — URINALYSIS, ROUTINE W REFLEX MICROSCOPIC
Bacteria, UA: NONE SEEN /HPF
Bilirubin Urine: NEGATIVE
Glucose, UA: NEGATIVE
Hgb urine dipstick: NEGATIVE
Hyaline Cast: NONE SEEN /LPF
Ketones, ur: NEGATIVE
Nitrite: NEGATIVE
Protein, ur: NEGATIVE
RBC / HPF: NONE SEEN /HPF (ref 0–2)
Specific Gravity, Urine: 1.016 (ref 1.001–1.035)
WBC, UA: NONE SEEN /HPF (ref 0–5)
pH: 6.5 (ref 5.0–8.0)

## 2020-07-12 LAB — COMPLETE METABOLIC PANEL WITH GFR
AG Ratio: 1.3 (calc) (ref 1.0–2.5)
ALT: 13 U/L (ref 6–29)
AST: 23 U/L (ref 10–35)
Albumin: 4.2 g/dL (ref 3.6–5.1)
Alkaline phosphatase (APISO): 80 U/L (ref 37–153)
BUN: 11 mg/dL (ref 7–25)
CO2: 27 mmol/L (ref 20–32)
Calcium: 9.3 mg/dL (ref 8.6–10.4)
Chloride: 103 mmol/L (ref 98–110)
Creat: 0.83 mg/dL (ref 0.50–0.99)
GFR, Est African American: 85 mL/min/{1.73_m2} (ref >=60)
GFR, Est Non African American: 73 mL/min/{1.73_m2} (ref >=60)
Globulin: 3.3 g/dL (calc) (ref 1.9–3.7)
Glucose, Bld: 98 mg/dL (ref 65–99)
Potassium: 3.3 mmol/L — ABNORMAL LOW (ref 3.5–5.3)
Sodium: 138 mmol/L (ref 135–146)
Total Bilirubin: 0.8 mg/dL (ref 0.2–1.2)
Total Protein: 7.5 g/dL (ref 6.1–8.1)

## 2020-07-12 LAB — ANTI-DNA ANTIBODY, DOUBLE-STRANDED: ds DNA Ab: 4 IU/mL

## 2020-07-12 LAB — RESP PANEL BY RT-PCR (FLU A&B, COVID) ARPGX2
Influenza A by PCR: NEGATIVE
Influenza B by PCR: NEGATIVE
SARS Coronavirus 2 by RT PCR: NEGATIVE

## 2020-07-12 LAB — SEDIMENTATION RATE: Sed Rate: 65 mm/h — ABNORMAL HIGH (ref 0–30)

## 2020-07-12 LAB — MICROSCOPIC MESSAGE

## 2020-07-12 MED ORDER — POLYETHYLENE GLYCOL 3350 17 G PO PACK: 17.0000 g | PACK | Freq: Every day | ORAL | Status: DC | PRN

## 2020-07-12 MED ORDER — POTASSIUM CHLORIDE CRYS ER 20 MEQ PO TBCR
40.0000 meq | EXTENDED_RELEASE_TABLET | Freq: Once | ORAL | Status: AC
  Administered 2020-07-12: 40 meq via ORAL
  Filled 2020-07-12: qty 2

## 2020-07-12 MED ORDER — ACETAMINOPHEN 325 MG PO TABS: 650.0000 mg | ORAL_TABLET | Freq: Four times a day (QID) | ORAL | Status: DC | PRN

## 2020-07-12 MED ORDER — ACETAMINOPHEN 650 MG RE SUPP: 650.0000 mg | Freq: Four times a day (QID) | RECTAL | Status: DC | PRN

## 2020-07-12 MED ORDER — DEXAMETHASONE SODIUM PHOSPHATE 10 MG/ML IJ SOLN
20.0000 mg | Freq: Once | INTRAMUSCULAR | Status: AC
  Administered 2020-07-12: 20 mg via INTRAVENOUS
  Filled 2020-07-12: qty 2

## 2020-07-12 MED ORDER — VITAMIN D (ERGOCALCIFEROL) 1.25 MG (50000 UNIT) PO CAPS: 50000.0000 [IU] | ORAL_CAPSULE | ORAL | Status: DC

## 2020-07-12 MED ORDER — DEXAMETHASONE SODIUM PHOSPHATE 10 MG/ML IJ SOLN: 20.0000 mg | INTRAMUSCULAR | Status: DC

## 2020-07-12 MED ORDER — LOSARTAN POTASSIUM 50 MG PO TABS
100.0000 mg | ORAL_TABLET | Freq: Every day | ORAL | Status: DC
  Administered 2020-07-13 – 2020-07-14 (×2): 100 mg via ORAL
  Filled 2020-07-12 (×2): qty 2

## 2020-07-12 MED ORDER — ONDANSETRON HCL 4 MG PO TABS: 4.0000 mg | ORAL_TABLET | Freq: Four times a day (QID) | ORAL | Status: DC | PRN

## 2020-07-12 MED ORDER — HYDROCHLOROTHIAZIDE 25 MG PO TABS
25.0000 mg | ORAL_TABLET | Freq: Every day | ORAL | Status: DC
  Administered 2020-07-13 – 2020-07-14 (×2): 25 mg via ORAL
  Filled 2020-07-12 (×2): qty 1

## 2020-07-12 MED ORDER — ONDANSETRON HCL 4 MG/2ML IJ SOLN: 4.0000 mg | Freq: Four times a day (QID) | INTRAMUSCULAR | Status: DC | PRN

## 2020-07-12 MED ORDER — VITAMIN B-12 1000 MCG PO TABS
1000.0000 ug | ORAL_TABLET | Freq: Every day | ORAL | Status: DC
  Administered 2020-07-13 – 2020-07-14 (×2): 1000 ug via ORAL
  Filled 2020-07-12 (×2): qty 1

## 2020-07-12 MED ORDER — LOSARTAN POTASSIUM-HCTZ 100-25 MG PO TABS: 1.0000 | ORAL_TABLET | Freq: Every day | ORAL | Status: DC

## 2020-07-12 MED ORDER — IMMUNE GLOBULIN (HUMAN) 10 GM/100ML IV SOLN
1.0000 g/kg | INTRAVENOUS | Status: AC
  Administered 2020-07-12 – 2020-07-13 (×2): 85 g via INTRAVENOUS
  Filled 2020-07-12: qty 50
  Filled 2020-07-12: qty 800

## 2020-07-12 NOTE — H&P (Addendum)
History and Physical:    Elaine Allen   BZJ:[id number] DOB: [birth year] DOA: 07/12/2020  Referring MD/provider: Effie Shy, MD PCP: Myrlene Broker, MD   Patient coming from: Home  Chief Complaint: Low platelet count  History of Present Illness:   Elaine Allen is a 65 y.o. 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  She was advised by her hematologist to go to the emergency room because of low platelet count.  She said she went to see a hematologist for routine follow-up and blood work that day prior to admission.  She was called the following day to go to the emergency room because of very low platelet count.  She went to [facility] emergency department and she was subsequently referred to [facility] for direct admission.  Her only complaint is fatigue.  She has no other complaints.  No headache, dizziness, confusion, chest pain, shortness of breath, palpitations, hemoptysis, hematemesis, abdominal pain, bloody stools, hematuria, fever or chills.  She has a history of thrombocytopenia dating back in March 2021.  She said she was treated with IV immunoglobulins in March and April 2021 and platelet count improved.  However, since then she has had recurrent thrombocytopenia requiring close monitoring with periodic CBC.  ED Course:  The patient   ROS:   ROS all other systems reviewed were negative  Past Medical History:   Past Medical History:  Diagnosis Date   Arthritis    Blind right eye    sees colors   Chronic anemia    Detached retina, right    corrected good for about 6 months sees peripheral colors only   History  of kidney stones    Hypertension    Hyperthyroidism    Lupus (HCC)    Pneumonia    Retinal detachment    Spasmodic dysphonia    Stroke (HCC) 2015   no permanant defecits had weakness in left arm in hospital Had PT also   Vertigo    Hx of preceding stroke    Past Surgical History:   Past Surgical History:  Procedure Laterality Date   ABDOMINAL HYSTERECTOMY     BREAST LUMPECTOMY     left breast benign   KNEE SURGERY Left    THYROID SURGERY     TOTAL HIP ARTHROPLASTY Left 06/27/2018   Procedure: TOTAL HIP ARTHROPLASTY ANTERIOR APPROACH;  Surgeon: Gean Birchwood, MD;  Location: [facility];  Service: Orthopedics;  Laterality: Left;    Social History:   Social History   Socioeconomic History   Marital status: Single    Spouse name: Not on file   Number of children: 2   Years of education: college   Highest education level: Not on file  Occupational History   Occupation: options  Tobacco Use   Smoking status: Never   Smokeless tobacco: Never  Vaping Use   Vaping Use: Never used  Substance and Sexual Activity   Alcohol use: No   Drug use: No   Sexual activity: Not Currently  Other Topics Concern   Not on file  Social History Narrative   Not on file   Social Determinants of Health   Financial Resource Strain: Not on file  Food Insecurity: Not on file  Transportation Needs: Not on file  Physical Activity: Not on file  Stress: Not on file  Social Connections: Not on file  Intimate Partner Violence: Not on file    Allergies   Plaquenil [hydroxychloroquine sulfate], Azithromycin, Lactose intolerance (gi), and Penicillin g  Family history:   Family History  Problem Relation Age of Onset   Cancer Mother        liver   Diabetes Mother    Arthritis Mother    Cancer Father        prostate,METS, stomach    Ovarian cancer Sister    Throat cancer Brother     Current Medications:   Prior to Admission medications   Medication Sig Start Date End Date Taking?  Authorizing Provider  azaTHIOprine (IMURAN) 50 MG tablet Take 1 tablet by mouth daily. 06/04/20  Yes Gearldine Bienenstock, PA-C  losartan-hydrochlorothiazide (HYZAAR) 100-25 MG tablet TAKE 1 TABLET BY MOUTH DAILY 06/20/20  Yes Myrlene Broker, MD  vitamin B-12 (CYANOCOBALAMIN) 1000 MCG tablet Take 1,000 mcg by mouth daily.   Yes [provider]  Vitamin D, Ergocalciferol, (DRISDOL) 1.25 MG (50000 UNIT) CAPS capsule Take 1 capsule (50,000 Units total) by mouth every 7 (seven) days. 05/01/20  Yes Deveshwar, Janalyn Rouse, MD  Cholecalciferol (VITAMIN D3 PO) Take 1 tablet by mouth daily. Patient not taking: Reported on 06/12/2020    [provider]  gabapentin (NEURONTIN) 300 MG capsule Take 300 mg by mouth daily. 07/08/20   [provider]  Probiotic Product (PROBIOTIC PO) Take by mouth.    [provider]  traMADol (ULTRAM) 50 MG tablet Take 1 tablet (50 mg total) by mouth every 6 (six) hours as needed. 05/18/20   Cathren Laine, MD    Physical Exam:   Vitals:   07/12/20 1230 07/12/20 1322 07/12/20 1559 07/12/20 1628  BP:  (!) 160/89 (!) 154/92 (!) 149/97  Pulse: 100 79 81 81  Resp:  16 14 18   Temp:  99.3 F (37.4 C)  98.6 F (37 C)  TempSrc:  Oral  Oral  SpO2: 99% 98% 99% 100%     Physical Exam: Blood pressure (!) 149/97, pulse 81, temperature 98.6 F (37 C), temperature source Oral, resp. rate 18, SpO2 100 %. Gen: No acute distress. Head: Normocephalic, atraumatic. Eyes: Pupils equal, round and reactive to light. Extraocular movements intact.  Sclerae nonicteric.  Mouth: Moist mucous membranes.   Neck: Supple, no thyromegaly, no lymphadenopathy, no jugular venous distention. Chest: Lungs are clear to auscultation with good air movement. No rales, rhonchi or wheezes.  CV: Heart sounds are regular with an S1, S2. No murmurs, rubs or gallops.  Abdomen: Soft, nontender, nondistended with normal active bowel sounds. No palpable masses. Extremities: Extremities  are without clubbing, or cyanosis. No edema. Pedal pulses 2+.  Skin: Warm and dry. No rashes, lesions or wounds Neuro: Alert and oriented times 3; grossly nonfocal.  Psych: Insight is good and judgment is appropriate. Mood and affect normal.   Data Review:    Labs: Basic Metabolic Panel: Recent Labs  Lab 07/11/20 1428  NA 138  K 3.3*  CL 103  CO2 27  GLUCOSE 98  BUN 11  CREATININE 0.83  CALCIUM 9.3   Liver Function Tests: Recent Labs  Lab 07/11/20 1428  AST 23  ALT 13  BILITOT 0.8  PROT 7.5   No results for input(s): LIPASE, AMYLASE in the last 168 hours. No results for input(s): AMMONIA in the last 168 hours. CBC: Recent Labs  Lab 07/11/20 1428  WBC 2.0*  NEUTROABS 922*  HGB 12.4  HCT 37.6  MCV 89.7  PLT 13*   Cardiac Enzymes: No results for input(s): CKTOTAL, CKMB, CKMBINDEX, TROPONINI in the last 168 hours.  BNP (last 3 results) No results for input(s): PROBNP in the last 8760 hours. CBG: No results for input(s): GLUCAP in the last 168 hours.  Urinalysis    Component Value Date/Time   COLORURINE YELLOW 07/11/2020 1428   APPEARANCEUR CLEAR 07/11/2020 1428   LABSPEC 1.016 07/11/2020 1428   PHURINE 6.5 07/11/2020 1428   GLUCOSEU NEGATIVE 07/11/2020 1428   GLUCOSEU NEGATIVE 10/28/2016 1054   HGBUR NEGATIVE 07/11/2020 1428   BILIRUBINUR NEGATIVE 06/27/2018 1402   KETONESUR NEGATIVE 07/11/2020 1428   PROTEINUR NEGATIVE 07/11/2020 1428   UROBILINOGEN 1.0 10/28/2016 1054   NITRITE NEGATIVE 07/11/2020 1428   LEUKOCYTESUR TRACE (A) 07/11/2020 1428      Radiographic Studies: No results found.  EKG: Not done   Assessment/Plan:   Active Problems:   Thrombocytopenia (HCC)  Severe thrombocytopenia: Platelet was 13,000.  Platelet count was 88,000 about 5 weeks ago.  This is likely due to immune thrombocytopenia in the setting of SLE.  Admit to MedSurg.  Hematologist has already been consulted and their recommendations have been noted.  Treat  with IV dexamethasone 20 mg daily x4 doses and IV immunoglobulin 1 g/kg daily x2 doses.   Follow-up with rheumatologist for further recommendations.  Neutropenia: Hold Imuran and monitor CBC  Systemic lupus erythematosus: Hold Imuran because of thrombocytopenia and neutropenia  Hypokalemia: Replete potassium.  Hypertension: Continue losartan-HCTZ     Other information:   DVT prophylaxis:   SCDs Code Status: DNR.  CODE STATUS was discussed but patient prefers to be DNR because she said her lupus was taken a toll on her life. Family Communication: None Disposition Plan: Home in 2 to 3 days Consults called: Hematologist Admission status: Inpatient  The medical decision making on this patient was of high complexity and the patient is at high risk for clinical deterioration, therefore this is a level 3 visit.    Emry Tobin [facility] Pager: Please check [email or web address]   How to contact the [facility] Attending or Consulting provider 7A - 7P or covering provider during after hours 7P -7A, for this patient?   Check the care team in [facility] and look for a) attending/consulting [facility] provider listed and b) the [facility] team listed Log into [email or web address] and use [place]'s universal password to access. If you do not have the password, please contact the hospital operator. Locate the [facility] provider you are looking for under [facility] and page to a number that you can be directly reached. If you still have difficulty reaching the provider, please page the [facility] (Director on Call) for the Hospitalists listed on amion for assistance.  07/12/2020, 5:15 PM

## 2020-07-12 NOTE — Progress Notes (Signed)
Brief hematology note:  Elaine Allen is followed by our practice for ITP.  We were notified of planned admission earlier today.  The patient has been at the [facility] emergency department and has not yet transferred to [facility].  Chart has been reviewed.  Her CBC is:  CBC    Component Value Date/Time   WBC 2.0 (L) 07/11/2020 1428   RBC 4.19 07/11/2020 1428   HGB 12.4 07/11/2020 1428   HGB 11.8 (L) 03/22/2020 1159   HGB 13.4 02/05/2012 1205   HCT 37.6 07/11/2020 1428   HCT 39.9 02/05/2012 1205   PLT 13 (LL) 07/11/2020 1428   PLT 110 (L) 03/22/2020 1159   PLT 172 02/05/2012 1205   MCV 89.7 07/11/2020 1428   MCV 88 02/05/2012 1205   MCH 29.6 07/11/2020 1428   MCHC 33.0 07/11/2020 1428   RDW 13.7 07/11/2020 1428   RDW 13.8 02/05/2012 1205   LYMPHSABS 736 (L) 07/11/2020 1428   LYMPHSABS 1.3 02/05/2012 1205   MONOABS 0.3 03/22/2020 1159   MONOABS 0.3 02/05/2012 1205   EOSABS 68 07/11/2020 1428   EOSABS 0.1 02/05/2012 1205   BASOSABS 20 07/11/2020 1428   BASOSABS 0.0 02/05/2012 1205    Recommendations as discussed with dr. Candise Che: 1.  Dexamethasone 20 mg daily x4 doses. 2.  Begin IVIG 1 g/kg x 2 doses upon arrival to [facility] hospital. 3.  Hold Imuran. 4.  Monitor CBC daily.  Transfuse for platelet count less than 10,000 or active bleeding. 5.  Okay to discharge over the weekend from our standpoint if platelet count is 20,000 or higher and after she is completed her 2 doses of IVIG. 6.  Hematology will round over the weekend when she arrives to [facility].  Clenton Pare, DNP, AGPCNP-BC, AOCNP

## 2020-07-12 NOTE — ED Triage Notes (Signed)
Pt reports  her rheumatologist called and stated patient has low platelets. She has lupus. She was on her way to work and MD called her and told her to go to an ER.

## 2020-07-12 NOTE — Progress Notes (Signed)
She had been clinically doing well on low dose Imuran. Labs from yesterday show neutropenia and thrombocytopenia. We advised her to go to the ER. You have treated her with Rituxan in the past. Please advise.

## 2020-07-12 NOTE — ED Provider Notes (Signed)
[facility] EMERGENCY DEPARTMENT Provider Note  CSN: [id number] Arrival date & time: 07/12/20 8811    History Chief Complaint  Patient presents with   Fatigue    Elaine Allen is a 65 y.o. female with history of Lupus, previously on long term prednisone but stopped due to AVN of her R hip which is now s/p THR is currently managed with Imuran. She also has history of ITP, previously treated with steroids and rituxan about 15 months ago. She has had chronic neutropenia (attributed to the Imuran) and gradually decreasing platelets over the last few months. She had bloodwork done yesterday by her Rheumatologist showing PLT of 13. She was called this morning and advised to come to the ED. She denies any bleeding or bruising. No fever. No other change in her baseline.    Past Medical History:  Diagnosis Date   Arthritis    Blind right eye    sees colors   Chronic anemia    Detached retina, right    corrected good for about 6 months sees peripheral colors only   History of kidney stones    Hypertension    Hyperthyroidism    Lupus (HCC)    Pneumonia    Retinal detachment    Spasmodic dysphonia    Stroke (HCC) 2015   no permanant defecits had weakness in left arm in hospital Had PT also   Vertigo    Hx of preceding stroke    Past Surgical History:  Procedure Laterality Date   ABDOMINAL HYSTERECTOMY     BREAST LUMPECTOMY     left breast benign   KNEE SURGERY Left    THYROID SURGERY     TOTAL HIP ARTHROPLASTY Left 06/27/2018   Procedure: TOTAL HIP ARTHROPLASTY ANTERIOR APPROACH;  Surgeon: Gean Birchwood, MD;  Location: [facility];  Service: Orthopedics;  Laterality: Left;    Family History  Problem Relation Age of Onset   Cancer Mother        liver   Diabetes Mother    Arthritis Mother    Cancer Father        prostate,METS, stomach    Ovarian cancer Sister    Throat cancer Brother     Social History   Tobacco Use   Smoking status: Never   Smokeless  tobacco: Never  Vaping Use   Vaping Use: Never used  Substance Use Topics   Alcohol use: No   Drug use: No     Home Medications Prior to Admission medications   Medication Sig Start Date End Date Taking? Authorizing Provider  azaTHIOprine (IMURAN) 50 MG tablet Take 1 tablet by mouth daily. 06/04/20  Yes Gearldine Bienenstock, PA-C  losartan-hydrochlorothiazide (HYZAAR) 100-25 MG tablet TAKE 1 TABLET BY MOUTH DAILY 06/20/20  Yes Myrlene Broker, MD  vitamin B-12 (CYANOCOBALAMIN) 1000 MCG tablet Take 1,000 mcg by mouth daily.   Yes [provider]  Vitamin D, Ergocalciferol, (DRISDOL) 1.25 MG (50000 UNIT) CAPS capsule Take 1 capsule (50,000 Units total) by mouth every 7 (seven) days. 05/01/20  Yes Deveshwar, Janalyn Rouse, MD  Cholecalciferol (VITAMIN D3 PO) Take 1 tablet by mouth daily. Patient not taking: Reported on 06/12/2020    [provider]  Probiotic Product (PROBIOTIC PO) Take by mouth.    [provider]  traMADol (ULTRAM) 50 MG tablet Take 1 tablet (50 mg total) by mouth every 6 (six) hours as needed. 05/18/20   Cathren Laine, MD     Allergies    Plaquenil [  hydroxychloroquine sulfate], Azithromycin, Lactose intolerance (gi), and Penicillin g   Review of Systems   Review of Systems A comprehensive review of systems was completed and negative except as noted in HPI.    Physical Exam BP (!) 177/100   Pulse 98   Temp 98.8 F (37.1 C) (Oral)   Resp 20   SpO2 100%   Physical Exam Vitals and nursing note reviewed.  Constitutional:      Appearance: Normal appearance.  HENT:     Head: Normocephalic and atraumatic.     Nose: Nose normal.     Mouth/Throat:     Mouth: Mucous membranes are moist.  Eyes:     Extraocular Movements: Extraocular movements intact.     Conjunctiva/sclera: Conjunctivae normal.  Cardiovascular:     Rate and Rhythm: Normal rate.  Pulmonary:     Effort: Pulmonary effort is normal.     Breath sounds: Normal breath sounds.   Abdominal:     General: Abdomen is flat.     Palpations: Abdomen is soft.     Tenderness: There is no abdominal tenderness.  Musculoskeletal:        General: No swelling. Normal range of motion.     Cervical back: Neck supple.  Skin:    General: Skin is warm and dry.  Neurological:     General: No focal deficit present.     Mental Status: She is alert.  Psychiatric:        Mood and Affect: Mood normal.     ED Results / Procedures / Treatments   Labs (all labs ordered are listed, but only abnormal results are displayed) Labs Reviewed  RESP PANEL BY RT-PCR (FLU A&B, COVID) ARPGX2    EKG None   Radiology No results found.  Procedures Procedures  Medications Ordered in the ED Medications  dexamethasone (DECADRON) injection 20 mg (20 mg Intravenous Given 07/12/20 0930)     MDM Rules/Calculators/A&P MDM Labs done yesterday reviewed, she is neutropenic and thrombocytopenic. CMP was unremarkable. I spoke with Dr. Candise Che, Hem/Onc, who is familiar with the patient. He requests she be admitted to the hospital and started on Decadron 20mg  IV. Hospitalists have been paged. Patient aware of the plan.   ED Course  I have reviewed the triage vital signs and the nursing notes.  Pertinent labs & imaging results that were available during my care of the patient were reviewed by me and considered in my medical decision making (see chart for details).  Clinical Course as of 07/12/20 1003  Fri Jul 12, 2020  1002 Spoke with Dr. Jul 14, 2020, Hospitalist, who will accept for admission.  [CS]    Clinical Course User Index [CS] Ronaldo Miyamoto, MD    Final Clinical Impression(s) / ED Diagnoses Final diagnoses:  Thrombocytopenia [facility])    Rx / DC Orders ED Discharge Orders     None        [facility], MD 07/12/20 1003

## 2020-07-12 NOTE — ED Notes (Signed)
Pt leaving with [facility] to [facility] from [facility] at this time.

## 2020-07-12 NOTE — Progress Notes (Signed)
Notified by EDP of need for admission d/t possible ITP. [facility] accepts patient to tele bed at [facility]. EDP is to remain responsible for orders/medical decisions while patient is holding at [facility]. Upon arrival to [facility], [facility] will assume care. Nursing staff will call flow manager/carelink to notify them of patient's arrival so that the proper [facility] member may receive the patient. Nursing staff will notify the following consultants, Oncology, of patient's arrival for their evaluation. Thank you.

## 2020-07-12 NOTE — Progress Notes (Signed)
Dr. Candise Che returned my call.  I gave him information regarding Elaine Allen.  He thinks that she is having a relapse of ITP.  He is aware that patient is in the emergency room.  He will contact patient and will start treatment for ITP.

## 2020-07-12 NOTE — Telephone Encounter (Signed)
I received a call from [facility] reporting a critical lab value this morning.  I reviewed the patient's lab work from 07/11/20-platelet count was 13.  I called the patent to advise her to go straight to the emergency department.  She was in agreement.  I also alerted Dr. Corliss Skains who also called Dr. Candise Che (the patient's hematologist).    Sherron Ales, PA-C

## 2020-07-13 LAB — CBC WITH DIFFERENTIAL/PLATELET
Abs Immature Granulocytes: 0.13 10*3/uL — ABNORMAL HIGH (ref 0.00–0.07)
Basophils Absolute: 0 10*3/uL (ref 0.0–0.1)
Basophils Relative: 0 %
Eosinophils Absolute: 0 10*3/uL (ref 0.0–0.5)
Eosinophils Relative: 0 %
HCT: 35.4 % — ABNORMAL LOW (ref 36.0–46.0)
Hemoglobin: 11.9 g/dL — ABNORMAL LOW (ref 12.0–15.0)
Immature Granulocytes: 1 %
Lymphocytes Relative: 8 %
Lymphs Abs: 0.7 10*3/uL (ref 0.7–4.0)
MCH: 30 pg (ref 26.0–34.0)
MCHC: 33.6 g/dL (ref 30.0–36.0)
MCV: 89.2 fL (ref 80.0–100.0)
Monocytes Absolute: 0.7 10*3/uL (ref 0.1–1.0)
Monocytes Relative: 7 %
Neutro Abs: 7.9 10*3/uL — ABNORMAL HIGH (ref 1.7–7.7)
Neutrophils Relative %: 84 %
Platelets: 49 10*3/uL — ABNORMAL LOW (ref 150–400)
RBC: 3.97 MIL/uL (ref 3.87–5.11)
RDW: 13.6 % (ref 11.5–15.5)
WBC: 9.4 10*3/uL (ref 4.0–10.5)
nRBC: 0 % (ref 0.0–0.2)

## 2020-07-13 LAB — BASIC METABOLIC PANEL
Anion gap: 7 (ref 5–15)
BUN: 25 mg/dL — ABNORMAL HIGH (ref 8–23)
CO2: 23 mmol/L (ref 22–32)
Calcium: 8.9 mg/dL (ref 8.9–10.3)
Chloride: 103 mmol/L (ref 98–111)
Creatinine, Ser: 0.97 mg/dL (ref 0.44–1.00)
GFR, Estimated: >60 mL/min (ref >60)
Glucose, Bld: 215 mg/dL — ABNORMAL HIGH (ref 70–99)
Potassium: 3.8 mmol/L (ref 3.5–5.1)
Sodium: 133 mmol/L — ABNORMAL LOW (ref 135–145)

## 2020-07-13 LAB — CBC
HCT: 35.8 % — ABNORMAL LOW (ref 36.0–46.0)
Hemoglobin: 11.6 g/dL — ABNORMAL LOW (ref 12.0–15.0)
MCH: 29.6 pg (ref 26.0–34.0)
MCHC: 32.4 g/dL (ref 30.0–36.0)
MCV: 91.3 fL (ref 80.0–100.0)
Platelets: 31 10*3/uL — ABNORMAL LOW (ref 150–400)
RBC: 3.92 MIL/uL (ref 3.87–5.11)
RDW: 13.6 % (ref 11.5–15.5)
WBC: 4.4 10*3/uL (ref 4.0–10.5)
nRBC: 0 % (ref 0.0–0.2)

## 2020-07-13 LAB — IMMATURE PLATELET FRACTION: Immature Platelet Fraction: 19.6 % — ABNORMAL HIGH (ref 1.2–8.6)

## 2020-07-13 MED ORDER — SODIUM CHLORIDE 0.9 % IV SOLN
20.0000 mg | Freq: Every day | INTRAVENOUS | Status: DC
  Administered 2020-07-13: 20 mg via INTRAVENOUS
  Filled 2020-07-13 (×2): qty 2

## 2020-07-13 NOTE — Consult Note (Signed)
Marland Kitchen  HEMATOLOGY/ONCOLOGY INPATIENT PROGRESS NOTE  Date of Service: 07/13/2020  Inpatient Attending: .Lurene Shadow, MD   SUBJECTIVE  Patient is known to the hematology service and has a h/o ITP secondary to her SLE. SHe was last treated with RItuxan in 04/2019 and went into remission from her immune thrombocytopenia. SHe was been followed by rheumatology Dr Corliss Skains and was on Imuran for her SLE. She was sent to the ED by her rheumatologist after f/u labs showed significant new thrombocytopenia with PLT of 13k . She has also had neutropenia likely from her Imuran. Hematology was consulted for mx of thrombocytopenia. Patient notes no issues with bleeding. No new medications. No use of NSAIDS or other OtC supplements.    OBJECTIVE:  NAD  PHYSICAL EXAMINATION: . Vitals:   07/12/20 2226 07/12/20 2312 07/12/20 2332 07/12/20 2352  BP: 126/82 123/77 123/84 135/76  Pulse: 62 65 64 62  Resp: 15 14 14 16   Temp: 98.2 F (36.8 C) 97.9 F (36.6 C) 98.4 F (36.9 C) 98 F (36.7 C)  TempSrc: Oral Oral Oral Oral  SpO2: 100% 100% 98% 100%  Weight:       Filed Weights   07/12/20 1753  Weight: 189 lb 11.2 oz (86 kg)   .Body mass index is 33.6 kg/m.  GENERAL:alert, in no acute distress and comfortable SKIN: skin color, texture, turgor are normal, no rashes or significant lesions EYES: normal, conjunctiva are pink and non-injected, sclera clear OROPHARYNX:no exudate, no erythema and lips, buccal mucosa, and tongue normal  NECK: supple, no JVD, thyroid normal size, non-tender, without nodularity LYMPH:  no palpable lymphadenopathy in the cervical, axillary or inguinal LUNGS: clear to auscultation with normal respiratory effort HEART: regular rate & rhythm,  no murmurs and no lower extremity edema ABDOMEN: abdomen soft, non-tender, normoactive bowel sounds  Musculoskeletal: no cyanosis of digits and no clubbing  PSYCH: alert & oriented x 3 with fluent speech NEURO: no focal motor/sensory  deficits  MEDICAL HISTORY:  Past Medical History:  Diagnosis Date   Arthritis    Blind right eye    sees colors   Chronic anemia    Detached retina, right    corrected good for about 6 months sees peripheral colors only   History of kidney stones    Hypertension    Hyperthyroidism    Lupus (HCC)    Pneumonia    Retinal detachment    Spasmodic dysphonia    Stroke (HCC) 2015   no permanant defecits had weakness in left arm in hospital Had PT also   Vertigo    Hx of preceding stroke    SURGICAL HISTORY: Past Surgical History:  Procedure Laterality Date   ABDOMINAL HYSTERECTOMY     BREAST LUMPECTOMY     left breast benign   KNEE SURGERY Left    THYROID SURGERY     TOTAL HIP ARTHROPLASTY Left 06/27/2018   Procedure: TOTAL HIP ARTHROPLASTY ANTERIOR APPROACH;  Surgeon: 06/29/2018, MD;  Location: [facility];  Service: Orthopedics;  Laterality: Left;    SOCIAL HISTORY: Social History   Socioeconomic History   Marital status: Single    Spouse name: Not on file   Number of children: 2   Years of education: college   Highest education level: Not on file  Occupational History   Occupation: options  Tobacco Use   Smoking status: Never   Smokeless tobacco: Never  Vaping Use   Vaping Use: Never used  Substance and Sexual Activity   Alcohol use:  No   Drug use: No   Sexual activity: Not Currently  Other Topics Concern   Not on file  Social History Narrative   Not on file   Social Determinants of Health   Financial Resource Strain: Not on file  Food Insecurity: Not on file  Transportation Needs: Not on file  Physical Activity: Not on file  Stress: Not on file  Social Connections: Not on file  Intimate Partner Violence: Not on file    FAMILY HISTORY: Family History  Problem Relation Age of Onset   Cancer Mother        liver   Diabetes Mother    Arthritis Mother    Cancer Father        prostate,METS, stomach    Ovarian cancer Sister    Throat cancer Brother      ALLERGIES:  is allergic to plaquenil [hydroxychloroquine sulfate], azithromycin, lactose intolerance (gi), and penicillin g.  MEDICATIONS:  Scheduled Meds:  dexamethasone (DECADRON) injection  20 mg Intravenous Q24H   losartan  100 mg Oral Daily   And   hydrochlorothiazide  25 mg Oral Daily   vitamin B-12  1,000 mcg Oral Daily   [START ON 07/17/2020] Vitamin D (Ergocalciferol)  50,000 Units Oral Q7 days   Continuous Infusions:  Immune Globulin 10% 206.4 mL/hr at 07/12/20 2353   PRN Meds:.acetaminophen **OR** acetaminophen, ondansetron **OR** ondansetron (ZOFRAN) IV, polyethylene glycol  REVIEW OF SYSTEMS:    10 Point review of Systems was done is negative except as noted above.   LABORATORY DATA:  I have reviewed the data as listed  . CBC Latest Ref Rng & Units 07/11/2020 06/04/2020 05/20/2020  WBC 3.8 - 10.8 Thousand/uL 2.0(L) 2.3(L) 2.5(L)  Hemoglobin 11.7 - 15.5 g/dL 78.6 76.7 20.9  Hematocrit 35.0 - 45.0 % 37.6 37.2 38.2  Platelets 140 - 400 Thousand/uL 13(LL) 88(L) 150    . CMP Latest Ref Rng & Units 07/11/2020 06/04/2020 05/20/2020  Glucose 65 - 99 mg/dL 98 98 88  BUN 7 - 25 mg/dL 11 15 22   Creatinine 0.50 - 0.99 mg/dL 4.70 9.62  Sodium 135 - 146 mmol/L 138 140 138  Potassium 3.5 - 5.3 mmol/L 3.3(L) 3.5 3.7  Chloride 98 - 110 mmol/L 103 103 101  CO2 20 - 32 mmol/L 27 28 25   Calcium 8.6 - 10.4 mg/dL 9.3 9.0 9.7  Total Protein 6.1 - 8.1 g/dL 7.5 7.3 7.7  Total Bilirubin 0.2 - 1.2 mg/dL 0.8 0.6 0.6  Alkaline Phos 38 - 126 U/L - - -  AST 10 - 35 U/L 23 20 23   ALT 6 - 29 U/L 13 10 13      RADIOGRAPHIC STUDIES: I have personally reviewed the radiological images as listed and agreed with the findings in the report. No results found.  ASSESSMENT & PLAN:   Elaine Allen is a 65 y.o. female with:   1. H/o secondary ITP related to lupus. Previously rx with steroids, IVIG and RItuxan in early 2021 with achievement of remission from her ITP.  Patient now  presents with relapse of her ITP despite lupus rx with Imuran. PLT 13k   2. Lupus - on Imuran follows with Dr (Imuran now held)   3. H/o AVN - related to steroids s/p left THA.   4. Lupus anticoagulant positive  5. Neutropenia -- likely related to Imuran   PLAN: - discussed labs with patient. Currently no issues with bleeding. -transfuse platelets prophylactically if plt<10k or if bleeding. -hold  Imuran -Dexamethasone 20mg  po daily for 4 days -IVIG 1g/kg q24hours x 2 doses -twice daily CBC/diff and immat PLT fraction -will setup for outpatient RItuxan. -if platelets stabilized with above treatment will need to have long term plan for mx of lupus and recurrent ITP -- perhaps consideration of cellcept instead of Imuran.     I spent 50 minutes counseling the patient face to face. The total time spent in the appointment was 80 minutes and more than 50% was on counseling and direct patient cares and co-ordination of cares.    MD MS AAHIVMS [facility] Hematology/Oncology Physician [facility]  (Office):       [phone] (Work cell):  [phone] (Fax):           [phone]

## 2020-07-13 NOTE — Progress Notes (Signed)
PROGRESS NOTE  Elaine Allen  HUD:[id number] DOB: 1955/10/30 DOA: 07/12/2020 PCP: Myrlene Broker, MD  Outpatient Specialists: Rheumatology, Dr. Corliss Skains; Hematology, Dr. Candise Che Brief Narrative: Elaine Allen is a 65 y.o. female with a history of SLE on imuran, ITP treated with rituximab with remission who was sent to the ED on recommendation of rheumatology after platelet count found to be 13k. The patient had no bleeding, but had been feeling more run down with fatigue lately. She was admitted with hematology consultation recommending decadron for 4 days, IVIG x2 days, and monitoring of platelet count with plans to transfuse if bleeding or count falls below 10k.  Assessment & Plan: Principal Problem:   Thrombocytopenia (HCC) Active Problems:   Hypokalemia   Neutropenia (HCC)  ITP: No current bleeding. Platelet count improved to 31k today.  - Will complete 2nd of 2 doses of IVIG this evening - Continue decadron 20mg  po daily x4 days (7/8 - 7/11) - Holding imuran - Repeat CBC w/diff and immature platelet fraction BID - Planning to initiate rituximab as outpatient per hematology  SLE: Recent labs indicated normal C3 and C4.  - Holding imuran for now - Rheumatology follow up  Hypokalemia: Supplemented, resolved.   HTN:  - Continue ARB, HCTZ.   Neutropenia: Suspected imuran effect.   History of left AVN s/p THA June 2020: Steroid effect. Quiescent.  Obesity: Estimated body mass index is 33.6 kg/m as calculated from the following:   Height as of 06/12/20: 5\' 3"  (1.6 m).   Weight as of this encounter: 86 kg.  DVT prophylaxis: SCDs Code Status: DNR Family Communication: None at bedside Disposition Plan:  Status is: Inpatient  Remains inpatient appropriate because:Inpatient level of care appropriate due to severity of illness  Dispo: The patient is from: Home              Anticipated d/c is to: Home              Patient currently is not medically stable to  d/c.   Difficult to place patient No  Consultants:  Hematology, Dr. 08/12/20  Procedures:  IVIG  Antimicrobials: None   Subjective: No bleeding. She does admit to about 45 minutes of nose bleeding > 1 week ago. Also has been feeling incredibly fatigued, at times too tired to eat, with poor appetite without fever or chills, for several weeks. No chest pain or dyspnea.  Objective: Vitals:   07/12/20 2312 07/12/20 2332 07/12/20 2352 07/13/20 0434  BP: 123/77 123/84 135/76 127/87  Pulse: 65 64 62 70  Resp: 14 14 16 16   Temp: 97.9 F (36.6 C) 98.4 F (36.9 C) 98 F (36.7 C) 97.9 F (36.6 C)  TempSrc: Oral Oral Oral Oral  SpO2: 100% 98% 100% 98%  Weight:        Intake/Output Summary (Last 24 hours) at 07/13/2020 1338 Last data filed at 07/13/2020 0957 Gross per 24 hour  Intake 240 ml  Output --  Net 240 ml   Filed Weights   07/12/20 1753  Weight: 86 kg    Gen: 65 y.o. female in no distress Pulm: Non-labored breathing room air. Clear to auscultation bilaterally.  CV: Regular rate and rhythm. No murmur, rub, or gallop. No JVD, trace nonpitting pedal edema. GI: Abdomen soft, non-tender, non-distended, with normoactive bowel sounds. No organomegaly or masses felt. Ext: Warm, no deformities Skin: No rashes, lesions or ulcers on visualized skin. Left TKA scar noted.  Neuro: Alert and oriented. No focal neurological deficits.  Psych: Judgement and insight appear normal. Mood & affect appropriate.   Data Reviewed: I have personally reviewed following labs and imaging studies  CBC: Recent Labs  Lab 07/11/20 1428 07/13/20 0533  WBC 2.0* 4.4  NEUTROABS 922*  --   HGB 12.4 11.6*  HCT 37.6 35.8*  MCV 89.7 91.3  PLT 13* 31*   Basic Metabolic Panel: Recent Labs  Lab 07/11/20 1428 07/13/20 0533  NA 138 133*  K 3.3* 3.8  CL 103 103  CO2 27 23  GLUCOSE 98 215*  BUN 11 25*  CREATININE 0.83 0.97  CALCIUM 9.3 8.9   GFR: Estimated Creatinine Clearance: 59.3 mL/min (by C-G  formula based on SCr of 0.97 mg/dL). Liver Function Tests: Recent Labs  Lab 07/11/20 1428  AST 23  ALT 13  BILITOT 0.8  PROT 7.5   No results for input(s): LIPASE, AMYLASE in the last 168 hours. No results for input(s): AMMONIA in the last 168 hours. Coagulation Profile: No results for input(s): INR, PROTIME in the last 168 hours. Cardiac Enzymes: No results for input(s): CKTOTAL, CKMB, CKMBINDEX, TROPONINI in the last 168 hours. BNP (last 3 results) No results for input(s): PROBNP in the last 8760 hours. HbA1C: No results for input(s): HGBA1C in the last 72 hours. CBG: No results for input(s): GLUCAP in the last 168 hours. Lipid Profile: No results for input(s): CHOL, HDL, LDLCALC, TRIG, CHOLHDL, LDLDIRECT in the last 72 hours. Thyroid Function Tests: No results for input(s): TSH, T4TOTAL, FREET4, T3FREE, THYROIDAB in the last 72 hours. Anemia Panel: No results for input(s): VITAMINB12, FOLATE, FERRITIN, TIBC, IRON, RETICCTPCT in the last 72 hours. Urine analysis:    Component Value Date/Time   COLORURINE YELLOW 07/11/2020 1428   APPEARANCEUR CLEAR 07/11/2020 1428   LABSPEC 1.016 07/11/2020 1428   PHURINE 6.5 07/11/2020 1428   GLUCOSEU NEGATIVE 07/11/2020 1428   GLUCOSEU NEGATIVE 10/28/2016 1054   HGBUR NEGATIVE 07/11/2020 1428   BILIRUBINUR NEGATIVE 06/27/2018 1402   KETONESUR NEGATIVE 07/11/2020 1428   PROTEINUR NEGATIVE 07/11/2020 1428   UROBILINOGEN 1.0 10/28/2016 1054   NITRITE NEGATIVE 07/11/2020 1428   LEUKOCYTESUR TRACE (A) 07/11/2020 1428   Recent Results (from the past 240 hour(s))  MICROSCOPIC MESSAGE     Status: None   Collection Time: 07/11/20  2:28 PM  Result Value Ref Range Status   Note   Final    Comment: This urine was analyzed for the presence of WBC,  RBC, bacteria, casts, and other formed elements.  Only those elements seen were reported. . .   Resp Panel by RT-PCR (Flu A&B, Covid) Nasopharyngeal Swab     Status: None   Collection Time:  07/12/20  9:39 AM   Specimen: Nasopharyngeal Swab; Nasopharyngeal(NP) swabs in vial transport medium  Result Value Ref Range Status   SARS Coronavirus 2 by RT PCR NEGATIVE NEGATIVE Final    Comment: (NOTE) SARS-CoV-2 target nucleic acids are NOT DETECTED.  The SARS-CoV-2 RNA is generally detectable in upper respiratory specimens during the acute phase of infection. The lowest concentration of SARS-CoV-2 viral copies this assay can detect is 138 copies/mL. A negative result does not preclude SARS-Cov-2 infection and should not be used as the sole basis for treatment or other patient management decisions. A negative result may occur with  improper specimen collection/handling, submission of specimen other than nasopharyngeal swab, presence of viral mutation(s) within the areas targeted by this assay, and inadequate number of viral copies(<138 copies/mL). A negative result must be combined with clinical  observations, patient history, and epidemiological information. The expected result is Negative.  Fact Sheet for Patients:  BloggerCourse.com  Fact Sheet for Healthcare Providers:  SeriousBroker.it  This test is no t yet approved or cleared by the [place] FDA and  has been authorized for detection and/or diagnosis of SARS-CoV-2 by FDA under an Emergency Use Authorization (EUA). This EUA will remain  in effect (meaning this test can be used) for the duration of the COVID-19 declaration under Section 564(b)(1) of the Act, 21 U.S.C.section 360bbb-3(b)(1), unless the authorization is terminated  or revoked sooner.       Influenza A by PCR NEGATIVE NEGATIVE Final   Influenza B by PCR NEGATIVE NEGATIVE Final    Comment: (NOTE) The Xpert Xpress SARS-CoV-2/FLU/RSV plus assay is intended as an aid in the diagnosis of influenza from Nasopharyngeal swab specimens and should not be used as a sole basis for treatment. Nasal washings  and aspirates are unacceptable for Xpert Xpress SARS-CoV-2/FLU/RSV testing.  Fact Sheet for Patients: BloggerCourse.com  Fact Sheet for Healthcare Providers: SeriousBroker.it  This test is not yet approved or cleared by the [place] FDA and has been authorized for detection and/or diagnosis of SARS-CoV-2 by FDA under an Emergency Use Authorization (EUA). This EUA will remain in effect (meaning this test can be used) for the duration of the COVID-19 declaration under Section 564(b)(1) of the Act, 21 U.S.C. section 360bbb-3(b)(1), unless the authorization is terminated or revoked.  Performed at [facility], [address] [place], [place] [address]       Radiology Studies: No results found.  Scheduled Meds:  losartan  100 mg Oral Daily   And   hydrochlorothiazide  25 mg Oral Daily   vitamin B-12  1,000 mcg Oral Daily   [START ON 07/17/2020] Vitamin D (Ergocalciferol)  50,000 Units Oral Q7 days   Continuous Infusions:  dexamethasone (DECADRON) IVPB ([facility])     Immune Globulin 10% Stopped (07/13/20 0356)     LOS: 1 day   Time spent: 25 minutes.  Tyrone Nine, MD [facility] [email or web address] 07/13/2020, 1:38 PM

## 2020-07-14 LAB — CBC WITH DIFFERENTIAL/PLATELET
Abs Immature Granulocytes: 0.08 K/uL — ABNORMAL HIGH (ref 0.00–0.07)
Basophils Absolute: 0 K/uL (ref 0.0–0.1)
Basophils Relative: 0 %
Eosinophils Absolute: 0 K/uL (ref 0.0–0.5)
Eosinophils Relative: 0 %
HCT: 34.2 % — ABNORMAL LOW (ref 36.0–46.0)
Hemoglobin: 11.1 g/dL — ABNORMAL LOW (ref 12.0–15.0)
Immature Granulocytes: 1 %
Lymphocytes Relative: 7 %
Lymphs Abs: 0.7 K/uL (ref 0.7–4.0)
MCH: 29.6 pg (ref 26.0–34.0)
MCHC: 32.5 g/dL (ref 30.0–36.0)
MCV: 91.2 fL (ref 80.0–100.0)
Monocytes Absolute: 0.4 K/uL (ref 0.1–1.0)
Monocytes Relative: 4 %
Neutro Abs: 8.3 K/uL — ABNORMAL HIGH (ref 1.7–7.7)
Neutrophils Relative %: 88 %
Platelets: 61 K/uL — ABNORMAL LOW (ref 150–400)
RBC: 3.75 MIL/uL — ABNORMAL LOW (ref 3.87–5.11)
RDW: 13.9 % (ref 11.5–15.5)
WBC: 9.5 K/uL (ref 4.0–10.5)
nRBC: 0 % (ref 0.0–0.2)

## 2020-07-14 LAB — IMMATURE PLATELET FRACTION: Immature Platelet Fraction: 15.6 % — ABNORMAL HIGH (ref 1.2–8.6)

## 2020-07-14 MED ORDER — DEXAMETHASONE 20 MG PO TABS: 20.0000 mg | ORAL_TABLET | Freq: Every day | ORAL | 0 refills | Status: DC

## 2020-07-14 MED ORDER — AZATHIOPRINE 50 MG PO TABS: ORAL_TABLET | ORAL | 2 refills | Status: DC

## 2020-07-14 NOTE — Discharge Summary (Signed)
Physician Discharge Summary  [name] IDP:[id number] DOB: 1955-05-04 DOA: 07/12/2020  PCP: Myrlene Broker, MD  Admit date: 07/12/2020 Discharge date: 07/14/2020  Admitted From: Home Disposition: Home   Recommendations for Outpatient Follow-up:  Follow up with hematology, Dr. Candise Che, as well as rheumatology, Dr. Corliss Skains after discharge.  Patient completed 2 days of IVIG and high dose decadron, with plans to continue 2 more days of decadron as outpatient, with increased platelet count 13 > 31 > 61. She will hold azathioprine until follow up.  Home Health: None Equipment/Devices: None Discharge Condition: Stable CODE STATUS: Full Diet recommendation: Regular  Brief/Interim Summary: Elaine Allen is a 65 y.o. female with a history of SLE on imuran, ITP treated with rituximab with remission who was sent to the ED on recommendation of rheumatology after platelet count found to be 13k. The patient had no bleeding, but had been feeling more run down with fatigue lately. She was admitted with hematology consultation recommending decadron 20mg  daily for 4 days, IVIG daily for 2 days, and monitoring of platelet count with plans to transfuse if bleeding or count falls below 10k. Platelet count improved to 61k with no evidence of bleeding at time of discharge 7/10.  Discharge Diagnoses:  Principal Problem:   Thrombocytopenia (HCC) Active Problems:   Hypokalemia   Neutropenia (HCC)  ITP: No current bleeding. Platelet count improved to 31k today. - s/p 2 doses of IVIG and decadron, will decadron 20mg  po daily x4 days (7/8 - 7/11) - Holding imuran pending follow up. Could consider cellcept. - Repeat CBC w/diff and immature platelet fraction at follow up - Planning to initiate rituximab as outpatient per hematology   SLE: Recent labs indicated normal C3 and C4. - Holding imuran for now - Rheumatology follow up   Hypokalemia: Supplemented, resolved.   HTN: - Continue ARB,  HCTZ.   Neutropenia: Suspected imuran effect. Resolved.   History of left AVN s/p THA June 2020: Steroid effect. Quiescent.   Obesity: Estimated body mass index is 33.6 kg/m  Discharge Instructions Discharge Instructions     Discharge instructions   Complete by: As directed    You were admitted for IVIG and steroids to treat low platelet count due to ITP. This has improved significantly and should continue to do so. You are stable for discharge with plans to follow up with hematology, Dr. [id number], and your rheumatologist. In the meantime, complete 2 more days of decadron (take a dose this afternoon and the last dose tomorrow afternoon) [this was sent to your pharmacy]. Please also do not take imuran until you follow up. If you notice bleeding, seek medical attention right away.      Allergies as of 07/14/2020       Reactions   Plaquenil [hydroxychloroquine Sulfate] Anaphylaxis, Other (See Comments)   Blisters all over body   Azithromycin Nausea And Vomiting   Lactose Intolerance (gi) Other (See Comments)   Upset stomach   Penicillin G Itching, Nausea Only   Did it involve swelling of the face/tongue/throat, SOB, or low BP? No No Did it involve sudden or severe rash/hives, skin peeling, or any reaction on the inside of your mouth or nose? Np No Did you need to seek medical attention at a hospital or doctor's office? Yes Yes When did it last happen? around 2010 If all above answers are "NO", may proceed with cephalosporin use.        Medication List     STOP taking these medications  traMADol 50 MG tablet Commonly known as: ULTRAM       TAKE these medications    azaTHIOprine 50 MG tablet Commonly known as: IMURAN Take 1 tablet by mouth daily. HOLD this medication for now What changed: additional instructions   Dexamethasone 20 MG Tabs Take 20 mg by mouth daily for 2 days.   losartan-hydrochlorothiazide 100-25 MG tablet Commonly known as: HYZAAR TAKE 1 TABLET BY  MOUTH DAILY   PROBIOTIC PO Take 1 capsule by mouth daily.   vitamin B-12 1000 MCG tablet Commonly known as: CYANOCOBALAMIN Take 1,000 mcg by mouth daily.   Vitamin D (Ergocalciferol) 1.25 MG (50000 UNIT) Caps capsule Commonly known as: DRISDOL Take 1 capsule (50,000 Units total) by mouth every 7 (seven) days.        Follow-up Information     Myrlene Broker, MD Follow up.   Specialty: Internal Medicine Contact information: [address] [place] [address] [phone]         Pollyann Savoy, MD Follow up.   Specialty: Rheumatology Contact information: [address] [place] [address] [phone]         Johney Maine, MD Follow up.   Specialties: Hematology, Oncology Contact information: [address] [place] [address] [phone]                Allergies  Allergen Reactions   Plaquenil [Hydroxychloroquine Sulfate] Anaphylaxis and Other (See Comments)    Blisters all over body   Azithromycin Nausea And Vomiting   Lactose Intolerance (Gi) Other (See Comments)    Upset stomach   Penicillin G Itching and Nausea Only    Did it involve swelling of the face/tongue/throat, SOB, or low BP? No No Did it involve sudden or severe rash/hives, skin peeling, or any reaction on the inside of your mouth or nose? Np No Did you need to seek medical attention at a hospital or doctor's office? Yes Yes When did it last happen? around 2010 If all above answers are "NO", may proceed with cephalosporin use.     Consultations: Oncology  Procedures/Studies: No results found.  Subjective: Feels well, dressed in home clothes this morning. No bleeding.  Discharge Exam: Vitals:   07/13/20 2347 07/14/20 0520  BP: 127/80 130/79  Pulse: 66 69  Resp: 15 16  Temp: 97.9 F (36.6 C) 97.7 F (36.5 C)  SpO2: 99% 98%   General: Pt is alert, awake, not in acute distress Cardiovascular: RRR, S1/S2 +, no rubs,  no gallops Respiratory: CTA bilaterally, no wheezing, no rhonchi Abdominal: Soft, NT, ND, bowel sounds + Extremities: No edema, no cyanosis  Labs: BNP (last 3 results) No results for input(s): BNP in the last 8760 hours. Basic Metabolic Panel: Recent Labs  Lab 07/11/20 1428 07/13/20 0533  NA 138 133*  K 3.3* 3.8  CL 103 103  CO2 27 23  GLUCOSE 98 215*  BUN 11 25*  CREATININE 0.83 0.97  CALCIUM 9.3 8.9   Liver Function Tests: Recent Labs  Lab 07/11/20 1428  AST 23  ALT 13  BILITOT 0.8  PROT 7.5   No results for input(s): LIPASE, AMYLASE in the last 168 hours. No results for input(s): AMMONIA in the last 168 hours. CBC: Recent Labs  Lab 07/11/20 1428 07/13/20 0533 07/13/20 1636 07/14/20 0541  WBC 2.0* 4.4 9.4 9.5  NEUTROABS 922*  --  7.9* 8.3*  HGB 12.4 11.6* 11.9* 11.1*  HCT 37.6 35.8* 35.4* 34.2*  MCV 89.7 91.3  89.2 91.2  PLT 13* 31* 49* 61*   Cardiac Enzymes: No results for input(s): CKTOTAL, CKMB, CKMBINDEX, TROPONINI in the last 168 hours. BNP: Invalid input(s): POCBNP CBG: No results for input(s): GLUCAP in the last 168 hours. D-Dimer No results for input(s): DDIMER in the last 72 hours. Hgb A1c No results for input(s): HGBA1C in the last 72 hours. Lipid Profile No results for input(s): CHOL, HDL, LDLCALC, TRIG, CHOLHDL, LDLDIRECT in the last 72 hours. Thyroid function studies No results for input(s): TSH, T4TOTAL, T3FREE, THYROIDAB in the last 72 hours.  Invalid input(s): FREET3 Anemia work up No results for input(s): VITAMINB12, FOLATE, FERRITIN, TIBC, IRON, RETICCTPCT in the last 72 hours. Urinalysis    Component Value Date/Time   COLORURINE YELLOW 07/11/2020 1428   APPEARANCEUR CLEAR 07/11/2020 1428   LABSPEC 1.016 07/11/2020 1428   PHURINE 6.5 07/11/2020 1428   GLUCOSEU NEGATIVE 07/11/2020 1428   GLUCOSEU NEGATIVE 10/28/2016 1054   HGBUR NEGATIVE 07/11/2020 1428   BILIRUBINUR NEGATIVE 06/27/2018 1402   KETONESUR NEGATIVE 07/11/2020  1428   PROTEINUR NEGATIVE 07/11/2020 1428   UROBILINOGEN 1.0 10/28/2016 1054   NITRITE NEGATIVE 07/11/2020 1428   LEUKOCYTESUR TRACE (A) 07/11/2020 1428    Microbiology Recent Results (from the past 240 hour(s))  MICROSCOPIC MESSAGE     Status: None   Collection Time: 07/11/20  2:28 PM  Result Value Ref Range Status   Note   Final    Comment: This urine was analyzed for the presence of WBC,  RBC, bacteria, casts, and other formed elements.  Only those elements seen were reported. . .   Resp Panel by RT-PCR (Flu A&B, Covid) Nasopharyngeal Swab     Status: None   Collection Time: 07/12/20  9:39 AM   Specimen: Nasopharyngeal Swab; Nasopharyngeal(NP) swabs in vial transport medium  Result Value Ref Range Status   SARS Coronavirus 2 by RT PCR NEGATIVE NEGATIVE Final    Comment: (NOTE) SARS-CoV-2 target nucleic acids are NOT DETECTED.  The SARS-CoV-2 RNA is generally detectable in upper respiratory specimens during the acute phase of infection. The lowest concentration of SARS-CoV-2 viral copies this assay can detect is 138 copies/mL. A negative result does not preclude SARS-Cov-2 infection and should not be used as the sole basis for treatment or other patient management decisions. A negative result may occur with  improper specimen collection/handling, submission of specimen other than nasopharyngeal swab, presence of viral mutation(s) within the areas targeted by this assay, and inadequate number of viral copies(<138 copies/mL). A negative result must be combined with clinical observations, patient history, and epidemiological information. The expected result is Negative.  Fact Sheet for Patients:  BloggerCourse.comhttps://www.fda.gov/media/152166/download  Fact Sheet for Healthcare Providers:  SeriousBroker.ithttps://www.fda.gov/media/152162/download  This test is no t yet approved or cleared by the [place] FDA and  has been authorized for detection and/or diagnosis of SARS-CoV-2 by FDA under an  Emergency Use Authorization (EUA). This EUA will remain  in effect (meaning this test can be used) for the duration of the COVID-19 declaration under Section 564(b)(1) of the Act, 21 U.S.C.section 360bbb-3(b)(1), unless the authorization is terminated  or revoked sooner.       Influenza A by PCR NEGATIVE NEGATIVE Final   Influenza B by PCR NEGATIVE NEGATIVE Final    Comment: (NOTE) The Xpert Xpress SARS-CoV-2/FLU/RSV plus assay is intended as an aid in the diagnosis of influenza from Nasopharyngeal swab specimens and should not be used as a sole basis for treatment. Nasal washings and aspirates are  unacceptable for Xpert Xpress SARS-CoV-2/FLU/RSV testing.  Fact Sheet for Patients: BloggerCourse.com  Fact Sheet for Healthcare Providers: SeriousBroker.it  This test is not yet approved or cleared by the [place] FDA and has been authorized for detection and/or diagnosis of SARS-CoV-2 by FDA under an Emergency Use Authorization (EUA). This EUA will remain in effect (meaning this test can be used) for the duration of the COVID-19 declaration under Section 564(b)(1) of the Act, 21 U.S.C. section 360bbb-3(b)(1), unless the authorization is terminated or revoked.  Performed at [facility], [address], [place], [place] [address]     Time coordinating discharge: Approximately 40 minutes  Tyrone Nine, MD  [facility] 07/14/2020, 7:54 AM

## 2020-07-14 NOTE — Progress Notes (Signed)
Patient discharged to home, discharge instructions reviewed with patient who verbalized understanding.  

## 2020-07-15 ENCOUNTER — Other Ambulatory Visit: Payer: Self-pay | Admitting: Hematology

## 2020-07-15 ENCOUNTER — Telehealth: Payer: Self-pay | Admitting: Hematology

## 2020-07-15 ENCOUNTER — Telehealth: Payer: Self-pay

## 2020-07-15 NOTE — Telephone Encounter (Signed)
Please schedule sooner office visit this week.  Ok to put the patient on my schedule.  I will also further discuss the next steps with Dr. Corliss Skains.

## 2020-07-15 NOTE — Telephone Encounter (Signed)
Patient scheduled for 07/16/2020 at 9:20 am.

## 2020-07-15 NOTE — Telephone Encounter (Signed)
Patient left a voicemail to let Dr. Corliss Skains know that she went to the hospital on Friday and was discharged on Sunday.  Patient states she is scheduled for a follow-up appointment on 08/14/20 and requested a return call to let her know if she should keep that appointment or be seen sooner.

## 2020-07-15 NOTE — Telephone Encounter (Signed)
Scheduled appointment per 07/11 sch msg. Patient is aware. 

## 2020-07-15 NOTE — Telephone Encounter (Signed)
Attempted to contact the patient and left message for patient to call the office.  

## 2020-07-16 ENCOUNTER — Encounter: Payer: Self-pay | Admitting: Physician Assistant

## 2020-07-16 ENCOUNTER — Other Ambulatory Visit: Payer: Self-pay

## 2020-07-16 ENCOUNTER — Ambulatory Visit: Payer: Medicare HMO | Admitting: Physician Assistant

## 2020-07-16 VITALS — BP 123/73 | HR 65 | Ht 63.0 in | Wt 195.4 lb

## 2020-07-16 DIAGNOSIS — Z79899 Other long term (current) drug therapy: Secondary | ICD-10-CM

## 2020-07-16 DIAGNOSIS — Z96642 Presence of left artificial hip joint: Secondary | ICD-10-CM

## 2020-07-16 DIAGNOSIS — M1711 Unilateral primary osteoarthritis, right knee: Secondary | ICD-10-CM | POA: Diagnosis not present

## 2020-07-16 DIAGNOSIS — M5136 Other intervertebral disc degeneration, lumbar region: Secondary | ICD-10-CM

## 2020-07-16 DIAGNOSIS — R760 Raised antibody titer: Secondary | ICD-10-CM | POA: Diagnosis not present

## 2020-07-16 DIAGNOSIS — E8889 Other specified metabolic disorders: Secondary | ICD-10-CM

## 2020-07-16 DIAGNOSIS — M19042 Primary osteoarthritis, left hand: Secondary | ICD-10-CM

## 2020-07-16 DIAGNOSIS — Z8673 Personal history of transient ischemic attack (TIA), and cerebral infarction without residual deficits: Secondary | ICD-10-CM

## 2020-07-16 DIAGNOSIS — M19041 Primary osteoarthritis, right hand: Secondary | ICD-10-CM

## 2020-07-16 DIAGNOSIS — M329 Systemic lupus erythematosus, unspecified: Secondary | ICD-10-CM

## 2020-07-16 DIAGNOSIS — I10 Essential (primary) hypertension: Secondary | ICD-10-CM

## 2020-07-16 DIAGNOSIS — D693 Immune thrombocytopenic purpura: Secondary | ICD-10-CM

## 2020-07-16 DIAGNOSIS — I7774 Dissection of vertebral artery: Secondary | ICD-10-CM

## 2020-07-16 DIAGNOSIS — R7989 Other specified abnormal findings of blood chemistry: Secondary | ICD-10-CM | POA: Diagnosis not present

## 2020-07-16 DIAGNOSIS — M19072 Primary osteoarthritis, left ankle and foot: Secondary | ICD-10-CM

## 2020-07-16 DIAGNOSIS — R7303 Prediabetes: Secondary | ICD-10-CM

## 2020-07-16 DIAGNOSIS — J383 Other diseases of vocal cords: Secondary | ICD-10-CM

## 2020-07-16 DIAGNOSIS — E559 Vitamin D deficiency, unspecified: Secondary | ICD-10-CM

## 2020-07-16 DIAGNOSIS — M51369 Other intervertebral disc degeneration, lumbar region without mention of lumbar back pain or lower extremity pain: Secondary | ICD-10-CM

## 2020-07-16 DIAGNOSIS — E210 Primary hyperparathyroidism: Secondary | ICD-10-CM

## 2020-07-16 DIAGNOSIS — M19071 Primary osteoarthritis, right ankle and foot: Secondary | ICD-10-CM

## 2020-07-16 NOTE — Progress Notes (Signed)
Office Visit Note  Patient: Elaine Allen             Date of Birth: 02-06-1955           MRN: [id number]             PCP: Myrlene Broker, MD Referring: Myrlene Broker, * Visit Date: 07/16/2020 Occupation: @GUAROCC @  Subjective:  Discuss medications   History of Present Illness: Elaine Allen is a 65 y.o. female with history of systemic lupus erythematosus, and ITP.  She has been tolerating Imuran 50 mg 1 tablet by mouth daily since her last office visit on 06/12/20.  She had updated lab work drawn on 07/11/2020 which revealed a drop in her platelet count.  She was advised to go straight to the emergency room and was admitted for further evaluation and management.  She was started on IVIG x2 as well as IV Decadron for management of ITP. Dr. 09/11/2020 rounded on the patient while hospitalized.   Patient reports that prior to being hospitalized she was experiencing some fatigue on a daily basis but overall has been feeling much better on Imuran.  She states that she did not notice any increased bruising and did not have any bleeding prior to being admitted.  She states that her joint pain and joint swelling was well managed on Imuran.  She denies any recent rashes, oral or nasal ulcerations, sicca symptoms, symptoms of Raynaud's, swollen lymph nodes, pleuritic chest pain, or shortness of breath.  Her alopecia is unchanged.  She states when she was previously on rituxan it managed her ITP but did not manage her lupus symptoms.  She had difficulty getting out of bed some days due to the arthralgias and fatigue.     Activities of Daily Living:  Patient reports morning stiffness for 2-3 hours.   Patient Denies nocturnal pain.  Difficulty dressing/grooming: Denies Difficulty climbing stairs: Denies Difficulty getting out of chair: Denies Difficulty using hands for taps, buttons, cutlery, and/or writing: Reports  Review of Systems  Constitutional:  Positive for fatigue.  HENT:   Positive for nose dryness. Negative for mouth sores and mouth dryness.   Eyes:  Positive for dryness. Negative for pain and itching.  Respiratory:  Negative for shortness of breath and difficulty breathing.   Cardiovascular:  Negative for chest pain and palpitations.  Gastrointestinal:  Negative for blood in stool, constipation and diarrhea.  Endocrine: Negative for increased urination.  Genitourinary:  Negative for difficulty urinating.  Musculoskeletal:  Positive for joint pain, joint pain, joint swelling, myalgias, morning stiffness, muscle tenderness and myalgias.  Skin:  Negative for color change, rash and redness.  Allergic/Immunologic: Negative for susceptible to infections.  Neurological:  Positive for weakness. Negative for dizziness, numbness, headaches and memory loss.  Hematological:  Positive for bruising/bleeding tendency.  Psychiatric/Behavioral:  Negative for confusion.    PMFS History:  Patient Active Problem List   Diagnosis Date Noted   Thrombocytopenia (HCC) 07/12/2020   Hypokalemia 07/12/2020   Neutropenia (HCC) 07/12/2020   Routine general medical examination at a health care facility 03/15/2020   Immune thrombocytopenia (HCC) 03/26/2019   AVN of femur (HCC) 06/23/2018   Greater trochanteric bursitis of left hip 04/08/2018   Left knee pain 04/06/2018   Numbness in feet 09/14/2017   Elevated LFTs 02/24/2017   Pre-diabetes 02/19/2017   Fatigue 02/19/2017   OA (osteoarthritis) of hip 10/29/2016   Numbness and tingling in left arm 05/05/2016   Cough 12/25/2015  Wheezing 12/25/2015   Abdominal pain 12/25/2015   Back pain 10/01/2015   Insomnia 03/01/2015   Family hx of colon cancer 11/12/2014   Essential hypertension, benign 05/15/2013   Other and unspecified hyperlipidemia 05/15/2013   Vertebral artery dissection ([place]) 05/14/2013   Hx of completed stroke 05/14/2013   Chronic anemia    Primary osteoarthritis of right knee 12/19/2007   Primary  hyperparathyroidism ([place]) 11/24/2007   Osteopenia 11/24/2007   Lupus (systemic lupus erythematosus) ([place]) 01/05/2002    Past Medical History:  Diagnosis Date   Arthritis    Blind right eye    sees colors   Chronic anemia    Detached retina, right    corrected good for about 6 months sees peripheral colors only   History of kidney stones    Hypertension    Hyperthyroidism    Lupus ([place])    Pneumonia    Retinal detachment    Spasmodic dysphonia    Stroke ([place]) 2015   no permanant defecits had weakness in left arm in hospital Had PT also   Vertigo    Hx of preceding stroke    Family History  Problem Relation Age of Onset   Cancer Mother        liver   Diabetes Mother    Arthritis Mother    Cancer Father        prostate,METS, stomach    Ovarian cancer Sister    Throat cancer Brother    Past Surgical History:  Procedure Laterality Date   ABDOMINAL HYSTERECTOMY     BREAST LUMPECTOMY     left breast benign   KNEE SURGERY Left    THYROID SURGERY     TOTAL HIP ARTHROPLASTY Left 06/27/2018   Procedure: TOTAL HIP ARTHROPLASTY ANTERIOR APPROACH;  Surgeon: Frederik Pear, MD;  Location: [facility];  Service: Orthopedics;  Laterality: Left;   Social History   Social History Narrative   Not on file   Immunization History  Administered Date(s) Administered   Fluad Quad(high Dose 65+) 10/21/2018   Hep A / Hep B 03/08/2017   Influenza Whole 12/19/2007   Influenza,inj,Quad PF,6+ Mos 11/12/2014, 10/01/2015, 10/28/2016, 02/09/2018   Influenza-Unspecified 11/15/2013   PFIZER(Purple Top)SARS-COV-2 Vaccination 06/06/2019, 07/06/2019   PPD Test 09/12/2013, 01/09/2015, 03/18/2016, 03/23/2016, 08/10/2017   Pneumococcal Conjugate-13 10/21/2018   Pneumococcal Polysaccharide-23 03/14/2020   Tdap 12/05/2014   Zoster Recombinat (Shingrix) 06/24/2020   Zoster, Live 01/02/2015     Objective: Vital Signs: BP 123/73 (BP Location: Left Arm, Patient Position: Sitting, Cuff Size: Normal)    Pulse 65   Ht $R'5\' 3"'YI$  (1.6 m)   Wt 195 lb 6.4 oz (88.6 kg)   BMI 34.61 kg/m    Physical Exam Vitals and nursing note reviewed.  Constitutional:      Appearance: She is well-developed.  HENT:     Head: Normocephalic and atraumatic.  Eyes:     Conjunctiva/sclera: Conjunctivae normal.  Pulmonary:     Effort: Pulmonary effort is normal.  Abdominal:     Palpations: Abdomen is soft.  Musculoskeletal:     Cervical back: Normal range of motion.  Skin:    General: Skin is warm and dry.     Capillary Refill: Capillary refill takes less than 2 seconds.  Neurological:     Mental Status: She is alert and oriented to person, place, and time.  Psychiatric:        Behavior: Behavior normal.     Musculoskeletal Exam: C-spine, thoracic spine, and lumbar spine  good ROM.  Shoulder joints and elbow joints good ROM with no discomfort.  Some synovial thickening and slightly limited ROM of both wrist joints.  No tenderness or synovitis of MCP joints.  Complete fist formation bilaterally.  Hip joints good ROM with no discomfort.  Knee joints good ROM with no warmth or effusion.  Ankle joints good ROM with no tenderness or joint swelling.   CDAI Exam: CDAI Score: -- Patient Global: --; Provider Global: -- Swollen: --; Tender: -- Joint Exam 07/16/2020   No joint exam has been documented for this visit   There is currently no information documented on the homunculus. Go to the Rheumatology activity and complete the homunculus joint exam.  Investigation: No additional findings.  Imaging: No results found.  Recent Labs: Lab Results  Component Value Date   WBC 9.5 07/14/2020   HGB 11.1 (L) 07/14/2020   PLT 61 (L) 07/14/2020   NA 133 (L) 07/13/2020   K 3.8 07/13/2020   CL 103 07/13/2020   CO2 23 07/13/2020   GLUCOSE 215 (H) 07/13/2020   BUN 25 (H) 07/13/2020   CREATININE 0.97 07/13/2020   BILITOT 0.8 07/11/2020   ALKPHOS 100 03/22/2020   AST 23 07/11/2020   ALT 13 07/11/2020   PROT 7.5  07/11/2020   ALBUMIN 3.7 03/22/2020   CALCIUM 8.9 07/13/2020   GFRAA 85 07/11/2020   QFTBGOLDPLUS NEGATIVE 01/31/2020    Speciality Comments: PLQ-blisters all over the body  Procedures:  No procedures performed Allergies: Plaquenil [hydroxychloroquine sulfate], Azithromycin, Lactose intolerance (gi), and Penicillin g   Assessment / Plan:     Visit Diagnoses: SLE (systemic lupus erythematosus related syndrome) ([place]): Dxd in [place]. History of rash, hives, fatigue, nasal ulcers, sicca symptoms, hair loss, Raynauds, arthralgias. +ANA,+ Ro , elevated sed rate, ITP secondary to SLE: She had noticed significant improvement in her arthralgias, joint stiffness, and fatigue while taking imuran from end of 4/22-07/11/20.  She was tolerating imuran and had not been exhibiting any signs or symptoms of a lupus flare while on low dose imuran 50 mg daily.  She had routine lab work drawn on 07/11/20: complements WNL, ESR 65, and dsDNA was negative.  Her platelet count dropped to 13k, so she was advised to go to the ED ASAP.  Dr. Estanislado Pandy and Dr. Irene Limbo were notified at that time.  She was admitted to the hospital and received dexamethasone 20 mg daily for 4 days as well as IVIG x2.  She was discharged on 07/14/20, and she was advised to discontinue Imuran and to follow up with [place] and Dr  Irene Limbo outpatient. She was not exhibiting any excessive bruising or bleeding prior to or during her admission.  I reviewed the patients chart in detail and discussed with Dr. Estanislado Pandy today.  We will hold off on adding cellcept at this time until the patient has seen Dr. Irene Limbo to determine when she will be scheduled to restart rituxan outpatient.  Consent to start on Cellcept was obtained at her last OV on 06/12/20, so she will be ok to proceed with cellcept once approved by Dr. Irene Limbo.  The patient is in agreement.  She was advised to notify us if she develops any signs or symptoms of a lupus flare.   She will follow up with Dr. Estanislado Pandy  in 6 weeks.  High risk medication use - Advised to Imuran-neutropenia and relapse of ITP.  Consent obtained to start on Cellcept at her last OV on 06/12/20.  We will hold  off on starting the patient on cellcept until the plan to reinitiate rixuxan has been determined.   Immune thrombocytopenia (HCC) -History of ITP secondary to SLE.  Managed by Dr. Irene Limbo. Previously treated with rituxan, steriods, and IVIG starting in April 2021 and achieved remission. She had a relapse of ITP recently requiring hospitalization on 07/12/20.  Her platelet count dropped to 13k on 07/11/20.  She had not noticed any increased bruising and had no excessive bleeding prior to or during the hospitalization was noted. She was advised to hold imuran and received dexamethasone 20 mg daily for 4 days as well as IVIG x2.  Dr. Irene Limbo plans to restart outpatient rituxan infusions.  She has an upcoming appointment scheduled on 07/26/20.   Lupus anticoagulant positive  TPMT intermediate metabolizer ([place]): Advised to discontinue Imuran.    Elevated LFTs: WNL on 07/11/20.   Primary osteoarthritis of both hands: Osteoarthritic changes noted on exam and on x-rays. She has no tenderness or inflammation on examination today.  Complete fist formation bilaterally.   Status post total hip replacement, left - History of left AVN s/p THA June 2020-SE of Steroid use.     Primary osteoarthritis of right knee: She has good ROM of the right knee joint on exam with no discomfort.  No warmth or effusion noted.   Primary osteoarthritis of both feet: She is not experiencing any discomfort in her feet at this time.   DDD (degenerative disc disease), lumbar: X-rays ordered by PCP revealed degenerative changes.   Other medical conditions are listed as follows:   Hx of completed stroke  Vertebral artery dissection (HCC)  Spasmodic dysphonia  Essential hypertension, benign - Continnue ARB and HCTZ.  BP was 123/73 today in the office.    Pre-diabetes  Primary hyperparathyroidism ([place])  Vitamin D deficiency   Orders: No orders of the defined types were placed in this encounter.  No orders of the defined types were placed in this encounter.    Follow-Up Instructions: Return in about 6 weeks (around 08/27/2020) for Systemic lupus erythematosus.   Ofilia Neas, PA-C  Note - This record has been created using Dragon software.  Chart creation errors have been sought, but may not always  have been located. Such creation errors do not reflect on  the standard of medical care.

## 2020-07-25 NOTE — Progress Notes (Signed)
HEMATOLOGY/ONCOLOGY CLINIC NOTE  Date of Service: 07/25/2020  Patient Care Team: Myrlene Broker, MD as PCP - General (Internal Medicine)  REFERRING PHYSICIAN: Myrlene Broker, *  CHIEF COMPLAINTS/PURPOSE OF CONSULTATION:  F/u for mx of ITP   INTERVAL HISTORY  Elaine Allen is a wonderful 65 y.o. female who is here today for f/u for evaluation and management of Thrombocytopenia. She was recently seen in the hospital and received dexamethasone and IVIG .  The pt reports she continues to be off Imuran. PLT today wnl. Starting Rituxan today  Lab results today 07/26/2020 of CBC w/diff and CMP - reviewed with patient.  On review of systems, pt reports some bodyaches from SLE but no other acute new symptoms. No bleeding issues or abnormal bruising.   MEDICAL HISTORY:  Past Medical History:  Diagnosis Date   Arthritis    Blind right eye    sees colors   Chronic anemia    Detached retina, right    corrected good for about 6 months sees peripheral colors only   History of kidney stones    Hypertension    Hyperthyroidism    Lupus (HCC)    Pneumonia    Retinal detachment    Spasmodic dysphonia    Stroke (HCC) 2015   no permanant defecits had weakness in left arm in hospital Had PT also   Vertigo    Hx of preceding stroke     SURGICAL HISTORY: Past Surgical History:  Procedure Laterality Date   ABDOMINAL HYSTERECTOMY     BREAST LUMPECTOMY     left breast benign   KNEE SURGERY Left    THYROID SURGERY     TOTAL HIP ARTHROPLASTY Left 06/27/2018   Procedure: TOTAL HIP ARTHROPLASTY ANTERIOR APPROACH;  Surgeon: Gean Birchwood, MD;  Location: [facility];  Service: Orthopedics;  Laterality: Left;     SOCIAL HISTORY: Social History   Socioeconomic History   Marital status: Single    Spouse name: Not on file   Number of children: 2   Years of education: college   Highest education level: Not on file  Occupational History   Occupation: [employer]  Tobacco  Use   Smoking status: Never   Smokeless tobacco: Never  Vaping Use   Vaping Use: Never used  Substance and Sexual Activity   Alcohol use: No   Drug use: No   Sexual activity: Not Currently  Other Topics Concern   Not on file  Social History Narrative   Not on file   Social Determinants of Health   Financial Resource Strain: Not on file  Food Insecurity: Not on file  Transportation Needs: Not on file  Physical Activity: Not on file  Stress: Not on file  Social Connections: Not on file  Intimate Partner Violence: Not on file     FAMILY HISTORY: Family History  Problem Relation Age of Onset   Cancer Mother        liver   Diabetes Mother    Arthritis Mother    Cancer Father        prostate,METS, stomach    Ovarian cancer Sister    Throat cancer Brother      ALLERGIES:   is allergic to plaquenil [hydroxychloroquine sulfate], azithromycin, lactose intolerance (gi), and penicillin g.   MEDICATIONS:  Current Outpatient Medications  Medication Sig Dispense Refill   losartan-hydrochlorothiazide (HYZAAR) 100-25 MG tablet TAKE 1 TABLET BY MOUTH DAILY 30 tablet 5   Probiotic Product (PROBIOTIC PO) Take 1  capsule by mouth daily.     vitamin B-12 (CYANOCOBALAMIN) 1000 MCG tablet Take 1,000 mcg by mouth daily.     No current facility-administered medications for this visit.    REVIEW OF SYSTEMS:   10 Point review of Systems was done is negative except as noted above.  PHYSICAL EXAMINATION: ECOG PERFORMANCE STATUS: 1 - Symptomatic but completely ambulatory  Vitals:   07/26/20 0955  BP: 133/77  Pulse: 85  Resp: 18  Temp: 99.1 F (37.3 C)  SpO2: 100%    Filed Weights   07/26/20 0955  Weight: 190 lb 7 oz (86.4 kg)    Body mass index is 33.73 kg/m.   NAD GENERAL:alert, in no acute distress and comfortable SKIN: no acute rashes, no significant lesions EYES: conjunctiva are pink and non-injected, sclera anicteric OROPHARYNX: MMM, no exudates, no  oropharyngeal erythema or ulceration NECK: supple, no JVD LYMPH:  no palpable lymphadenopathy in the cervical, axillary or inguinal regions LUNGS: clear to auscultation b/l with normal respiratory effort HEART: regular rate & rhythm ABDOMEN:  normoactive bowel sounds , non tender, not distended. Extremity: no pedal edema PSYCH: alert & oriented x 3 with fluent speech NEURO: no focal motor/sensory deficits   LABORATORY DATA:  I have reviewed the data as listed  CBC Latest Ref Rng & Units 07/26/2020 07/14/2020 07/13/2020  WBC 4.0 - 10.5 K/uL 2.9(L) 9.5 9.4  Hemoglobin 12.0 - 15.0 g/dL 64.3 11.1(L) 11.9(L)  Hematocrit 36.0 - 46.0 % 36.1 34.2(L) 35.4(L)  Platelets 150 - 400 K/uL 157 61(L) 49(L)    CMP Latest Ref Rng & Units 07/26/2020 07/13/2020 07/11/2020  Glucose 70 - 99 mg/dL 329(J) 188(C) 98  BUN 8 - 23 mg/dL 12 16(S) 11  Creatinine 0.44 - 1.00 mg/dL 0.63 0.16 0.10  Sodium 135 - 145 mmol/L 140 133(L) 138  Potassium 3.5 - 5.1 mmol/L 3.6 3.8 3.3(L)  Chloride 98 - 111 mmol/L 104 103 103  CO2 22 - 32 mmol/L 25 23 27   Calcium 8.9 - 10.3 mg/dL 9.2 8.9 9.3  Total Protein 6.5 - 8.1 g/dL 8.0 - 7.5  Total Bilirubin 0.3 - 1.2 mg/dL 0.6 - 0.8  Alkaline Phos 38 - 126 U/L 62 - -  AST 15 - 41 U/L 28 - 23  ALT 0 - 44 U/L 22 - 13   .  RADIOGRAPHIC STUDIES: I have personally reviewed the radiological images as listed and agreed with the findings in the report. No results found.    ASSESSMENT & PLAN:   Elaine Allen is a 65 y.o. female with:  1. Thrombocytopenia - likely related to Immune thrombocytopenia in the setting of lupus. No evidence of TTP  Partly thrombocytopenia was related to previous use of Imuran. Platelet counts seem to have dropped in tandem with decreasing doses of steroids.  2. Lupus - on Imuran  3. H/o AVN - related to steroids s/p left THA.  4. Lupus anticoagulant positive  PLAN: -Discussed pt labwork today, 07/26/2020; hgb wnl, PLT up from 13K progressively  to 157k with steroids and IVIG -okay to proceed with 1st of 4 weekly doses of RItuxan for relapsed ITP in the setting of SLE -leucopenia from Imuran and autoimmune from SLE. -continue to hodl Imuran -Continue OTC Vitamin B12 supplement 1000 mcg  -Continue to f/u w Rheumatologist    FOLLOW UP Plz schedule next 3 cycles of weekly Rituxan with labs -MD visit with C3 of treatment in 2 weeks    The total time spent in the  appointment was 30 minutes and more than 50% was on counseling and direct patient cares.    All of the patient's questions were answered with apparent satisfaction. The patient knows to call the clinic with any problems, questions or concerns.   Wyvonnia Lora MD MS AAHIVMS [facility] Hematology/Oncology Physician [facility]  (Office):       [phone] (Work cell):  [phone] (Fax):           [phone]  07/25/2020 8:39 PM   I, Minda Meo, am acting as scribe for Dr. Wyvonnia Lora, MD.      .I have reviewed the above documentation for accuracy and completeness, and I agree with the above. Johney Maine MD

## 2020-07-26 ENCOUNTER — Inpatient Hospital Stay: Payer: Medicare HMO | Admitting: Hematology

## 2020-07-26 ENCOUNTER — Inpatient Hospital Stay: Payer: Medicare HMO | Attending: Hematology

## 2020-07-26 ENCOUNTER — Other Ambulatory Visit: Payer: Self-pay

## 2020-07-26 ENCOUNTER — Inpatient Hospital Stay: Payer: Medicare HMO

## 2020-07-26 VITALS — BP 133/77 | HR 85 | Temp 99.1°F | Resp 18 | Wt 190.4 lb

## 2020-07-26 VITALS — BP 130/76 | HR 78 | Temp 98.4°F | Resp 17

## 2020-07-26 DIAGNOSIS — Z5112 Encounter for antineoplastic immunotherapy: Secondary | ICD-10-CM | POA: Insufficient documentation

## 2020-07-26 DIAGNOSIS — D6862 Lupus anticoagulant syndrome: Secondary | ICD-10-CM | POA: Diagnosis not present

## 2020-07-26 DIAGNOSIS — Z79899 Other long term (current) drug therapy: Secondary | ICD-10-CM | POA: Diagnosis not present

## 2020-07-26 DIAGNOSIS — D693 Immune thrombocytopenic purpura: Secondary | ICD-10-CM

## 2020-07-26 DIAGNOSIS — I10 Essential (primary) hypertension: Secondary | ICD-10-CM | POA: Insufficient documentation

## 2020-07-26 DIAGNOSIS — D696 Thrombocytopenia, unspecified: Secondary | ICD-10-CM

## 2020-07-26 DIAGNOSIS — Z8673 Personal history of transient ischemic attack (TIA), and cerebral infarction without residual deficits: Secondary | ICD-10-CM | POA: Insufficient documentation

## 2020-07-26 LAB — CMP (CANCER CENTER ONLY)
ALT: 22 U/L (ref 0–44)
AST: 28 U/L (ref 15–41)
Albumin: 3.6 g/dL (ref 3.5–5.0)
Alkaline Phosphatase: 62 U/L (ref 38–126)
Anion gap: 11 (ref 5–15)
BUN: 12 mg/dL (ref 8–23)
CO2: 25 mmol/L (ref 22–32)
Calcium: 9.2 mg/dL (ref 8.9–10.3)
Chloride: 104 mmol/L (ref 98–111)
Creatinine: 0.8 mg/dL (ref 0.44–1.00)
GFR, Estimated: >60 mL/min (ref >60)
Glucose, Bld: 112 mg/dL — ABNORMAL HIGH (ref 70–99)
Potassium: 3.6 mmol/L (ref 3.5–5.1)
Sodium: 140 mmol/L (ref 135–145)
Total Bilirubin: 0.6 mg/dL (ref 0.3–1.2)
Total Protein: 8 g/dL (ref 6.5–8.1)

## 2020-07-26 LAB — RETIC PANEL
Immature Retic Fract: 21.5 % — ABNORMAL HIGH (ref 2.3–15.9)
RBC.: 4 MIL/uL (ref 3.87–5.11)
Retic Count, Absolute: 50.8 K/uL (ref 19.0–186.0)
Retic Ct Pct: 1.3 % (ref 0.4–3.1)
Reticulocyte Hemoglobin: 33.8 pg

## 2020-07-26 LAB — CBC WITH DIFFERENTIAL (CANCER CENTER ONLY)
Abs Immature Granulocytes: 0.01 10*3/uL (ref 0.00–0.07)
Basophils Absolute: 0 10*3/uL (ref 0.0–0.1)
Basophils Relative: 0 %
Eosinophils Absolute: 0.1 10*3/uL (ref 0.0–0.5)
Eosinophils Relative: 2 %
HCT: 36.1 % (ref 36.0–46.0)
Hemoglobin: 12.1 g/dL (ref 12.0–15.0)
Immature Granulocytes: 0 %
Lymphocytes Relative: 33 %
Lymphs Abs: 1 10*3/uL (ref 0.7–4.0)
MCH: 30.3 pg (ref 26.0–34.0)
MCHC: 33.5 g/dL (ref 30.0–36.0)
MCV: 90.5 fL (ref 80.0–100.0)
Monocytes Absolute: 0.5 10*3/uL (ref 0.1–1.0)
Monocytes Relative: 17 %
Neutro Abs: 1.4 10*3/uL — ABNORMAL LOW (ref 1.7–7.7)
Neutrophils Relative %: 48 %
Platelet Count: 157 10*3/uL (ref 150–400)
RBC: 3.99 MIL/uL (ref 3.87–5.11)
RDW: 14.2 % (ref 11.5–15.5)
WBC Count: 2.9 10*3/uL — ABNORMAL LOW (ref 4.0–10.5)
nRBC: 0 % (ref 0.0–0.2)

## 2020-07-26 LAB — IMMATURE PLATELET FRACTION: Immature Platelet Fraction: 3.9 % (ref 1.2–8.6)

## 2020-07-26 MED ORDER — METHYLPREDNISOLONE SODIUM SUCC 125 MG IJ SOLR
80.0000 mg | Freq: Once | INTRAMUSCULAR | Status: AC
  Administered 2020-07-26: 80 mg via INTRAVENOUS

## 2020-07-26 MED ORDER — METHYLPREDNISOLONE SODIUM SUCC 125 MG IJ SOLR
INTRAMUSCULAR | Status: AC
  Filled 2020-07-26: qty 2

## 2020-07-26 MED ORDER — SODIUM CHLORIDE 0.9 % IV SOLN
375.0000 mg/m2 | Freq: Once | INTRAVENOUS | Status: AC
  Administered 2020-07-26: 700 mg via INTRAVENOUS
  Filled 2020-07-26: qty 50

## 2020-07-26 MED ORDER — LORAZEPAM 2 MG/ML IJ SOLN
INTRAMUSCULAR | Status: AC
  Filled 2020-07-26: qty 1

## 2020-07-26 MED ORDER — DIPHENHYDRAMINE HCL 50 MG/ML IJ SOLN
25.0000 mg | Freq: Once | INTRAMUSCULAR | Status: AC
  Administered 2020-07-26: 25 mg via INTRAVENOUS

## 2020-07-26 MED ORDER — MONTELUKAST SODIUM 10 MG PO TABS
10.0000 mg | ORAL_TABLET | Freq: Once | ORAL | Status: AC
  Administered 2020-07-26: 10 mg via ORAL

## 2020-07-26 MED ORDER — SODIUM CHLORIDE 0.9 % IV SOLN
Freq: Once | INTRAVENOUS | Status: AC
  Filled 2020-07-26: qty 250

## 2020-07-26 MED ORDER — ACETAMINOPHEN 325 MG PO TABS
ORAL_TABLET | ORAL | Status: AC
  Filled 2020-07-26: qty 2

## 2020-07-26 MED ORDER — FAMOTIDINE 20 MG IN NS 100 ML IVPB
20.0000 mg | Freq: Once | INTRAVENOUS | Status: AC
  Administered 2020-07-26: 20 mg via INTRAVENOUS

## 2020-07-26 MED ORDER — FAMOTIDINE 20 MG IN NS 100 ML IVPB
20.0000 mg | Freq: Once | INTRAVENOUS | Status: AC | PRN
  Administered 2020-07-26: 20 mg via INTRAVENOUS

## 2020-07-26 MED ORDER — LORAZEPAM 2 MG/ML IJ SOLN
0.5000 mg | Freq: Once | INTRAMUSCULAR | Status: AC | PRN
  Administered 2020-07-26: 0.5 mg via INTRAVENOUS

## 2020-07-26 MED ORDER — SODIUM CHLORIDE 0.9 % IV SOLN
375.0000 mg/m2 | Freq: Once | INTRAVENOUS | Status: DC
  Filled 2020-07-26: qty 70

## 2020-07-26 MED ORDER — DIPHENHYDRAMINE HCL 50 MG/ML IJ SOLN
INTRAMUSCULAR | Status: AC
  Filled 2020-07-26: qty 1

## 2020-07-26 MED ORDER — METHYLPREDNISOLONE SODIUM SUCC 125 MG IJ SOLR
60.0000 mg | Freq: Once | INTRAMUSCULAR | Status: AC
  Administered 2020-07-26: 60 mg via INTRAVENOUS

## 2020-07-26 MED ORDER — FAMOTIDINE 20 MG IN NS 100 ML IVPB
INTRAVENOUS | Status: AC
  Filled 2020-07-26: qty 100

## 2020-07-26 MED ORDER — MONTELUKAST SODIUM 10 MG PO TABS
ORAL_TABLET | ORAL | Status: AC
  Filled 2020-07-26: qty 1

## 2020-07-26 MED ORDER — ACETAMINOPHEN 325 MG PO TABS
650.0000 mg | ORAL_TABLET | Freq: Once | ORAL | Status: AC
  Administered 2020-07-26: 650 mg via ORAL

## 2020-07-26 NOTE — Patient Instructions (Signed)
[facility] MEDICAL ONCOLOGY   Discharge Instructions: Thank you for choosing [facility] to provide your oncology and hematology care.   If you have a lab appointment with the Cancer Center, please go directly to the Cancer Center and check in at the registration area.   Wear comfortable clothing and clothing appropriate for easy access to any Portacath or PICC line.   We strive to give you quality time with your provider. You may need to reschedule your appointment if you arrive late (15 or more minutes).  Arriving late affects you and other patients whose appointments are after yours.  Also, if you miss three or more appointments without notifying the office, you may be dismissed from the clinic at the provider's discretion.      For prescription refill requests, have your pharmacy contact our office and allow 72 hours for refills to be completed.    Today you received the following chemotherapy and/or immunotherapy agents: rituximab.      To help prevent nausea and vomiting after your treatment, we encourage you to take your nausea medication as directed.  BELOW ARE SYMPTOMS THAT SHOULD BE REPORTED IMMEDIATELY: *FEVER GREATER THAN 100.4 F (38 C) OR HIGHER *CHILLS OR SWEATING *NAUSEA AND VOMITING THAT IS NOT CONTROLLED WITH YOUR NAUSEA MEDICATION *UNUSUAL SHORTNESS OF BREATH *UNUSUAL BRUISING OR BLEEDING *URINARY PROBLEMS (pain or burning when urinating, or frequent urination) *BOWEL PROBLEMS (unusual diarrhea, constipation, pain near the anus) TENDERNESS IN MOUTH AND THROAT WITH OR WITHOUT PRESENCE OF ULCERS (sore throat, sores in mouth, or a toothache) UNUSUAL RASH, SWELLING OR PAIN  UNUSUAL VAGINAL DISCHARGE OR ITCHING   Items with * indicate a potential emergency and should be followed up as soon as possible or go to the Emergency Department if any problems should occur.  Please show the CHEMOTHERAPY ALERT CARD or IMMUNOTHERAPY ALERT CARD at check-in  to the Emergency Department and triage nurse.  Should you have questions after your visit or need to cancel or reschedule your appointment, please contact [facility] MEDICAL ONCOLOG[facility]  Dept: [phone]  and follow the prompts.  Office hours are 8:00 a.m. to 4:30 p.m. Monday - Friday. Please note that voicemails left after 4:00 p.m. may not be returned until the following business day.  We are closed weekends and major holidays. You have access to a nurse at all times for urgent questions. Please call the main number to the clinic Dept: [phone] and follow the prompts.   For any non-urgent questions, you may also contact your provider using MyChart. We now offer e-Visits for anyone 18 and older to request care online for non-urgent symptoms. For details visit [email or web address].   Also download the MyChart app! Go to the app store, search "MyChart", open the app, select [place], and log in with your MyChart username and password.  Due to Covid, a mask is required upon entering the hospital/clinic. If you do not have a mask, one will be given to you upon arrival. For doctor visits, patients may have 1 support person aged 18 or older with them. For treatment visits, patients cannot have anyone with them due to current Covid guidelines and our immunocompromised population.   

## 2020-07-29 NOTE — Progress Notes (Signed)
LATE ENTRY:  At approximately 1505 patient began c/o a scratchy throat.  Rituximab stopped and IVF started at 973ml/hr.  VSS.  Appropriate hypersensitivity medications given.  Dr. Candise Che made aware and orders received to restart at 1525 if patient is feeling relief from hypersensitivity reaction.  Rituximab infusion restarted at 1525 with rate of 141ml/hr for 30 minutes.  Patient did not experience any other hypersensitivity reactions for remainder of infusion.  VS remained stable throughout infusion.  Discharged home with no other s/s or c/o distress or discomfort.

## 2020-08-01 ENCOUNTER — Encounter: Payer: Self-pay | Admitting: Hematology

## 2020-08-02 ENCOUNTER — Telehealth: Payer: Self-pay | Admitting: Hematology

## 2020-08-02 NOTE — Telephone Encounter (Signed)
Scheduled appts per 7/28 sch msg. Pt aware.  

## 2020-08-05 ENCOUNTER — Other Ambulatory Visit: Payer: Self-pay | Admitting: Hematology

## 2020-08-05 ENCOUNTER — Other Ambulatory Visit: Payer: Self-pay

## 2020-08-05 DIAGNOSIS — D693 Immune thrombocytopenic purpura: Secondary | ICD-10-CM

## 2020-08-06 ENCOUNTER — Inpatient Hospital Stay: Payer: Medicare HMO

## 2020-08-06 ENCOUNTER — Other Ambulatory Visit: Payer: Self-pay

## 2020-08-06 ENCOUNTER — Inpatient Hospital Stay: Payer: Medicare HMO | Attending: Hematology

## 2020-08-06 VITALS — BP 134/83 | HR 99 | Temp 97.8°F | Resp 18

## 2020-08-06 DIAGNOSIS — H5461 Unqualified visual loss, right eye, normal vision left eye: Secondary | ICD-10-CM | POA: Insufficient documentation

## 2020-08-06 DIAGNOSIS — Z8673 Personal history of transient ischemic attack (TIA), and cerebral infarction without residual deficits: Secondary | ICD-10-CM | POA: Insufficient documentation

## 2020-08-06 DIAGNOSIS — E876 Hypokalemia: Secondary | ICD-10-CM | POA: Diagnosis not present

## 2020-08-06 DIAGNOSIS — Z5112 Encounter for antineoplastic immunotherapy: Secondary | ICD-10-CM | POA: Insufficient documentation

## 2020-08-06 DIAGNOSIS — I10 Essential (primary) hypertension: Secondary | ICD-10-CM | POA: Insufficient documentation

## 2020-08-06 DIAGNOSIS — D693 Immune thrombocytopenic purpura: Secondary | ICD-10-CM | POA: Insufficient documentation

## 2020-08-06 DIAGNOSIS — Z79899 Other long term (current) drug therapy: Secondary | ICD-10-CM | POA: Diagnosis not present

## 2020-08-06 LAB — CBC WITH DIFFERENTIAL (CANCER CENTER ONLY)
Abs Immature Granulocytes: 0.01 10*3/uL (ref 0.00–0.07)
Basophils Absolute: 0 10*3/uL (ref 0.0–0.1)
Basophils Relative: 0 %
Eosinophils Absolute: 0.1 10*3/uL (ref 0.0–0.5)
Eosinophils Relative: 2 %
HCT: 35.4 % — ABNORMAL LOW (ref 36.0–46.0)
Hemoglobin: 11.8 g/dL — ABNORMAL LOW (ref 12.0–15.0)
Immature Granulocytes: 0 %
Lymphocytes Relative: 62 %
Lymphs Abs: 2.6 10*3/uL (ref 0.7–4.0)
MCH: 30.3 pg (ref 26.0–34.0)
MCHC: 33.3 g/dL (ref 30.0–36.0)
MCV: 91 fL (ref 80.0–100.0)
Monocytes Absolute: 0.6 10*3/uL (ref 0.1–1.0)
Monocytes Relative: 14 %
Neutro Abs: 0.9 10*3/uL — ABNORMAL LOW (ref 1.7–7.7)
Neutrophils Relative %: 22 %
Platelet Count: 184 10*3/uL (ref 150–400)
RBC: 3.89 MIL/uL (ref 3.87–5.11)
RDW: 14.3 % (ref 11.5–15.5)
WBC Count: 4.3 10*3/uL (ref 4.0–10.5)
nRBC: 0 % (ref 0.0–0.2)

## 2020-08-06 LAB — CMP (CANCER CENTER ONLY)
ALT: 15 U/L (ref 0–44)
AST: 20 U/L (ref 15–41)
Albumin: 3.6 g/dL (ref 3.5–5.0)
Alkaline Phosphatase: 63 U/L (ref 38–126)
Anion gap: 7 (ref 5–15)
BUN: 17 mg/dL (ref 8–23)
CO2: 30 mmol/L (ref 22–32)
Calcium: 9.4 mg/dL (ref 8.9–10.3)
Chloride: 105 mmol/L (ref 98–111)
Creatinine: 0.83 mg/dL (ref 0.44–1.00)
GFR, Estimated: >60 mL/min (ref >60)
Glucose, Bld: 112 mg/dL — ABNORMAL HIGH (ref 70–99)
Potassium: 3.6 mmol/L (ref 3.5–5.1)
Sodium: 142 mmol/L (ref 135–145)
Total Bilirubin: 0.6 mg/dL (ref 0.3–1.2)
Total Protein: 7.3 g/dL (ref 6.5–8.1)

## 2020-08-06 MED ORDER — DIPHENHYDRAMINE HCL 50 MG/ML IJ SOLN
INTRAMUSCULAR | Status: AC
  Filled 2020-08-06: qty 1

## 2020-08-06 MED ORDER — FAMOTIDINE 20 MG IN NS 100 ML IVPB
20.0000 mg | Freq: Two times a day (BID) | INTRAVENOUS | Status: DC
  Administered 2020-08-06: 20 mg via INTRAVENOUS
  Filled 2020-08-06: qty 100

## 2020-08-06 MED ORDER — METHYLPREDNISOLONE SODIUM SUCC 125 MG IJ SOLR
INTRAMUSCULAR | Status: AC
  Filled 2020-08-06: qty 2

## 2020-08-06 MED ORDER — LORAZEPAM 2 MG/ML IJ SOLN
0.5000 mg | Freq: Once | INTRAMUSCULAR | Status: AC | PRN
  Administered 2020-08-06: 0.5 mg via INTRAVENOUS

## 2020-08-06 MED ORDER — MONTELUKAST SODIUM 10 MG PO TABS
10.0000 mg | ORAL_TABLET | Freq: Once | ORAL | Status: AC
  Administered 2020-08-06: 10 mg via ORAL
  Filled 2020-08-06: qty 1

## 2020-08-06 MED ORDER — DIPHENHYDRAMINE HCL 50 MG/ML IJ SOLN
25.0000 mg | Freq: Once | INTRAMUSCULAR | Status: AC
  Administered 2020-08-06: 25 mg via INTRAVENOUS

## 2020-08-06 MED ORDER — SODIUM CHLORIDE 0.9 % IV SOLN: 375.0000 mg/m2 | Freq: Once | INTRAVENOUS | Status: DC

## 2020-08-06 MED ORDER — METHYLPREDNISOLONE SODIUM SUCC 125 MG IJ SOLR
80.0000 mg | Freq: Once | INTRAMUSCULAR | Status: AC
  Administered 2020-08-06: 80 mg via INTRAVENOUS

## 2020-08-06 MED ORDER — SODIUM CHLORIDE 0.9 % IV SOLN
375.0000 mg/m2 | Freq: Once | INTRAVENOUS | Status: AC
  Administered 2020-08-06: 700 mg via INTRAVENOUS
  Filled 2020-08-06: qty 50

## 2020-08-06 MED ORDER — ACETAMINOPHEN 325 MG PO TABS
650.0000 mg | ORAL_TABLET | Freq: Once | ORAL | Status: AC
  Administered 2020-08-06: 650 mg via ORAL

## 2020-08-06 MED ORDER — ACETAMINOPHEN 325 MG PO TABS
ORAL_TABLET | ORAL | Status: AC
  Filled 2020-08-06: qty 2

## 2020-08-06 MED ORDER — LORAZEPAM 2 MG/ML IJ SOLN
INTRAMUSCULAR | Status: AC
  Filled 2020-08-06: qty 1

## 2020-08-06 MED ORDER — SODIUM CHLORIDE 0.9 % IV SOLN
Freq: Once | INTRAVENOUS | Status: AC
  Filled 2020-08-06: qty 250

## 2020-08-06 NOTE — Patient Instructions (Signed)
Rituximab Injection What is this medication? RITUXIMAB (ri TUX i mab) is a monoclonal antibody. It is used to treat certain types of cancer like non-Hodgkin lymphoma and chronic lymphocytic leukemia. It is also used to treat rheumatoid arthritis, granulomatosis with polyangiitis,microscopic polyangiitis, and pemphigus vulgaris. This medicine may be used for other purposes; ask your health care provider orpharmacist if you have questions. COMMON BRAND NAME(S): RIABNI, Rituxan, RUXIENCE What should I tell my care team before I take this medication? They need to know if you have any of these conditions: chest pain heart disease infection especially a viral infection such as chickenpox, cold sores, hepatitis B, or herpes immune system problems irregular heartbeat or rhythm kidney disease low blood counts (white cells, platelets, or red cells) lung disease recent or upcoming vaccine an unusual or allergic reaction to rituximab, other medicines, foods, dyes, or preservatives pregnant or trying to get pregnant breast-feeding How should I use this medication? This medicine is injected into a vein. It is given by a health care provider ina hospital or clinic setting. A special MedGuide will be given to you before each treatment. Be sure to readthis information carefully each time. Talk to your health care provider about the use of this medicine in children. While this drug may be prescribed for children as young as 6 months forselected conditions, precautions do apply. Overdosage: If you think you have taken too much of this medicine contact apoison control center or emergency room at once. NOTE: This medicine is only for you. Do not share this medicine with others. What if I miss a dose? Keep appointments for follow-up doses. It is important not to miss your dose.Call your health care provider if you are unable to keep an appointment. What may interact with this medication? Do not take this  medicine with any of the following medicines: live vaccines This medicine may also interact with the following medicines: cisplatin This list may not describe all possible interactions. Give your health care provider a list of all the medicines, herbs, non-prescription drugs, or dietary supplements you use. Also tell them if you smoke, drink alcohol, or use illegaldrugs. Some items may interact with your medicine. What should I watch for while using this medication? Your condition will be monitored carefully while you are receiving thismedicine. You may need blood work done while you are taking this medicine. This medicine can cause serious infusion reactions. To reduce the risk your health care provider may give you other medicines to take before receiving thisone. Be sure to follow the directions from your health care provider. This medicine may increase your risk of getting an infection. Call your health care provider for advice if you get a fever, chills, sore throat, or other symptoms of a cold or flu. Do not treat yourself. Try to avoid being aroundpeople who are sick. Call your health care provider if you are around anyone with measles,chickenpox, or if you develop sores or blisters that do not heal properly. Avoid taking medicines that contain aspirin, acetaminophen, ibuprofen, naproxen, or ketoprofen unless instructed by your health care provider. Thesemedicines may hide a fever. This medicine may cause serious skin reactions. They can happen weeks to months after starting the medicine. Contact your health care provider right away if you notice fevers or flu-like symptoms with a rash. The rash may be red or purple and then turn into blisters or peeling of the skin. Or, you might notice a red rash with swelling of the face, lips or lymph nodes   in your neck or underyour arms. In some patients, this medicine may cause a serious brain infection that may cause death. If you have any problems seeing,  thinking, speaking, walking, or standing, tell your healthcare professional right away. If you cannot reachyour healthcare professional, urgently seek other source of medical care. Do not become pregnant while taking this medicine or for at least 12 months after stopping it. Women should inform their health care provider if they wish to become pregnant or think they might be pregnant. There is potential for serious harm to an unborn child. Talk to your health care provider for more information. Women should use a reliable form of birth control while taking this medicine and for 12 months after stopping it. Do not breast-feed whiletaking this medicine or for at least 6 months after stopping it. What side effects may I notice from receiving this medication? Side effects that you should report to your health care provider as soon aspossible: allergic reactions (skin rash, itching or hives; swelling of the face, lips, or tongue) diarrhea edema (sudden weight gain; swelling of the ankles, feet, hands or other unusual swelling; trouble breathing) fast, irregular heartbeat heart attack (trouble breathing; pain or tightness in the chest, neck, back or arms; unusually weak or tired) infection (fever, chills, cough, sore throat, pain or trouble passing urine) kidney injury (trouble passing urine or change in the amount of urine) liver injury (dark yellow or brown urine; general ill feeling or flu-like symptoms; loss of appetite, right upper belly pain; unusually weak or tired, yellowing of the eyes or skin) low blood pressure (dizziness; feeling faint or lightheaded, falls; unusually weak or tired) low red blood cell counts (trouble breathing; feeling faint; lightheaded, falls; unusually weak or tired) mouth sores redness, blistering, peeling, or loosening of the skin, including inside the mouth stomach pain unusual bruising or bleeding wheezing (trouble breathing with loud or whistling  sounds) vomiting Side effects that usually do not require medical attention (report to yourhealth care provider if they continue or are bothersome): headache joint pain muscle cramps, pain nausea This list may not describe all possible side effects. Call your doctor for medical advice about side effects. You may report side effects to FDA at1-800-FDA-1088. Where should I keep my medication? This medicine is given in a hospital or clinic. It will not be stored at home. NOTE: This sheet is a summary. It may not cover all possible information. If you have questions about this medicine, talk to your doctor, pharmacist, orhealth care provider.  2022 Elsevier/Gold Standard (2019-12-14 15:47:26)  

## 2020-08-09 ENCOUNTER — Other Ambulatory Visit: Payer: Self-pay

## 2020-08-09 DIAGNOSIS — D693 Immune thrombocytopenic purpura: Secondary | ICD-10-CM

## 2020-08-12 ENCOUNTER — Inpatient Hospital Stay: Payer: Medicare HMO

## 2020-08-12 ENCOUNTER — Inpatient Hospital Stay: Payer: Medicare HMO | Admitting: Physician Assistant

## 2020-08-12 ENCOUNTER — Other Ambulatory Visit: Payer: Self-pay

## 2020-08-12 ENCOUNTER — Other Ambulatory Visit: Payer: Self-pay | Admitting: Hematology and Oncology

## 2020-08-12 ENCOUNTER — Telehealth: Payer: Self-pay | Admitting: Hematology

## 2020-08-12 ENCOUNTER — Inpatient Hospital Stay: Payer: Medicare HMO | Admitting: Hematology

## 2020-08-12 VITALS — BP 125/77 | HR 89 | Temp 99.2°F | Resp 18 | Wt 197.2 lb

## 2020-08-12 VITALS — BP 138/77 | HR 70 | Temp 98.1°F | Resp 18

## 2020-08-12 DIAGNOSIS — H5461 Unqualified visual loss, right eye, normal vision left eye: Secondary | ICD-10-CM | POA: Diagnosis not present

## 2020-08-12 DIAGNOSIS — D693 Immune thrombocytopenic purpura: Secondary | ICD-10-CM | POA: Diagnosis not present

## 2020-08-12 DIAGNOSIS — I10 Essential (primary) hypertension: Secondary | ICD-10-CM | POA: Diagnosis not present

## 2020-08-12 DIAGNOSIS — Z79899 Other long term (current) drug therapy: Secondary | ICD-10-CM | POA: Diagnosis not present

## 2020-08-12 DIAGNOSIS — Z8673 Personal history of transient ischemic attack (TIA), and cerebral infarction without residual deficits: Secondary | ICD-10-CM | POA: Diagnosis not present

## 2020-08-12 DIAGNOSIS — Z5112 Encounter for antineoplastic immunotherapy: Secondary | ICD-10-CM | POA: Diagnosis not present

## 2020-08-12 DIAGNOSIS — D696 Thrombocytopenia, unspecified: Secondary | ICD-10-CM | POA: Diagnosis not present

## 2020-08-12 DIAGNOSIS — E876 Hypokalemia: Secondary | ICD-10-CM | POA: Diagnosis not present

## 2020-08-12 LAB — CMP (CANCER CENTER ONLY)
ALT: 21 U/L (ref 0–44)
AST: 22 U/L (ref 15–41)
Albumin: 3.3 g/dL — ABNORMAL LOW (ref 3.5–5.0)
Alkaline Phosphatase: 74 U/L (ref 38–126)
Anion gap: 9 (ref 5–15)
BUN: 12 mg/dL (ref 8–23)
CO2: 25 mmol/L (ref 22–32)
Calcium: 9.3 mg/dL (ref 8.9–10.3)
Chloride: 108 mmol/L (ref 98–111)
Creatinine: 0.97 mg/dL (ref 0.44–1.00)
GFR, Estimated: >60 mL/min (ref >60)
Glucose, Bld: 107 mg/dL — ABNORMAL HIGH (ref 70–99)
Potassium: 3.5 mmol/L (ref 3.5–5.1)
Sodium: 142 mmol/L (ref 135–145)
Total Bilirubin: 0.6 mg/dL (ref 0.3–1.2)
Total Protein: 7.1 g/dL (ref 6.5–8.1)

## 2020-08-12 LAB — CBC WITH DIFFERENTIAL (CANCER CENTER ONLY)
Abs Immature Granulocytes: 0.01 10*3/uL (ref 0.00–0.07)
Basophils Absolute: 0 10*3/uL (ref 0.0–0.1)
Basophils Relative: 0 %
Eosinophils Absolute: 0 10*3/uL (ref 0.0–0.5)
Eosinophils Relative: 1 %
HCT: 35.6 % — ABNORMAL LOW (ref 36.0–46.0)
Hemoglobin: 11.8 g/dL — ABNORMAL LOW (ref 12.0–15.0)
Immature Granulocytes: 0 %
Lymphocytes Relative: 61 %
Lymphs Abs: 2.2 10*3/uL (ref 0.7–4.0)
MCH: 30 pg (ref 26.0–34.0)
MCHC: 33.1 g/dL (ref 30.0–36.0)
MCV: 90.6 fL (ref 80.0–100.0)
Monocytes Absolute: 0.5 10*3/uL (ref 0.1–1.0)
Monocytes Relative: 14 %
Neutro Abs: 0.9 10*3/uL — ABNORMAL LOW (ref 1.7–7.7)
Neutrophils Relative %: 24 %
Platelet Count: 201 10*3/uL (ref 150–400)
RBC: 3.93 MIL/uL (ref 3.87–5.11)
RDW: 14.8 % (ref 11.5–15.5)
WBC Count: 3.7 10*3/uL — ABNORMAL LOW (ref 4.0–10.5)
nRBC: 0 % (ref 0.0–0.2)

## 2020-08-12 MED ORDER — SODIUM CHLORIDE 0.9 % IV SOLN
375.0000 mg/m2 | Freq: Once | INTRAVENOUS | Status: AC
  Administered 2020-08-12: 700 mg via INTRAVENOUS
  Filled 2020-08-12: qty 50

## 2020-08-12 MED ORDER — ACETAMINOPHEN 325 MG PO TABS
ORAL_TABLET | ORAL | Status: AC
  Filled 2020-08-12: qty 2

## 2020-08-12 MED ORDER — DIPHENHYDRAMINE HCL 50 MG/ML IJ SOLN
INTRAMUSCULAR | Status: AC
  Filled 2020-08-12: qty 1

## 2020-08-12 MED ORDER — DIPHENHYDRAMINE HCL 50 MG/ML IJ SOLN
25.0000 mg | Freq: Once | INTRAMUSCULAR | Status: AC
  Administered 2020-08-12: 25 mg via INTRAVENOUS

## 2020-08-12 MED ORDER — METHYLPREDNISOLONE SODIUM SUCC 125 MG IJ SOLR
INTRAMUSCULAR | Status: AC
  Filled 2020-08-12: qty 2

## 2020-08-12 MED ORDER — FAMOTIDINE 20 MG IN NS 100 ML IVPB
INTRAVENOUS | Status: AC
  Filled 2020-08-12: qty 100

## 2020-08-12 MED ORDER — FAMOTIDINE 20 MG IN NS 100 ML IVPB
20.0000 mg | Freq: Once | INTRAVENOUS | Status: AC
  Administered 2020-08-12: 20 mg via INTRAVENOUS

## 2020-08-12 MED ORDER — LORAZEPAM 2 MG/ML IJ SOLN
INTRAMUSCULAR | Status: AC
  Filled 2020-08-12: qty 1

## 2020-08-12 MED ORDER — MONTELUKAST SODIUM 10 MG PO TABS
10.0000 mg | ORAL_TABLET | Freq: Once | ORAL | Status: AC
  Administered 2020-08-12: 10 mg via ORAL

## 2020-08-12 MED ORDER — SODIUM CHLORIDE 0.9 % IV SOLN
Freq: Once | INTRAVENOUS | Status: AC
  Filled 2020-08-12: qty 250

## 2020-08-12 MED ORDER — ACETAMINOPHEN 325 MG PO TABS
650.0000 mg | ORAL_TABLET | Freq: Once | ORAL | Status: AC
  Administered 2020-08-12: 650 mg via ORAL

## 2020-08-12 MED ORDER — LORAZEPAM 2 MG/ML IJ SOLN
0.5000 mg | Freq: Once | INTRAMUSCULAR | Status: AC | PRN
  Administered 2020-08-12: 0.5 mg via INTRAVENOUS

## 2020-08-12 MED ORDER — METHYLPREDNISOLONE SODIUM SUCC 125 MG IJ SOLR
80.0000 mg | Freq: Once | INTRAMUSCULAR | Status: AC
  Administered 2020-08-12: 80 mg via INTRAVENOUS

## 2020-08-12 MED ORDER — MONTELUKAST SODIUM 10 MG PO TABS
ORAL_TABLET | ORAL | Status: AC
  Filled 2020-08-12: qty 1

## 2020-08-12 NOTE — Progress Notes (Signed)
Per PA Thayil ok to treat with ANC 0.9

## 2020-08-12 NOTE — Telephone Encounter (Signed)
Rescheduled today's appointment due to provider's emergency. Patient is aware of changes. 

## 2020-08-12 NOTE — Patient Instructions (Signed)
[facility]ICAL [facility]COLOGY  Discharge Instructions: Thank you for choosing [facility] to provide your oncology and hematology care.   If you have a lab appointment with the Cancer Center, please go directly to the Cancer Center and check in at the registration area.   Wear comfortable clothing and clothing appropriate for easy access to any Portacath or PICC line.   We strive to give you quality time with your provider. You may need to reschedule your appointment if you arrive late (15 or more minutes).  Arriving late affects you and other patients whose appointments are after yours.  Also, if you miss three or more appointments without notifying the office, you may be dismissed from the clinic at the provider's discretion.      For prescription refill requests, have your pharmacy contact our office and allow 72 hours for refills to be completed.    Today you received the following chemotherapy and/or immunotherapy agents Rituxan      To help prevent nausea and vomiting after your treatment, we encourage you to take your nausea medication as directed.  BELOW ARE SYMPTOMS THAT SHOULD BE REPORTED IMMEDIATELY: *FEVER GREATER THAN 100.4 F (38 C) OR HIGHER *CHILLS OR SWEATING *NAUSEA AND VOMITING THAT IS NOT CONTROLLED WITH YOUR NAUSEA MEDICATION *UNUSUAL SHORTNESS OF BREATH *UNUSUAL BRUISING OR BLEEDING *URINARY PROBLEMS (pain or burning when urinating, or frequent urination) *BOWEL PROBLEMS (unusual diarrhea, constipation, pain near the anus) TENDERNESS IN MOUTH AND THROAT WITH OR WITHOUT PRESENCE OF ULCERS (sore throat, sores in mouth, or a toothache) UNUSUAL RASH, SWELLING OR PAIN  UNUSUAL VAGINAL DISCHARGE OR ITCHING   Items with * indicate a potential emergency and should be followed up as soon as possible or go to the Emergency Department if any problems should occur.  Please show the CHEMOTHERAPY ALERT CARD or IMMUNOTHERAPY ALERT CARD at check-in to the  Emergency Department and triage nurse.  Should you have questions after your visit or need to cancel or reschedule your appointment, please contact [facility]COL[facility]  Dept: [phone]  and follow the prompts.  Office hours are 8:00 a.m. to 4:30 p.m. Monday - Friday. Please note that voicemails left after 4:00 p.m. may not be returned until the following business day.  We are closed weekends and major holidays. You have access to a nurse at all times for urgent questions. Please call the main number to the clinic Dept: [phone] and follow the prompts.   For any non-urgent questions, you may also contact your provider using MyChart. We now offer e-Visits for anyone 18 and older to request care online for non-urgent symptoms. For details visit [email or web address].   Also download the MyChart app! Go to the app store, search "MyChart", open the app, select , and log in with your MyChart username and password.  Due to Covid, a mask is required upon entering the hospital/clinic. If you do not have a mask, one will be given to you upon arrival. For doctor visits, patients may have 1 support person aged 67 or older with them. For treatment visits, patients cannot have anyone with them due to current Covid guidelines and our immunocompromised population.   

## 2020-08-12 NOTE — Progress Notes (Signed)
HEMATOLOGY/ONCOLOGY CLINIC NOTE  Date of Service: 08/12/2020  Patient Care Team: Myrlene Broker, MD as PCP - General (Internal Medicine)  REFERRING PHYSICIAN: Myrlene Broker, *  CHIEF COMPLAINTS/PURPOSE OF CONSULTATION:  F/u for mx of ITP   INTERVAL HISTORY  Elaine Allen is a wonderful 65 y.o. female who is here today for a follow up for ITP prior to her third weekly Rituximab infusion. She reports that her energy levels are overall stable. She has a good appetite. She denies any nausea, vomiting or abdominal pain. Her bowel movements are regular without diarrhea or constipation. Patient continues to have diffuse bodyaches and joint pain from SLE. She denies easy bruising or signs of bleeding. Patient denies any fevers, chills, night sweats, shortness of breath, chest pain or cough. She has no other complaints.   MEDICAL HISTORY:  Past Medical History:  Diagnosis Date   Arthritis    Blind right eye    sees colors   Chronic anemia    Detached retina, right    corrected good for about 6 months sees peripheral colors only   History of kidney stones    Hypertension    Hyperthyroidism    Lupus (HCC)    Pneumonia    Retinal detachment    Spasmodic dysphonia    Stroke (HCC) 2015   no permanant defecits had weakness in left arm in hospital Had PT also   Vertigo    Hx of preceding stroke     SURGICAL HISTORY: Past Surgical History:  Procedure Laterality Date   ABDOMINAL HYSTERECTOMY     BREAST LUMPECTOMY     left breast benign   KNEE SURGERY Left    THYROID SURGERY     TOTAL HIP ARTHROPLASTY Left 06/27/2018   Procedure: TOTAL HIP ARTHROPLASTY ANTERIOR APPROACH;  Surgeon: Gean Birchwood, MD;  Location: [facility];  Service: Orthopedics;  Laterality: Left;     SOCIAL HISTORY: Social History   Socioeconomic History   Marital status: Single    Spouse name: Not on file   Number of children: 2   Years of education: college   Highest education level: Not  on file  Occupational History   Occupation: [employer]  Tobacco Use   Smoking status: Never   Smokeless tobacco: Never  Vaping Use   Vaping Use: Never used  Substance and Sexual Activity   Alcohol use: No   Drug use: No   Sexual activity: Not Currently  Other Topics Concern   Not on file  Social History Narrative   Not on file   Social Determinants of Health   Financial Resource Strain: Not on file  Food Insecurity: Not on file  Transportation Needs: Not on file  Physical Activity: Not on file  Stress: Not on file  Social Connections: Not on file  Intimate Partner Violence: Not on file     FAMILY HISTORY: Family History  Problem Relation Age of Onset   Cancer Mother        liver   Diabetes Mother    Arthritis Mother    Cancer Father        prostate,METS, stomach    Ovarian cancer Sister    Throat cancer Brother      ALLERGIES:   is allergic to plaquenil [hydroxychloroquine sulfate], azithromycin, lactose intolerance (gi), and penicillin g.   MEDICATIONS:  Current Outpatient Medications  Medication Sig Dispense Refill   losartan-hydrochlorothiazide (HYZAAR) 100-25 MG tablet TAKE 1 TABLET BY MOUTH DAILY 30 tablet 5  Probiotic Product (PROBIOTIC PO) Take 1 capsule by mouth daily.     vitamin B-12 (CYANOCOBALAMIN) 1000 MCG tablet Take 1,000 mcg by mouth daily.     No current facility-administered medications for this visit.    REVIEW OF SYSTEMS:   10 Point review of Systems was done is negative except as noted above.  PHYSICAL EXAMINATION: ECOG PERFORMANCE STATUS: 1 - Symptomatic but completely ambulatory  Vitals:   08/12/20 1225  BP: 125/77  Pulse: 89  Resp: 18  Temp: 99.2 F (37.3 C)  SpO2: 100%    Filed Weights   08/12/20 1225  Weight: 197 lb 3.2 oz (89.4 kg)    Body mass index is 34.93 kg/m.   NAD GENERAL:alert, in no acute distress and comfortable SKIN: no acute rashes, no significant lesions EYES: conjunctiva are pink and  non-injected, sclera anicteric OROPHARYNX: MMM, no exudates, no oropharyngeal erythema or ulceration NECK: supple, no JVD LYMPH:  no palpable lymphadenopathy in the cervical, axillary or inguinal regions LUNGS: clear to auscultation b/l with normal respiratory effort HEART: regular rate & rhythm ABDOMEN:  normoactive bowel sounds , non tender, not distended. Extremity: no pedal edema PSYCH: alert & oriented x 3 with fluent speech NEURO: no focal motor/sensory deficits   LABORATORY DATA:  I have reviewed the data as listed  CBC Latest Ref Rng & Units 08/12/2020 08/06/2020 07/26/2020  WBC 4.0 - 10.5 K/uL 3.7(L) 4.3 2.9(L)  Hemoglobin 12.0 - 15.0 g/dL 11.8(L) 11.8(L) 12.1  Hematocrit 36.0 - 46.0 % 35.6(L) 35.4(L) 36.1  Platelets 150 - 400 K/uL 201 184 157    CMP Latest Ref Rng & Units 08/12/2020 08/06/2020 07/26/2020  Glucose 70 - 99 mg/dL 950(D) 326(Z) 124(P)  BUN 8 - 23 mg/dL 12 17 12   Creatinine 0.44 - 1.00 mg/dL 8.09 9.83  Sodium 135 - 145 mmol/L 142 142 140  Potassium 3.5 - 5.1 mmol/L 3.5 3.6 3.6  Chloride 98 - 111 mmol/L 108 105 104  CO2 22 - 32 mmol/L 25 30 25   Calcium 8.9 - 10.3 mg/dL 9.3 9.4 9.2  Total Protein 6.5 - 8.1 g/dL 7.1 7.3 8.0  Total Bilirubin 0.3 - 1.2 mg/dL 0.6 0.6 0.6  Alkaline Phos 38 - 126 U/L 74 63 62  AST 15 - 41 U/L 22 20 28   ALT 0 - 44 U/L 21 15 22    .  RADIOGRAPHIC STUDIES: I have personally reviewed the radiological images as listed and agreed with the findings in the report. No results found.    ASSESSMENT & PLAN:   [name] is a 65 y.o. female with:  1. Thrombocytopenia- -Likely related to Immune thrombocytopenia in the setting of lupus and previous use of Imuran. -Labs from today show platelet count has improved to 201K.  -Patient will proceed with 3rd (out of total 4) infusion of Rituximab today.   2. Lupus - Imuran is currently on hold since hospitalization from 07/14/2020 as it was felt to contributing to thrombocytopenia.  Patient is reporting diffuse joint pain and body aches today since medication has been on hold. Patient is scheduled for a follow up with Dr. on 08/28/2020.   3. H/o AVN -  related to steroids s/p left THA.  4. Lupus anticoagulant positive   FOLLOW UP -RTC on 08/19/2020 prior to 4th weekly dose of Rituximab.    All of the patient's questions were answered with apparent satisfaction. The patient knows to call the clinic with any problems, questions or concerns.   I  have spent a total of 25 minutes  minutes of face-to-face and non-face-to-face time, preparing to see the patient, performing a medically appropriate examination, counseling and educating the patient,  documenting clinical information in the electronic health record, and care coordination.   Briant Cedar, PA-C Hematology and Oncology [facility]

## 2020-08-13 ENCOUNTER — Other Ambulatory Visit: Payer: Self-pay | Admitting: Hematology

## 2020-08-13 ENCOUNTER — Ambulatory Visit (INDEPENDENT_AMBULATORY_CARE_PROVIDER_SITE_OTHER): Payer: Medicare HMO

## 2020-08-13 VITALS — BP 128/80 | HR 79 | Temp 97.6°F | Resp 16 | Ht 63.0 in | Wt 200.0 lb

## 2020-08-13 DIAGNOSIS — Z0000 Encounter for general adult medical examination without abnormal findings: Secondary | ICD-10-CM

## 2020-08-13 NOTE — Patient Instructions (Signed)
Elaine Allen , Thank you for taking time to come for your Medicare Wellness Visit. I appreciate your ongoing commitment to your health goals. Please review the following plan we discussed and let me know if I can assist you in the future.   Screening recommendations/referrals: Colonoscopy: last done 01/16/2015; due every 10 years Mammogram: last done 05/19/2019; due once a year Bone Density: last done 03/15/2020; results: osteopenia; due every 2 years Recommended yearly ophthalmology/optometry visit for glaucoma screening and checkup Recommended yearly dental visit for hygiene and checkup  Vaccinations: Influenza vaccine: due Fall 2022 Pneumococcal vaccine: 10/21/2018, 03/14/2020 Tdap vaccine: 12/05/2014; due every 10 years Shingles vaccine: 06/24/2020; second dose needed  in September 2022 Covid-19: 06/06/2019, 07/06/2019  Advanced directives: Advance directive discussed with you today. Even though you declined this today please call our office should you change your mind and we can give you the proper paperwork for you to fill out.   Conditions/risks identified: Yes; Client understands the importance of follow-up with providers by attending scheduled visits and discussed goals to eat healthier, increase physical activity, exercise the brain, socialize more, get enough sleep and make time for laughter.  Next appointment: Please schedule your next Medicare Wellness Visit with your Nurse Health Advisor in 1 year by calling [phone].   Preventive Care 35 Years and Older, Female Preventive care refers to lifestyle choices and visits with your health care provider that can promote health and wellness. What does preventive care include? A yearly physical exam. This is also called an annual well check. Dental exams once or twice a year. Routine eye exams. Ask your health care provider how often you should have your eyes checked. Personal lifestyle choices, including: Daily care of your teeth and  gums. Regular physical activity. Eating a healthy diet. Avoiding tobacco and drug use. Limiting alcohol use. Practicing safe sex. Taking low-dose aspirin every day. Taking vitamin and mineral supplements as recommended by your health care provider. What happens during an annual well check? The services and screenings done by your health care provider during your annual well check will depend on your age, overall health, lifestyle risk factors, and family history of disease. Counseling  Your health care provider may ask you questions about your: Alcohol use. Tobacco use. Drug use. Emotional well-being. Home and relationship well-being. Sexual activity. Eating habits. History of falls. Memory and ability to understand (cognition). Work and work Astronomer. Reproductive health. Screening  You may have the following tests or measurements: Height, weight, and BMI. Blood pressure. Lipid and cholesterol levels. These may be checked every 5 years, or more frequently if you are over 54 years old. Skin check. Lung cancer screening. You may have this screening every year starting at age 18 if you have a 30-pack-year history of smoking and currently smoke or have quit within the past 15 years. Fecal occult blood test (FOBT) of the stool. You may have this test every year starting at age 35. Flexible sigmoidoscopy or colonoscopy. You may have a sigmoidoscopy every 5 years or a colonoscopy every 10 years starting at age 80. Hepatitis C blood test. Hepatitis B blood test. Sexually transmitted disease (STD) testing. Diabetes screening. This is done by checking your blood sugar (glucose) after you have not eaten for a while (fasting). You may have this done every 1-3 years. Bone density scan. This is done to screen for osteoporosis. You may have this done starting at age 64. Mammogram. This may be done every 1-2 years. Talk to your health care  provider about how often you should have regular  mammograms. Talk with your health care provider about your test results, treatment options, and if necessary, the need for more tests. Vaccines  Your health care provider may recommend certain vaccines, such as: Influenza vaccine. This is recommended every year. Tetanus, diphtheria, and acellular pertussis (Tdap, Td) vaccine. You may need a Td booster every 10 years. Zoster vaccine. You may need this after age 44. Pneumococcal 13-valent conjugate (PCV13) vaccine. One dose is recommended after age 74. Pneumococcal polysaccharide (PPSV23) vaccine. One dose is recommended after age 74. Talk to your health care provider about which screenings and vaccines you need and how often you need them. This information is not intended to replace advice given to you by your health care provider. Make sure you discuss any questions you have with your health care provider. Document Released: 01/18/2015 Document Revised: 09/11/2015 Document Reviewed: 10/23/2014 Elsevier Interactive Patient Education  2017 [employer].  Fall Prevention in the Home Falls can cause injuries. They can happen to people of all ages. There are many things you can do to make your home safe and to help prevent falls. What can I do on the outside of my home? Regularly fix the edges of walkways and driveways and fix any cracks. Remove anything that might make you trip as you walk through a door, such as a raised step or threshold. Trim any bushes or trees on the path to your home. Use bright outdoor lighting. Clear any walking paths of anything that might make someone trip, such as rocks or tools. Regularly check to see if handrails are loose or broken. Make sure that both sides of any steps have handrails. Any raised decks and porches should have guardrails on the edges. Have any leaves, snow, or ice cleared regularly. Use sand or salt on walking paths during winter. Clean up any spills in your garage right away. This includes oil  or grease spills. What can I do in the bathroom? Use night lights. Install grab bars by the toilet and in the tub and shower. Do not use towel bars as grab bars. Use non-skid mats or decals in the tub or shower. If you need to sit down in the shower, use a plastic, non-slip stool. Keep the floor dry. Clean up any water that spills on the floor as soon as it happens. Remove soap buildup in the tub or shower regularly. Attach bath mats securely with double-sided non-slip rug tape. Do not have throw rugs and other things on the floor that can make you trip. What can I do in the bedroom? Use night lights. Make sure that you have a light by your bed that is easy to reach. Do not use any sheets or blankets that are too big for your bed. They should not hang down onto the floor. Have a firm chair that has side arms. You can use this for support while you get dressed. Do not have throw rugs and other things on the floor that can make you trip. What can I do in the kitchen? Clean up any spills right away. Avoid walking on wet floors. Keep items that you use a lot in easy-to-reach places. If you need to reach something above you, use a strong step stool that has a grab bar. Keep electrical cords out of the way. Do not use floor polish or wax that makes floors slippery. If you must use wax, use non-skid floor wax. Do not have throw rugs  and other things on the floor that can make you trip. What can I do with my stairs? Do not leave any items on the stairs. Make sure that there are handrails on both sides of the stairs and use them. Fix handrails that are broken or loose. Make sure that handrails are as long as the stairways. Check any carpeting to make sure that it is firmly attached to the stairs. Fix any carpet that is loose or worn. Avoid having throw rugs at the top or bottom of the stairs. If you do have throw rugs, attach them to the floor with carpet tape. Make sure that you have a light  switch at the top of the stairs and the bottom of the stairs. If you do not have them, ask someone to add them for you. What else can I do to help prevent falls? Wear shoes that: Do not have high heels. Have rubber bottoms. Are comfortable and fit you well. Are closed at the toe. Do not wear sandals. If you use a stepladder: Make sure that it is fully opened. Do not climb a closed stepladder. Make sure that both sides of the stepladder are locked into place. Ask someone to hold it for you, if possible. Clearly mark and make sure that you can see: Any grab bars or handrails. First and last steps. Where the edge of each step is. Use tools that help you move around (mobility aids) if they are needed. These include: Canes. Walkers. Scooters. Crutches. Turn on the lights when you go into a dark area. Replace any light bulbs as soon as they burn out. Set up your furniture so you have a clear path. Avoid moving your furniture around. If any of your floors are uneven, fix them. If there are any pets around you, be aware of where they are. Review your medicines with your doctor. Some medicines can make you feel dizzy. This can increase your chance of falling. Ask your doctor what other things that you can do to help prevent falls. This information is not intended to replace advice given to you by your health care provider. Make sure you discuss any questions you have with your health care provider. Document Released: 10/18/2008 Document Revised: 05/30/2015 Document Reviewed: 01/26/2014 Elsevier Interactive Patient Education  2017 [employer].

## 2020-08-13 NOTE — Progress Notes (Signed)
Subjective:   Florinda MarkerJill Lamour Bowdoin is a 65 y.o. female who presents for Medicare Annual (Subsequent) preventive examination.  Review of Systems     Cardiac Risk Factors include: advanced age (>5755men, 38>65 women);dyslipidemia;hypertension;obesity (BMI >30kg/m2)     Objective:    Today's Vitals   08/13/20 1120  BP: 128/80  Pulse: 79  Resp: 16  Temp: 97.6 F (36.4 C)  SpO2: 98%  Weight: 200 lb (90.7 kg)  Height: 5\' 3"  (1.6 m)  PainSc: 0-No pain   Body mass index is 35.43 kg/m.  Advanced Directives 08/12/2020 07/12/2020 03/22/2020 04/14/2019 03/23/2019 03/09/2019 06/27/2018  Does Patient Have a Medical Advance Directive? No No No No No No No  Does patient want to make changes to medical advance directive? - - - - - - -  Would patient like information on creating a medical advance directive? No - Patient declined No - Patient declined No - Patient declined No - Patient declined No - Patient declined Yes (MAU/Ambulatory/Procedural Areas - Information given) No - Guardian declined  Pre-existing out of facility DNR order (yellow form or pink MOST form) - - - - - - -    Current Medications (verified) Outpatient Encounter Medications as of 08/13/2020  Medication Sig   losartan-hydrochlorothiazide (HYZAAR) 100-25 MG tablet TAKE 1 TABLET BY MOUTH DAILY   Probiotic Product (PROBIOTIC PO) Take 1 capsule by mouth daily.   vitamin B-12 (CYANOCOBALAMIN) 1000 MCG tablet Take 1,000 mcg by mouth daily.   No facility-administered encounter medications on file as of 08/13/2020.    Allergies (verified) Plaquenil [hydroxychloroquine sulfate], Azithromycin, Lactose intolerance (gi), and Penicillin g   History: Past Medical History:  Diagnosis Date   Arthritis    Blind right eye    sees colors   Chronic anemia    Detached retina, right    corrected good for about 6 months sees peripheral colors only   History of kidney stones    Hypertension    Hyperthyroidism    Lupus (HCC)    Pneumonia     Retinal detachment    Spasmodic dysphonia    Stroke (HCC) 2015   no permanant defecits had weakness in left arm in hospital Had PT also   Vertigo    Hx of preceding stroke   Past Surgical History:  Procedure Laterality Date   ABDOMINAL HYSTERECTOMY     BREAST LUMPECTOMY     left breast benign   KNEE SURGERY Left    THYROID SURGERY     TOTAL HIP ARTHROPLASTY Left 06/27/2018   Procedure: TOTAL HIP ARTHROPLASTY ANTERIOR APPROACH;  Surgeon: Gean Birchwoodowan, Frank, MD;  Location: [facility];  Service: Orthopedics;  Laterality: Left;   Family History  Problem Relation Age of Onset   Cancer Mother        liver   Diabetes Mother    Arthritis Mother    Cancer Father        prostate,METS, stomach    Ovarian cancer Sister    Throat cancer Brother    Social History   Socioeconomic History   Marital status: Single    Spouse name: Not on file   Number of children: 2   Years of education: college   Highest education level: Not on file  Occupational History   Occupation: [employer]  Tobacco Use   Smoking status: Never   Smokeless tobacco: Never  Vaping Use   Vaping Use: Never used  Substance and Sexual Activity   Alcohol use: No   Drug use:  No   Sexual activity: Not Currently  Other Topics Concern   Not on file  Social History Narrative   Not on file   Social Determinants of Health   Financial Resource Strain: Low Risk    Difficulty of Paying Living Expenses: Not hard at all  Food Insecurity: No Food Insecurity   Worried About Running Out of Food in the Last Year: Never true   Ran Out of Food in the Last Year: Never true  Transportation Needs: No Transportation Needs   Lack of Transportation (Medical): No   Lack of Transportation (Non-Medical): No  Physical Activity: Sufficiently Active   Days of Exercise per Week: 5 days   Minutes of Exercise per Session: 30 min  Stress: No Stress Concern Present   Feeling of Stress : Not at all  Social Connections: Moderately Integrated    Frequency of Communication with Friends and Family: More than three times a week   Frequency of Social Gatherings with Friends and Family: More than three times a week   Attends Religious Services: More than 4 times per year   Active Member of [employer] or Organizations: Yes   Attends Engineer, structural: More than 4 times per year   Marital Status: Divorced    Tobacco Counseling Counseling given: Not Answered   Clinical Intake:  Pre-visit preparation completed: Yes  Pain : No/denies pain Pain Score: 0-No pain     BMI - recorded: 35.43 Nutritional Status: BMI > 30  Obese Nutritional Risks: None Diabetes: No  How often do you need to have someone help you when you read instructions, pamphlets, or other written materials from your doctor or pharmacy?: 1 - Never What is the last grade level you completed in school?: Associate's Degree  Diabetic? no  Interpreter Needed?: No  Information entered by :: Susie Cassette, LPN   Activities of Daily Living In your present state of health, do you have any difficulty performing the following activities: 08/13/2020 07/12/2020  Hearing? N N  Vision? N N  Difficulty concentrating or making decisions? N N  Walking or climbing stairs? N N  Dressing or bathing? N N  Doing errands, shopping? N N  Preparing Food and eating ? N -  Using the Toilet? N -  In the past six months, have you accidently leaked urine? N -  Do you have problems with loss of bowel control? N -  Managing your Medications? N -  Managing your Finances? N -  Housekeeping or managing your Housekeeping? N -  Some recent data might be hidden    Patient Care Team: Myrlene Broker, MD as PCP - General (Internal Medicine)  Indicate any recent Medical Services you may have received from other than [facility] providers in the past year (date may be approximate).     Assessment:   This is a routine wellness examination for [place].  Hearing/Vision screen Hearing  Screening - Comments:: Patient denied any hearing difficulty. Vision Screening - Comments:: Patient wears glasses.  Eye exam done annually by Dr. London Sheer.  Dietary issues and exercise activities discussed: Current Exercise Habits: Home exercise routine, Type of exercise: walking, Time (Minutes): 30, Frequency (Times/Week): 5, Weekly Exercise (Minutes/Week): 150, Intensity: Mild, Exercise limited by: neurologic condition(s);orthopedic condition(s)   Goals Addressed               This Visit's Progress     Patient Stated (pt-stated)        I want to continue to function  and independent.      Depression Screen PHQ 2/9 Scores 08/13/2020 06/13/2019 06/22/2017 10/28/2016 11/12/2014  PHQ - 2 Score 0 0 0 1 0    Fall Risk Fall Risk  08/13/2020 06/13/2019 06/22/2017 11/12/2014  Falls in the past year? 0 0 No No  Number falls in past yr: 0 - - -  Injury with Fall? 0 - - -  Risk for fall due to : No Fall Risks - - -  Follow up Falls evaluation completed - - -    FALL RISK PREVENTION PERTAINING TO THE HOME:  Any stairs in or around the home? Yes  If so, are there any without handrails? No  Home free of loose throw rugs in walkways, pet beds, electrical cords, etc? Yes  Adequate lighting in your home to reduce risk of falls? Yes   ASSISTIVE DEVICES UTILIZED TO PREVENT FALLS:  Life alert? No  Use of a cane, walker or w/c? No  Grab bars in the bathroom? No  Shower chair or bench in shower? No  Elevated toilet seat or a handicapped toilet? No   TIMED UP AND GO:  Was the test performed? Yes .  Length of time to ambulate 10 feet: 8 sec.   Gait steady and fast without use of assistive device  Cognitive Function: Normal cognitive status assessed by direct observation by this Nurse Health Advisor. No abnormalities found.          Immunizations Immunization History  Administered Date(s) Administered   Fluad Quad(high Dose 65+) 10/21/2018   Hep A / Hep B 03/08/2017   Influenza Whole  12/19/2007   Influenza,inj,Quad PF,6+ Mos 11/12/2014, 10/01/2015, 10/28/2016, 02/09/2018   Influenza-Unspecified 11/15/2013   PFIZER(Purple Top)SARS-COV-2 Vaccination 06/06/2019, 07/06/2019   PPD Test 09/12/2013, 01/09/2015, 03/18/2016, 03/23/2016, 08/10/2017   Pneumococcal Conjugate-13 10/21/2018   Pneumococcal Polysaccharide-23 03/14/2020   Tdap 12/05/2014   Zoster Recombinat (Shingrix) 06/24/2020   Zoster, Live 01/02/2015    TDAP status: Up to date  Flu Vaccine status: Up to date  Pneumococcal vaccine status: Up to date  Covid-19 vaccine status: Completed vaccines  Qualifies for Shingles Vaccine? Yes   Zostavax completed Yes   Shingrix Completed?: Yes  Screening Tests Health Maintenance  Topic Date Due   COVID-19 Vaccine (3 - Pfizer risk series) 08/03/2019   INFLUENZA VACCINE  08/05/2020   Zoster Vaccines- Shingrix (2 of 2) 08/19/2020   MAMMOGRAM  05/18/2021   TETANUS/TDAP  12/04/2024   COLONOSCOPY (Pts 45-17yrs Insurance coverage will need to be confirmed)  01/15/2025   DEXA SCAN  Completed   Hepatitis C Screening  Completed   PNA vac Low Risk Adult  Completed   HPV VACCINES  Aged Out    Health Maintenance  Health Maintenance Due  Topic Date Due   COVID-19 Vaccine (3 - Pfizer risk series) 08/03/2019   INFLUENZA VACCINE  08/05/2020    Colorectal cancer screening: Type of screening: Colonoscopy. Completed 01/16/2015. Repeat every 10 years  Mammogram status: Completed 05/19/2019. Repeat every year  Bone Density status: Completed 03/15/2020. Results reflect: Bone density results: OSTEOPENIA. Repeat every 2 years.  Lung Cancer Screening: (Low Dose CT Chest recommended if Age 37-80 years, 30 pack-year currently smoking OR have quit w/in 15years.) does not qualify.   Lung Cancer Screening Referral: no  Additional Screening:  Hepatitis C Screening: does qualify; Completed yes  Vision Screening: Recommended annual ophthalmology exams for early detection of  glaucoma and other disorders of the eye. Is the patient up to date  with their annual eye exam?  Yes  Who is the provider or what is the name of the office in which the patient attends annual eye exams? London Sheer, OD. If pt is not established with a provider, would they like to be referred to a provider to establish care? No .   Dental Screening: Recommended annual dental exams for proper oral hygiene  Community Resource Referral / Chronic Care Management: CRR required this visit?  No   CCM required this visit?  No      Plan:     I have personally reviewed and noted the following in the patient's chart:   Medical and social history Use of alcohol, tobacco or illicit drugs  Current medications and supplements including opioid prescriptions.  Functional ability and status Nutritional status Physical activity Advanced directives List of other physicians Hospitalizations, surgeries, and ER visits in previous 12 months Vitals Screenings to include cognitive, depression, and falls Referrals and appointments  In addition, I have reviewed and discussed with patient certain preventive protocols, quality metrics, and best practice recommendations. A written personalized care plan for preventive services as well as general preventive health recommendations were provided to patient.     Mickeal Needy, LPN   04/08/345   Nurse Notes: n/a

## 2020-08-14 ENCOUNTER — Ambulatory Visit: Payer: Medicare HMO | Admitting: Rheumatology

## 2020-08-14 NOTE — Progress Notes (Signed)
Office Visit Note  Patient: Elaine Allen             Date of Birth: 1955-11-04           MRN: [id number]             PCP: Myrlene Broker, MD Referring: Myrlene Broker, * Visit Date: 08/28/2020 Occupation: @GUAROCC @  Subjective:  Medication management.   History of Present Illness: Anis Cinelli is a 65 y.o. female with a history of systemic lupus and osteoarthritis.  She had been receiving Rituxan through Dr. 71 for the treatment of ITP.  Her last dose of Rituxan was on August 19, 2020.  She has a follow-up appointment on August 29 for lab work.  She has been experiencing some stiffness and discomfort in her hands, left hip and knees.  She has not seen any joint swelling.  Activities of Daily Living:  Patient reports morning stiffness for 2-3 hours.   Patient Denies nocturnal pain.  Difficulty dressing/grooming: Denies Difficulty climbing stairs: Reports Difficulty getting out of chair: Reports Difficulty using hands for taps, buttons, cutlery, and/or writing: Denies  Review of Systems  Constitutional:  Positive for fatigue.  HENT:  Negative for mouth sores, mouth dryness and nose dryness.        Nose sore  Eyes:  Negative for pain, itching and dryness.  Respiratory:  Negative for shortness of breath and difficulty breathing.   Cardiovascular:  Negative for chest pain and palpitations.  Gastrointestinal:  Negative for blood in stool, constipation and diarrhea.  Endocrine: Negative for increased urination.  Genitourinary:  Negative for difficulty urinating.  Musculoskeletal:  Positive for joint pain, joint pain and morning stiffness. Negative for joint swelling, myalgias, muscle tenderness and myalgias.  Skin:  Negative for color change, rash, redness and sensitivity to sunlight.  Allergic/Immunologic: Negative for susceptible to infections.  Neurological:  Positive for weakness. Negative for dizziness, numbness, headaches and memory loss.   Hematological:  Negative for bruising/bleeding tendency and swollen glands.  Psychiatric/Behavioral:  Positive for sleep disturbance. Negative for confusion.    PMFS History:  Patient Active Problem List   Diagnosis Date Noted   Thrombocytopenia (HCC) 07/12/2020   Hypokalemia 07/12/2020   Neutropenia (HCC) 07/12/2020   Routine general medical examination at a health care facility 03/15/2020   Immune thrombocytopenia (HCC) 03/26/2019   AVN of femur (HCC) 06/23/2018   Greater trochanteric bursitis of left hip 04/08/2018   Left knee pain 04/06/2018   Numbness in feet 09/14/2017   Elevated LFTs 02/24/2017   Pre-diabetes 02/19/2017   Fatigue 02/19/2017   OA (osteoarthritis) of hip 10/29/2016   Numbness and tingling in left arm 05/05/2016   Cough 12/25/2015   Wheezing 12/25/2015   Abdominal pain 12/25/2015   Back pain 10/01/2015   Insomnia 03/01/2015   Family hx of colon cancer 11/12/2014   Essential hypertension, benign 05/15/2013   Other and unspecified hyperlipidemia 05/15/2013   Vertebral artery dissection (HCC) 05/14/2013   Hx of completed stroke 05/14/2013   Chronic anemia    Primary osteoarthritis of right knee 12/19/2007   Primary hyperparathyroidism (HCC) 11/24/2007   Osteopenia 11/24/2007   Lupus (systemic lupus erythematosus) (HCC) 01/05/2002    Past Medical History:  Diagnosis Date   Arthritis    Blind right eye    sees colors   Chronic anemia    Detached retina, right    corrected good for about 6 months sees peripheral colors only   History of kidney stones  Hypertension    Hyperthyroidism    Lupus (HCC)    Pneumonia    Retinal detachment    Spasmodic dysphonia    Stroke (HCC) 2015   no permanant defecits had weakness in left arm in hospital Had PT also   Vertigo    Hx of preceding stroke    Family History  Problem Relation Age of Onset   Cancer Mother        liver   Diabetes Mother    Arthritis Mother    Cancer Father        prostate,METS,  stomach    Ovarian cancer Sister    Throat cancer Brother    Past Surgical History:  Procedure Laterality Date   ABDOMINAL HYSTERECTOMY     BREAST LUMPECTOMY     left breast benign   KNEE SURGERY Left    THYROID SURGERY     TOTAL HIP ARTHROPLASTY Left 06/27/2018   Procedure: TOTAL HIP ARTHROPLASTY ANTERIOR APPROACH;  Surgeon: Gean Birchwood, MD;  Location: [facility];  Service: Orthopedics;  Laterality: Left;   Social History   Social History Narrative   Not on file   Immunization History  Administered Date(s) Administered   Fluad Quad(high Dose 65+) 10/21/2018   Hep A / Hep B 03/08/2017   Influenza Whole 12/19/2007   Influenza,inj,Quad PF,6+ Mos 11/12/2014, 10/01/2015, 10/28/2016, 02/09/2018   Influenza-Unspecified 11/15/2013   PFIZER(Purple Top)SARS-COV-2 Vaccination 06/06/2019, 07/06/2019   PPD Test 09/12/2013, 01/09/2015, 03/18/2016, 03/23/2016, 08/10/2017   Pneumococcal Conjugate-13 10/21/2018   Pneumococcal Polysaccharide-23 03/14/2020   Tdap 12/05/2014   Zoster Recombinat (Shingrix) 06/24/2020   Zoster, Live 01/02/2015     Objective: Vital Signs: BP 108/68 (BP Location: Left Arm, Patient Position: Sitting, Cuff Size: Large)   Pulse (!) 105   Ht 5\' 3"  (1.6 m)   Wt 192 lb 9.6 oz (87.4 kg)   BMI 34.12 kg/m    Physical Exam Vitals and nursing note reviewed.  Constitutional:      Appearance: She is well-developed.  HENT:     Head: Normocephalic and atraumatic.  Eyes:     Conjunctiva/sclera: Conjunctivae normal.  Cardiovascular:     Rate and Rhythm: Normal rate and regular rhythm.     Heart sounds: Normal heart sounds.  Pulmonary:     Effort: Pulmonary effort is normal.     Breath sounds: Normal breath sounds.  Abdominal:     General: Bowel sounds are normal.     Palpations: Abdomen is soft.  Musculoskeletal:     Cervical back: Normal range of motion.  Lymphadenopathy:     Cervical: No cervical adenopathy.  Skin:    General: Skin is warm and dry.      Capillary Refill: Capillary refill takes less than 2 seconds.  Neurological:     Mental Status: She is alert and oriented to person, place, and time.  Psychiatric:        Behavior: Behavior normal.     Musculoskeletal Exam: C-spine was in good range of motion.  Shoulder joints, elbow joints, wrist joints, MCPs PIPs and DIPs with good range of motion with no synovitis.  Hip joints, knee joints, ankles, MTPs and PIPs with good range of motion with no synovitis.  She has some stiffness and range of motion of her knees.  CDAI Exam: CDAI Score: -- Patient Global: --; Provider Global: -- Swollen: --; Tender: -- Joint Exam 08/28/2020   No joint exam has been documented for this visit   There is currently no  information documented on the homunculus. Go to the Rheumatology activity and complete the homunculus joint exam.  Investigation: No additional findings.  Imaging: No results found.  Recent Labs: Lab Results  Component Value Date   WBC 3.1 (L) 08/19/2020   HGB 12.6 08/19/2020   PLT 190 08/19/2020   NA 142 08/19/2020   K 3.3 (L) 08/19/2020   CL 106 08/19/2020   CO2 25 08/19/2020   GLUCOSE 107 (H) 08/19/2020   BUN 11 08/19/2020   CREATININE 0.86 08/19/2020   BILITOT 0.7 08/19/2020   ALKPHOS 79 08/19/2020   AST 18 08/19/2020   ALT 16 08/19/2020   PROT 7.4 08/19/2020   ALBUMIN 3.6 08/19/2020   CALCIUM 9.2 08/19/2020   GFRAA 85 07/11/2020   QFTBGOLDPLUS NEGATIVE 01/31/2020    Speciality Comments: PLQ-blisters all over the body  Procedures:  No procedures performed Allergies: Plaquenil [hydroxychloroquine sulfate], Azithromycin, Lactose intolerance (gi), and Penicillin g   Assessment / Plan:     Visit Diagnoses: SLE (systemic lupus erythematosus related syndrome) (HCC) - Dxd in [place]. History of rash, hives, fatigue, nasal ulcers, sicca symptoms, hair loss, Raynauds, arthralgias. +ANA,+ Ro , elevated sed rate, ITP.  Patient has been off Imuran since July 11, 2020.   She has been getting Rituxan from Dr. Candise Che.  Her thrombocytopenia has corrected.  She had last Rituxan infusion on August 15.  She is getting labs monitored through Dr. Candise Che.  We are holding Imuran .  I am uncertain if the thrombocytopenia is related to Imuran.  I have sent a message to Dr. Candise Che to get advise regarding future therapy which could be Imuran versus CellCept.  I would wait for her neutropenia to correct before we will initiate treatment.  Her most recent serology did not show active lupus.  She had no synovitis on my examination today.  High risk medication use -she was previously on Imuran which is on hold.  Immune thrombocytopenia (HCC) - History of ITP secondary to SLE.  Managed by Dr. Candise Che. Previously treated with rituxan, steriods, and IVIG starting in April 2021 and achieved remission.  Lupus anticoagulant positive-patient has a history of a stroke in the past.  I would request and advised from Dr. Candise Che regarding anticoagulation.  TPMT intermediate metabolizer (HCC) - Advised to discontinue Imuran.    Elevated LFTs-LFTs are normal now.  Primary osteoarthritis of both hands - Osteoarthritic changes noted on exam and on x-rays.  Status post total hip replacement, left - History of left AVN s/p THA June 2020-SE of Steroid use.     Primary osteoarthritis of right knee-she has off-and-on discomfort.  Primary osteoarthritis of both feet  DDD (degenerative disc disease), lumbar - X-rays ordered by PCP revealed degenerative changes.   Vertebral artery dissection (HCC)  Hx of completed stroke  Pre-diabetes  Vitamin D deficiency  Primary hyperparathyroidism (HCC)  Spasmodic dysphonia  Essential hypertension, benign-blood pressure is normal today.  Orders: No orders of the defined types were placed in this encounter.  No orders of the defined types were placed in this encounter.   Follow-Up Instructions: Return in about 3 months (around 11/28/2020) for  SLE.   Pollyann Savoy, MD  Note - This record has been created using Animal nutritionist.  Chart creation errors have been sought, but may not always  have been located. Such creation errors do not reflect on  the standard of medical care.

## 2020-08-19 ENCOUNTER — Inpatient Hospital Stay: Payer: Medicare HMO

## 2020-08-19 ENCOUNTER — Other Ambulatory Visit: Payer: Self-pay

## 2020-08-19 ENCOUNTER — Inpatient Hospital Stay (HOSPITAL_BASED_OUTPATIENT_CLINIC_OR_DEPARTMENT_OTHER): Payer: Medicare HMO | Admitting: Physician Assistant

## 2020-08-19 ENCOUNTER — Other Ambulatory Visit: Payer: Self-pay | Admitting: Physician Assistant

## 2020-08-19 VITALS — BP 148/87 | HR 80 | Temp 98.3°F | Resp 16 | Wt 192.0 lb

## 2020-08-19 DIAGNOSIS — H5461 Unqualified visual loss, right eye, normal vision left eye: Secondary | ICD-10-CM | POA: Diagnosis not present

## 2020-08-19 DIAGNOSIS — Z79899 Other long term (current) drug therapy: Secondary | ICD-10-CM | POA: Diagnosis not present

## 2020-08-19 DIAGNOSIS — Z5112 Encounter for antineoplastic immunotherapy: Secondary | ICD-10-CM | POA: Diagnosis not present

## 2020-08-19 DIAGNOSIS — D693 Immune thrombocytopenic purpura: Secondary | ICD-10-CM

## 2020-08-19 DIAGNOSIS — I10 Essential (primary) hypertension: Secondary | ICD-10-CM | POA: Diagnosis not present

## 2020-08-19 DIAGNOSIS — D696 Thrombocytopenia, unspecified: Secondary | ICD-10-CM

## 2020-08-19 DIAGNOSIS — E876 Hypokalemia: Secondary | ICD-10-CM | POA: Diagnosis not present

## 2020-08-19 DIAGNOSIS — Z8673 Personal history of transient ischemic attack (TIA), and cerebral infarction without residual deficits: Secondary | ICD-10-CM | POA: Diagnosis not present

## 2020-08-19 LAB — CBC WITH DIFFERENTIAL (CANCER CENTER ONLY)
Abs Immature Granulocytes: 0.01 10*3/uL (ref 0.00–0.07)
Basophils Absolute: 0 10*3/uL (ref 0.0–0.1)
Basophils Relative: 0 %
Eosinophils Absolute: 0.1 10*3/uL (ref 0.0–0.5)
Eosinophils Relative: 2 %
HCT: 38.1 % (ref 36.0–46.0)
Hemoglobin: 12.6 g/dL (ref 12.0–15.0)
Immature Granulocytes: 0 %
Lymphocytes Relative: 49 %
Lymphs Abs: 1.6 10*3/uL (ref 0.7–4.0)
MCH: 30.1 pg (ref 26.0–34.0)
MCHC: 33.1 g/dL (ref 30.0–36.0)
MCV: 90.9 fL (ref 80.0–100.0)
Monocytes Absolute: 0.5 10*3/uL (ref 0.1–1.0)
Monocytes Relative: 15 %
Neutro Abs: 1.1 10*3/uL — ABNORMAL LOW (ref 1.7–7.7)
Neutrophils Relative %: 34 %
Platelet Count: 190 10*3/uL (ref 150–400)
RBC: 4.19 MIL/uL (ref 3.87–5.11)
RDW: 15 % (ref 11.5–15.5)
WBC Count: 3.1 10*3/uL — ABNORMAL LOW (ref 4.0–10.5)
nRBC: 0 % (ref 0.0–0.2)

## 2020-08-19 LAB — CMP (CANCER CENTER ONLY)
ALT: 16 U/L (ref 0–44)
AST: 18 U/L (ref 15–41)
Albumin: 3.6 g/dL (ref 3.5–5.0)
Alkaline Phosphatase: 79 U/L (ref 38–126)
Anion gap: 11 (ref 5–15)
BUN: 11 mg/dL (ref 8–23)
CO2: 25 mmol/L (ref 22–32)
Calcium: 9.2 mg/dL (ref 8.9–10.3)
Chloride: 106 mmol/L (ref 98–111)
Creatinine: 0.86 mg/dL (ref 0.44–1.00)
GFR, Estimated: >60 mL/min (ref >60)
Glucose, Bld: 107 mg/dL — ABNORMAL HIGH (ref 70–99)
Potassium: 3.3 mmol/L — ABNORMAL LOW (ref 3.5–5.1)
Sodium: 142 mmol/L (ref 135–145)
Total Bilirubin: 0.7 mg/dL (ref 0.3–1.2)
Total Protein: 7.4 g/dL (ref 6.5–8.1)

## 2020-08-19 MED ORDER — METHYLPREDNISOLONE SODIUM SUCC 125 MG IJ SOLR
80.0000 mg | Freq: Once | INTRAMUSCULAR | Status: AC
  Administered 2020-08-19: 80 mg via INTRAVENOUS
  Filled 2020-08-19: qty 2

## 2020-08-19 MED ORDER — DIPHENHYDRAMINE HCL 50 MG/ML IJ SOLN
25.0000 mg | Freq: Once | INTRAMUSCULAR | Status: AC
  Administered 2020-08-19: 25 mg via INTRAVENOUS
  Filled 2020-08-19: qty 1

## 2020-08-19 MED ORDER — FAMOTIDINE 20 MG IN NS 100 ML IVPB
20.0000 mg | Freq: Once | INTRAVENOUS | Status: AC
  Administered 2020-08-19: 20 mg via INTRAVENOUS
  Filled 2020-08-19: qty 100

## 2020-08-19 MED ORDER — POTASSIUM CHLORIDE CRYS ER 20 MEQ PO TBCR
20.0000 meq | EXTENDED_RELEASE_TABLET | Freq: Every day | ORAL | 0 refills | Status: DC
Start: 2020-08-19 — End: 2021-11-26

## 2020-08-19 MED ORDER — MONTELUKAST SODIUM 10 MG PO TABS
10.0000 mg | ORAL_TABLET | Freq: Once | ORAL | Status: AC
  Administered 2020-08-19: 10 mg via ORAL
  Filled 2020-08-19: qty 1

## 2020-08-19 MED ORDER — SODIUM CHLORIDE 0.9 % IV SOLN
375.0000 mg/m2 | Freq: Once | INTRAVENOUS | Status: AC
  Administered 2020-08-19: 700 mg via INTRAVENOUS
  Filled 2020-08-19: qty 50

## 2020-08-19 MED ORDER — ACETAMINOPHEN 325 MG PO TABS
650.0000 mg | ORAL_TABLET | Freq: Once | ORAL | Status: AC
  Administered 2020-08-19: 650 mg via ORAL
  Filled 2020-08-19: qty 2

## 2020-08-19 MED ORDER — SODIUM CHLORIDE 0.9 % IV SOLN: Freq: Once | INTRAVENOUS | Status: AC

## 2020-08-19 MED ORDER — LORAZEPAM 2 MG/ML IJ SOLN
0.5000 mg | Freq: Once | INTRAMUSCULAR | Status: AC | PRN
  Administered 2020-08-19: 0.5 mg via INTRAVENOUS
  Filled 2020-08-19 (×2): qty 1

## 2020-08-19 NOTE — Progress Notes (Signed)
HEMATOLOGY/ONCOLOGY CLINIC NOTE  Date of Service: 08/19/2020  Patient Care Team: Myrlene Broker, MD as PCP - General (Internal Medicine)  REFERRING PHYSICIAN: Myrlene Broker, *  CHIEF COMPLAINTS/PURPOSE OF CONSULTATION:  F/u for mx of ITP   INTERVAL HISTORY  Elaine Allen is a wonderful 65 y.o. female who is here today for a follow up for ITP prior to her fourth weekly Rituximab infusion. She reports that her energy levels and appetite are overall stable. She reports mild nausea in the morning that improves with saltine crackers. She denies vomiting or abdominal pain. Her bowel movements are regular without diarrhea or constipation. Patient continues to have diffuse bodyaches and joint pain from SLE since discontinuing Imuran last month. She denies easy bruising or signs of bleeding. Patient denies any fevers, chills, night sweats, shortness of breath, chest pain or cough. She has no other complaints.   MEDICAL HISTORY:  Past Medical History:  Diagnosis Date   Arthritis    Blind right eye    sees colors   Chronic anemia    Detached retina, right    corrected good for about 6 months sees peripheral colors only   History of kidney stones    Hypertension    Hyperthyroidism    Lupus (HCC)    Pneumonia    Retinal detachment    Spasmodic dysphonia    Stroke (HCC) 2015   no permanant defecits had weakness in left arm in hospital Had PT also   Vertigo    Hx of preceding stroke     SURGICAL HISTORY: Past Surgical History:  Procedure Laterality Date   ABDOMINAL HYSTERECTOMY     BREAST LUMPECTOMY     left breast benign   KNEE SURGERY Left    THYROID SURGERY     TOTAL HIP ARTHROPLASTY Left 06/27/2018   Procedure: TOTAL HIP ARTHROPLASTY ANTERIOR APPROACH;  Surgeon: Gean Birchwood, MD;  Location: [facility];  Service: Orthopedics;  Laterality: Left;     SOCIAL HISTORY: Social History   Socioeconomic History   Marital status: Single    Spouse name: Not on  file   Number of children: 2   Years of education: college   Highest education level: Not on file  Occupational History   Occupation: options  Tobacco Use   Smoking status: Never   Smokeless tobacco: Never  Vaping Use   Vaping Use: Never used  Substance and Sexual Activity   Alcohol use: No   Drug use: No   Sexual activity: Not Currently  Other Topics Concern   Not on file  Social History Narrative   Not on file   Social Determinants of Health   Financial Resource Strain: Low Risk    Difficulty of Paying Living Expenses: Not hard at all  Food Insecurity: No Food Insecurity   Worried About Programme researcher, broadcasting/film/video in the Last Year: Never true   Ran Out of Food in the Last Year: Never true  Transportation Needs: No Transportation Needs   Lack of Transportation (Medical): No   Lack of Transportation (Non-Medical): No  Physical Activity: Sufficiently Active   Days of Exercise per Week: 5 days   Minutes of Exercise per Session: 30 min  Stress: No Stress Concern Present   Feeling of Stress : Not at all  Social Connections: Moderately Integrated   Frequency of Communication with Friends and Family: More than three times a week   Frequency of Social Gatherings with Friends and Family: More than three  times a week   Attends Religious Services: More than 4 times per year   Active Member of Clubs or Organizations: Yes   Attends Engineer, structural: More than 4 times per year   Marital Status: Divorced  Catering manager Violence: Not At Risk   Fear of Current or Ex-Partner: No   Emotionally Abused: No   Physically Abused: No   Sexually Abused: No     FAMILY HISTORY: Family History  Problem Relation Age of Onset   Cancer Mother        liver   Diabetes Mother    Arthritis Mother    Cancer Father        prostate,METS, stomach    Ovarian cancer Sister    Throat cancer Brother      ALLERGIES:   is allergic to plaquenil [hydroxychloroquine sulfate], azithromycin,  lactose intolerance (gi), and penicillin g.   MEDICATIONS:  Current Outpatient Medications  Medication Sig Dispense Refill   losartan-hydrochlorothiazide (HYZAAR) 100-25 MG tablet TAKE 1 TABLET BY MOUTH DAILY 30 tablet 5   Probiotic Product (PROBIOTIC PO) Take 1 capsule by mouth daily.     vitamin B-12 (CYANOCOBALAMIN) 1000 MCG tablet Take 1,000 mcg by mouth daily.     No current facility-administered medications for this visit.    REVIEW OF SYSTEMS:   10 Point review of Systems was done is negative except as noted above.  PHYSICAL EXAMINATION: ECOG PERFORMANCE STATUS: 1 - Symptomatic but completely ambulatory  There were no vitals filed for this visit.   There were no vitals filed for this visit.   There is no height or weight on file to calculate BMI.   NAD GENERAL:alert, in no acute distress and comfortable SKIN: no acute rashes, no significant lesions EYES: conjunctiva are pink and non-injected, sclera anicteric OROPHARYNX: MMM, no exudates, no oropharyngeal erythema or ulceration NECK: supple, no JVD LYMPH:  no palpable lymphadenopathy in the cervical, axillary or inguinal regions LUNGS: clear to auscultation b/l with normal respiratory effort HEART: regular rate & rhythm ABDOMEN:  normoactive bowel sounds , non tender, not distended. Extremity: no pedal edema PSYCH: alert & oriented x 3 with fluent speech NEURO: no focal motor/sensory deficits   LABORATORY DATA:  I have reviewed the data as listed  CBC Latest Ref Rng & Units 08/19/2020 08/12/2020 08/06/2020  WBC 4.0 - 10.5 K/uL 3.1(L) 3.7(L) 4.3  Hemoglobin 12.0 - 15.0 g/dL 16.1 11.8(L) 11.8(L)  Hematocrit 36.0 - 46.0 % 38.1 35.6(L) 35.4(L)  Platelets 150 - 400 K/uL 190 201 184    CMP Latest Ref Rng & Units 08/19/2020 08/12/2020 08/06/2020  Glucose 70 - 99 mg/dL 096(E) 454(U) 981(X)  BUN 8 - 23 mg/dL 11 12 17   Creatinine 0.44 - 1.00 mg/dL 9.14 7.82  Sodium 135 - 145 mmol/L 142 142 142  Potassium 3.5 - 5.1  mmol/L 3.3(L) 3.5 3.6  Chloride 98 - 111 mmol/L 106 108 105  CO2 22 - 32 mmol/L 25 25 30   Calcium 8.9 - 10.3 mg/dL 9.2 9.3 9.4  Total Protein 6.5 - 8.1 g/dL 7.4 7.1 7.3  Total Bilirubin 0.3 - 1.2 mg/dL 0.7 0.6 0.6  Alkaline Phos 38 - 126 U/L 79 74 63  AST 15 - 41 U/L 18 22 20   ALT 0 - 44 U/L 16 21 15    .  RADIOGRAPHIC STUDIES: I have personally reviewed the radiological images as listed and agreed with the findings in the report. No results found.    ASSESSMENT &  PLAN:   Elaine Allen is a 65 y.o. female with:  1. Thrombocytopenia- -Likely related to Immune thrombocytopenia in the setting of lupus and previous use of Imuran. -Labs from today show platelet count continues to be within normal limits at 190K.  -Patient will proceed with 4th (out of total 4) infusion of Rituximab today.   2. Lupus - Imuran is currently on hold since hospitalization from 07/14/2020 as it was felt to contributing to thrombocytopenia. Patient is reporting diffuse joint pain and body aches today since medication has been on hold. Patient is scheduled for a follow up with Dr. Corliss Skains on 08/28/2020.   3. H/o AVN -  related to steroids s/p left THA.  4. Lupus anticoagulant positive  5. Hypokalemia- Today's potassium level is 3.3. Will send 3 day course of potassium chloride 20 mEq. Will repeat levels in 2 weeks.    FOLLOW UP -2 weeks with labs only -RTC in 4 weeks with labs and office visit with Dr. Candise Che.    All of the patient's questions were answered with apparent satisfaction. The patient knows to call the clinic with any problems, questions or concerns.  I have spent a total of 25 minutes minutes of face-to-face and non-face-to-face time, preparing to see the patient, performing a medically appropriate examination, counseling and educating the patient, ordering medications,  documenting clinical information in the electronic health record,  and care coordination.   Briant Cedar,  PA-C Hematology and Oncology [facility]

## 2020-08-19 NOTE — Progress Notes (Signed)
This RN pulled patient's ativan from the pyxis. RN accidentally verified inventory count and then did not remove ativan from cube. RN realized this and then went to remove patient's ativan and entered in the current value of ativan vials in pyxis (6). This created a discrepancy in the pyxis machine, Lowella Bandy, charge RN made aware.

## 2020-08-19 NOTE — Patient Instructions (Signed)
[facility] MEDICAL ONCOLOGY  Discharge Instructions: Thank you for choosing [facility] to provide your oncology and hematology care.   If you have a lab appointment with the Cancer Center, please go directly to the Cancer Center and check in at the registration area.   Wear comfortable clothing and clothing appropriate for easy access to any Portacath or PICC line.   We strive to give you quality time with your provider. You may need to reschedule your appointment if you arrive late (15 or more minutes).  Arriving late affects you and other patients whose appointments are after yours.  Also, if you miss three or more appointments without notifying the office, you may be dismissed from the clinic at the provider's discretion.      For prescription refill requests, have your pharmacy contact our office and allow 72 hours for refills to be completed.    Today you received the following chemotherapy and/or immunotherapy agents Rituxan      To help prevent nausea and vomiting after your treatment, we encourage you to take your nausea medication as directed.  BELOW ARE SYMPTOMS THAT SHOULD BE REPORTED IMMEDIATELY: *FEVER GREATER THAN 100.4 F (38 C) OR HIGHER *CHILLS OR SWEATING *NAUSEA AND VOMITING THAT IS NOT CONTROLLED WITH YOUR NAUSEA MEDICATION *UNUSUAL SHORTNESS OF BREATH *UNUSUAL BRUISING OR BLEEDING *URINARY PROBLEMS (pain or burning when urinating, or frequent urination) *BOWEL PROBLEMS (unusual diarrhea, constipation, pain near the anus) TENDERNESS IN MOUTH AND THROAT WITH OR WITHOUT PRESENCE OF ULCERS (sore throat, sores in mouth, or a toothache) UNUSUAL RASH, SWELLING OR PAIN  UNUSUAL VAGINAL DISCHARGE OR ITCHING   Items with * indicate a potential emergency and should be followed up as soon as possible or go to the Emergency Department if any problems should occur.  Please show the CHEMOTHERAPY ALERT CARD or IMMUNOTHERAPY ALERT CARD at check-in to the  Emergency Department and triage nurse.  Should you have questions after your visit or need to cancel or reschedule your appointment, please contact [facility]ICAL [facility]COL[facility]  Dept: [phone]  and follow the prompts.  Office hours are 8:00 a.m. to 4:30 p.m. Monday - Friday. Please note that voicemails left after 4:00 p.m. may not be returned until the following business day.  We are closed weekends and major holidays. You have access to a nurse at all times for urgent questions. Please call the main number to the clinic Dept: [phone] and follow the prompts.   For any non-urgent questions, you may also contact your provider using MyChart. We now offer e-Visits for anyone 18 and older to request care online for non-urgent symptoms. For details visit [email or web address].   Also download the MyChart app! Go to the app store, search "MyChart", open the app, select [facility], and log in with your MyChart username and password.  Due to Covid, a mask is required upon entering the hospital/clinic. If you do not have a mask, one will be given to you upon arrival. For doctor visits, patients may have 1 support person aged 18 or older with them. For treatment visits, patients cannot have anyone with them due to current Covid guidelines and our immunocompromised population.   

## 2020-08-26 ENCOUNTER — Telehealth: Payer: Self-pay | Admitting: Physician Assistant

## 2020-08-26 NOTE — Telephone Encounter (Signed)
Scheduled per los. Called and spoke with patient. Confirmed appt 

## 2020-08-28 ENCOUNTER — Encounter: Payer: Self-pay | Admitting: Rheumatology

## 2020-08-28 ENCOUNTER — Ambulatory Visit: Payer: Medicare HMO | Admitting: Rheumatology

## 2020-08-28 ENCOUNTER — Other Ambulatory Visit: Payer: Self-pay

## 2020-08-28 VITALS — BP 108/68 | HR 105 | Ht 63.0 in | Wt 192.6 lb

## 2020-08-28 DIAGNOSIS — R760 Raised antibody titer: Secondary | ICD-10-CM | POA: Diagnosis not present

## 2020-08-28 DIAGNOSIS — M1711 Unilateral primary osteoarthritis, right knee: Secondary | ICD-10-CM | POA: Diagnosis not present

## 2020-08-28 DIAGNOSIS — Z96642 Presence of left artificial hip joint: Secondary | ICD-10-CM

## 2020-08-28 DIAGNOSIS — D693 Immune thrombocytopenic purpura: Secondary | ICD-10-CM | POA: Diagnosis not present

## 2020-08-28 DIAGNOSIS — Z79899 Other long term (current) drug therapy: Secondary | ICD-10-CM | POA: Diagnosis not present

## 2020-08-28 DIAGNOSIS — E8889 Other specified metabolic disorders: Secondary | ICD-10-CM

## 2020-08-28 DIAGNOSIS — M329 Systemic lupus erythematosus, unspecified: Secondary | ICD-10-CM | POA: Diagnosis not present

## 2020-08-28 DIAGNOSIS — E210 Primary hyperparathyroidism: Secondary | ICD-10-CM

## 2020-08-28 DIAGNOSIS — R7303 Prediabetes: Secondary | ICD-10-CM

## 2020-08-28 DIAGNOSIS — M19071 Primary osteoarthritis, right ankle and foot: Secondary | ICD-10-CM

## 2020-08-28 DIAGNOSIS — M19072 Primary osteoarthritis, left ankle and foot: Secondary | ICD-10-CM

## 2020-08-28 DIAGNOSIS — R7989 Other specified abnormal findings of blood chemistry: Secondary | ICD-10-CM

## 2020-08-28 DIAGNOSIS — M19041 Primary osteoarthritis, right hand: Secondary | ICD-10-CM | POA: Diagnosis not present

## 2020-08-28 DIAGNOSIS — E559 Vitamin D deficiency, unspecified: Secondary | ICD-10-CM

## 2020-08-28 DIAGNOSIS — Z8673 Personal history of transient ischemic attack (TIA), and cerebral infarction without residual deficits: Secondary | ICD-10-CM

## 2020-08-28 DIAGNOSIS — I10 Essential (primary) hypertension: Secondary | ICD-10-CM

## 2020-08-28 DIAGNOSIS — M19042 Primary osteoarthritis, left hand: Secondary | ICD-10-CM

## 2020-08-28 DIAGNOSIS — J383 Other diseases of vocal cords: Secondary | ICD-10-CM

## 2020-08-28 DIAGNOSIS — M5136 Other intervertebral disc degeneration, lumbar region: Secondary | ICD-10-CM

## 2020-08-28 DIAGNOSIS — I7774 Dissection of vertebral artery: Secondary | ICD-10-CM

## 2020-09-02 ENCOUNTER — Other Ambulatory Visit: Payer: Self-pay

## 2020-09-02 ENCOUNTER — Other Ambulatory Visit: Payer: Self-pay | Admitting: *Deleted

## 2020-09-02 ENCOUNTER — Inpatient Hospital Stay: Payer: Medicare HMO

## 2020-09-02 DIAGNOSIS — D693 Immune thrombocytopenic purpura: Secondary | ICD-10-CM

## 2020-09-02 DIAGNOSIS — D696 Thrombocytopenia, unspecified: Secondary | ICD-10-CM

## 2020-09-02 DIAGNOSIS — Z5112 Encounter for antineoplastic immunotherapy: Secondary | ICD-10-CM | POA: Diagnosis not present

## 2020-09-02 DIAGNOSIS — Z8673 Personal history of transient ischemic attack (TIA), and cerebral infarction without residual deficits: Secondary | ICD-10-CM | POA: Diagnosis not present

## 2020-09-02 DIAGNOSIS — Z79899 Other long term (current) drug therapy: Secondary | ICD-10-CM | POA: Diagnosis not present

## 2020-09-02 DIAGNOSIS — H5461 Unqualified visual loss, right eye, normal vision left eye: Secondary | ICD-10-CM | POA: Diagnosis not present

## 2020-09-02 DIAGNOSIS — E876 Hypokalemia: Secondary | ICD-10-CM | POA: Diagnosis not present

## 2020-09-02 DIAGNOSIS — I10 Essential (primary) hypertension: Secondary | ICD-10-CM | POA: Diagnosis not present

## 2020-09-02 LAB — CMP (CANCER CENTER ONLY)
ALT: 14 U/L (ref 0–44)
AST: 21 U/L (ref 15–41)
Albumin: 3.5 g/dL (ref 3.5–5.0)
Alkaline Phosphatase: 75 U/L (ref 38–126)
Anion gap: 10 (ref 5–15)
BUN: 14 mg/dL (ref 8–23)
CO2: 25 mmol/L (ref 22–32)
Calcium: 9.1 mg/dL (ref 8.9–10.3)
Chloride: 107 mmol/L (ref 98–111)
Creatinine: 0.83 mg/dL (ref 0.44–1.00)
GFR, Estimated: >60 mL/min (ref >60)
Glucose, Bld: 109 mg/dL — ABNORMAL HIGH (ref 70–99)
Potassium: 3.5 mmol/L (ref 3.5–5.1)
Sodium: 142 mmol/L (ref 135–145)
Total Bilirubin: 0.8 mg/dL (ref 0.3–1.2)
Total Protein: 6.8 g/dL (ref 6.5–8.1)

## 2020-09-02 LAB — CBC WITH DIFFERENTIAL (CANCER CENTER ONLY)
Abs Immature Granulocytes: 0.01 10*3/uL (ref 0.00–0.07)
Basophils Absolute: 0 10*3/uL (ref 0.0–0.1)
Basophils Relative: 0 %
Eosinophils Absolute: 0.1 10*3/uL (ref 0.0–0.5)
Eosinophils Relative: 3 %
HCT: 35.8 % — ABNORMAL LOW (ref 36.0–46.0)
Hemoglobin: 12 g/dL (ref 12.0–15.0)
Immature Granulocytes: 0 %
Lymphocytes Relative: 49 %
Lymphs Abs: 1.6 10*3/uL (ref 0.7–4.0)
MCH: 30.2 pg (ref 26.0–34.0)
MCHC: 33.5 g/dL (ref 30.0–36.0)
MCV: 89.9 fL (ref 80.0–100.0)
Monocytes Absolute: 0.5 10*3/uL (ref 0.1–1.0)
Monocytes Relative: 15 %
Neutro Abs: 1.1 10*3/uL — ABNORMAL LOW (ref 1.7–7.7)
Neutrophils Relative %: 33 %
Platelet Count: 174 10*3/uL (ref 150–400)
RBC: 3.98 MIL/uL (ref 3.87–5.11)
RDW: 14 % (ref 11.5–15.5)
WBC Count: 3.2 10*3/uL — ABNORMAL LOW (ref 4.0–10.5)
nRBC: 0 % (ref 0.0–0.2)

## 2020-09-02 LAB — RETIC PANEL
Immature Retic Fract: 16.7 % — ABNORMAL HIGH (ref 2.3–15.9)
RBC.: 3.98 MIL/uL (ref 3.87–5.11)
Retic Count, Absolute: 62.1 10*3/uL (ref 19.0–186.0)
Retic Ct Pct: 1.6 % (ref 0.4–3.1)
Reticulocyte Hemoglobin: 33.2 pg (ref >27.9)

## 2020-09-13 ENCOUNTER — Other Ambulatory Visit: Payer: Self-pay

## 2020-09-13 DIAGNOSIS — D696 Thrombocytopenia, unspecified: Secondary | ICD-10-CM

## 2020-09-16 ENCOUNTER — Inpatient Hospital Stay: Payer: Medicare HMO | Attending: Hematology

## 2020-09-16 ENCOUNTER — Inpatient Hospital Stay: Payer: Medicare HMO | Admitting: Hematology

## 2020-09-16 ENCOUNTER — Other Ambulatory Visit: Payer: Self-pay

## 2020-09-16 VITALS — BP 151/91 | HR 81 | Temp 99.1°F | Resp 18 | Ht 63.0 in | Wt 195.8 lb

## 2020-09-16 DIAGNOSIS — Z79899 Other long term (current) drug therapy: Secondary | ICD-10-CM | POA: Diagnosis not present

## 2020-09-16 DIAGNOSIS — D693 Immune thrombocytopenic purpura: Secondary | ICD-10-CM | POA: Insufficient documentation

## 2020-09-16 DIAGNOSIS — Z8673 Personal history of transient ischemic attack (TIA), and cerebral infarction without residual deficits: Secondary | ICD-10-CM | POA: Insufficient documentation

## 2020-09-16 DIAGNOSIS — D696 Thrombocytopenia, unspecified: Secondary | ICD-10-CM

## 2020-09-16 DIAGNOSIS — D72819 Decreased white blood cell count, unspecified: Secondary | ICD-10-CM | POA: Diagnosis not present

## 2020-09-16 DIAGNOSIS — I10 Essential (primary) hypertension: Secondary | ICD-10-CM | POA: Insufficient documentation

## 2020-09-16 LAB — CBC WITH DIFFERENTIAL (CANCER CENTER ONLY)
Abs Immature Granulocytes: 0 10*3/uL (ref 0.00–0.07)
Basophils Absolute: 0 10*3/uL (ref 0.0–0.1)
Basophils Relative: 0 %
Eosinophils Absolute: 0.1 10*3/uL (ref 0.0–0.5)
Eosinophils Relative: 2 %
HCT: 36.5 % (ref 36.0–46.0)
Hemoglobin: 12 g/dL (ref 12.0–15.0)
Immature Granulocytes: 0 %
Lymphocytes Relative: 54 %
Lymphs Abs: 1.3 10*3/uL (ref 0.7–4.0)
MCH: 29.8 pg (ref 26.0–34.0)
MCHC: 32.9 g/dL (ref 30.0–36.0)
MCV: 90.6 fL (ref 80.0–100.0)
Monocytes Absolute: 0.3 10*3/uL (ref 0.1–1.0)
Monocytes Relative: 13 %
Neutro Abs: 0.8 10*3/uL — ABNORMAL LOW (ref 1.7–7.7)
Neutrophils Relative %: 31 %
Platelet Count: 188 10*3/uL (ref 150–400)
RBC: 4.03 MIL/uL (ref 3.87–5.11)
RDW: 13.2 % (ref 11.5–15.5)
WBC Count: 2.6 10*3/uL — ABNORMAL LOW (ref 4.0–10.5)
nRBC: 0 % (ref 0.0–0.2)

## 2020-09-16 LAB — CMP (CANCER CENTER ONLY)
ALT: 9 U/L (ref 0–44)
AST: 18 U/L (ref 15–41)
Albumin: 3.6 g/dL (ref 3.5–5.0)
Alkaline Phosphatase: 86 U/L (ref 38–126)
Anion gap: 8 (ref 5–15)
BUN: 10 mg/dL (ref 8–23)
CO2: 27 mmol/L (ref 22–32)
Calcium: 9.3 mg/dL (ref 8.9–10.3)
Chloride: 108 mmol/L (ref 98–111)
Creatinine: 0.84 mg/dL (ref 0.44–1.00)
GFR, Estimated: >60 mL/min (ref >60)
Glucose, Bld: 100 mg/dL — ABNORMAL HIGH (ref 70–99)
Potassium: 3.3 mmol/L — ABNORMAL LOW (ref 3.5–5.1)
Sodium: 143 mmol/L (ref 135–145)
Total Bilirubin: 0.6 mg/dL (ref 0.3–1.2)
Total Protein: 6.9 g/dL (ref 6.5–8.1)

## 2020-09-17 ENCOUNTER — Ambulatory Visit (INDEPENDENT_AMBULATORY_CARE_PROVIDER_SITE_OTHER): Payer: Medicare HMO | Admitting: Internal Medicine

## 2020-09-17 ENCOUNTER — Telehealth: Payer: Self-pay

## 2020-09-17 ENCOUNTER — Telehealth: Payer: Self-pay | Admitting: Hematology

## 2020-09-17 ENCOUNTER — Encounter: Payer: Self-pay | Admitting: Internal Medicine

## 2020-09-17 VITALS — BP 130/90 | HR 84 | Temp 98.9°F | Resp 18 | Ht 63.0 in | Wt 192.8 lb

## 2020-09-17 DIAGNOSIS — M7989 Other specified soft tissue disorders: Secondary | ICD-10-CM

## 2020-09-17 DIAGNOSIS — R1032 Left lower quadrant pain: Secondary | ICD-10-CM | POA: Diagnosis not present

## 2020-09-17 DIAGNOSIS — Z23 Encounter for immunization: Secondary | ICD-10-CM

## 2020-09-17 NOTE — Progress Notes (Signed)
   Subjective:   Patient ID: Elaine Allen, female    DOB: [birth year], 65 y.o.   MRN: [id number]  HPI The patient is a 65 YO female coming in for left flank pain in the last month or so. Also with new calf swelling both sides more right. This started about 2 weeks ago or so. Denies nausea or vomiting. Denies constipation or diarrhea.   Review of Systems  Constitutional: Negative.   HENT: Negative.    Eyes: Negative.   Respiratory:  Negative for cough, chest tightness and shortness of breath.   Cardiovascular:  Negative for chest pain, palpitations and leg swelling.  Gastrointestinal:  Positive for abdominal pain. Negative for abdominal distention, constipation, diarrhea, nausea and vomiting.  Genitourinary:  Positive for flank pain.  Skin: Negative.   Neurological: Negative.   Psychiatric/Behavioral: Negative.     Objective:  Physical Exam Constitutional:      Appearance: She is well-developed.  HENT:     Head: Normocephalic and atraumatic.  Cardiovascular:     Rate and Rhythm: Normal rate and regular rhythm.  Pulmonary:     Effort: Pulmonary effort is normal. No respiratory distress.     Breath sounds: Normal breath sounds. No wheezing or rales.  Abdominal:     General: Bowel sounds are normal. There is no distension.     Palpations: Abdomen is soft.     Tenderness: There is abdominal tenderness. There is no rebound.  Musculoskeletal:     Cervical back: Normal range of motion.  Skin:    General: Skin is warm and dry.  Neurological:     Mental Status: She is alert and oriented to person, place, and time.     Coordination: Coordination normal.    Vitals:   09/17/20 1015  BP: 130/90  Pulse: 84  Resp: 18  Temp: 98.9 F (37.2 C)  TempSrc: Oral  SpO2: 98%  Weight: 192 lb 12.8 oz (87.5 kg)  Height: 5\' 3"  (1.6 m)    This visit occurred during the SARS-CoV-2 public health emergency.  Safety protocols were in place, including screening questions prior to the visit,  additional usage of staff PPE, and extensive cleaning of exam room while observing appropriate contact time as indicated for disinfecting solutions.   Assessment & Plan:  Flu shot given at visit

## 2020-09-17 NOTE — Patient Instructions (Signed)
We will check the ultrasound of the legs and the scan of the stomach to find out what is causing the side pain.  We do not need any labs today.

## 2020-09-17 NOTE — Telephone Encounter (Signed)
Please advise as the pt has stated she missed a call to schedule an apptmnt for a scan she needs on her abdomen.

## 2020-09-17 NOTE — Telephone Encounter (Signed)
Left message with follow-up appointment per 9/12 los. 

## 2020-09-18 ENCOUNTER — Other Ambulatory Visit: Payer: Self-pay

## 2020-09-18 ENCOUNTER — Ambulatory Visit (HOSPITAL_COMMUNITY)
Admission: RE | Admit: 2020-09-18 | Discharge: 2020-09-18 | Disposition: A | Discharge status: Home / Self Care | Payer: Medicare HMO | Source: Ambulatory Visit | Attending: Cardiovascular Disease | Admitting: Cardiovascular Disease

## 2020-09-18 DIAGNOSIS — M7989 Other specified soft tissue disorders: Secondary | ICD-10-CM | POA: Diagnosis not present

## 2020-09-19 ENCOUNTER — Encounter: Payer: Self-pay | Admitting: Internal Medicine

## 2020-09-19 DIAGNOSIS — M7989 Other specified soft tissue disorders: Secondary | ICD-10-CM | POA: Insufficient documentation

## 2020-09-19 NOTE — Assessment & Plan Note (Signed)
Ordered US to rule out DVT. She previously had baker's cyst right sided on prior US >10 years ago.

## 2020-09-19 NOTE — Assessment & Plan Note (Signed)
Checking CT abdomen/pelvis to rule out diverticulitis or kidney stone causing flank pain.

## 2020-09-21 IMAGING — MG DIGITAL SCREENING BILAT W/ TOMO W/ CAD
6 of 10 series · 6 of 30 positions shown · non-contrast
Comparison: Previous exam(s).

CLINICAL DATA: Screening.

EXAM:
DIGITAL SCREENING BILATERAL MAMMOGRAM WITH TOMO AND CAD

[L MLO synth-2D]
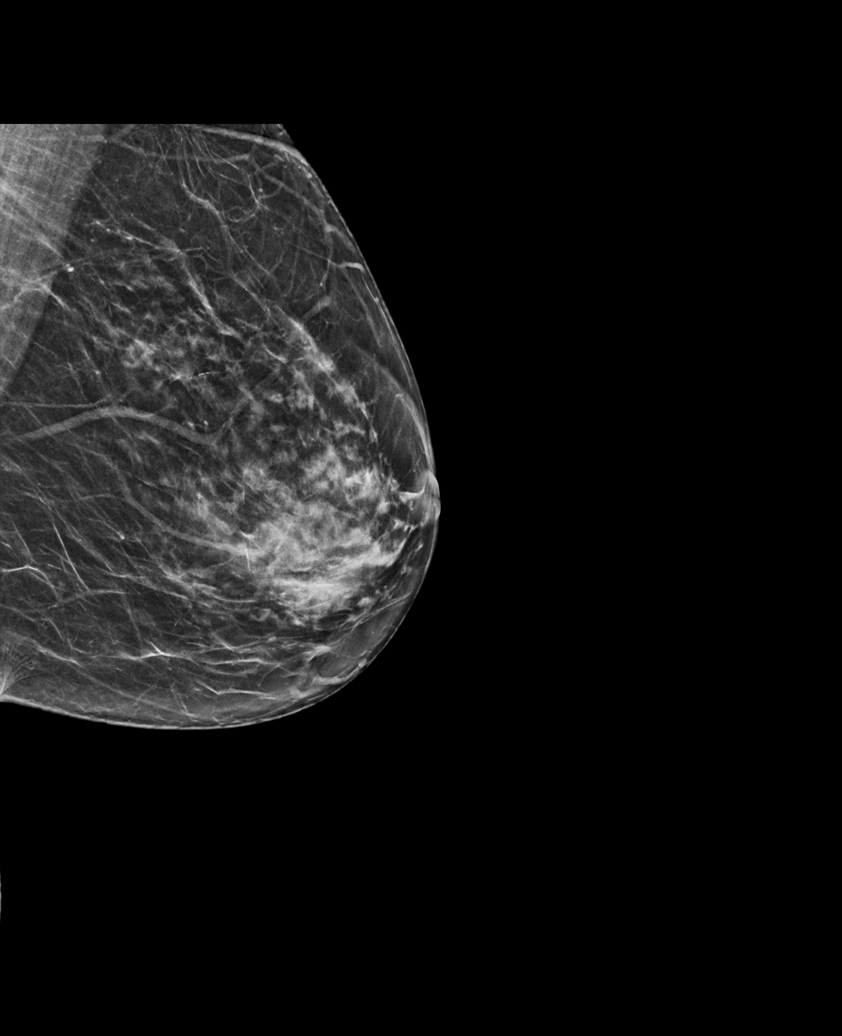

[R MLO synth-2D]
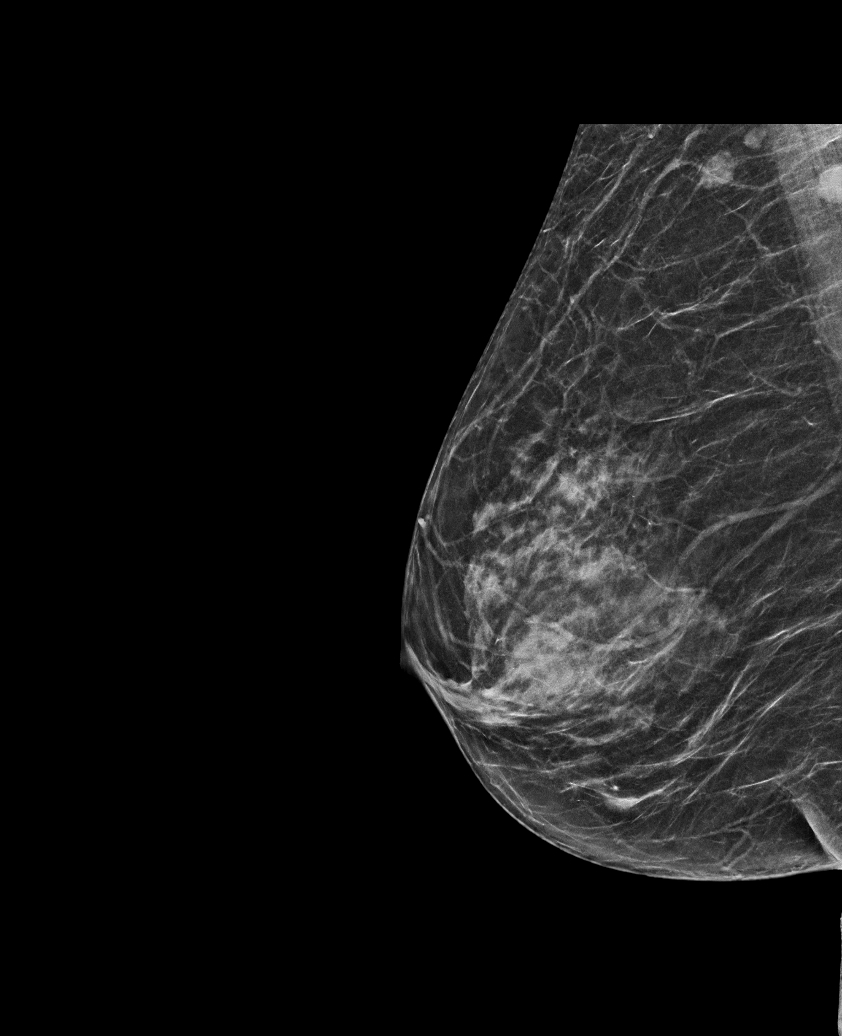

[R CC synth-2D (1 of 2)]
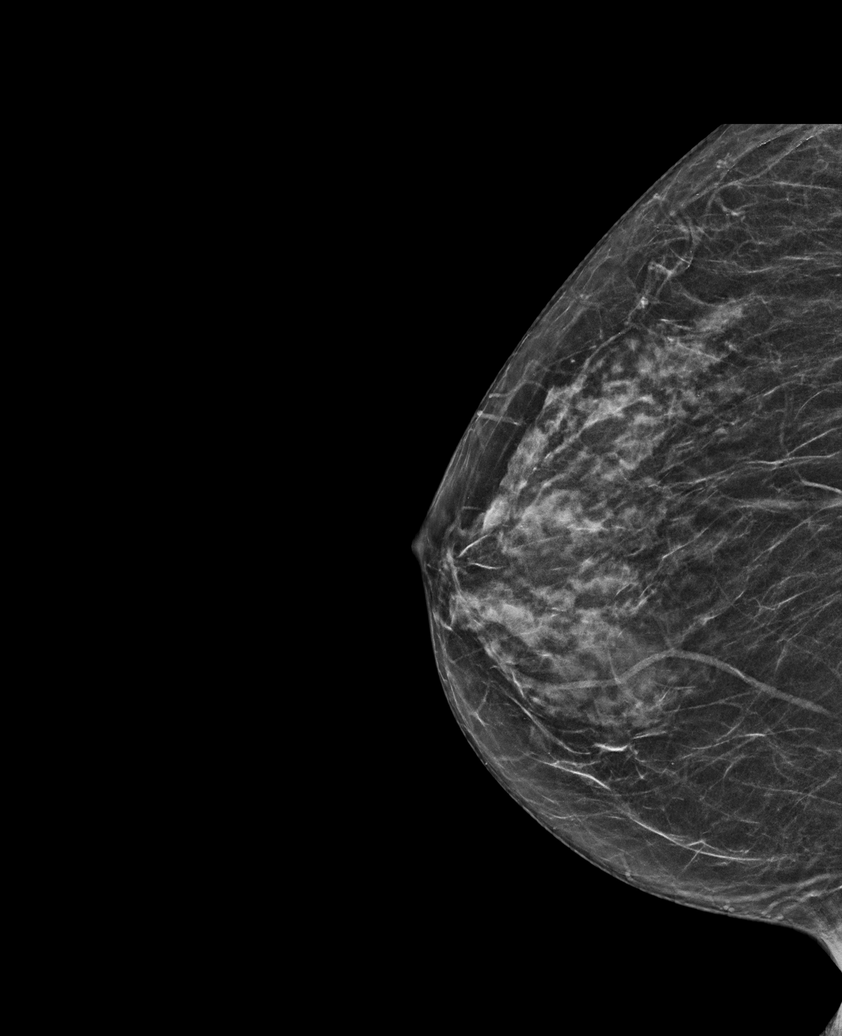

[R CC synth-2D (2 of 2)]
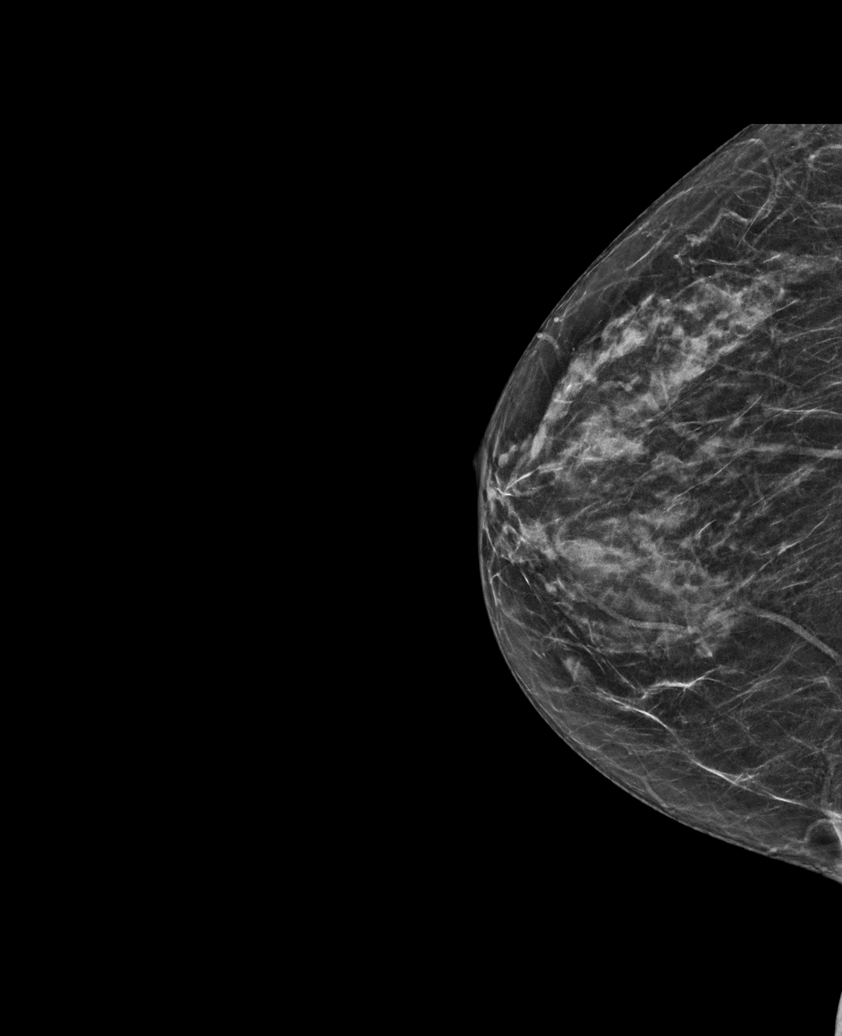

[L CC synth-2D]
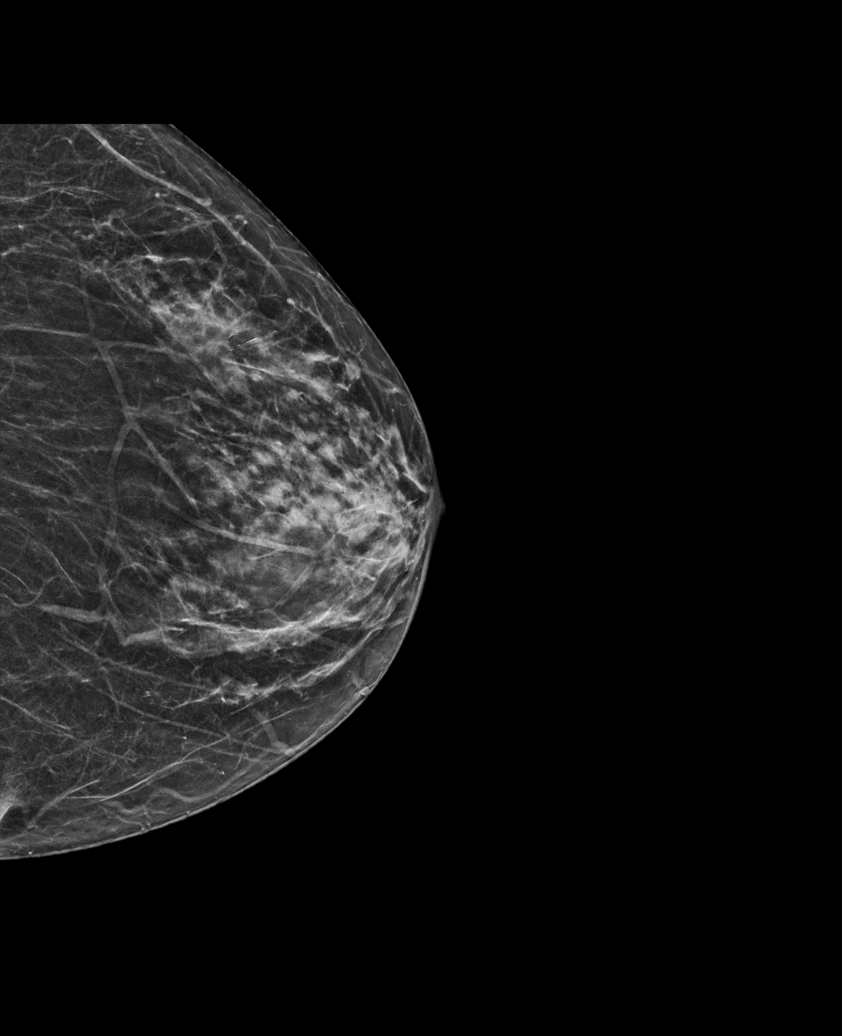

[L MLO tomo · tomo slice 31/62.0]
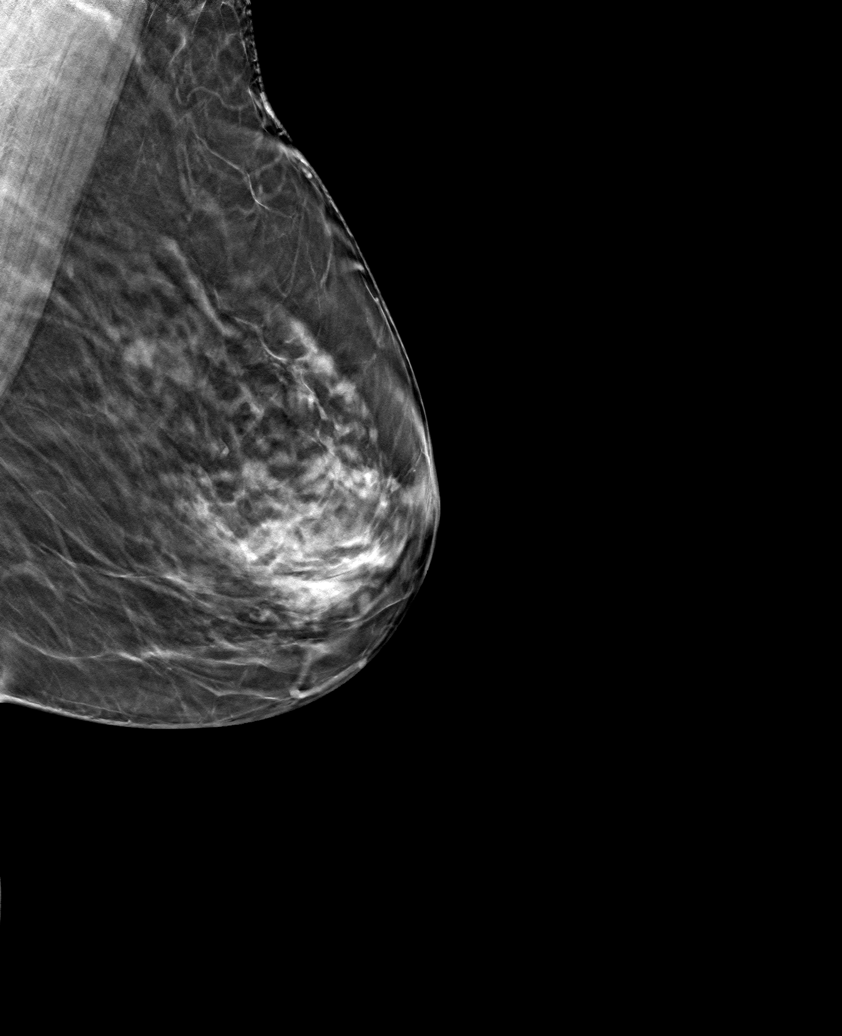

[6 of 30 positions shown; findings below may reference images not displayed]

ACR Breast Density Category c: The breast tissue is heterogeneously
dense, which may obscure small masses.
FINDINGS: There are no findings suspicious for malignancy. Images were
processed with CAD.
IMPRESSION: No mammographic evidence of malignancy. A result letter of this
screening mammogram will be mailed directly to the patient.

RECOMMENDATION:
Screening mammogram in one year. (Code:[id number]-[id number])

BI-RADS CATEGORY  1: Negative.

## 2020-09-22 NOTE — Progress Notes (Signed)
HEMATOLOGY/ONCOLOGY CLINIC NOTE  Date of Service: .09/16/2020   Patient Care Team: Myrlene Broker, MD as PCP - General (Internal Medicine)  REFERRING PHYSICIAN: Myrlene Broker, *  CHIEF COMPLAINTS/PURPOSE OF CONSULTATION:  F/u for mx of ITP   INTERVAL HISTORY  Elaine Allen is a wonderful 65 y.o. female who is here today for a follow up for ITP after having completed her 4 weekly doses of Rituxan for relapsed ITP in the context of a history of SLE.  She notes no acute new symptoms.  Does have some joint pains and stiffness and grade 1 fatigue Has followed with Dr. Corliss Skains to discuss further treatment options for her SLE. We discussed that this would be a decision made by her rheumatologist in keeping with different treatment options which she feels would be the most effective and be tolerated without significant cytopenias.  Patient's labs today normal hemoglobin of 12, normal platelets of 188k WBC count of 2.6k with an ANC of 800. Patient notes no issues with bleeding or abnormal bruising. No fevers no chills no night sweats no unexpected weight loss. No other toxicities from her lost treatment with Rituxan.  MEDICAL HISTORY:  Past Medical History:  Diagnosis Date   Arthritis    Blind right eye    sees colors   Chronic anemia    Detached retina, right    corrected good for about 6 months sees peripheral colors only   History of kidney stones    Hypertension    Hyperthyroidism    Lupus (HCC)    Pneumonia    Retinal detachment    Spasmodic dysphonia    Stroke (HCC) 2015   no permanant defecits had weakness in left arm in hospital Had PT also   Vertigo    Hx of preceding stroke     SURGICAL HISTORY: Past Surgical History:  Procedure Laterality Date   ABDOMINAL HYSTERECTOMY     BREAST LUMPECTOMY     left breast benign   KNEE SURGERY Left    THYROID SURGERY     TOTAL HIP ARTHROPLASTY Left 06/27/2018   Procedure: TOTAL HIP ARTHROPLASTY  ANTERIOR APPROACH;  Surgeon: Gean Birchwood, MD;  Location: [facility];  Service: Orthopedics;  Laterality: Left;     SOCIAL HISTORY: Social History   Socioeconomic History   Marital status: Single    Spouse name: Not on file   Number of children: 2   Years of education: college   Highest education level: Not on file  Occupational History   Occupation: options  Tobacco Use   Smoking status: Never   Smokeless tobacco: Never  Vaping Use   Vaping Use: Never used  Substance and Sexual Activity   Alcohol use: No   Drug use: No   Sexual activity: Not Currently  Other Topics Concern   Not on file  Social History Narrative   Not on file   Social Determinants of Health   Financial Resource Strain: Low Risk    Difficulty of Paying Living Expenses: Not hard at all  Food Insecurity: No Food Insecurity   Worried About Programme researcher, broadcasting/film/video in the Last Year: Never true   Ran Out of Food in the Last Year: Never true  Transportation Needs: No Transportation Needs   Lack of Transportation (Medical): No   Lack of Transportation (Non-Medical): No  Physical Activity: Sufficiently Active   Days of Exercise per Week: 5 days   Minutes of Exercise per Session: 30 min  Stress:  No Stress Concern Present   Feeling of Stress : Not at all  Social Connections: Moderately Integrated   Frequency of Communication with Friends and Family: More than three times a week   Frequency of Social Gatherings with Friends and Family: More than three times a week   Attends Religious Services: More than 4 times per year   Active Member of [employer] or Organizations: Yes   Attends Engineer, structural: More than 4 times per year   Marital Status: Divorced  Catering manager Violence: Not At Risk   Fear of Current or Ex-Partner: No   Emotionally Abused: No   Physically Abused: No   Sexually Abused: No     FAMILY HISTORY: Family History  Problem Relation Age of Onset   Cancer Mother        liver    Diabetes Mother    Arthritis Mother    Cancer Father        prostate,METS, stomach    Ovarian cancer Sister    Throat cancer Brother      ALLERGIES:   is allergic to plaquenil [hydroxychloroquine sulfate], azithromycin, lactose intolerance (gi), and penicillin g.   MEDICATIONS:  Current Outpatient Medications  Medication Sig Dispense Refill   losartan-hydrochlorothiazide (HYZAAR) 100-25 MG tablet TAKE 1 TABLET BY MOUTH DAILY 30 tablet 5   Probiotic Product (PROBIOTIC PO) Take 1 capsule by mouth daily.     vitamin B-12 (CYANOCOBALAMIN) 1000 MCG tablet Take 1,000 mcg by mouth daily.     potassium chloride SA (KLOR-CON) 20 MEQ tablet Take 1 tablet (20 mEq total) by mouth daily. (Patient not taking: No sig reported) 3 tablet 0   No current facility-administered medications for this visit.    REVIEW OF SYSTEMS:   .10 Point review of Systems was done is negative except as noted above.   PHYSICAL EXAMINATION: ECOG PERFORMANCE STATUS: 1 - Symptomatic but completely ambulatory  Vitals:   09/16/20 1051  BP: (!) 151/91  Pulse: 81  Resp: 18  Temp: 99.1 F (37.3 C)  SpO2: 100%     Filed Weights   09/16/20 1051  Weight: 195 lb 12.8 oz (88.8 kg)     Body mass index is 34.68 kg/m.  Marland Kitchen GENERAL:alert, in no acute distress and comfortable SKIN: no acute rashes, no significant lesions EYES: conjunctiva are pink and non-injected, sclera anicteric OROPHARYNX: MMM, no exudates, no oropharyngeal erythema or ulceration NECK: supple, no JVD LYMPH:  no palpable lymphadenopathy in the cervical, axillary or inguinal regions LUNGS: clear to auscultation b/l with normal respiratory effort HEART: regular rate & rhythm ABDOMEN:  normoactive bowel sounds , non tender, not distended. Extremity: no pedal edema PSYCH: alert & oriented x 3 with fluent speech NEURO: no focal motor/sensory deficits    LABORATORY DATA:  I have reviewed the data as listed  CBC Latest Ref Rng & Units  09/16/2020 09/02/2020 08/19/2020  WBC 4.0 - 10.5 K/uL 2.6(L) 3.2(L) 3.1(L)  Hemoglobin 12.0 - 15.0 g/dL 96.0 45.4 09.8  Hematocrit 36.0 - 46.0 % 36.5 35.8(L) 38.1  Platelets 150 - 400 K/uL 188 174 190  ANC 800  CMP Latest Ref Rng & Units 09/16/2020 09/02/2020 08/19/2020  Glucose 70 - 99 mg/dL 119(J) 478(G) 956(O)  BUN 8 - 23 mg/dL Creatinine 0.44 - 1.00 mg/dL 1.30 8.65 7.84  Sodium 135 - 145 mmol/L 143 142 142  Potassium 3.5 - 5.1 mmol/L 3.3(L) 3.5 3.3(L)  Chloride 98 - 111 mmol/L 108 107 106  CO2 22 - 32 mmol/L 27 25 25   Calcium 8.9 - 10.3 mg/dL 9.3 9.1 9.2  Total Protein 6.5 - 8.1 g/dL 6.9 6.8 7.4  Total Bilirubin 0.3 - 1.2 mg/dL 0.6 0.8 0.7  Alkaline Phos 38 - 126 U/L 86 75 79  AST 15 - 41 U/L 18 21 18   ALT 0 - 44 U/L 9 14 16    .  RADIOGRAPHIC STUDIES: I have personally reviewed the radiological images as listed and agreed with the findings in the report. VAS LOWER EXTREMITY VENOUS (DVT)  Result Date: 09/18/2020  Lower Venous DVT Study Patient Name:  ALIEA BOBE  Date of Exam:   09/18/2020 Medical Rec #: 09/20/2020            Accession #:    Florinda Marker Date of Birth: [birth year]            Patient Gender: F Patient Age:   66 years Exam Location:  [facility] Procedure:      VAS [id number] LOWER EXTREMITY VENOUS (DVT) Referring Phys: 09/02/1955 CRAWFORD --------------------------------------------------------------------------------  Indications: Patient reports h/o of chronic swelling in both legs, but has been experiencing increase right knee pain x 2-3 weeks. History of systemic Lupus. She denies SOB.  Risk Factors: None identified. Comparison Study: NA Performing Technologist: 79 RVT  Examination Guidelines: A complete evaluation includes B-mode imaging, spectral Doppler, color Doppler, and power Doppler as needed of all accessible portions of each vessel. Bilateral testing is considered an integral part of a complete examination. Limited examinations for reoccurring  indications may be performed as noted. The reflux portion of the exam is performed with the patient in reverse Trendelenburg.  +---------+---------------+---------+-----------+----------+--------------+ RIGHT    CompressibilityPhasicitySpontaneityPropertiesThrombus Aging +---------+---------------+---------+-----------+----------+--------------+ CFV      Full           Yes      Yes                                 +---------+---------------+---------+-----------+----------+--------------+ SFJ      Full           Yes      Yes                                 +---------+---------------+---------+-----------+----------+--------------+ FV Prox  Full           Yes      Yes                                 +---------+---------------+---------+-----------+----------+--------------+ FV Mid   Full                                                        +---------+---------------+---------+-----------+----------+--------------+ FV DistalFull           Yes      Yes                                 +---------+---------------+---------+-----------+----------+--------------+ PFV      Full           Yes      Yes                                 +---------+---------------+---------+-----------+----------+--------------+  POP      Full           Yes      Yes                                 +---------+---------------+---------+-----------+----------+--------------+ PTV      Full           Yes      Yes                                 +---------+---------------+---------+-----------+----------+--------------+ PERO     Full                    No                                  +---------+---------------+---------+-----------+----------+--------------+ Gastroc  Full                                                        +---------+---------------+---------+-----------+----------+--------------+ GSV      Full           Yes      Yes                                  +---------+---------------+---------+-----------+----------+--------------+   Right Technical Findings: Limited visualization of the PTVs and peroneal veins due to increase lower leg swelling. Specifically, the tibioperoneal confluence and popliteal vein are without thrombus. There is a complex cystic lesion with slight vascularity noted in the popliteal fossa, measuring 6.8 x 1.6 x 3.0 cm. This is suggestive for Baker's cyst.  +---------+---------------+---------+-----------+----------+--------------+ LEFT     CompressibilityPhasicitySpontaneityPropertiesThrombus Aging +---------+---------------+---------+-----------+----------+--------------+ CFV      Full           Yes      Yes                                 +---------+---------------+---------+-----------+----------+--------------+ SFJ      Full           Yes      Yes                                 +---------+---------------+---------+-----------+----------+--------------+ FV Prox  Full           Yes      Yes                                 +---------+---------------+---------+-----------+----------+--------------+ FV Mid   Full                                                        +---------+---------------+---------+-----------+----------+--------------+ FV DistalFull           Yes  Yes                                 +---------+---------------+---------+-----------+----------+--------------+ PFV      Full           Yes      Yes                                 +---------+---------------+---------+-----------+----------+--------------+ POP      Full           Yes      Yes                                 +---------+---------------+---------+-----------+----------+--------------+ PTV      Full                    No                                  +---------+---------------+---------+-----------+----------+--------------+ PERO     Full                    No                                   +---------+---------------+---------+-----------+----------+--------------+ Gastroc  Full                                                        +---------+---------------+---------+-----------+----------+--------------+ GSV      Full           Yes      Yes                                 +---------+---------------+---------+-----------+----------+--------------+   Left Technical Findings: Limited visualization of the PTVs and peroneal veins due to increase lower leg swelling. Specifically, the tibioperoneal confluence and popliteal vein are without thrombus.   Summary: BILATERAL: - No evidence of deep vein thrombosis seen in the lower extremities, bilaterally. - No evidence of superficial venous thrombosis in the lower extremities, bilaterally. - RIGHT: - A cystic structure is found in the popliteal fossa. - There is a complex cystic lesion with slight vascularity noted in the popliteal fossa, measuring 6.8 x 1.6 x 3.0 cm. This is suggestive for Baker's cyst.  LEFT: - No cystic structure found in the popliteal fossa.  *See table(s) above for measurements and observations. Electronically signed by Charlton Haws MD on 09/18/2020 at 9:06:11 PM.    Final       ASSESSMENT & PLAN:   Elaine Allen is a 65 y.o. female with:  1. Thrombocytopenia- -Likely related to Immune thrombocytopenia in the setting of lupus and previous use of Imuran. Patient was treated for her relapsed ITP with 4 doses of Rituxan again. -Labs from today show platelet count continues to be within normal limits at 188K.   2. Lupus - Imuran is currently on hold since hospitalization from 07/14/2020 as it was  felt to contributing to thrombocytopenia. Patient is reporting diffuse joint pain and body aches today since medication has been on hold. Patient is scheduled for a follow up with Dr. Corliss Skains on 08/28/2020.   3. H/o AVN -  related to steroids s/p left THA.  4. Lupus anticoagulant positive-history of old  stroke but that was related to vertebral artery dissection. -If platelets remain stable below 50k PCP/rheumatology could consider starting our baby aspirin 81 mg p.o. daily with monitoring.  5.  Leukopenia/neutropenia -likely autoimmune related to lupus.  Less likely from Rituxan. Will monitor PLAN -Patient is doing well and has no toxicities from her recent treatment with weekly Rituxan x4. -Labs today show normal platelet counts of 188k -Patient has had leukopenia today the WBC count of 2.6k and ANC of 800  -likely autoimmune neutropenia from her SLE less likely from Rituxan. -No indication for additional treatment of the patient's acute on chronic ITP at this time. -Follow-up with Dr. Deveshwar/rheumatology to determine appropriate next treatment for her SLE to try to control her disease.  Hopefully this treatment will also control her ITP for a while. Consideration of for CellCept was discussed with Dr. Corliss Skains. -Patient was recommended to get her yearly flu shot and new COVID-19 booster vaccine.  FOLLOW UP Phone visit in 2 months Please schedule labs 1 day prior to phone visit   All of the patient's questions were answered with apparent satisfaction. The patient knows to call the clinic with any problems, questions or concerns.  . The total time spent in the appointment was 30 minutes and more than 50% was on counseling and direct patient cares, coordination of care with her rheumatologist.  Wyvonnia Lora, MD Hematology and Oncology [facility]

## 2020-09-23 ENCOUNTER — Encounter: Payer: Self-pay | Admitting: Hematology

## 2020-10-07 ENCOUNTER — Encounter: Payer: Self-pay | Admitting: Hematology

## 2020-10-07 ENCOUNTER — Other Ambulatory Visit: Payer: Self-pay

## 2020-10-07 ENCOUNTER — Ambulatory Visit
Admission: RE | Admit: 2020-10-07 | Discharge: 2020-10-07 | Disposition: A | Discharge status: Home / Self Care | Payer: Medicare HMO | Source: Ambulatory Visit | Attending: Internal Medicine | Admitting: Internal Medicine

## 2020-10-07 DIAGNOSIS — R1032 Left lower quadrant pain: Secondary | ICD-10-CM

## 2020-10-22 DIAGNOSIS — Z0100 Encounter for examination of eyes and vision without abnormal findings: Secondary | ICD-10-CM | POA: Diagnosis not present

## 2020-10-22 DIAGNOSIS — H3122 Choroidal dystrophy (central areolar) (generalized) (peripapillary): Secondary | ICD-10-CM | POA: Diagnosis not present

## 2020-10-28 ENCOUNTER — Encounter: Payer: Self-pay | Admitting: Internal Medicine

## 2020-10-29 MED ORDER — DICLOFENAC SODIUM 2 % EX SOLN: 1.0000 | Freq: Four times a day (QID) | CUTANEOUS | 3 refills | Status: DC | PRN

## 2020-10-30 ENCOUNTER — Encounter: Payer: Self-pay | Admitting: Internal Medicine

## 2020-10-31 MED ORDER — DICLOFENAC SODIUM 1 % EX GEL: 2.0000 g | Freq: Four times a day (QID) | CUTANEOUS | 3 refills | Status: DC

## 2020-11-06 NOTE — Progress Notes (Deleted)
Office Visit Note  Patient: Elaine Allen             Date of Birth: February 19, 1955           MRN: [id number]             PCP: Hoyt Koch, MD Referring: Hoyt Koch, * Visit Date: 11/20/2020 Occupation: @GUAROCC @  Subjective:  Discuss adding cellcept   History of Present Illness: Elaine Allen is a 65 y.o. female with history of systemic lupus erythematosus, ITP, and osteoarthritis.  Patient was previously taking Imuran but discontinued due to worsening thrombocytopenia requiring hospitalization in July 2022.  At her last office visit there was discussion of adding on CellCept.  Her most recent visit with Dr. Irene Limbo was on 11/13/20.    Add cellcept? Aspirin 81 mg daily?   Activities of Daily Living:  Patient reports morning stiffness for *** {minute/hour:19697}.   Patient {ACTIONS;DENIES/REPORTS:21021675::"Denies"} nocturnal pain.  Difficulty dressing/grooming: {ACTIONS;DENIES/REPORTS:21021675::"Denies"} Difficulty climbing stairs: {ACTIONS;DENIES/REPORTS:21021675::"Denies"} Difficulty getting out of chair: {ACTIONS;DENIES/REPORTS:21021675::"Denies"} Difficulty using hands for taps, buttons, cutlery, and/or writing: {ACTIONS;DENIES/REPORTS:21021675::"Denies"}  No Rheumatology ROS completed.   PMFS History:  Patient Active Problem List   Diagnosis Date Noted  . Leg swelling 09/19/2020  . Thrombocytopenia ([place]) 07/12/2020  . Hypokalemia 07/12/2020  . Neutropenia ([place]) 07/12/2020  . Routine general medical examination at a health care facility 03/15/2020  . Immune thrombocytopenia ([place]) 03/26/2019  . AVN of femur ([place]) 06/23/2018  . Greater trochanteric bursitis of left hip 04/08/2018  . Left knee pain 04/06/2018  . Numbness in feet 09/14/2017  . Elevated LFTs 02/24/2017  . Pre-diabetes 02/19/2017  . Fatigue 02/19/2017  . OA (osteoarthritis) of hip 10/29/2016  . Numbness and tingling in left arm 05/05/2016  . Cough 12/25/2015  . Wheezing  12/25/2015  . Abdominal pain 12/25/2015  . Back pain 10/01/2015  . Insomnia 03/01/2015  . Family hx of colon cancer 11/12/2014  . Essential hypertension, benign 05/15/2013  . Other and unspecified hyperlipidemia 05/15/2013  . Vertebral artery dissection ([place]) 05/14/2013  . Hx of completed stroke 05/14/2013  . Chronic anemia   . Primary osteoarthritis of right knee 12/19/2007  . Primary hyperparathyroidism ([place]) 11/24/2007  . Osteopenia 11/24/2007  . Lupus (systemic lupus erythematosus) ([place]) 01/05/2002    Past Medical History:  Diagnosis Date  . Arthritis   . Blind right eye    sees colors  . Chronic anemia   . Detached retina, right    corrected good for about 6 months sees peripheral colors only  . History of kidney stones   . Hypertension   . Hyperthyroidism   . Lupus ([place])   . Pneumonia   . Retinal detachment   . Spasmodic dysphonia   . Stroke ([place]) 2015   no permanant defecits had weakness in left arm in hospital Had PT also  . Vertigo    Hx of preceding stroke    Family History  Problem Relation Age of Onset  . Cancer Mother        liver  . Diabetes Mother   . Arthritis Mother   . Cancer Father        prostate,METS, stomach   . Ovarian cancer Sister   . Throat cancer Brother    Past Surgical History:  Procedure Laterality Date  . ABDOMINAL HYSTERECTOMY    . BREAST LUMPECTOMY     left breast benign  . KNEE SURGERY Left   . THYROID SURGERY    . TOTAL HIP  ARTHROPLASTY Left 06/27/2018   Procedure: TOTAL HIP ARTHROPLASTY ANTERIOR APPROACH;  Surgeon: Gean Birchwood, MD;  Location: [facility];  Service: Orthopedics;  Laterality: Left;   Social History   Social History Narrative  . Not on file   Immunization History  Administered Date(s) Administered  . Fluad Quad(high Dose 65+) 10/21/2018, 09/17/2020  . Hep A / Hep B 03/08/2017  . Influenza Whole 12/19/2007  . Influenza,inj,Quad PF,6+ Mos 11/12/2014, 10/01/2015, 10/28/2016, 02/09/2018  .  Influenza-Unspecified 11/15/2013  . PFIZER(Purple Top)SARS-COV-2 Vaccination 06/06/2019, 07/06/2019  . PPD Test 09/12/2013, 01/09/2015, 03/18/2016, 03/23/2016, 08/10/2017  . Pneumococcal Conjugate-13 10/21/2018  . Pneumococcal Polysaccharide-23 03/14/2020  . Tdap 12/05/2014  . Zoster Recombinat (Shingrix) 06/24/2020  . Zoster, Live 01/02/2015     Objective: Vital Signs: There were no vitals taken for this visit.   Physical Exam Vitals and nursing note reviewed.  Constitutional:      Appearance: She is well-developed.  HENT:     Head: Normocephalic and atraumatic.  Eyes:     Conjunctiva/sclera: Conjunctivae normal.  Cardiovascular:     Rate and Rhythm: Normal rate and regular rhythm.     Heart sounds: Normal heart sounds.  Pulmonary:     Effort: Pulmonary effort is normal.     Breath sounds: Normal breath sounds.  Abdominal:     General: Bowel sounds are normal.     Palpations: Abdomen is soft.  Musculoskeletal:     Cervical back: Normal range of motion.  Lymphadenopathy:     Cervical: No cervical adenopathy.  Skin:    General: Skin is warm and dry.     Capillary Refill: Capillary refill takes less than 2 seconds.  Neurological:     Mental Status: She is alert and oriented to person, place, and time.  Psychiatric:        Behavior: Behavior normal.     Musculoskeletal Exam: ***  CDAI Exam: CDAI Score: -- Patient Global: --; Provider Global: -- Swollen: --; Tender: -- Joint Exam 11/20/2020   No joint exam has been documented for this visit   There is currently no information documented on the homunculus. Go to the Rheumatology activity and complete the homunculus joint exam.  Investigation: No additional findings.  Imaging: CT Abdomen Pelvis Wo Contrast  Result Date: 10/09/2020 CLINICAL DATA:  Patient complains of left lower quadrant pain for months off and on. History of surgeries: Hysterectomy, total left hip, left breast benign lumpectomy. History of  stroke, lupus, hypertension, and kidney stones. No cancer. Current smoker. EXAM: CT ABDOMEN AND PELVIS WITHOUT CONTRAST TECHNIQUE: Multidetector CT imaging of the abdomen and pelvis was performed following the standard protocol without IV contrast. COMPARISON:  U/S Abdomen 03/17/2017.  Xray Chest 09/06/2018. FINDINGS: Lower chest: No acute abnormality. Hepatobiliary: No focal liver abnormality is seen. No gallstones, gallbladder wall thickening, or biliary dilatation. Pancreas: Unremarkable. No pancreatic ductal dilatation or surrounding inflammatory changes. Spleen: Normal in size without focal abnormality. Adrenals/Urinary Tract: Normal adrenal glands. Bilateral parapelvic cysts. Nonobstructing 3 mm right renal calculus. No ureteral calculus. No bladder calculus. Normal bladder. Stomach/Bowel: Stomach is within normal limits. Appendix appears normal. No evidence of bowel wall thickening, distention, or inflammatory changes. Moderate amount of stool throughout the colon. Vascular/Lymphatic: No significant vascular findings are present. No enlarged abdominal or pelvic lymph nodes. Reproductive: No adnexal mass. Other: No abdominal ascites.  Small fat containing umbilical hernia. Musculoskeletal: No acute osseous abnormality. No aggressive osseous lesion. Mild broad-based disc bulge at L4-5 and L5-S1 with bilateral facet arthropathy and  foraminal narrowing. Intraosseous hemangioma in the L3 vertebral body. Grade 1 anterolisthesis of L3 on L4 secondary to facet disease. IMPRESSION: 1. No acute abdominal or pelvic pathology. 2. Moderate amount of stool throughout the colon. 3. Nonobstructing 3 mm right renal calculus. Electronically Signed   By: Kathreen Devoid M.D.   On: 10/09/2020 13:30    Recent Labs: Lab Results  Component Value Date   WBC 2.6 (L) 09/16/2020   HGB 12.0 09/16/2020   PLT 188 09/16/2020   NA 143 09/16/2020   K 3.3 (L) 09/16/2020   CL 108 09/16/2020   CO2 27 09/16/2020   GLUCOSE 100 (H)  09/16/2020   BUN 10 09/16/2020   CREATININE 0.84 09/16/2020   BILITOT 0.6 09/16/2020   ALKPHOS 86 09/16/2020   AST 18 09/16/2020   ALT 9 09/16/2020   PROT 6.9 09/16/2020   ALBUMIN 3.6 09/16/2020   CALCIUM 9.3 09/16/2020   GFRAA 85 07/11/2020   QFTBGOLDPLUS NEGATIVE 01/31/2020    Speciality Comments: PLQ-blisters all over the body  Procedures:  No procedures performed Allergies: Plaquenil [hydroxychloroquine sulfate], Azithromycin, Lactose intolerance (gi), and Penicillin g   Assessment / Plan:     Visit Diagnoses: SLE (systemic lupus erythematosus related syndrome) (HCC)  High risk medication use  Immune thrombocytopenia (HCC)  Lupus anticoagulant positive  TPMT intermediate metabolizer (HCC)  Elevated LFTs  Primary osteoarthritis of both hands  Status post total hip replacement, left  Primary osteoarthritis of right knee  S/P TKR (total knee replacement), left  Primary osteoarthritis of both feet  DDD (degenerative disc disease), lumbar  Vertebral artery dissection (HCC)  Hx of completed stroke  Pre-diabetes  Vitamin D deficiency  Primary hyperparathyroidism ([place])  Spasmodic dysphonia  Essential hypertension, benign  Orders: No orders of the defined types were placed in this encounter.  No orders of the defined types were placed in this encounter.   Face-to-face time spent with patient was *** minutes. Greater than 50% of time was spent in counseling and coordination of care.  Follow-Up Instructions: No follow-ups on file.   Ofilia Neas, PA-C  Note - This record has been created using Dragon software.  Chart creation errors have been sought, but may not always  have been located. Such creation errors do not reflect on  the standard of medical care.

## 2020-11-11 ENCOUNTER — Other Ambulatory Visit: Payer: Self-pay

## 2020-11-11 DIAGNOSIS — D693 Immune thrombocytopenic purpura: Secondary | ICD-10-CM

## 2020-11-12 ENCOUNTER — Other Ambulatory Visit: Payer: Self-pay

## 2020-11-12 ENCOUNTER — Inpatient Hospital Stay: Payer: Medicare HMO | Attending: Hematology

## 2020-11-12 DIAGNOSIS — D6862 Lupus anticoagulant syndrome: Secondary | ICD-10-CM | POA: Diagnosis not present

## 2020-11-12 DIAGNOSIS — D72819 Decreased white blood cell count, unspecified: Secondary | ICD-10-CM | POA: Insufficient documentation

## 2020-11-12 DIAGNOSIS — Z79899 Other long term (current) drug therapy: Secondary | ICD-10-CM | POA: Insufficient documentation

## 2020-11-12 DIAGNOSIS — Z8673 Personal history of transient ischemic attack (TIA), and cerebral infarction without residual deficits: Secondary | ICD-10-CM | POA: Diagnosis not present

## 2020-11-12 DIAGNOSIS — D693 Immune thrombocytopenic purpura: Secondary | ICD-10-CM

## 2020-11-12 LAB — CMP (CANCER CENTER ONLY)
ALT: 17 U/L (ref 0–44)
AST: 24 U/L (ref 15–41)
Albumin: 4.2 g/dL (ref 3.5–5.0)
Alkaline Phosphatase: 109 U/L (ref 38–126)
Anion gap: 11 (ref 5–15)
BUN: 12 mg/dL (ref 8–23)
CO2: 26 mmol/L (ref 22–32)
Calcium: 9.8 mg/dL (ref 8.9–10.3)
Chloride: 105 mmol/L (ref 98–111)
Creatinine: 0.88 mg/dL (ref 0.44–1.00)
GFR, Estimated: >60 mL/min (ref >60)
Glucose, Bld: 104 mg/dL — ABNORMAL HIGH (ref 70–99)
Potassium: 3.8 mmol/L (ref 3.5–5.1)
Sodium: 142 mmol/L (ref 135–145)
Total Bilirubin: 0.7 mg/dL (ref 0.3–1.2)
Total Protein: 7.8 g/dL (ref 6.5–8.1)

## 2020-11-12 LAB — CBC WITH DIFFERENTIAL (CANCER CENTER ONLY)
Abs Immature Granulocytes: 0 10*3/uL (ref 0.00–0.07)
Basophils Absolute: 0 10*3/uL (ref 0.0–0.1)
Basophils Relative: 0 %
Eosinophils Absolute: 0.1 10*3/uL (ref 0.0–0.5)
Eosinophils Relative: 3 %
HCT: 38.2 % (ref 36.0–46.0)
Hemoglobin: 13 g/dL (ref 12.0–15.0)
Immature Granulocytes: 0 %
Lymphocytes Relative: 45 %
Lymphs Abs: 1 10*3/uL (ref 0.7–4.0)
MCH: 30 pg (ref 26.0–34.0)
MCHC: 34 g/dL (ref 30.0–36.0)
MCV: 88.2 fL (ref 80.0–100.0)
Monocytes Absolute: 0.4 10*3/uL (ref 0.1–1.0)
Monocytes Relative: 17 %
Neutro Abs: 0.8 10*3/uL — ABNORMAL LOW (ref 1.7–7.7)
Neutrophils Relative %: 35 %
Platelet Count: 185 10*3/uL (ref 150–400)
RBC: 4.33 MIL/uL (ref 3.87–5.11)
RDW: 13.1 % (ref 11.5–15.5)
WBC Count: 2.3 10*3/uL — ABNORMAL LOW (ref 4.0–10.5)
nRBC: 0 % (ref 0.0–0.2)

## 2020-11-12 LAB — RETIC PANEL
Immature Retic Fract: 13.9 % (ref 2.3–15.9)
RBC.: 4.35 MIL/uL (ref 3.87–5.11)
Retic Count, Absolute: 51.3 10*3/uL (ref 19.0–186.0)
Retic Ct Pct: 1.2 % (ref 0.4–3.1)
Reticulocyte Hemoglobin: 33 pg (ref >27.9)

## 2020-11-13 ENCOUNTER — Inpatient Hospital Stay (HOSPITAL_BASED_OUTPATIENT_CLINIC_OR_DEPARTMENT_OTHER): Payer: Medicare HMO | Admitting: Hematology

## 2020-11-13 DIAGNOSIS — D693 Immune thrombocytopenic purpura: Secondary | ICD-10-CM | POA: Diagnosis not present

## 2020-11-13 DIAGNOSIS — D72819 Decreased white blood cell count, unspecified: Secondary | ICD-10-CM | POA: Diagnosis not present

## 2020-11-13 DIAGNOSIS — Z8673 Personal history of transient ischemic attack (TIA), and cerebral infarction without residual deficits: Secondary | ICD-10-CM | POA: Diagnosis not present

## 2020-11-13 DIAGNOSIS — D6862 Lupus anticoagulant syndrome: Secondary | ICD-10-CM | POA: Diagnosis not present

## 2020-11-13 DIAGNOSIS — Z79899 Other long term (current) drug therapy: Secondary | ICD-10-CM | POA: Diagnosis not present

## 2020-11-13 NOTE — Progress Notes (Signed)
HEMATOLOGY/ONCOLOGY CLINIC NOTE  Date of Service: .11/13/2020   Patient Care Team: Myrlene Broker, MD as PCP - General (Internal Medicine)  REFERRING PHYSICIAN: Myrlene Broker, *  CHIEF COMPLAINTS/PURPOSE OF CONSULTATION:  F/u for mx of ITP   INTERVAL HISTORY  Elaine Allen is a wonderful 65 y.o. female who is here today for a follow up for ITP after having completed her 4 weekly doses of Rituxan for relapsed ITP in the context of a history of SLE.  She notes no acute new symptoms.  Does have some joint pains and stiffness and grade 1 fatigue Has followed with Dr. Corliss Skains to discuss further treatment options for her SLE. We discussed that this would be a decision made by her rheumatologist in keeping with different treatment options which she feels would be the most effective and be tolerated without significant cytopenias.  Patient's labs today normal hemoglobin of 12, normal platelets of 188k WBC count of 2.6k with an ANC of 800. Patient notes no issues with bleeding or abnormal bruising. No fevers no chills no night sweats no unexpected weight loss. No other toxicities from her lost treatment with Rituxan.  MEDICAL HISTORY:  Past Medical History:  Diagnosis Date   Arthritis    Blind right eye    sees colors   Chronic anemia    Detached retina, right    corrected good for about 6 months sees peripheral colors only   History of kidney stones    Hypertension    Hyperthyroidism    Lupus (HCC)    Pneumonia    Retinal detachment    Spasmodic dysphonia    Stroke (HCC) 2015   no permanant defecits had weakness in left arm in hospital Had PT also   Vertigo    Hx of preceding stroke     SURGICAL HISTORY: Past Surgical History:  Procedure Laterality Date   ABDOMINAL HYSTERECTOMY     BREAST LUMPECTOMY     left breast benign   KNEE SURGERY Left    THYROID SURGERY     TOTAL HIP ARTHROPLASTY Left 06/27/2018   Procedure: TOTAL HIP ARTHROPLASTY  ANTERIOR APPROACH;  Surgeon: Gean Birchwood, MD;  Location: [facility];  Service: Orthopedics;  Laterality: Left;     SOCIAL HISTORY: Social History   Socioeconomic History   Marital status: Single    Spouse name: Not on file   Number of children: 2   Years of education: college   Highest education level: Not on file  Occupational History   Occupation: options  Tobacco Use   Smoking status: Never   Smokeless tobacco: Never  Vaping Use   Vaping Use: Never used  Substance and Sexual Activity   Alcohol use: No   Drug use: No   Sexual activity: Not Currently  Other Topics Concern   Not on file  Social History Narrative   Not on file   Social Determinants of Health   Financial Resource Strain: Low Risk    Difficulty of Paying Living Expenses: Not hard at all  Food Insecurity: No Food Insecurity   Worried About Programme researcher, broadcasting/film/video in the Last Year: Never true   Ran Out of Food in the Last Year: Never true  Transportation Needs: No Transportation Needs   Lack of Transportation (Medical): No   Lack of Transportation (Non-Medical): No  Physical Activity: Sufficiently Active   Days of Exercise per Week: 5 days   Minutes of Exercise per Session: 30 min  Stress:  No Stress Concern Present   Feeling of Stress : Not at all  Social Connections: Moderately Integrated   Frequency of Communication with Friends and Family: More than three times a week   Frequency of Social Gatherings with Friends and Family: More than three times a week   Attends Religious Services: More than 4 times per year   Active Member of [employer] or Organizations: Yes   Attends Music therapist: More than 4 times per year   Marital Status: Divorced  Human resources officer Violence: Not At Risk   Fear of Current or Ex-Partner: No   Emotionally Abused: No   Physically Abused: No   Sexually Abused: No     FAMILY HISTORY: Family History  Problem Relation Age of Onset   Cancer Mother        liver    Diabetes Mother    Arthritis Mother    Cancer Father        prostate,METS, stomach    Ovarian cancer Sister    Throat cancer Brother      ALLERGIES:   is allergic to plaquenil [hydroxychloroquine sulfate], azithromycin, lactose intolerance (gi), and penicillin g.   MEDICATIONS:  Current Outpatient Medications  Medication Sig Dispense Refill   diclofenac Sodium (VOLTAREN) 1 % GEL Apply 2 g topically 4 (four) times daily. 100 g 3   losartan-hydrochlorothiazide (HYZAAR) 100-25 MG tablet TAKE 1 TABLET BY MOUTH DAILY 30 tablet 5   potassium chloride SA (KLOR-CON) 20 MEQ tablet Take 1 tablet (20 mEq total) by mouth daily. (Patient not taking: No sig reported) 3 tablet 0   Probiotic Product (PROBIOTIC PO) Take 1 capsule by mouth daily.     vitamin B-12 (CYANOCOBALAMIN) 1000 MCG tablet Take 1,000 mcg by mouth daily.     No current facility-administered medications for this visit.    REVIEW OF SYSTEMS:   .10 Point review of Systems was done is negative except as noted above.   PHYSICAL EXAMINATION: ECOG PERFORMANCE STATUS: 1 - Symptomatic but completely ambulatory  There were no vitals filed for this visit.    There were no vitals filed for this visit.    There is no height or weight on file to calculate BMI.  Marland Kitchen GENERAL:alert, in no acute distress and comfortable SKIN: no acute rashes, no significant lesions EYES: conjunctiva are pink and non-injected, sclera anicteric OROPHARYNX: MMM, no exudates, no oropharyngeal erythema or ulceration NECK: supple, no JVD LYMPH:  no palpable lymphadenopathy in the cervical, axillary or inguinal regions LUNGS: clear to auscultation b/l with normal respiratory effort HEART: regular rate & rhythm ABDOMEN:  normoactive bowel sounds , non tender, not distended. Extremity: no pedal edema PSYCH: alert & oriented x 3 with fluent speech NEURO: no focal motor/sensory deficits    LABORATORY DATA:  I have reviewed the data as listed  CBC  Latest Ref Rng & Units 11/12/2020 09/16/2020 09/02/2020  WBC 4.0 - 10.5 K/uL 2.3(L) 2.6(L) 3.2(L)  Hemoglobin 12.0 - 15.0 g/dL 13.0 12.0 12.0  Hematocrit 36.0 - 46.0 % 38.2 36.5 35.8(L)  Platelets 150 - 400 K/uL 185 188 174  ANC 800  CMP Latest Ref Rng & Units 11/12/2020 09/16/2020 09/02/2020  Glucose 70 - 99 mg/dL 104(H) 100(H) 109(H)  BUN 8 - 23 mg/dL 12 10 14   Creatinine 0.44 - 1.00 mg/dL 0.88 0.84 0.83  Sodium 135 - 145 mmol/L 142 143 142  Potassium 3.5 - 5.1 mmol/L 3.8 3.3(L) 3.5  Chloride 98 - 111 mmol/L 105 108 107  CO2 22 - 32 mmol/L 26 27 25   Calcium 8.9 - 10.3 mg/dL 9.8 9.3 9.1  Total Protein 6.5 - 8.1 g/dL 7.8 6.9 6.8  Total Bilirubin 0.3 - 1.2 mg/dL 0.7 0.6 0.8  Alkaline Phos 38 - 126 U/L 109 86 75  AST 15 - 41 U/L 24 18 21   ALT 0 - 44 U/L 17 9 14    .  RADIOGRAPHIC STUDIES: I have personally reviewed the radiological images as listed and agreed with the findings in the report. No results found.    ASSESSMENT & PLAN:   Elaine Allen is a 65 y.o. female with:  1. Thrombocytopenia- -Likely related to Immune thrombocytopenia in the setting of lupus and previous use of Imuran. Patient was treated for her relapsed ITP with 4 doses of Rituxan again. -Labs from today show platelet count continues to be within normal limits at 188K.   2. Lupus - Imuran is currently on hold since hospitalization from 07/14/2020 as it was felt to contributing to thrombocytopenia. Patient is reporting diffuse joint pain and body aches today since medication has been on hold. Patient is scheduled for a follow up with Dr. Estanislado Pandy on 08/28/2020.   3. H/o AVN -  related to steroids s/p left THA.  4. Lupus anticoagulant positive-history of old stroke but that was related to vertebral artery dissection. -If platelets remain stable below 50k PCP/rheumatology could consider starting our baby aspirin 81 mg p.o. daily with monitoring.  5.  Leukopenia/neutropenia -likely autoimmune related to  lupus.  Less likely from Rituxan. Will monitor PLAN -Patient is doing well and has no toxicities from her recent treatment with weekly Rituxan x4. -Labs today show normal platelet counts of 188k -Patient has had leukopenia today the WBC count of 2.6k and ANC of 800  -likely autoimmune neutropenia from her SLE less likely from Rituxan. -No indication for additional treatment of the patient's acute on chronic ITP at this time. -Follow-up with Dr. Deveshwar/rheumatology to determine appropriate next treatment for her SLE to try to control her disease.  Hopefully this treatment will also control her ITP for a while. Consideration of for CellCept was discussed with Dr. Estanislado Pandy. -Patient was recommended to get her yearly flu shot and new COVID-19 booster vaccine.  FOLLOW UP Phone visit in 2 months Please schedule labs 1 day prior to phone visit   All of the patient's questions were answered with apparent satisfaction. The patient knows to call the clinic with any problems, questions or concerns.  . The total time spent in the appointment was 30 minutes and more than 50% was on counseling and direct patient cares, coordination of care with her rheumatologist.  [name], MD Hematology and [place] at [facility]

## 2020-11-14 ENCOUNTER — Telehealth: Payer: Self-pay | Admitting: Hematology

## 2020-11-14 NOTE — Telephone Encounter (Signed)
Left message with follow-up appointment per 1/19 los. °

## 2020-11-19 ENCOUNTER — Encounter: Payer: Self-pay | Admitting: Hematology

## 2020-11-20 ENCOUNTER — Ambulatory Visit: Payer: Medicare HMO | Admitting: Physician Assistant

## 2020-11-20 DIAGNOSIS — E559 Vitamin D deficiency, unspecified: Secondary | ICD-10-CM

## 2020-11-20 DIAGNOSIS — I7774 Dissection of vertebral artery: Secondary | ICD-10-CM

## 2020-11-20 DIAGNOSIS — M19071 Primary osteoarthritis, right ankle and foot: Secondary | ICD-10-CM

## 2020-11-20 DIAGNOSIS — Z79899 Other long term (current) drug therapy: Secondary | ICD-10-CM

## 2020-11-20 DIAGNOSIS — M5136 Other intervertebral disc degeneration, lumbar region: Secondary | ICD-10-CM

## 2020-11-20 DIAGNOSIS — R760 Raised antibody titer: Secondary | ICD-10-CM

## 2020-11-20 DIAGNOSIS — R7989 Other specified abnormal findings of blood chemistry: Secondary | ICD-10-CM

## 2020-11-20 DIAGNOSIS — M329 Systemic lupus erythematosus, unspecified: Secondary | ICD-10-CM

## 2020-11-20 DIAGNOSIS — E8889 Other specified metabolic disorders: Secondary | ICD-10-CM

## 2020-11-20 DIAGNOSIS — M1711 Unilateral primary osteoarthritis, right knee: Secondary | ICD-10-CM

## 2020-11-20 DIAGNOSIS — I10 Essential (primary) hypertension: Secondary | ICD-10-CM

## 2020-11-20 DIAGNOSIS — M19042 Primary osteoarthritis, left hand: Secondary | ICD-10-CM

## 2020-11-20 DIAGNOSIS — Z96642 Presence of left artificial hip joint: Secondary | ICD-10-CM

## 2020-11-20 DIAGNOSIS — E210 Primary hyperparathyroidism: Secondary | ICD-10-CM

## 2020-11-20 DIAGNOSIS — Z96652 Presence of left artificial knee joint: Secondary | ICD-10-CM

## 2020-11-20 DIAGNOSIS — Z8673 Personal history of transient ischemic attack (TIA), and cerebral infarction without residual deficits: Secondary | ICD-10-CM

## 2020-11-20 DIAGNOSIS — R7303 Prediabetes: Secondary | ICD-10-CM

## 2020-11-20 DIAGNOSIS — D693 Immune thrombocytopenic purpura: Secondary | ICD-10-CM

## 2020-11-20 DIAGNOSIS — J383 Other diseases of vocal cords: Secondary | ICD-10-CM

## 2020-12-16 ENCOUNTER — Telehealth: Payer: Self-pay | Admitting: *Deleted

## 2020-12-16 NOTE — Chronic Care Management (AMB) (Signed)
  Chronic Care Management   Outreach Note  12/16/2020 Name: Elaine Allen MRN: [id number] DOB: 1955/08/09  Elaine Allen is a 65 y.o. year old female who is a primary care patient of Myrlene Broker, MD. I reached out to [name] by phone today in response to a referral sent by [name]'s primary care provider.  An unsuccessful telephone outreach was attempted today. The patient was referred to the case management team for assistance with care management and care coordination.   Follow Up Plan: A HIPAA compliant phone message was left for the patient providing contact information and requesting a return call.  The care management team will reach out to the patient again over the next 7 days.  If patient returns call to provider office, please advise to call Embedded Care Management Care Guide [name]* at [phone]  Care Guide, Embedded Care Coordination [facility]  Care Management  Direct Dial: [phone]

## 2020-12-16 NOTE — Chronic Care Management (AMB) (Signed)
  Chronic Care Management   Note  12/16/2020 Name: Elaine Allen MRN: [id number] DOB: 06/18/1955  Elaine Allen is a 65 y.o. year old female who is a primary care patient of Hoyt Koch, MD. I reached out to [place] by phone today in response to a referral sent by [name] PCP.  [name] was given information about Chronic Care Management services today including:  CCM service includes personalized support from designated clinical staff supervised by her physician, including individualized plan of care and coordination with other care providers 24/7 contact phone numbers for assistance for urgent and routine care needs. Service will only be billed when office clinical staff spend 20 minutes or more in a month to coordinate care. Only one practitioner may furnish and bill the service in a calendar month. The patient may stop CCM services at any time (effective at the end of the month) by phone call to the office staff. The patient is responsible for co-pay (up to 20% after annual deductible is met) if co-pay is required by the individual health plan.   Patient agreed to services and verbal consent obtained.   Follow up plan: Telephone appointment with care management team member scheduled for:12/24/20  [name]: [phone]

## 2020-12-24 ENCOUNTER — Telehealth: Payer: Medicare HMO

## 2020-12-24 ENCOUNTER — Encounter: Payer: Self-pay | Admitting: *Deleted

## 2020-12-24 ENCOUNTER — Telehealth: Payer: Self-pay | Admitting: *Deleted

## 2020-12-24 NOTE — Telephone Encounter (Signed)
°  Chronic Care Management   Follow Up Note   12/24/2020 Name: Elaine Allen MRN: [id number] DOB: 1955/06/16  Referred by: Myrlene Broker, MD Reason for referral : Chronic Care Management (CCM RN CM Initial Outreach attempt- Unsuccessful attempt)  An unsuccessful telephone outreach was attempted today. The patient was referred to the case management team for assistance with care management and care coordination.   Follow Up Plan:  A HIPPA compliant phone message was left for the patient providing contact information and requesting a return call Will place request with scheduling care guide to contact patient to re-schedule today's missed CCM RN initial telephone appointment if I do not hear back from patient by end of day  Caryl Pina, RN, BSN, CCRN Alumnus CCM Clinic RN Care Coordination- [facility] [place] [phone]: direct office

## 2021-01-02 ENCOUNTER — Telehealth: Payer: Self-pay | Admitting: *Deleted

## 2021-01-02 NOTE — Chronic Care Management (AMB) (Signed)
°  Care Management   Note  01/02/2021 Name: Amyjo Mizrachi MRN: [id number] DOB: Jul 14, 1955  Braden Deloach Casciano is a 65 y.o. year old female who is a primary care patient of Myrlene Broker, MD and is actively engaged with the care management team. I reached out to Ranae Plumber Foiles by phone today to assist with re-scheduling an initial visit with the RN Case Manager  Follow up plan: Unsuccessful telephone outreach attempt made. A HIPAA compliant phone message was left for the patient providing contact information and requesting a return call.  The care management team will reach out to the patient again over the next 7 days.  If patient returns call to provider office, please advise to call Embedded Care Management Care Guide [name] at [phone].  [name]  Care Guide, Embedded Care Coordination [facility] Management  Direct Dial: [phone]

## 2021-01-09 NOTE — Chronic Care Management (AMB) (Signed)
°  Care Management   Note  01/09/2021 Name: Keaghan Bowens MRN: [id number] DOB: 1955-03-28  Ranae Plumber Brun is a 66 y.o. year old female who is a primary care patient of Myrlene Broker, MD and is actively engaged with the care management team. I reached out to Ranae Plumber Koslow by phone today to assist with re-scheduling an initial visit with the RN Case Manager  Follow up plan: Telephone appointment with care management team member scheduled for:02/03/21  [facility] Guide, Embedded Care Coordination [facility]   Care Management  Direct Dial: [phone]

## 2021-01-27 ENCOUNTER — Encounter: Payer: Self-pay | Admitting: Hematology

## 2021-02-03 ENCOUNTER — Telehealth: Payer: Medicare HMO

## 2021-02-11 ENCOUNTER — Other Ambulatory Visit: Payer: Self-pay

## 2021-02-11 DIAGNOSIS — D693 Immune thrombocytopenic purpura: Secondary | ICD-10-CM

## 2021-02-12 ENCOUNTER — Inpatient Hospital Stay: Payer: Medicare Other | Attending: Hematology | Admitting: Hematology

## 2021-02-12 ENCOUNTER — Other Ambulatory Visit: Payer: Self-pay

## 2021-02-12 ENCOUNTER — Inpatient Hospital Stay: Payer: Medicare Other

## 2021-02-12 VITALS — BP 134/83 | HR 96 | Temp 98.1°F | Resp 20 | Wt 184.5 lb

## 2021-02-12 DIAGNOSIS — D693 Immune thrombocytopenic purpura: Secondary | ICD-10-CM

## 2021-02-12 LAB — CBC WITH DIFFERENTIAL (CANCER CENTER ONLY)
Abs Immature Granulocytes: 0 10*3/uL (ref 0.00–0.07)
Basophils Absolute: 0 10*3/uL (ref 0.0–0.1)
Basophils Relative: 0 %
Eosinophils Absolute: 0.1 10*3/uL (ref 0.0–0.5)
Eosinophils Relative: 3 %
HCT: 36.4 % (ref 36.0–46.0)
Hemoglobin: 12.4 g/dL (ref 12.0–15.0)
Immature Granulocytes: 0 %
Lymphocytes Relative: 38 %
Lymphs Abs: 0.9 10*3/uL (ref 0.7–4.0)
MCH: 30 pg (ref 26.0–34.0)
MCHC: 34.1 g/dL (ref 30.0–36.0)
MCV: 88.1 fL (ref 80.0–100.0)
Monocytes Absolute: 0.4 10*3/uL (ref 0.1–1.0)
Monocytes Relative: 17 %
Neutro Abs: 1 10*3/uL — ABNORMAL LOW (ref 1.7–7.7)
Neutrophils Relative %: 42 %
Platelet Count: 191 10*3/uL (ref 150–400)
RBC: 4.13 MIL/uL (ref 3.87–5.11)
RDW: 13.5 % (ref 11.5–15.5)
WBC Count: 2.4 10*3/uL — ABNORMAL LOW (ref 4.0–10.5)
nRBC: 0 % (ref 0.0–0.2)

## 2021-02-12 LAB — RETIC PANEL
Immature Retic Fract: 16.5 % — ABNORMAL HIGH (ref 2.3–15.9)
RBC.: 4.16 MIL/uL (ref 3.87–5.11)
Retic Count, Absolute: 62.4 10*3/uL (ref 19.0–186.0)
Retic Ct Pct: 1.5 % (ref 0.4–3.1)
Reticulocyte Hemoglobin: 32.3 pg (ref >27.9)

## 2021-02-12 LAB — CMP (CANCER CENTER ONLY)
ALT: 23 U/L (ref 0–44)
AST: 31 U/L (ref 15–41)
Albumin: 4.3 g/dL (ref 3.5–5.0)
Alkaline Phosphatase: 91 U/L (ref 38–126)
Anion gap: 6 (ref 5–15)
BUN: 19 mg/dL (ref 8–23)
CO2: 28 mmol/L (ref 22–32)
Calcium: 9.2 mg/dL (ref 8.9–10.3)
Chloride: 107 mmol/L (ref 98–111)
Creatinine: 0.84 mg/dL (ref 0.44–1.00)
GFR, Estimated: >60 mL/min (ref >60)
Glucose, Bld: 97 mg/dL (ref 70–99)
Potassium: 3.7 mmol/L (ref 3.5–5.1)
Sodium: 141 mmol/L (ref 135–145)
Total Bilirubin: 0.7 mg/dL (ref 0.3–1.2)
Total Protein: 7.6 g/dL (ref 6.5–8.1)

## 2021-02-14 ENCOUNTER — Telehealth: Payer: Self-pay | Admitting: *Deleted

## 2021-02-14 NOTE — Chronic Care Management (AMB) (Signed)
°  Care Management   Note  02/14/2021 Name: Elaine Allen MRN: [id number] DOB: 1955/07/01  [name] is a 66 y.o. year old female who is a primary care patient of Myrlene Broker, MD and is actively engaged with the care management team. I reached out to Ranae Plumber Walgren by phone today to assist with re-scheduling an initial visit with the RN Case Manager  Follow up plan: Unsuccessful telephone outreach attempt made. A HIPAA compliant phone message was left for the patient providing contact information and requesting a return call.  The care management team will reach out to the patient again over the next 7 days.  If patient returns call to provider office, please advise to call Embedded Care Management Care Guide [name]  at [phone].  [name]  Care Guide, Embedded Care Coordination [facility] Management  Direct Dial: [phone]

## 2021-02-14 NOTE — Progress Notes (Unsigned)
Office Visit Note  Patient: Elaine Allen             Date of Birth: Jan 12, 1955           MRN: [id number]             PCP: Hoyt Koch, MD Referring: Hoyt Koch, * Visit Date: 02/27/2021 Occupation: @GUAROCC @  Subjective:  No chief complaint on file.   History of Present Illness: Elaine Allen is a 66 y.o. female ***   Activities of Daily Living:  Patient reports morning stiffness for *** {minute/hour:19697}.   Patient {ACTIONS;DENIES/REPORTS:21021675::"Denies"} nocturnal pain.  Difficulty dressing/grooming: {ACTIONS;DENIES/REPORTS:21021675::"Denies"} Difficulty climbing stairs: {ACTIONS;DENIES/REPORTS:21021675::"Denies"} Difficulty getting out of chair: {ACTIONS;DENIES/REPORTS:21021675::"Denies"} Difficulty using hands for taps, buttons, cutlery, and/or writing: {ACTIONS;DENIES/REPORTS:21021675::"Denies"}  No Rheumatology ROS completed.   PMFS History:  Patient Active Problem List   Diagnosis Date Noted   Leg swelling 09/19/2020   Thrombocytopenia ([place]) 07/12/2020   Hypokalemia 07/12/2020   Neutropenia ([place]) 07/12/2020   Routine general medical examination at a health care facility 03/15/2020   Immune thrombocytopenia ([place]) 03/26/2019   AVN of femur ([place]) 06/23/2018   Greater trochanteric bursitis of left hip 04/08/2018   Left knee pain 04/06/2018   Numbness in feet 09/14/2017   Elevated LFTs 02/24/2017   Pre-diabetes 02/19/2017   Fatigue 02/19/2017   OA (osteoarthritis) of hip 10/29/2016   Numbness and tingling in left arm 05/05/2016   Cough 12/25/2015   Wheezing 12/25/2015   Abdominal pain 12/25/2015   Back pain 10/01/2015   Insomnia 03/01/2015   Family hx of colon cancer 11/12/2014   Essential hypertension, benign 05/15/2013   Other and unspecified hyperlipidemia 05/15/2013   Vertebral artery dissection ([place]) 05/14/2013   Hx of completed stroke 05/14/2013   Chronic anemia    Primary osteoarthritis of right knee 12/19/2007    Primary hyperparathyroidism ([place]) 11/24/2007   Osteopenia 11/24/2007   Lupus (systemic lupus erythematosus) ([place]) 01/05/2002    Past Medical History:  Diagnosis Date   Arthritis    Blind right eye    sees colors   Chronic anemia    Detached retina, right    corrected good for about 6 months sees peripheral colors only   History of kidney stones    Hypertension    Hyperthyroidism    Lupus ([place])    Pneumonia    Retinal detachment    Spasmodic dysphonia    Stroke ([place]) 2015   no permanant defecits had weakness in left arm in hospital Had PT also   Vertigo    Hx of preceding stroke    Family History  Problem Relation Age of Onset   Cancer Mother        liver   Diabetes Mother    Arthritis Mother    Cancer Father        prostate,METS, stomach    Ovarian cancer Sister    Throat cancer Brother    Past Surgical History:  Procedure Laterality Date   ABDOMINAL HYSTERECTOMY     BREAST LUMPECTOMY     left breast benign   KNEE SURGERY Left    THYROID SURGERY     TOTAL HIP ARTHROPLASTY Left 06/27/2018   Procedure: TOTAL HIP ARTHROPLASTY ANTERIOR APPROACH;  Surgeon: Frederik Pear, MD;  Location: [facility];  Service: Orthopedics;  Laterality: Left;   Social History   Social History Narrative   Not on file   Immunization History  Administered Date(s) Administered   Fluad Quad(high Dose 65+) 10/21/2018, 09/17/2020  Hep A / Hep B 03/08/2017   Influenza Whole 12/19/2007   Influenza,inj,Quad PF,6+ Mos 11/12/2014, 10/01/2015, 10/28/2016, 02/09/2018   Influenza-Unspecified 11/15/2013   PFIZER(Purple Top)SARS-COV-2 Vaccination 06/06/2019, 07/06/2019   PPD Test 09/12/2013, 01/09/2015, 03/18/2016, 03/23/2016, 08/10/2017   Pneumococcal Conjugate-13 10/21/2018   Pneumococcal Polysaccharide-23 03/14/2020   Tdap 12/05/2014   Zoster Recombinat (Shingrix) 06/24/2020   Zoster, Live 01/02/2015     Objective: Vital Signs: There were no vitals taken for this visit.   Physical Exam    Musculoskeletal Exam: ***  CDAI Exam: CDAI Score: -- Patient Global: --; Provider Global: -- Swollen: --; Tender: -- Joint Exam 02/27/2021   No joint exam has been documented for this visit   There is currently no information documented on the homunculus. Go to the Rheumatology activity and complete the homunculus joint exam.  Investigation: No additional findings.  Imaging: No results found.  Recent Labs: Lab Results  Component Value Date   WBC 2.4 (L) 02/12/2021   HGB 12.4 02/12/2021   PLT 191 02/12/2021   NA 141 02/12/2021   K 3.7 02/12/2021   CL 107 02/12/2021   CO2 28 02/12/2021   GLUCOSE 97 02/12/2021   BUN 19 02/12/2021   CREATININE 0.84 02/12/2021   BILITOT 0.7 02/12/2021   ALKPHOS 91 02/12/2021   AST 31 02/12/2021   ALT 23 02/12/2021   PROT 7.6 02/12/2021   ALBUMIN 4.3 02/12/2021   CALCIUM 9.2 02/12/2021   GFRAA 85 07/11/2020   QFTBGOLDPLUS NEGATIVE 01/31/2020    Speciality Comments: PLQ-blisters all over the body  Procedures:  No procedures performed Allergies: Plaquenil [hydroxychloroquine sulfate], Azithromycin, Lactose intolerance (gi), and Penicillin g   Assessment / Plan:     Visit Diagnoses: No diagnosis found.  Orders: No orders of the defined types were placed in this encounter.  No orders of the defined types were placed in this encounter.   Face-to-face time spent with patient was *** minutes. Greater than 50% of time was spent in counseling and coordination of care.  Follow-Up Instructions: No follow-ups on file.   Earnestine Mealing, CMA  Note - This record has been created using Editor, commissioning.  Chart creation errors have been sought, but may not always  have been located. Such creation errors do not reflect on  the standard of medical care.

## 2021-02-17 NOTE — Chronic Care Management (AMB) (Signed)
°  Care Management   Note  02/17/2021 Name: Taleia Sadowski MRN: [id number] DOB: [birth year]  Elaine Allen is a 66 y.o. year old female who is a primary care patient of Myrlene Broker, MD and is actively engaged with the care management team. I reached out to Ranae Plumber Sine by phone today to assist with re-scheduling an initial visit with the RN Case Manager  Follow up plan: Unsuccessful telephone outreach attempt made. A HIPAA compliant phone message was left for the patient providing contact information and requesting a return call. The care management team will reach out to the patient again over the next 7 days.  If patient returns call to provider office, please advise to call Embedded Care Management Care Guide [name] at [phone]  [facility] Guide, Embedded Care Coordination [facility] Health   Care Management  Direct Dial: [phone]

## 2021-02-18 ENCOUNTER — Encounter: Payer: Self-pay | Admitting: Hematology

## 2021-02-18 NOTE — Progress Notes (Addendum)
HEMATOLOGY/ONCOLOGY CLINIC NOTE  Date of Service: .02/12/2021   Patient Care Team: Myrlene Broker, MD as PCP - General (Internal Medicine) Michaela Corner, RN as Case Manager  REFERRING PHYSICIAN: Myrlene Broker, *  CHIEF COMPLAINTS/PURPOSE OF CONSULTATION:  Follow-up for continued evaluation and management of ITP   INTERVAL HISTORY  Elaine Allen is here for continued evaluation and management of her ITP. She notes no acute new symptoms since her last clinic visit with [place] on 11/13/2020. She notes that she has not been started on any new medications for her rheumatoid arthritis since her last clinic visit. Notes some mild fatigue and intermittent arthralgias.  Labs today show normal platelets of 191k normal hemoglobin of 12.4 continued chronic leukopenia with a WBC count of 2.4k with an ANC of 1000. We discussed that her leukopenia appears to be autoimmune at this time. No infection issues since her last clinic visit.   MEDICAL HISTORY:  Past Medical History:  Diagnosis Date   Arthritis    Blind right eye    sees colors   Chronic anemia    Detached retina, right    corrected good for about 6 months sees peripheral colors only   History of kidney stones    Hypertension    Hyperthyroidism    Lupus ([facility])    Pneumonia    Retinal detachment    Spasmodic dysphonia    Stroke ([facility]) 2015   no permanant defecits had weakness in left arm in hospital Had PT also   Vertigo    Hx of preceding stroke     SURGICAL HISTORY: Past Surgical History:  Procedure Laterality Date   ABDOMINAL HYSTERECTOMY     BREAST LUMPECTOMY     left breast benign   KNEE SURGERY Left    THYROID SURGERY     TOTAL HIP ARTHROPLASTY Left 06/27/2018   Procedure: TOTAL HIP ARTHROPLASTY ANTERIOR APPROACH;  Surgeon: Gean Birchwood, MD;  Location: [facility];  Service: Orthopedics;  Laterality: Left;     SOCIAL HISTORY: Social History   Socioeconomic History   Marital status:  Single    Spouse name: Not on file   Number of children: 2   Years of education: college   Highest education level: Not on file  Occupational History   Occupation: options  Tobacco Use   Smoking status: Never   Smokeless tobacco: Never  Vaping Use   Vaping Use: Never used  Substance and Sexual Activity   Alcohol use: No   Drug use: No   Sexual activity: Not Currently  Other Topics Concern   Not on file  Social History Narrative   Not on file   Social Determinants of Health   Financial Resource Strain: Low Risk    Difficulty of Paying Living Expenses: Not hard at all  Food Insecurity: No Food Insecurity   Worried About Programme researcher, broadcasting/film/video in the Last Year: Never true   Ran Out of Food in the Last Year: Never true  Transportation Needs: No Transportation Needs   Lack of Transportation (Medical): No   Lack of Transportation (Non-Medical): No  Physical Activity: Sufficiently Active   Days of Exercise per Week: 5 days   Minutes of Exercise per Session: 30 min  Stress: No Stress Concern Present   Feeling of Stress : Not at all  Social Connections: Moderately Integrated   Frequency of Communication with Friends and Family: More than three times a week   Frequency of Social Gatherings with  Friends and Family: More than three times a week   Attends Religious Services: More than 4 times per year   Active Member of Clubs or Organizations: Yes   Attends Music therapist: More than 4 times per year   Marital Status: Divorced  Human resources officer Violence: Not At Risk   Fear of Current or Ex-Partner: No   Emotionally Abused: No   Physically Abused: No   Sexually Abused: No     FAMILY HISTORY: Family History  Problem Relation Age of Onset   Cancer Mother        liver   Diabetes Mother    Arthritis Mother    Cancer Father        prostate,METS, stomach    Ovarian cancer Sister    Throat cancer Brother      ALLERGIES:   is allergic to plaquenil  [hydroxychloroquine sulfate], azithromycin, lactose intolerance (gi), and penicillin g.   MEDICATIONS:  Current Outpatient Medications  Medication Sig Dispense Refill   diclofenac Sodium (VOLTAREN) 1 % GEL Apply 2 g topically 4 (four) times daily. 100 g 3   losartan-hydrochlorothiazide (HYZAAR) 100-25 MG tablet TAKE 1 TABLET BY MOUTH DAILY 30 tablet 5   potassium chloride SA (KLOR-CON) 20 MEQ tablet Take 1 tablet (20 mEq total) by mouth daily. (Patient not taking: No sig reported) 3 tablet 0   Probiotic Product (PROBIOTIC PO) Take 1 capsule by mouth daily.     vitamin B-12 (CYANOCOBALAMIN) 1000 MCG tablet Take 1,000 mcg by mouth daily.     No current facility-administered medications for this visit.    REVIEW OF SYSTEMS:    10 Point review of Systems was done is negative except as noted above.   PHYSICAL EXAMINATION: ECOG PERFORMANCE STATUS: 1 - Symptomatic but completely ambulatory  Vitals:   02/12/21 1440  BP: 134/83  Pulse: 96  Resp: 20  Temp: 98.1 F (36.7 C)  SpO2: 100%   Filed Weights   02/12/21 1440  Weight: 184 lb 8 oz (83.7 kg)  Body mass index is 32.68 kg/m.   GENERAL:alert, in no acute distress and comfortable SKIN: no acute rashes, no significant lesions EYES: conjunctiva are pink and non-injected, sclera anicteric OROPHARYNX: MMM, no exudates, no oropharyngeal erythema or ulceration NECK: supple, no JVD LYMPH:  no palpable lymphadenopathy in the cervical, axillary or inguinal regions LUNGS: clear to auscultation b/l with normal respiratory effort HEART: regular rate & rhythm ABDOMEN:  normoactive bowel sounds , non tender, not distended. Extremity: no pedal edema PSYCH: alert & oriented x 3 with fluent speech NEURO: no focal motor/sensory deficits   LABORATORY DATA:  I have reviewed the data as listed  CBC Latest Ref Rng & Units 02/12/2021 11/12/2020 09/16/2020  WBC 4.0 - 10.5 K/uL 2.4(L) 2.3(L) 2.6(L)  Hemoglobin 12.0 - 15.0 g/dL 12.4 13.0 12.0   Hematocrit 36.0 - 46.0 % 36.4 38.2 36.5  Platelets 150 - 400 K/uL 191 185 188  ANC 800  CMP Latest Ref Rng & Units 02/12/2021 11/12/2020 09/16/2020  Glucose 70 - 99 mg/dL 97 104(H) 100(H)  BUN 8 - 23 mg/dL 19 12 10   Creatinine 0.44 - 1.00 mg/dL 0.84 0.88 0.84  Sodium 135 - 145 mmol/L 141 142 143  Potassium 3.5 - 5.1 mmol/L 3.7 3.8 3.3(L)  Chloride 98 - 111 mmol/L 107 105 108  CO2 22 - 32 mmol/L 28 26 27   Calcium 8.9 - 10.3 mg/dL 9.2 9.8 9.3  Total Protein 6.5 - 8.1 g/dL 7.6 7.8 6.9  Total Bilirubin 0.3 - 1.2 mg/dL 0.7 0.7 0.6  Alkaline Phos 38 - 126 U/L 91 109 86  AST 15 - 41 U/L 31 24 18   ALT 0 - 44 U/L 23 17 9    RADIOGRAPHIC STUDIES: I have personally reviewed the radiological images as listed and agreed with the findings in the report. No results found.  ASSESSMENT & PLAN:   Sharrel Frock is a 66 y.o. female with:  1.  Relapsing ITP due to lupus -Likely related to Immune thrombocytopenia in the setting of lupus. Patient was treated for her relapsed ITP with 4 doses of Rituxan again. PLAN -Labs today show normal platelet count and no evidence of relapse of her ITP at this time.  2. Lupus - -Was previously on Imuran.  Currently not on any medications PLan -Patient to continue follow-up with Dr. Estanislado Pandy her rheumatologist for continued optimization of her lupus treatment  3. H/o AVN -  related to steroids s/p left THA.  4. Lupus anticoagulant positive-history of old stroke but that was related to vertebral artery dissection. -Reasonable to consider baby aspirin 81 mg p.o. daily.  Will defer this to her primary care physician.  5.  Leukopenia/neutropenia -likely autoimmune related to lupus.  Will monitor PLAN -Labs were discussed with the patient.  No evidence of ITP relapse at this time with the patient's platelets being completely normal at 191k -No indication for additional treatment of the patient's ITP at this time. -Optimization of underlying lupus  treatment will likely reduce her risk of recurrent relapses of her ITP. -She was encouraged to continue follow-up with Dr. Estanislado Pandy for continued management of her lupus from a rheumatology perspective.  FOLLOW UP Phone visit in 10 weeks with Dr Irene Limbo  Labs 1 day prior to clinic visit   All of the patient's questions were answered with apparent satisfaction. The patient knows to call the clinic with any problems, questions or concerns.  [name], MD Hematology and [place] at [facility]

## 2021-02-24 NOTE — Chronic Care Management (AMB) (Signed)
°  Care Management   Note  02/24/2021 Name: Elaine Allen MRN: [id number] DOB: Oct 31, 1955  [name] is a 66 y.o. year old female who is a primary care patient of Myrlene Broker, MD and is actively engaged with the care management team. I reached out to Ranae Plumber Albino by phone today to assist with re-scheduling an initial visit with the RN Case Manager  Follow up plan: A third unsuccessful telephone outreach attempt made. A HIPAA compliant phone message was left for the patient providing contact information and requesting a return call. Unable to make contact on outreach attempts x 3. PCP Dr Okey Dupre notified via routed documentation in medical record. We have been unable to make contact with the patient for follow up. The care management team is available to follow up with the patient after provider conversation with the patient regarding recommendation for care management engagement and subsequent re-referral to the care management team.   [facility] Guide, Embedded Care Coordination [facility]   Care Management  Direct Dial: [phone]

## 2021-02-25 ENCOUNTER — Telehealth: Payer: Self-pay | Admitting: *Deleted

## 2021-02-25 NOTE — Chronic Care Management (AMB) (Signed)
°  Care Management   Note  02/25/2021 Name: Elaine Allen MRN: [id number] DOB: 1955-03-13  Elaine Allen is a 66 y.o. year old female who is a primary care patient of Myrlene Broker, MD and is actively engaged with the care management team. I reached out to Ranae Plumber [place] by phone today to assist with re-scheduling an initial visit with the RN Case Manager  Follow up plan: Telephone appointment with care management team member scheduled for: 03/03/21  [name]  Care Guide, Embedded Care Coordination [facility]   Care Management  Direct Dial: [phone]

## 2021-02-27 ENCOUNTER — Ambulatory Visit: Payer: Medicare Other | Admitting: Physician Assistant

## 2021-02-27 DIAGNOSIS — D693 Immune thrombocytopenic purpura: Secondary | ICD-10-CM

## 2021-02-27 DIAGNOSIS — M19071 Primary osteoarthritis, right ankle and foot: Secondary | ICD-10-CM

## 2021-02-27 DIAGNOSIS — M329 Systemic lupus erythematosus, unspecified: Secondary | ICD-10-CM

## 2021-02-27 DIAGNOSIS — E8889 Other specified metabolic disorders: Secondary | ICD-10-CM

## 2021-02-27 DIAGNOSIS — M19041 Primary osteoarthritis, right hand: Secondary | ICD-10-CM

## 2021-02-27 DIAGNOSIS — Z79899 Other long term (current) drug therapy: Secondary | ICD-10-CM

## 2021-02-27 DIAGNOSIS — E559 Vitamin D deficiency, unspecified: Secondary | ICD-10-CM

## 2021-02-27 DIAGNOSIS — I7774 Dissection of vertebral artery: Secondary | ICD-10-CM

## 2021-02-27 DIAGNOSIS — E210 Primary hyperparathyroidism: Secondary | ICD-10-CM

## 2021-02-27 DIAGNOSIS — M1711 Unilateral primary osteoarthritis, right knee: Secondary | ICD-10-CM

## 2021-02-27 DIAGNOSIS — R760 Raised antibody titer: Secondary | ICD-10-CM

## 2021-02-27 DIAGNOSIS — R7303 Prediabetes: Secondary | ICD-10-CM

## 2021-02-27 DIAGNOSIS — J383 Other diseases of vocal cords: Secondary | ICD-10-CM

## 2021-02-27 DIAGNOSIS — M5136 Other intervertebral disc degeneration, lumbar region: Secondary | ICD-10-CM

## 2021-02-27 DIAGNOSIS — R7989 Other specified abnormal findings of blood chemistry: Secondary | ICD-10-CM

## 2021-02-27 DIAGNOSIS — I10 Essential (primary) hypertension: Secondary | ICD-10-CM

## 2021-02-27 DIAGNOSIS — Z8673 Personal history of transient ischemic attack (TIA), and cerebral infarction without residual deficits: Secondary | ICD-10-CM

## 2021-02-27 DIAGNOSIS — Z96642 Presence of left artificial hip joint: Secondary | ICD-10-CM

## 2021-02-28 NOTE — Progress Notes (Signed)
Office Visit Note  Patient: Elaine Allen             Date of Birth: March 22, 1955           MRN: [id number]             PCP: Hoyt Koch, MD Referring: Hoyt Koch, * Visit Date: 03/03/2021 Occupation: @GUAROCC @  Subjective:  Discuss medication options   History of Present Illness: Jovina Mompremier is a 66 y.o. female with history of SLE and osteoarthritis.  She is not currently taking any immunosuppressive agents.  Her last Rituxan infusion was in November 2022.  Her most recent platelet count was 191 on 02/12/2021.  She had a follow-up visit scheduled with Dr. Irene Limbo on 02/13/2020 who recommended following up with [place] to discuss treatment options to manage her symptoms of lupus.  Over the past several weeks she has been experiencing increased joint pain, stiffness, and swelling.  She has felt very restless at night since she has been unable to get comfortable.  She has been having an aching sensation in both legs and has had increased pain in both hands and both shoulder joints.  She has tried using Voltaren gel topically as needed which provides temporary relief.  She has also tried sitting in a warm bath.  She is feeling very discouraged since she has been unable to perform some of the activities that she would like to due to the arthralgias and fatigue.  She has also had intermittent symptoms of Raynaud's.  She denies any recent rashes.  She has some eye dryness but has not had any mouth dryness.  She denies any oral or nasal ulcerations.  She has not had any swollen lymph nodes.  She denies any shortness of breath, pleuritic chest pain, or palpitations.   Activities of Daily Living:  Patient reports morning stiffness for all day. Patient Reports nocturnal pain.  Difficulty dressing/grooming: Denies Difficulty climbing stairs: Reports Difficulty getting out of chair: Reports Difficulty using hands for taps, buttons, cutlery, and/or writing: Reports  Review of  Systems  Constitutional:  Positive for fatigue.  HENT:  Negative for mouth sores, mouth dryness and nose dryness.   Eyes:  Positive for dryness. Negative for pain and itching.  Respiratory:  Negative for shortness of breath and difficulty breathing.   Cardiovascular:  Negative for chest pain and palpitations.  Gastrointestinal:  Negative for blood in stool, constipation and diarrhea.  Endocrine: Negative for increased urination.  Genitourinary:  Negative for difficulty urinating.  Musculoskeletal:  Positive for joint pain, joint pain, joint swelling, myalgias, morning stiffness, muscle tenderness and myalgias.  Skin:  Negative for color change, rash and redness.  Allergic/Immunologic: Negative for susceptible to infections.  Neurological:  Positive for numbness, headaches, parasthesias and weakness. Negative for dizziness and memory loss.  Hematological:  Negative for bruising/bleeding tendency.  Psychiatric/Behavioral:  Negative for confusion.    PMFS History:  Patient Active Problem List   Diagnosis Date Noted   Leg swelling 09/19/2020   Thrombocytopenia ([place]) 07/12/2020   Hypokalemia 07/12/2020   Neutropenia ([place]) 07/12/2020   Routine general medical examination at a health care facility 03/15/2020   Immune thrombocytopenia ([place]) 03/26/2019   AVN of femur ([place]) 06/23/2018   Greater trochanteric bursitis of left hip 04/08/2018   Left knee pain 04/06/2018   Numbness in feet 09/14/2017   Elevated LFTs 02/24/2017   Pre-diabetes 02/19/2017   Fatigue 02/19/2017   OA (osteoarthritis) of hip 10/29/2016   Numbness and  tingling in left arm 05/05/2016   Cough 12/25/2015   Wheezing 12/25/2015   Abdominal pain 12/25/2015   Back pain 10/01/2015   Insomnia 03/01/2015   Family hx of colon cancer 11/12/2014   Essential hypertension, benign 05/15/2013   Other and unspecified hyperlipidemia 05/15/2013   Vertebral artery dissection ([place]) 05/14/2013   Hx of completed stroke 05/14/2013    Chronic anemia    Primary osteoarthritis of right knee 12/19/2007   Primary hyperparathyroidism ([place]) 11/24/2007   Osteopenia 11/24/2007   Lupus (systemic lupus erythematosus) ([place]) 01/05/2002    Past Medical History:  Diagnosis Date   Arthritis    Blind right eye    sees colors   Chronic anemia    Detached retina, right    corrected good for about 6 months sees peripheral colors only   History of kidney stones    Hypertension    Hyperthyroidism    Lupus ([place])    Pneumonia    Retinal detachment    Spasmodic dysphonia    Stroke ([place]) 2015   no permanant defecits had weakness in left arm in hospital Had PT also   Vertigo    Hx of preceding stroke    Family History  Problem Relation Age of Onset   Cancer Mother        liver   Diabetes Mother    Arthritis Mother    Cancer Father        prostate,METS, stomach    Ovarian cancer Sister    Throat cancer Brother    Past Surgical History:  Procedure Laterality Date   ABDOMINAL HYSTERECTOMY     BREAST LUMPECTOMY     left breast benign   KNEE SURGERY Left    THYROID SURGERY     TOTAL HIP ARTHROPLASTY Left 06/27/2018   Procedure: TOTAL HIP ARTHROPLASTY ANTERIOR APPROACH;  Surgeon: Frederik Pear, MD;  Location: [facility];  Service: Orthopedics;  Laterality: Left;   Social History   Social History Narrative   Not on file   Immunization History  Administered Date(s) Administered   Fluad Quad(high Dose 65+) 10/21/2018, 09/17/2020   Hep A / Hep B 03/08/2017   Influenza Whole 12/19/2007   Influenza,inj,Quad PF,6+ Mos 11/12/2014, 10/01/2015, 10/28/2016, 02/09/2018   Influenza-Unspecified 11/15/2013   PFIZER(Purple Top)SARS-COV-2 Vaccination 06/06/2019, 07/06/2019   PPD Test 09/12/2013, 01/09/2015, 03/18/2016, 03/23/2016, 08/10/2017   Pneumococcal Conjugate-13 10/21/2018   Pneumococcal Polysaccharide-23 03/14/2020   Tdap 12/05/2014   Zoster Recombinat (Shingrix) 06/24/2020   Zoster, Live 01/02/2015     Objective: Vital  Signs: BP (!) 143/89 (BP Location: Left Arm, Patient Position: Sitting, Cuff Size: Normal)    Pulse 85    Ht 5\' 3"  (1.6 m)    Wt 187 lb 9.6 oz (85.1 kg)    BMI 33.23 kg/m    Physical Exam Vitals and nursing note reviewed.  Constitutional:      Appearance: She is well-developed.  HENT:     Head: Normocephalic and atraumatic.  Eyes:     Conjunctiva/sclera: Conjunctivae normal.  Cardiovascular:     Rate and Rhythm: Normal rate and regular rhythm.     Heart sounds: Normal heart sounds.  Pulmonary:     Effort: Pulmonary effort is normal.     Breath sounds: Normal breath sounds.  Abdominal:     General: Bowel sounds are normal.     Palpations: Abdomen is soft.  Musculoskeletal:     Cervical back: Normal range of motion.  Lymphadenopathy:     Cervical: No  cervical adenopathy.  Skin:    General: Skin is warm and dry.     Capillary Refill: Capillary refill takes less than 2 seconds.  Neurological:     Mental Status: She is alert and oriented to person, place, and time.  Psychiatric:        Behavior: Behavior normal.     Musculoskeletal Exam: C-spine has good range of motion with no discomfort.  Painful range of motion above shoulder joints.  Elbow joints, wrist joints, MCPs, PIPs, DIPs have good range of motion with no synovitis.  Complete fist formation bilaterally.  She has slightly limited range of motion of both hip joints with discomfort bilaterally.  Painful range of motion of both knee joints.  No warmth or effusion was noted.  She has tenderness ovation over both ankle joints.  CDAI Exam: CDAI Score: -- Patient Global: --; Provider Global: -- Swollen: --; Tender: -- Joint Exam 03/03/2021   No joint exam has been documented for this visit   There is currently no information documented on the homunculus. Go to the Rheumatology activity and complete the homunculus joint exam.  Investigation: No additional findings.  Imaging: No results found.  Recent Labs: Lab Results   Component Value Date   WBC 2.4 (L) 02/12/2021   HGB 12.4 02/12/2021   PLT 191 02/12/2021   NA 141 02/12/2021   K 3.7 02/12/2021   CL 107 02/12/2021   CO2 28 02/12/2021   GLUCOSE 97 02/12/2021   BUN 19 02/12/2021   CREATININE 0.84 02/12/2021   BILITOT 0.7 02/12/2021   ALKPHOS 91 02/12/2021   AST 31 02/12/2021   ALT 23 02/12/2021   PROT 7.6 02/12/2021   ALBUMIN 4.3 02/12/2021   CALCIUM 9.2 02/12/2021   GFRAA 85 07/11/2020   QFTBGOLDPLUS NEGATIVE 01/31/2020    Speciality Comments: PLQ-blisters all over the body  Procedures:  No procedures performed Allergies: Plaquenil [hydroxychloroquine sulfate], Azithromycin, Lactose intolerance (gi), and Penicillin g   Assessment / Plan:     Visit Diagnoses: SLE (systemic lupus erythematosus related syndrome) ([place]) - Dxd in [place]. History of rash, hives, fatigue, nasal ulcers, sicca symptoms, hair loss, Raynauds, arthralgias. +ANA,+ Ro , elevated sed rate, ITP: Patient presents today to discuss treatment options for management of systemic lupus.  She has been experiencing increased arthralgias, joint stiffness, and fatigue over the past several weeks.  Her pain has been severe and has been interfering with her activities of daily living.  She has also had more frequent symptoms of Raynaud's as well as eye dryness.  She discontinued Imuran after being hospitalized for relapsing ITP in July 2022.  She received 4 doses of Rituxan prescribed by Dr. Irene Limbo for management of ITP.  Her most recent office visit with Dr. Irene Limbo was on 02/12/2021 at which time there was no evidence of relapse of ITP. Dr. Arlean Hopping current recommendation is to initiate low-dose CellCept 500 mg 1 tablet daily with close lab monitoring.  Indications, contraindications, potential side effects of CellCept were discussed today in detail.  All questions were addressed and consent was obtained today.  A prescription for low-dose CellCept was sent to the pharmacy along with a  prednisone taper starting at 20 mg tapering by 5 mg every 4 days.  She will require close lab monitoring as well as close follow-up while on the current treatment regimen.  She will follow-up in the office in about 6 weeks or sooner if needed.  Medication counseling:  TB Gold: 01/31/20 HIV: Negative on  01/31/20  Chest-xray:   No active cardiopulmonary disease on 06/23/18  Contraception: Postmenopausal   Patient was counseled on the purpose, proper use, and adverse effects of mycophenolate including risk of infection, new or reactivation of viral infections, nausea, and headaches.  Discussed warning of increased risk of development of lymphoma and other malignancies, particularly of the skin.  Discussed risk of neutropenia and discussed importance of regular labs to monitor blood counts, liver function, and kidney function.  Reviewed risk of congenital malformations, and the importance of contraception while on this medication.  Counseled patient on importance of taking PCP prophylaxis while on mycophenolate.  Due to patients sulfa allergy, will initiate patient on dapsone 100 mg daily.  Counseled patient on purpose, proper use, and adverse effects of Dapsone.  G6PD ordered today.  Provided patient with educational materials and answered all questions.  Patient consented to mycophenolate.    High risk medication use -She will be initiating Cellcept.  She will remain on low-dose CellCept 500 mg 1 tablet daily.  She will return for lab work in 2 weeks.  She will require close lab monitoring due to history of leukopenia/neutropenia.  CBC and CMP were updated on 02/12/2021 and results were reviewed today in the office and with Dr. Corliss Skains.  WBC count was 2.4 and absolute neutrophil count was 1.0 on 02/12/21.  Previously discontinued imuran due to ITP.  Rixutan used previously to manage ITP-prescribed by Dr. Candise Che.  Discussed the importance of holding CellCept if she develops signs or symptoms of an infection and  to resume once the infection has completely cleared.  She voiced understanding.  Immune thrombocytopenia ([facility]) - History of ITP secondary to SLE.  Managed by Dr. Candise Che. Previously treated with rituxan, steriods, and IVIG starting in April 2021 and achieved remission. Hospitalized for relapse in July 2022.  She has been off of Imuran since the hospitalization. She was treated with 4 doses of Rituxan.  Her last dose of Rituxan was administered in November 2022 per patient: 11/11/2020.  She was recently evaluated by Dr. Candise Che on 02/12/2021.  Platelet count was 191k on 02/12/21.   I reviewed the office visit note from 02/12/2021 which indicates that there is no need for additional treatment of the patient's ITP at this time.  Lupus anticoagulant positive - Hx of old stroke related to vertebral artery dissection.  TPMT intermediate metabolizer (HCC) - Advised to discontinue Imuran.    Elevated LFTs: LFTs were within normal limits on 02/12/2021.  Primary osteoarthritis of both hands - Osteoarthritic changes noted on exam and on x-rays.  She has PIP and DIP thickening consistent with osteoarthritis of both hands.  No tenderness or inflammation was noted on examination.  She has been experiencing increased pain and stiffness in both hands over the past several weeks.  Status post total hip replacement, left - History of left AVN s/p THA June 2020-SE of Steroid use.  Limited range of motion of the left hip joint noted.  She presents today with increased discomfort in both legs.  Primary osteoarthritis of right knee: She has been experiencing increased pain in both knee joints especially the right knee.  On examination today there is no warmth or effusion.  She has been having difficulty performing her ADLs due to the discomfort.    Primary osteoarthritis of both feet: She is having increased discomfort in both feet.  She has tenderness to palpation over both ankle joints.  DDD (degenerative disc disease), lumbar -  X-rays ordered by PCP  revealed degenerative changes.  She has occasional discomfort in her lower back.  No symptoms of radiculopathy at this time.  Other medical conditions are listed as follows:  Vertebral artery dissection ([place])  Essential hypertension, benign  Primary hyperparathyroidism ([place])  Vitamin D deficiency  Hx of completed stroke  Spasmodic dysphonia  Pre-diabetes  Orders: No orders of the defined types were placed in this encounter.  Meds ordered this encounter  Medications   predniSONE (DELTASONE) 5 MG tablet    Sig: Take 4 tablets by mouth daily x4 days, 3 tablets daily x4 days, 2 tablets daily x4 days, 1 tablet daily x4 days.    Dispense:  40 tablet    Refill:  0   mycophenolate (CELLCEPT) 500 MG tablet    Sig: Take 1 tablet (500 mg total) by mouth daily.    Dispense:  30 tablet    Refill:  1     Follow-Up Instructions: Return in about 6 weeks (around 04/14/2021) for Systemic lupus erythematosus, Osteoarthritis.   Ofilia Neas, PA-C  Note - This record has been created using Dragon software.  Chart creation errors have been sought, but may not always  have been located. Such creation errors do not reflect on  the standard of medical care.

## 2021-03-03 ENCOUNTER — Telehealth: Payer: Self-pay | Admitting: *Deleted

## 2021-03-03 ENCOUNTER — Other Ambulatory Visit: Payer: Self-pay

## 2021-03-03 ENCOUNTER — Encounter: Payer: Self-pay | Admitting: Physician Assistant

## 2021-03-03 ENCOUNTER — Ambulatory Visit (INDEPENDENT_AMBULATORY_CARE_PROVIDER_SITE_OTHER): Payer: Medicare Other | Admitting: Physician Assistant

## 2021-03-03 ENCOUNTER — Encounter: Payer: Self-pay | Admitting: *Deleted

## 2021-03-03 ENCOUNTER — Telehealth: Payer: Self-pay

## 2021-03-03 VITALS — BP 143/89 | HR 85 | Ht 63.0 in | Wt 187.6 lb

## 2021-03-03 DIAGNOSIS — Z8673 Personal history of transient ischemic attack (TIA), and cerebral infarction without residual deficits: Secondary | ICD-10-CM

## 2021-03-03 DIAGNOSIS — D693 Immune thrombocytopenic purpura: Secondary | ICD-10-CM

## 2021-03-03 DIAGNOSIS — Z96642 Presence of left artificial hip joint: Secondary | ICD-10-CM

## 2021-03-03 DIAGNOSIS — R760 Raised antibody titer: Secondary | ICD-10-CM

## 2021-03-03 DIAGNOSIS — I10 Essential (primary) hypertension: Secondary | ICD-10-CM

## 2021-03-03 DIAGNOSIS — M19041 Primary osteoarthritis, right hand: Secondary | ICD-10-CM

## 2021-03-03 DIAGNOSIS — M19042 Primary osteoarthritis, left hand: Secondary | ICD-10-CM

## 2021-03-03 DIAGNOSIS — I7774 Dissection of vertebral artery: Secondary | ICD-10-CM

## 2021-03-03 DIAGNOSIS — M329 Systemic lupus erythematosus, unspecified: Secondary | ICD-10-CM | POA: Diagnosis not present

## 2021-03-03 DIAGNOSIS — E8889 Other specified metabolic disorders: Secondary | ICD-10-CM | POA: Diagnosis not present

## 2021-03-03 DIAGNOSIS — Z79899 Other long term (current) drug therapy: Secondary | ICD-10-CM

## 2021-03-03 DIAGNOSIS — E559 Vitamin D deficiency, unspecified: Secondary | ICD-10-CM

## 2021-03-03 DIAGNOSIS — M19071 Primary osteoarthritis, right ankle and foot: Secondary | ICD-10-CM | POA: Diagnosis not present

## 2021-03-03 DIAGNOSIS — E210 Primary hyperparathyroidism: Secondary | ICD-10-CM

## 2021-03-03 DIAGNOSIS — M5136 Other intervertebral disc degeneration, lumbar region: Secondary | ICD-10-CM | POA: Diagnosis not present

## 2021-03-03 DIAGNOSIS — M1711 Unilateral primary osteoarthritis, right knee: Secondary | ICD-10-CM | POA: Diagnosis not present

## 2021-03-03 DIAGNOSIS — J383 Other diseases of vocal cords: Secondary | ICD-10-CM

## 2021-03-03 DIAGNOSIS — M19072 Primary osteoarthritis, left ankle and foot: Secondary | ICD-10-CM

## 2021-03-03 DIAGNOSIS — R7989 Other specified abnormal findings of blood chemistry: Secondary | ICD-10-CM

## 2021-03-03 DIAGNOSIS — R7303 Prediabetes: Secondary | ICD-10-CM

## 2021-03-03 MED ORDER — MYCOPHENOLATE MOFETIL 500 MG PO TABS: 500.0000 mg | ORAL_TABLET | Freq: Every day | ORAL | 1 refills | Status: DC

## 2021-03-03 MED ORDER — PREDNISONE 5 MG PO TABS: ORAL_TABLET | ORAL | 0 refills | Status: DC

## 2021-03-03 NOTE — Telephone Encounter (Signed)
°  Chronic Care Management   Follow Up Note   03/03/2021 Name: Elaine Allen MRN: [id number] DOB: 03/08/1955  Referred by: Myrlene Broker, MD Reason for referral : Chronic Care Management (CCM RN CM Initial Outreach; Unsuccessful attempt)  A second unsuccessful telephone outreach was attempted today. The patient was referred to the case management team for assistance with care management and care coordination.   Follow Up Plan:  A HIPPA compliant phone message was left for the patient providing contact information and requesting a return call Will place request with scheduling care guide to contact patient to re-schedule today's missed CCM RN initial telephone appointment if I do not hear back from patient by end of day  Caryl Pina, RN, BSN, CCRN Alumnus CCM Clinic RN Care Coordination- [facility] [place] [phone]: direct office

## 2021-03-03 NOTE — Progress Notes (Signed)
Pharmacy Note  Subjective: Patient presents today to the [facility] for follow up office visit.  Patient seen by the pharmacist for counseling on mycophenolate (CellCept) for systemic lupus erythematosus.  Objective: CBC    Component Value Date/Time   WBC 2.4 (L) 02/12/2021 1408   WBC 9.5 07/14/2020 0541   RBC 4.16 02/12/2021 1408   RBC 4.13 02/12/2021 1408   HGB 12.4 02/12/2021 1408   HGB 13.4 02/05/2012 1205   HCT 36.4 02/12/2021 1408   HCT 39.9 02/05/2012 1205   PLT 191 02/12/2021 1408   PLT 172 02/05/2012 1205   MCV 88.1 02/12/2021 1408   MCV 88 02/05/2012 1205   MCH 30.0 02/12/2021 1408   MCHC 34.1 02/12/2021 1408   RDW 13.5 02/12/2021 1408   RDW 13.8 02/05/2012 1205   LYMPHSABS 0.9 02/12/2021 1408   LYMPHSABS 1.3 02/05/2012 1205   MONOABS 0.4 02/12/2021 1408   MONOABS 0.3 02/05/2012 1205   EOSABS 0.1 02/12/2021 1408   EOSABS 0.1 02/05/2012 1205   BASOSABS 0.0 02/12/2021 1408   BASOSABS 0.0 02/05/2012 1205    CMP     Component Value Date/Time   NA 141 02/12/2021 1408   NA 147 09/06/2018 0000   K 3.7 02/12/2021 1408   CL 107 02/12/2021 1408   CO2 28 02/12/2021 1408   GLUCOSE 97 02/12/2021 1408   BUN 19 02/12/2021 1408   BUN 18 09/06/2018 0000   CREATININE 0.84 02/12/2021 1408   CREATININE 0.83 07/11/2020 1428   CALCIUM 9.2 02/12/2021 1408   CALCIUM 11.7 (H) 10/15/2008 2333   PROT 7.6 02/12/2021 1408   ALBUMIN 4.3 02/12/2021 1408   AST 31 02/12/2021 1408   ALT 23 02/12/2021 1408   ALKPHOS 91 02/12/2021 1408   BILITOT 0.7 02/12/2021 1408   GFRNONAA >60 02/12/2021 1408   GFRNONAA 73 07/11/2020 1428   GFRAA 85 07/11/2020 1428    Baseline Immunosuppressant Therapy Labs TB GOLD Quantiferon TB Gold Latest Ref Rng & Units 01/31/2020  Quantiferon TB Gold Plus NEGATIVE NEGATIVE   Hepatitis Panel Hepatitis Latest Ref Rng & Units 03/09/2019  Hep B Surface Ag NON REACTIVE NON REACTIVE  Hep B IgM NON-REACTI -  Hep C Ab NON REACTIVE NON REACTIVE   Hep C Ab NON-REACTI -  Hep C Ab NON-REACTI -  Hep A IgM NON-REACTI -   HIV Lab Results  Component Value Date   HIV NON-REACTIVE 01/31/2020   Immunoglobulins Immunoglobulin Electrophoresis Latest Ref Rng & Units 01/31/2020  IgA  70 - 320 mg/dL 655(V)  IgG 748 - 2,707 mg/dL 8,675(Q)  IgM 50 - 492 mg/dL 96   SPEP Serum Protein Electrophoresis Latest Ref Rng & Units 02/12/2021  Total Protein 6.5 - 8.1 g/dL 7.6  Albumin 3.8 - 4.8 g/dL -  Alpha-1 0.2 - 0.3 g/dL -  Alpha-2 0.5 - 0.9 g/dL -  Beta Globulin 0.4 - 0.6 g/dL -  Beta 2 0.2 - 0.5 g/dL -  Gamma Globulin 0.8 - 1.7 g/dL -   E1EO No results found for: G6PDH TPMT Lab Results  Component Value Date   TPMT 12 (L) 01/31/2020    Chest-xray:  06/24/18 - No active cardiopulmonary disease.  Contraception: post-menopausal  Assessment/Plan:  Patient was counseled on the purpose, proper use, and adverse effects of mycophenolate including risk of infection, new or reactivation of viral infections, nausea, and headaches.  Discussed warning of increased risk of development of lymphoma and other malignancies, particularly of the skin.  Discussed risk of neutropenia  and discussed importance of regular labs to monitor blood counts, liver function, and kidney function.  Reviewed risk of congenital malformations, and the importance of contraception while on this medication (patient is post-menopausal).  Counseled patient on importance of taking PCP prophylaxis while on mycophenolate.  Counseled patient on purpose, proper use, and adverse effects of sulfamethoxazole/trimethoprim three times a week. Provided patient with educational materials and answered all questions.  Patient consented to mycophenolate.    Dose is pending discussion with Dr. Corliss Skains, per Sherron Ales, PA-C.   Chesley Mires, PharmD, MPH, BCPS Clinical Pharmacist (Rheumatology and Pulmonology)

## 2021-03-10 ENCOUNTER — Telehealth: Payer: Self-pay | Admitting: *Deleted

## 2021-03-10 NOTE — Chronic Care Management (AMB) (Signed)
?  Care Management  ? ?Note ? ?03/10/2021 ?Name: Elaine Allen MRN: [id number] DOB: Dec 19, 1955 ? ?Elaine Allen is a 67 y.o. year old female who is a primary care patient of Myrlene Broker, MD and is actively engaged with the care management team. I reached out to Ranae Plumber Keithly by phone today to assist with re-scheduling an initial visit with the RN Case Manager ? ?Follow up plan: ?Unsuccessful telephone outreach attempt made. A HIPAA compliant phone message was left for the patient providing contact information and requesting a return call.  ?The care management team will reach out to the patient again over the next 7 days.  ?If patient returns call to provider office, please advise to call Embedded Care Management Care Guide [name] at [phone]. ? ?[name]  ?Care Guide, Embedded Care Coordination ?[place] [place]  Care Management  ?Direct Dial: [phone] ? ?

## 2021-03-17 NOTE — Chronic Care Management (AMB) (Signed)
?  Care Management  ? ?Note ? ?03/17/2021 ?Name: Elaine Allen MRN: [id number] DOB: 06/14/1955 ? ?Elaine Allen is a 66 y.o. year old female who is a primary care patient of Hoyt Koch, MD and is actively engaged with the care management team. I reached out to [name] by phone today to assist with re-scheduling an initial visit with the RN Case Manager ? ?Follow up plan: ?Unsuccessful telephone outreach attempt made. A HIPAA compliant phone message was left for the patient providing contact information and requesting a return call.  ?The care management team will reach out to the patient again over the next 7 days.  ?If patient returns call to provider office, please advise to call [name]  at [phone]. ? ?Laverda Sorenson  ?Care Guide, Embedded Care Coordination ?[place]  Care Management  ?Direct Dial: [phone] ? ?

## 2021-03-24 NOTE — Chronic Care Management (AMB) (Signed)
?  Care Management  ? ?Note ? ?03/24/2021 ?Name: Elaine Allen MRN: [id number] DOB: Mar 01, 1955 ? ?Elaine Allen is a 66 y.o. year old female who is a primary care patient of Myrlene Broker, MD and is actively engaged with the care management team. I reached out to Ranae Plumber Farino by phone today to assist with re-scheduling an initial visit with the RN Case Manager ? ?Follow up plan: ?A third unsuccessful telephone outreach attempt made. A HIPAA compliant phone message was left for the patient providing contact information and requesting a return call. Unable to make contact on outreach attempts x 3. PCP Dr Okey Dupre notified via routed documentation in medical record. We have been unable to make contact with the patient for follow up. The care management team is available to follow up with the patient after provider conversation with the patient regarding recommendation for care management engagement and subsequent re-referral to the care management team.  ? ?[name]  ?Care Guide, Embedded Care Coordination ?[place]  Care Management  ?Direct Dial: [phone] ? ?

## 2021-04-18 ENCOUNTER — Encounter: Payer: Self-pay | Admitting: Internal Medicine

## 2021-04-18 ENCOUNTER — Other Ambulatory Visit: Payer: Self-pay

## 2021-04-18 MED ORDER — LOSARTAN POTASSIUM-HCTZ 100-25 MG PO TABS: 1.0000 | ORAL_TABLET | Freq: Every day | ORAL | 0 refills | Status: DC

## 2021-04-21 ENCOUNTER — Inpatient Hospital Stay: Payer: Medicare Other

## 2021-04-21 ENCOUNTER — Other Ambulatory Visit: Payer: Self-pay

## 2021-04-21 DIAGNOSIS — D693 Immune thrombocytopenic purpura: Secondary | ICD-10-CM

## 2021-04-23 ENCOUNTER — Inpatient Hospital Stay: Payer: Medicare Other | Attending: Hematology

## 2021-04-23 ENCOUNTER — Other Ambulatory Visit: Payer: Self-pay

## 2021-04-23 ENCOUNTER — Inpatient Hospital Stay: Payer: Medicare Other | Admitting: Hematology

## 2021-04-23 DIAGNOSIS — D693 Immune thrombocytopenic purpura: Secondary | ICD-10-CM | POA: Diagnosis not present

## 2021-04-23 LAB — CBC WITH DIFFERENTIAL (CANCER CENTER ONLY)
Abs Immature Granulocytes: 0 10*3/uL (ref 0.00–0.07)
Basophils Absolute: 0 10*3/uL (ref 0.0–0.1)
Basophils Relative: 0 %
Eosinophils Absolute: 0.1 10*3/uL (ref 0.0–0.5)
Eosinophils Relative: 4 %
HCT: 41.9 % (ref 36.0–46.0)
Hemoglobin: 14 g/dL (ref 12.0–15.0)
Immature Granulocytes: 0 %
Lymphocytes Relative: 35 %
Lymphs Abs: 1 10*3/uL (ref 0.7–4.0)
MCH: 29.5 pg (ref 26.0–34.0)
MCHC: 33.4 g/dL (ref 30.0–36.0)
MCV: 88.4 fL (ref 80.0–100.0)
Monocytes Absolute: 0.5 10*3/uL (ref 0.1–1.0)
Monocytes Relative: 16 %
Neutro Abs: 1.3 10*3/uL — ABNORMAL LOW (ref 1.7–7.7)
Neutrophils Relative %: 45 %
Platelet Count: 178 10*3/uL (ref 150–400)
RBC: 4.74 MIL/uL (ref 3.87–5.11)
RDW: 13.7 % (ref 11.5–15.5)
WBC Count: 2.9 10*3/uL — ABNORMAL LOW (ref 4.0–10.5)
nRBC: 0 % (ref 0.0–0.2)

## 2021-04-23 LAB — CMP (CANCER CENTER ONLY)
ALT: 23 U/L (ref 0–44)
AST: 32 U/L (ref 15–41)
Albumin: 4.4 g/dL (ref 3.5–5.0)
Alkaline Phosphatase: 127 U/L — ABNORMAL HIGH (ref 38–126)
Anion gap: 7 (ref 5–15)
BUN: 17 mg/dL (ref 8–23)
CO2: 30 mmol/L (ref 22–32)
Calcium: 9.8 mg/dL (ref 8.9–10.3)
Chloride: 104 mmol/L (ref 98–111)
Creatinine: 0.94 mg/dL (ref 0.44–1.00)
GFR, Estimated: >60 mL/min (ref >60)
Glucose, Bld: 120 mg/dL — ABNORMAL HIGH (ref 70–99)
Potassium: 3.9 mmol/L (ref 3.5–5.1)
Sodium: 141 mmol/L (ref 135–145)
Total Bilirubin: 0.9 mg/dL (ref 0.3–1.2)
Total Protein: 8 g/dL (ref 6.5–8.1)

## 2021-04-23 LAB — RETIC PANEL
Immature Retic Fract: 16.7 % — ABNORMAL HIGH (ref 2.3–15.9)
RBC.: 4.74 MIL/uL (ref 3.87–5.11)
Retic Count, Absolute: 69.7 10*3/uL (ref 19.0–186.0)
Retic Ct Pct: 1.5 % (ref 0.4–3.1)
Reticulocyte Hemoglobin: 32.6 pg (ref >27.9)

## 2021-05-05 ENCOUNTER — Inpatient Hospital Stay: Payer: Medicare Other | Attending: Hematology | Admitting: Hematology

## 2021-05-05 ENCOUNTER — Other Ambulatory Visit: Payer: Self-pay

## 2021-05-05 DIAGNOSIS — Z8673 Personal history of transient ischemic attack (TIA), and cerebral infarction without residual deficits: Secondary | ICD-10-CM | POA: Insufficient documentation

## 2021-05-05 DIAGNOSIS — M329 Systemic lupus erythematosus, unspecified: Secondary | ICD-10-CM | POA: Insufficient documentation

## 2021-05-05 DIAGNOSIS — Z79899 Other long term (current) drug therapy: Secondary | ICD-10-CM | POA: Diagnosis not present

## 2021-05-05 DIAGNOSIS — D6862 Lupus anticoagulant syndrome: Secondary | ICD-10-CM | POA: Insufficient documentation

## 2021-05-05 DIAGNOSIS — Z7952 Long term (current) use of systemic steroids: Secondary | ICD-10-CM | POA: Diagnosis not present

## 2021-05-05 DIAGNOSIS — D693 Immune thrombocytopenic purpura: Secondary | ICD-10-CM | POA: Insufficient documentation

## 2021-05-05 DIAGNOSIS — Z7969 Long term (current) use of other immunomodulators and immunosuppressants: Secondary | ICD-10-CM | POA: Diagnosis not present

## 2021-05-05 MED ORDER — MYCOPHENOLATE MOFETIL 500 MG PO TABS: 500.0000 mg | ORAL_TABLET | Freq: Every day | ORAL | 2 refills | Status: DC

## 2021-05-05 NOTE — Telephone Encounter (Signed)
LMOM for patient to call and schedule follow-up appointment.   °

## 2021-05-05 NOTE — Telephone Encounter (Signed)
Patient left a voicemail stating she has not received her Cellcept medication and requested a return call.   ?

## 2021-05-05 NOTE — Telephone Encounter (Signed)
Please schedule patient for a follow up visit. Patient was due April 2023. Thanks!  ?

## 2021-05-05 NOTE — Progress Notes (Signed)
? ? ?HEMATOLOGY/ONCOLOGY PHONE VISIT NOTE ? ?Date of Service: .05/05/2021 ? ? ?Patient Care Team: ?Myrlene Broker, MD as PCP - General (Internal Medicine) ? ?REFERRING PHYSICIAN: Hillard Danker A, * ? ?CHIEF COMPLAINTS/PURPOSE OF CONSULTATION:  ?Follow-up for continued evaluation and management of ITP related to SLE ? ? ?INTERVAL HISTORY ?.I connected with Elaine Allen on 05/05/21 at  1:00 PM EDT by telephone visit and verified that I am speaking with the correct person using two identifiers.  ? ?I discussed the limitations, risks, security and privacy concerns of performing an evaluation and management service by telemedicine and the availability of in-person appointments. I also discussed with the patient that there may be a patient responsible charge related to this service. The patient expressed understanding and agreed to proceed.  ? ?Other persons participating in the visit and their role in the encounter: none  ? ?Patient?s location: Home ?Provider?s location: [facility] ? ?Chief Complaint: Follow-up for ITP and lab review ? ?Patient notes no acute new symptoms since her last clinic visit.  She notes some mild intermittent flares from her SLE.  Was last seen by rheumatology in February 2023.  She was prescribed CellCept continues to be on prednisone.  Patient notes that she has not started the CellCept yet due to insurance issues and trying to get the medication approved. ?Has been off the Imuran. ?No issues with infections. ?No issues with abnormal bleeding or bruising. ?Labs from 04/23/2021 reviewed and showed normal platelet counts improved hemoglobin with no anemia and mild leukopenia which was also improving off Imuran. ? ?MEDICAL HISTORY:  ?Past Medical History:  ?Diagnosis Date  ? Arthritis   ? Blind right eye   ? sees colors  ? Chronic anemia   ? Detached retina, right   ? corrected good for about 6 months sees peripheral colors only  ? History of kidney stones   ?  Hypertension   ? Hyperthyroidism   ? Lupus (HCC)   ? Pneumonia   ? Retinal detachment   ? Spasmodic dysphonia   ? Stroke [facility]) 2015  ? no permanant defecits had weakness in left arm in hospital Had PT also  ? Vertigo   ? Hx of preceding stroke  ? ? ? ?SURGICAL HISTORY: ?Past Surgical History:  ?Procedure Laterality Date  ? ABDOMINAL HYSTERECTOMY    ? BREAST LUMPECTOMY    ? left breast benign  ? KNEE SURGERY Left   ? THYROID SURGERY    ? TOTAL HIP ARTHROPLASTY Left 06/27/2018  ? Procedure: TOTAL HIP ARTHROPLASTY ANTERIOR APPROACH;  Surgeon: Gean Birchwood, MD;  Location: [facility];  Service: Orthopedics;  Laterality: Left;  ? ? ? ?SOCIAL HISTORY: ?Social History  ? ?Socioeconomic History  ? Marital status: Single  ?  Spouse name: Not on file  ? Number of children: 2  ? Years of education: college  ? Highest education level: Not on file  ?Occupational History  ? Occupation: options  ?Tobacco Use  ? Smoking status: Never  ?  Passive exposure: Never  ? Smokeless tobacco: Never  ?Vaping Use  ? Vaping Use: Never used  ?Substance and Sexual Activity  ? Alcohol use: No  ? Drug use: No  ? Sexual activity: Not Currently  ?Other Topics Concern  ? Not on file  ?Social History Narrative  ? Not on file  ? ?Social Determinants of Health  ? ?Financial Resource Strain: Low Risk   ? Difficulty of Paying Living Expenses: Not hard at all  ?  Food Insecurity: No Food Insecurity  ? Worried About Programme researcher, broadcasting/film/video in the Last Year: Never true  ? Ran Out of Food in the Last Year: Never true  ?Transportation Needs: No Transportation Needs  ? Lack of Transportation (Medical): No  ? Lack of Transportation (Non-Medical): No  ?Physical Activity: Sufficiently Active  ? Days of Exercise per Week: 5 days  ? Minutes of Exercise per Session: 30 min  ?Stress: No Stress Concern Present  ? Feeling of Stress : Not at all  ?Social Connections: Moderately Integrated  ? Frequency of Communication with Friends and Family: More than three times a week  ? Frequency  of Social Gatherings with Friends and Family: More than three times a week  ? Attends Religious Services: More than 4 times per year  ? Active Member of Clubs or Organizations: Yes  ? Attends Banker Meetings: More than 4 times per year  ? Marital Status: Divorced  ?Intimate Partner Violence: Not At Risk  ? Fear of Current or Ex-Partner: No  ? Emotionally Abused: No  ? Physically Abused: No  ? Sexually Abused: No  ? ? ? ?FAMILY HISTORY: ?Family History  ?Problem Relation Age of Onset  ? Cancer Mother   ?     liver  ? Diabetes Mother   ? Arthritis Mother   ? Cancer Father   ?     prostate,METS, stomach   ? Ovarian cancer Sister   ? Throat cancer Brother   ? ? ? ?ALLERGIES:   ?is allergic to plaquenil [hydroxychloroquine sulfate], azithromycin, lactose intolerance (gi), and penicillin g. ? ? ?MEDICATIONS:  ?Current Outpatient Medications  ?Medication Sig Dispense Refill  ? diclofenac Sodium (VOLTAREN) 1 % GEL Apply 2 g topically 4 (four) times daily. (Patient not taking: Reported on 03/03/2021) 100 g 3  ? losartan-hydrochlorothiazide (HYZAAR) 100-25 MG tablet Take 1 tablet by mouth daily. 7 tablet 0  ? mycophenolate (CELLCEPT) 500 MG tablet Take 1 tablet (500 mg total) by mouth daily. 30 tablet 1  ? potassium chloride SA (KLOR-CON) 20 MEQ tablet Take 1 tablet (20 mEq total) by mouth daily. (Patient not taking: Reported on 09/16/2020) 3 tablet 0  ? predniSONE (DELTASONE) 5 MG tablet Take 4 tablets by mouth daily x4 days, 3 tablets daily x4 days, 2 tablets daily x4 days, 1 tablet daily x4 days. 40 tablet 0  ? Probiotic Product (PROBIOTIC PO) Take 1 capsule by mouth daily.    ? vitamin B-12 (CYANOCOBALAMIN) 1000 MCG tablet Take 1,000 mcg by mouth daily.    ? ?No current facility-administered medications for this visit.  ? ? ?REVIEW OF SYSTEMS:   ? ?No abnormal bleeding or bruising ? ? ?PHYSICAL EXAMINATION: ?Telemedicine visit ? ?LABORATORY DATA:  ?I have reviewed the data as listed ? ? ?  Latest Ref Rng &  Units 04/23/2021  ?  9:21 AM 02/12/2021  ?  2:08 PM 11/12/2020  ?  3:37 PM  ?CBC  ?WBC 4.0 - 10.5 K/uL 2.9   2.4   2.3    ?Hemoglobin 12.0 - 15.0 g/dL 35.3   29.9   24.2    ?Hematocrit 36.0 - 46.0 % 41.9   36.4   38.2    ?Platelets 150 - 400 K/uL 178   191   185    ?ANC 1300 ? ? ?  Latest Ref Rng & Units 04/23/2021  ?  9:21 AM 02/12/2021  ?  2:08 PM 11/12/2020  ?  3:37 PM  ?CMP  ?  Glucose 70 - 99 mg/dL 161120   97   096104    ?BUN 8 - 23 mg/dL 17   19   12     ?Creatinine 0.44 - 1.00 mg/dL 0.450.94   4.090.84   8.110.88    ?Sodium 135 - 145 mmol/L 141   141   142    ?Potassium 3.5 - 5.1 mmol/L 3.9   3.7   3.8    ?Chloride 98 - 111 mmol/L 104   107   105    ?CO2 22 - 32 mmol/L 30   28   26     ?Calcium 8.9 - 10.3 mg/dL 9.8   9.2   9.8    ?Total Protein 6.5 - 8.1 g/dL 8.0   7.6   7.8    ?Total Bilirubin 0.3 - 1.2 mg/dL 0.9   0.7   0.7    ?Alkaline Phos 38 - 126 U/L 127   91   109    ?AST 15 - 41 U/L 32   31   24    ?ALT 0 - 44 U/L 23   23   17     ? ?RADIOGRAPHIC STUDIES: ?I have personally reviewed the radiological images as listed and agreed with the findings in the report. ?No results found. ? ?ASSESSMENT & PLAN:  ? ?Elaine Allen is a 66 y.o. female with: ? ?1.  Relapsing ITP due to lupus ?-Likely related to Immune thrombocytopenia in the setting of lupus. ?Patient was treated for her relapsed ITP with 4 doses of Rituxan again. ?Plan ?-Labs done on 04/23/2021 showed no evidence of relapse of the patient's ITP at this time she had normal platelet counts normal hemoglobin and mild leukopenia/neutropenia likely related to her lupus. ?-No indication for additional treatment for the patient's ITP at this time. ?-She should continue to follow-up with rheumatology to optimize treatment for her SLE since this will have a bearing on her ITP relapse risk. ? ?2. Lupus - ?Was previously on Imuran.  Currently not on any medications ?Currently on prednisone.  Was prescribed CellCept in February 2023 but has not received the medication or started the  medication yet. ?PLan ?-Continue follow-up with Dr. Corliss Skainseveshwar for continued rheumatologic cares. ? ?3. H/o AVN -  ?related to steroids s/p left THA. ? ?4. Lupus anticoagulant positive-history of old stroke

## 2021-05-05 NOTE — Telephone Encounter (Signed)
Next Visit: Due April 2023 ? ?Last Visit: 03/03/2021 ? ?Last Fill: 03/03/2021 ? ?DX: SLE (systemic lupus erythematosus related syndrome) ? ?Current Dose per office note 03/03/2021: CellCept 500 mg 1 tablet daily ? ?Labs: 04/23/2021 Glucose 120, Alk. Phos 127, WBC 2.9, Neutro Abs. 1.3 ? ?Okay to refill Cellcept?  ?

## 2021-05-06 ENCOUNTER — Telehealth: Payer: Self-pay | Admitting: Hematology

## 2021-05-06 NOTE — Telephone Encounter (Signed)
Scheduled follow-up appointments per 5/1 los. Patient is aware. ?

## 2021-05-07 ENCOUNTER — Telehealth: Payer: Self-pay | Admitting: *Deleted

## 2021-05-07 NOTE — Telephone Encounter (Signed)
Spoke with patient and she states she was just able to pick up the prescription from the pharmacy today. Patient states she started the Cellcept this morning.  ?

## 2021-05-07 NOTE — Telephone Encounter (Signed)
-----   Message from Pollyann Savoy, MD sent at 05/07/2021  1:10 PM EDT ----- ?Please call the patient to check if she is taking CellCept.  If not then why?  According to Dr. Clyda Greener note patient is not taking CellCept. ? ?Pollyann Savoy, MD  ? ?----- Message ----- ?From: Johney Maine, MD ?Sent: 05/05/2021   2:25 PM EDT ?To: Pollyann Savoy, MD, Myrlene Broker, MD ? ? ?

## 2021-05-21 ENCOUNTER — Other Ambulatory Visit: Payer: Self-pay | Admitting: Internal Medicine

## 2021-05-23 DIAGNOSIS — Z79899 Other long term (current) drug therapy: Secondary | ICD-10-CM | POA: Diagnosis not present

## 2021-05-23 DIAGNOSIS — Z8673 Personal history of transient ischemic attack (TIA), and cerebral infarction without residual deficits: Secondary | ICD-10-CM | POA: Diagnosis not present

## 2021-05-23 DIAGNOSIS — Z1159 Encounter for screening for other viral diseases: Secondary | ICD-10-CM | POA: Diagnosis not present

## 2021-05-23 DIAGNOSIS — E213 Hyperparathyroidism, unspecified: Secondary | ICD-10-CM | POA: Diagnosis not present

## 2021-05-23 DIAGNOSIS — D709 Neutropenia, unspecified: Secondary | ICD-10-CM | POA: Diagnosis not present

## 2021-05-23 DIAGNOSIS — M8580 Other specified disorders of bone density and structure, unspecified site: Secondary | ICD-10-CM | POA: Diagnosis not present

## 2021-05-23 DIAGNOSIS — M329 Systemic lupus erythematosus, unspecified: Secondary | ICD-10-CM | POA: Diagnosis not present

## 2021-05-23 DIAGNOSIS — D849 Immunodeficiency, unspecified: Secondary | ICD-10-CM | POA: Diagnosis not present

## 2021-05-23 DIAGNOSIS — Z0000 Encounter for general adult medical examination without abnormal findings: Secondary | ICD-10-CM | POA: Diagnosis not present

## 2021-05-23 DIAGNOSIS — D696 Thrombocytopenia, unspecified: Secondary | ICD-10-CM | POA: Diagnosis not present

## 2021-05-23 DIAGNOSIS — I10 Essential (primary) hypertension: Secondary | ICD-10-CM | POA: Diagnosis not present

## 2021-05-23 DIAGNOSIS — M1990 Unspecified osteoarthritis, unspecified site: Secondary | ICD-10-CM | POA: Diagnosis not present

## 2021-05-30 NOTE — Telephone Encounter (Signed)
I called patient, patient verbalized understanding. 

## 2021-05-30 NOTE — Telephone Encounter (Signed)
Anxiety is not a common side effect with cellcept.  Dr. Corliss Skains and I both recommend following up with PCP to discuss anxiolytics since we do not routinely treat anxiety.

## 2021-06-05 ENCOUNTER — Telehealth: Payer: Self-pay | Admitting: *Deleted

## 2021-06-05 DIAGNOSIS — Z79899 Other long term (current) drug therapy: Secondary | ICD-10-CM

## 2021-06-05 DIAGNOSIS — D693 Immune thrombocytopenic purpura: Secondary | ICD-10-CM

## 2021-06-05 DIAGNOSIS — R760 Raised antibody titer: Secondary | ICD-10-CM

## 2021-06-05 DIAGNOSIS — E8889 Other specified metabolic disorders: Secondary | ICD-10-CM

## 2021-06-05 DIAGNOSIS — M329 Systemic lupus erythematosus, unspecified: Secondary | ICD-10-CM

## 2021-06-05 NOTE — Telephone Encounter (Signed)
I returned patient's call.  Patient states that she cannot tolerate CellCept.  She is having joint pain and sores in her nose.  She is also experiencing fatigue.  She states her platelet count was lower in 180s.  She is unable to tolerate hydroxychloroquine.  I advised her to come in for lab work which will include CBC with differential, CMP with GFR, urine protein creatinine ratio, dsDNA, C3-C4, sed rate.  We also discussed possible referral to [facility] and patient was in agreement.  Please place an urgent referral to [facility] rheumatology.    Please schedule a follow-up visit with [place] in the next couple of weeks.

## 2021-06-05 NOTE — Telephone Encounter (Signed)
Referral placed to [facility] Rheumatology. Future order placed for labs.

## 2021-06-05 NOTE — Addendum Note (Signed)
Addended by: Henriette Combs on: 06/05/2021 01:46 PM   Modules accepted: Orders

## 2021-06-05 NOTE — Telephone Encounter (Signed)
Patient contacted the office and states she is on Cellcept for her Lupus. Patient states she has been on it for about 3 weeks. Patient states she is not going to be able to continue to take it. Patient states she is having diarrhea, sores in her nose and body aches that are all day everyday. Patient states she had lab work with her PCP recently which showed a significant decrease in her platelets. Patient is having PCP send a copy of her labs for review. Please advise.

## 2021-06-11 ENCOUNTER — Other Ambulatory Visit: Payer: Self-pay | Admitting: Oncology

## 2021-06-20 ENCOUNTER — Other Ambulatory Visit: Payer: Self-pay | Admitting: Student

## 2021-06-20 DIAGNOSIS — Z8673 Personal history of transient ischemic attack (TIA), and cerebral infarction without residual deficits: Secondary | ICD-10-CM

## 2021-06-20 DIAGNOSIS — R0989 Other specified symptoms and signs involving the circulatory and respiratory systems: Secondary | ICD-10-CM

## 2021-06-23 ENCOUNTER — Telehealth: Payer: Self-pay | Admitting: *Deleted

## 2021-06-23 ENCOUNTER — Other Ambulatory Visit: Payer: Self-pay | Admitting: Internal Medicine

## 2021-06-23 MED ORDER — LOSARTAN POTASSIUM-HCTZ 100-25 MG PO TABS: 1.0000 | ORAL_TABLET | Freq: Every day | ORAL | 0 refills | Status: AC

## 2021-06-23 NOTE — Telephone Encounter (Signed)
New prescription sent to pharmacy on file  

## 2021-06-24 ENCOUNTER — Other Ambulatory Visit: Payer: Self-pay | Admitting: Student

## 2021-06-24 DIAGNOSIS — Z1231 Encounter for screening mammogram for malignant neoplasm of breast: Secondary | ICD-10-CM

## 2021-06-27 ENCOUNTER — Ambulatory Visit
Admission: RE | Admit: 2021-06-27 | Discharge: 2021-06-27 | Disposition: A | Discharge status: Home / Self Care | Payer: Medicare Other | Source: Ambulatory Visit | Attending: Student | Admitting: Student

## 2021-06-27 DIAGNOSIS — Z8673 Personal history of transient ischemic attack (TIA), and cerebral infarction without residual deficits: Secondary | ICD-10-CM

## 2021-06-27 DIAGNOSIS — R0989 Other specified symptoms and signs involving the circulatory and respiratory systems: Secondary | ICD-10-CM

## 2021-07-02 ENCOUNTER — Ambulatory Visit
Admission: RE | Admit: 2021-07-02 | Discharge: 2021-07-02 | Disposition: A | Discharge status: Home / Self Care | Payer: Medicare Other | Source: Ambulatory Visit | Attending: Student | Admitting: Student

## 2021-07-02 DIAGNOSIS — Z1231 Encounter for screening mammogram for malignant neoplasm of breast: Secondary | ICD-10-CM

## 2021-07-29 ENCOUNTER — Other Ambulatory Visit: Payer: Self-pay | Admitting: Internal Medicine

## 2021-08-18 ENCOUNTER — Telehealth: Payer: Self-pay | Admitting: Hematology

## 2021-08-18 NOTE — Telephone Encounter (Signed)
Rescheduled upcoming appointment per patient's request. Patient is aware of changes. 

## 2021-08-22 ENCOUNTER — Other Ambulatory Visit: Payer: Self-pay | Admitting: *Deleted

## 2021-08-22 DIAGNOSIS — D693 Immune thrombocytopenic purpura: Secondary | ICD-10-CM

## 2021-08-25 ENCOUNTER — Inpatient Hospital Stay: Payer: Medicare Other | Attending: Hematology

## 2021-08-25 ENCOUNTER — Other Ambulatory Visit: Payer: Self-pay

## 2021-08-25 DIAGNOSIS — D693 Immune thrombocytopenic purpura: Secondary | ICD-10-CM | POA: Insufficient documentation

## 2021-08-25 LAB — RETIC PANEL
Immature Retic Fract: 24.2 % — ABNORMAL HIGH (ref 2.3–15.9)
RBC.: 3.99 MIL/uL (ref 3.87–5.11)
Retic Count, Absolute: 64.6 10*3/uL (ref 19.0–186.0)
Retic Ct Pct: 1.6 % (ref 0.4–3.1)
Reticulocyte Hemoglobin: 33 pg (ref >27.9)

## 2021-08-25 LAB — CBC WITH DIFFERENTIAL (CANCER CENTER ONLY)
Abs Immature Granulocytes: 0.01 10*3/uL (ref 0.00–0.07)
Basophils Absolute: 0 10*3/uL (ref 0.0–0.1)
Basophils Relative: 0 %
Eosinophils Absolute: 0.1 10*3/uL (ref 0.0–0.5)
Eosinophils Relative: 3 %
HCT: 36.5 % (ref 36.0–46.0)
Hemoglobin: 12.3 g/dL (ref 12.0–15.0)
Immature Granulocytes: 0 %
Lymphocytes Relative: 42 %
Lymphs Abs: 1.3 10*3/uL (ref 0.7–4.0)
MCH: 30.2 pg (ref 26.0–34.0)
MCHC: 33.7 g/dL (ref 30.0–36.0)
MCV: 89.7 fL (ref 80.0–100.0)
Monocytes Absolute: 0.4 10*3/uL (ref 0.1–1.0)
Monocytes Relative: 14 %
Neutro Abs: 1.2 10*3/uL — ABNORMAL LOW (ref 1.7–7.7)
Neutrophils Relative %: 41 %
Platelet Count: 149 10*3/uL — ABNORMAL LOW (ref 150–400)
RBC: 4.07 MIL/uL (ref 3.87–5.11)
RDW: 13.9 % (ref 11.5–15.5)
WBC Count: 3 10*3/uL — ABNORMAL LOW (ref 4.0–10.5)
nRBC: 0 % (ref 0.0–0.2)

## 2021-08-25 LAB — CMP (CANCER CENTER ONLY)
ALT: 16 U/L (ref 0–44)
AST: 21 U/L (ref 15–41)
Albumin: 3.9 g/dL (ref 3.5–5.0)
Alkaline Phosphatase: 80 U/L (ref 38–126)
Anion gap: 5 (ref 5–15)
BUN: 16 mg/dL (ref 8–23)
CO2: 28 mmol/L (ref 22–32)
Calcium: 9.3 mg/dL (ref 8.9–10.3)
Chloride: 108 mmol/L (ref 98–111)
Creatinine: 0.89 mg/dL (ref 0.44–1.00)
GFR, Estimated: >60 mL/min (ref >60)
Glucose, Bld: 95 mg/dL (ref 70–99)
Potassium: 3.6 mmol/L (ref 3.5–5.1)
Sodium: 141 mmol/L (ref 135–145)
Total Bilirubin: 0.7 mg/dL (ref 0.3–1.2)
Total Protein: 6.6 g/dL (ref 6.5–8.1)

## 2021-08-26 ENCOUNTER — Inpatient Hospital Stay (HOSPITAL_BASED_OUTPATIENT_CLINIC_OR_DEPARTMENT_OTHER): Payer: Medicare Other | Admitting: Hematology

## 2021-08-26 DIAGNOSIS — D693 Immune thrombocytopenic purpura: Secondary | ICD-10-CM | POA: Diagnosis not present

## 2021-08-26 NOTE — Progress Notes (Signed)
HEMATOLOGY/ONCOLOGY PHONE VISIT NOTE  Date of Service: 08/26/2021   Patient Care Team: Myrlene Broker, MD as PCP - General (Internal Medicine)  REFERRING PHYSICIAN: Myrlene Broker, *  CHIEF COMPLAINTS/PURPOSE OF CONSULTATION:  Follow-up for continued evaluation and management of ITP related to SLE   INTERVAL HISTORY I connected with Elaine Allen on 08/26/2021 at 8:40 AM EST by telephone visit and verified that I am speaking with the correct person using two identifiers.   I discussed the limitations, risks, security and privacy concerns of performing an evaluation and management service by telemedicine and the availability of in-person appointments. I also discussed with the patient that there may be a patient responsible charge related to this service. The patient expressed understanding and agreed to proceed.   Other persons participating in the visit and their role in the encounter: None  Patient's location: Home Provider's location: [facility]  Elaine Allen is a 66 y.o. female who was contacted via phone to follow up for her ITP and review recent labs. She reports She is doing well with no new symptoms or concerns.  She notes that she was taken off the Cellcept and prednisone by her rheumatologist Dr. Corliss Skains because she was not tolerating them well with severe nausea and heartburn. She notes recent acute SLE flare ups and we discussed f/u with rheumatology for further evaluation and management. She has a referral for rheumatology at [facility] and will be seen by them.  No issues with infections. No issues with abnormal bleeding or bruising. No other new or acute focal symptoms.  Labs done 08/25/2021 were reviewed in detail.  MEDICAL HISTORY:  Past Medical History:  Diagnosis Date   Arthritis    Blind right eye    sees colors   Chronic anemia    Detached retina, right    corrected good for about 6 months sees peripheral  colors only   History of kidney stones    Hypertension    Hyperthyroidism    Lupus (HCC)    Pneumonia    Retinal detachment    Spasmodic dysphonia    Stroke (HCC) 2015   no permanant defecits had weakness in left arm in hospital Had PT also   Vertigo    Hx of preceding stroke     SURGICAL HISTORY: Past Surgical History:  Procedure Laterality Date   ABDOMINAL HYSTERECTOMY     BREAST EXCISIONAL BIOPSY Left    KNEE SURGERY Left    THYROID SURGERY     TOTAL HIP ARTHROPLASTY Left 06/27/2018   Procedure: TOTAL HIP ARTHROPLASTY ANTERIOR APPROACH;  Surgeon: Gean Birchwood, MD;  Location: [facility];  Service: Orthopedics;  Laterality: Left;     SOCIAL HISTORY: Social History   Socioeconomic History   Marital status: Single    Spouse name: Not on file   Number of children: 2   Years of education: college   Highest education level: Not on file  Occupational History   Occupation: options  Tobacco Use   Smoking status: Never    Passive exposure: Never   Smokeless tobacco: Never  Vaping Use   Vaping Use: Never used  Substance and Sexual Activity   Alcohol use: No   Drug use: No   Sexual activity: Not Currently  Other Topics Concern   Not on file  Social History Narrative   Not on file   Social Determinants of Health   Financial Resource Strain: Low Risk  (08/13/2020)  Overall Financial Resource Strain (CARDIA)    Difficulty of Paying Living Expenses: Not hard at all  Food Insecurity: No Food Insecurity (08/13/2020)   Hunger Vital Sign    Worried About Running Out of Food in the Last Year: Never true    Ran Out of Food in the Last Year: Never true  Transportation Needs: No Transportation Needs (08/13/2020)   PRAPARE - Administrator, Civil Service (Medical): No    Lack of Transportation (Non-Medical): No  Physical Activity: Sufficiently Active (08/13/2020)   Exercise Vital Sign    Days of Exercise per Week: 5 days    Minutes of Exercise per Session: 30 min   Stress: No Stress Concern Present (08/13/2020)   [employer] of Occupational Health - Occupational Stress Questionnaire    Feeling of Stress : Not at all  Social Connections: Moderately Integrated (08/13/2020)   Social Connection and Isolation Panel [NHANES]    Frequency of Communication with Friends and Family: More than three times a week    Frequency of Social Gatherings with Friends and Family: More than three times a week    Attends Religious Services: More than 4 times per year    Active Member of [employer] or Organizations: Yes    Attends Engineer, structural: More than 4 times per year    Marital Status: Divorced  Intimate Partner Violence: Not At Risk (08/13/2020)   Humiliation, Afraid, Rape, and Kick questionnaire    Fear of Current or Ex-Partner: No    Emotionally Abused: No    Physically Abused: No    Sexually Abused: No     FAMILY HISTORY: Family History  Problem Relation Age of Onset   Cancer Mother        liver   Diabetes Mother    Arthritis Mother    Cancer Father        prostate,METS, stomach    Ovarian cancer Sister    Throat cancer Brother      ALLERGIES:   is allergic to plaquenil [hydroxychloroquine sulfate], azithromycin, lactose intolerance (gi), and penicillin g.   MEDICATIONS:  Current Outpatient Medications  Medication Sig Dispense Refill   diclofenac Sodium (VOLTAREN) 1 % GEL Apply 2 g topically 4 (four) times daily. (Patient not taking: Reported on 03/03/2021) 100 g 3   losartan-hydrochlorothiazide (HYZAAR) 100-25 MG tablet Take 1 tablet by mouth daily. 30 tablet 0   mycophenolate (CELLCEPT) 500 MG tablet Take 1 tablet (500 mg total) by mouth daily. 30 tablet 2   potassium chloride SA (KLOR-CON) 20 MEQ tablet Take 1 tablet (20 mEq total) by mouth daily. (Patient not taking: Reported on 09/16/2020) 3 tablet 0   predniSONE (DELTASONE) 5 MG tablet Take 4 tablets by mouth daily x4 days, 3 tablets daily x4 days, 2 tablets daily x4 days, 1  tablet daily x4 days. 40 tablet 0   Probiotic Product (PROBIOTIC PO) Take 1 capsule by mouth daily.     vitamin B-12 (CYANOCOBALAMIN) 1000 MCG tablet Take 1,000 mcg by mouth daily.     No current facility-administered medications for this visit.    REVIEW OF SYSTEMS:    No abnormal bleeding or bruising   PHYSICAL EXAMINATION: Telemedicine visit  LABORATORY DATA:  I have reviewed the data as listed     Latest Ref Rng & Units 08/25/2021    8:36 AM 04/23/2021    9:21 AM 02/12/2021    2:08 PM  CBC  WBC 4.0 - 10.5 K/uL 3.0  2.9  2.4   Hemoglobin 12.0 - 15.0 g/dL 65.5  37.4  82.7   Hematocrit 36.0 - 46.0 % 36.5  41.9  36.4   Platelets 150 - 400 K/uL 149  178  191   ANC 1300     Latest Ref Rng & Units 08/25/2021    8:36 AM 04/23/2021    9:21 AM 02/12/2021    2:08 PM  CMP  Glucose 70 - 99 mg/dL 95  078  97   BUN 8 - 23 mg/dL 16  17  19    Creatinine 0.44 - 1.00 mg/dL  6.75  4.49   Sodium 135 - 145 mmol/L 141  141  141   Potassium 3.5 - 5.1 mmol/L 3.6  3.9  3.7   Chloride 98 - 111 mmol/L 108  104  107   CO2 22 - 32 mmol/L 28  30  28    Calcium 8.9 - 10.3 mg/dL 9.3  9.8  9.2   Total Protein 6.5 - 8.1 g/dL 6.6  8.0  7.6   Total Bilirubin 0.3 - 1.2 mg/dL 0.7  0.9  0.7   Alkaline Phos 38 - 126 U/L 80  127  91   AST 15 - 41 U/L 21  32  31   ALT 0 - 44 U/L 16  23  23     RADIOGRAPHIC STUDIES: I have personally reviewed the radiological images as listed and agreed with the findings in the report. No results found.  ASSESSMENT & PLAN:   Elaine Allen is a 66 y.o. female with:  1.  H/o Relapsing ITP due to lupus -Likely related to Immune thrombocytopenia in the setting of lupus. Patient was treated for her relapsed ITP with 4 doses of Rituxan again. Plan -Labs done on 08/25/2021 showed no evidence of relapse of the patient's ITP at this time she had normal platelet counts normal hemoglobin and mild leukopenia/neutropenia likely related to her lupus. -No indication for  additional treatment for the patient's ITP at this time. -She should continue to follow-up with rheumatology to optimize treatment for her SLE since this will have a bearing on her ITP relapse risk. -We discussed f/u with rheumatology for further evaluation and management. She has a referral for rheumatology at [facility] and will be seen by them.  2. Lupus - Was previously on Imuran.  Currently not on any medications Currently on prednisone.  Was prescribed CellCept in February 2023 but has not received the medication or started the medication yet. PLan -Continue follow-up with Dr. 08/27/2021 for continued rheumatologic cares.  3. H/o AVN -  related to steroids s/p left THA.  4. Lupus anticoagulant positive-history of old stroke but that was related to vertebral artery dissection. -Reasonable to consider baby aspirin 81 mg p.o. daily.  Will defer this to her primary care physician.  5.  Leukopenia/neutropenia -likely autoimmune related to lupus.   FOLLOW UP Phone visit with Dr. [facility] in 3 months Labs  1 day prior to phone visit  The total time spent in the appointment was 15 minutes*.  All of the patient's questions were answered with apparent satisfaction. The patient knows to call the clinic with any problems, questions or concerns.   March 2023 MD MS AAHIVMS [facility] Hematology/Oncology Physician [facility]  .*Total Encounter Time as defined by the Centers for Medicare and Medicaid Services includes, in addition to the face-to-face time of a patient visit (documented in the note above) non-face-to-face time: obtaining and reviewing outside  history, ordering and reviewing medications, tests or procedures, care coordination (communications with other health care professionals or caregivers) and documentation in the medical record.  I, Bernestine Amass, am acting as scribe for Dr. Wyvonnia Lora, MD.  .I have reviewed the above documentation for accuracy and completeness,  and I agree with the above.Johney Maine MD

## 2021-08-28 ENCOUNTER — Telehealth: Payer: Self-pay | Admitting: Hematology

## 2021-08-28 NOTE — Telephone Encounter (Signed)
Scheduled follow-up appointments per 8/22 los. Patient is aware. 

## 2021-09-04 ENCOUNTER — Other Ambulatory Visit: Payer: Medicare Other

## 2021-09-05 ENCOUNTER — Telehealth: Payer: Medicare Other | Admitting: Hematology

## 2021-11-24 ENCOUNTER — Other Ambulatory Visit: Payer: Self-pay

## 2021-11-24 DIAGNOSIS — D693 Immune thrombocytopenic purpura: Secondary | ICD-10-CM

## 2021-11-25 ENCOUNTER — Inpatient Hospital Stay: Payer: Medicare Other | Attending: Hematology

## 2021-11-25 DIAGNOSIS — D6862 Lupus anticoagulant syndrome: Secondary | ICD-10-CM | POA: Diagnosis not present

## 2021-11-25 DIAGNOSIS — D693 Immune thrombocytopenic purpura: Secondary | ICD-10-CM | POA: Insufficient documentation

## 2021-11-25 LAB — CBC WITH DIFFERENTIAL (CANCER CENTER ONLY)
Abs Immature Granulocytes: 0.01 10*3/uL (ref 0.00–0.07)
Basophils Absolute: 0 10*3/uL (ref 0.0–0.1)
Basophils Relative: 1 %
Eosinophils Absolute: 0.1 10*3/uL (ref 0.0–0.5)
Eosinophils Relative: 2 %
HCT: 41.6 % (ref 36.0–46.0)
Hemoglobin: 13.8 g/dL (ref 12.0–15.0)
Immature Granulocytes: 0 %
Lymphocytes Relative: 43 %
Lymphs Abs: 1.3 10*3/uL (ref 0.7–4.0)
MCH: 29.5 pg (ref 26.0–34.0)
MCHC: 33.2 g/dL (ref 30.0–36.0)
MCV: 88.9 fL (ref 80.0–100.0)
Monocytes Absolute: 0.5 10*3/uL (ref 0.1–1.0)
Monocytes Relative: 17 %
Neutro Abs: 1.1 10*3/uL — ABNORMAL LOW (ref 1.7–7.7)
Neutrophils Relative %: 37 %
Platelet Count: 106 10*3/uL — ABNORMAL LOW (ref 150–400)
RBC: 4.68 MIL/uL (ref 3.87–5.11)
RDW: 13.4 % (ref 11.5–15.5)
WBC Count: 3 10*3/uL — ABNORMAL LOW (ref 4.0–10.5)
nRBC: 0 % (ref 0.0–0.2)

## 2021-11-25 LAB — CMP (CANCER CENTER ONLY)
ALT: 19 U/L (ref 0–44)
AST: 26 U/L (ref 15–41)
Albumin: 4.6 g/dL (ref 3.5–5.0)
Alkaline Phosphatase: 105 U/L (ref 38–126)
Anion gap: 7 (ref 5–15)
BUN: 15 mg/dL (ref 8–23)
CO2: 29 mmol/L (ref 22–32)
Calcium: 10 mg/dL (ref 8.9–10.3)
Chloride: 102 mmol/L (ref 98–111)
Creatinine: 0.82 mg/dL (ref 0.44–1.00)
GFR, Estimated: >60 mL/min (ref >60)
Glucose, Bld: 105 mg/dL — ABNORMAL HIGH (ref 70–99)
Potassium: 3.7 mmol/L (ref 3.5–5.1)
Sodium: 138 mmol/L (ref 135–145)
Total Bilirubin: 0.8 mg/dL (ref 0.3–1.2)
Total Protein: 7.9 g/dL (ref 6.5–8.1)

## 2021-11-25 LAB — RETIC PANEL
Immature Retic Fract: 24.5 % — ABNORMAL HIGH (ref 2.3–15.9)
RBC.: 4.59 MIL/uL (ref 3.87–5.11)
Retic Count, Absolute: 79.4 10*3/uL (ref 19.0–186.0)
Retic Ct Pct: 1.7 % (ref 0.4–3.1)
Reticulocyte Hemoglobin: 32.6 pg (ref >27.9)

## 2021-11-26 ENCOUNTER — Telehealth: Payer: Self-pay | Admitting: Rheumatology

## 2021-11-26 ENCOUNTER — Inpatient Hospital Stay (HOSPITAL_BASED_OUTPATIENT_CLINIC_OR_DEPARTMENT_OTHER): Payer: Medicare Other | Admitting: Hematology

## 2021-11-26 DIAGNOSIS — D693 Immune thrombocytopenic purpura: Secondary | ICD-10-CM | POA: Diagnosis not present

## 2021-11-26 NOTE — Telephone Encounter (Signed)
error 

## 2021-11-26 NOTE — Progress Notes (Signed)
HEMATOLOGY/ONCOLOGY PHONE VISIT NOTE  Date of Service: 11/26/2021   Patient Care Team: Myrlene Brokerrawford, Elizabeth A, MD as PCP - General (Internal Medicine)  REFERRING PHYSICIAN: Myrlene Brokerrawford, Elizabeth A, *  CHIEF COMPLAINTS/PURPOSE OF CONSULTATION:  Follow-up for continued evaluation and management of ITP related to SLE   INTERVAL HISTORY  I connected with Elaine PlumberJill Lamour [place] on 11/26/2021 at 8:40 AM EST by telephone visit and verified that I am speaking with the correct person using two identifiers.  Elaine Allen is a 66 y.o. female who was contacted via phone to follow up for her ITP and review recent labs. She reports She is doing well with no new symptoms or concerns. I had a phone visit with the patient on 08/26/2021 and she was doing well without any medical concerns.  Patient reports she is doing well since our last visit. She notes her Lupus has been moderate. She notes she was taking Cellcept for a short period of time, but discontinued medication due to side effect which included severe headaches. Patient denies taking any medication for Lupus as of right now.   She is not taking any OTC medications and denies any new viral infections that could drop platelet count.  Patient had questions regarding Lupus, platelet counts, and treatment options, which were answered.   I discussed the limitations, risks, security and privacy concerns of performing an evaluation and management service by telemedicine and the availability of in-person appointments. I also discussed with the patient that there may be a patient responsible charge related to this service. The patient expressed understanding and agreed to proceed.   Other persons participating in the visit and their role in the encounter: None  Patient's location: Home Provider's location: [facility]    MEDICAL HISTORY:  Past Medical History:  Diagnosis Date   Arthritis    Blind right eye    sees colors    Chronic anemia    Detached retina, right    corrected good for about 6 months sees peripheral colors only   History of kidney stones    Hypertension    Hyperthyroidism    Lupus (HCC)    Pneumonia    Retinal detachment    Spasmodic dysphonia    Stroke (HCC) 2015   no permanant defecits had weakness in left arm in hospital Had PT also   Vertigo    Hx of preceding stroke     SURGICAL HISTORY: Past Surgical History:  Procedure Laterality Date   ABDOMINAL HYSTERECTOMY     BREAST EXCISIONAL BIOPSY Left    KNEE SURGERY Left    THYROID SURGERY     TOTAL HIP ARTHROPLASTY Left 06/27/2018   Procedure: TOTAL HIP ARTHROPLASTY ANTERIOR APPROACH;  Surgeon: Gean Birchwoodowan, Frank, MD;  Location: [facility];  Service: Orthopedics;  Laterality: Left;     SOCIAL HISTORY: Social History   Socioeconomic History   Marital status: Single    Spouse name: Not on file   Number of children: 2   Years of education: college   Highest education level: Not on file  Occupational History   Occupation: options  Tobacco Use   Smoking status: Never    Passive exposure: Never   Smokeless tobacco: Never  Vaping Use   Vaping Use: Never used  Substance and Sexual Activity   Alcohol use: No   Drug use: No   Sexual activity: Not Currently  Other Topics Concern   Not on file  Social History Narrative   Not  on file   Social Determinants of Health   Financial Resource Strain: Low Risk  (08/13/2020)   Overall Financial Resource Strain (CARDIA)    Difficulty of Paying Living Expenses: Not hard at all  Food Insecurity: No Food Insecurity (08/13/2020)   Hunger Vital Sign    Worried About Running Out of Food in the Last Year: Never true    Ran Out of Food in the Last Year: Never true  Transportation Needs: No Transportation Needs (08/13/2020)   PRAPARE - Administrator, Civil Service (Medical): No    Lack of Transportation (Non-Medical): No  Physical Activity: Sufficiently Active (08/13/2020)   Exercise  Vital Sign    Days of Exercise per Week: 5 days    Minutes of Exercise per Session: 30 min  Stress: No Stress Concern Present (08/13/2020)   [employer] of Occupational Health - Occupational Stress Questionnaire    Feeling of Stress : Not at all  Social Connections: Moderately Integrated (08/13/2020)   Social Connection and Isolation Panel [NHANES]    Frequency of Communication with Friends and Family: More than three times a week    Frequency of Social Gatherings with Friends and Family: More than three times a week    Attends Religious Services: More than 4 times per year    Active Member of [employer] or Organizations: Yes    Attends Engineer, structural: More than 4 times per year    Marital Status: Divorced  Intimate Partner Violence: Not At Risk (08/13/2020)   Humiliation, Afraid, Rape, and Kick questionnaire    Fear of Current or Ex-Partner: No    Emotionally Abused: No    Physically Abused: No    Sexually Abused: No     FAMILY HISTORY: Family History  Problem Relation Age of Onset   Cancer Mother        liver   Diabetes Mother    Arthritis Mother    Cancer Father        prostate,METS, stomach    Ovarian cancer Sister    Throat cancer Brother      ALLERGIES:   is allergic to plaquenil [hydroxychloroquine sulfate], azithromycin, lactose intolerance (gi), and penicillin g.   MEDICATIONS:  Current Outpatient Medications  Medication Sig Dispense Refill   diclofenac Sodium (VOLTAREN) 1 % GEL Apply 2 g topically 4 (four) times daily. (Patient not taking: Reported on 03/03/2021) 100 g 3   losartan-hydrochlorothiazide (HYZAAR) 100-25 MG tablet Take 1 tablet by mouth daily. 30 tablet 0   mycophenolate (CELLCEPT) 500 MG tablet Take 1 tablet (500 mg total) by mouth daily. 30 tablet 2   potassium chloride SA (KLOR-CON) 20 MEQ tablet Take 1 tablet (20 mEq total) by mouth daily. (Patient not taking: Reported on 09/16/2020) 3 tablet 0   predniSONE (DELTASONE) 5 MG tablet  Take 4 tablets by mouth daily x4 days, 3 tablets daily x4 days, 2 tablets daily x4 days, 1 tablet daily x4 days. 40 tablet 0   Probiotic Product (PROBIOTIC PO) Take 1 capsule by mouth daily.     vitamin B-12 (CYANOCOBALAMIN) 1000 MCG tablet Take 1,000 mcg by mouth daily.     No current facility-administered medications for this visit.    REVIEW OF SYSTEMS:    No abnormal bleeding or bruising   PHYSICAL EXAMINATION: Telemedicine visit  LABORATORY DATA:  I have reviewed the data as listed     Latest Ref Rng & Units 11/25/2021    8:32 AM 08/25/2021  8:36 AM 04/23/2021    9:21 AM  CBC  WBC 4.0 - 10.5 K/uL 3.0  3.0  2.9   Hemoglobin 12.0 - 15.0 g/dL 16.1  09.6  04.5   Hematocrit 36.0 - 46.0 % 41.6  36.5  41.9   Platelets 150 - 400 K/uL 106  149  178   ANC 1300     Latest Ref Rng & Units 11/25/2021    8:32 AM 08/25/2021    8:36 AM 04/23/2021    9:21 AM  CMP  Glucose 70 - 99 mg/dL 409  95  811   BUN 8 - 23 mg/dL 15  16  17    Creatinine 0.44 - 1.00 mg/dL  9.14  7.82   Sodium 135 - 145 mmol/L 138  141  141   Potassium 3.5 - 5.1 mmol/L 3.7  3.6  3.9   Chloride 98 - 111 mmol/L 102  108  104   CO2 22 - 32 mmol/L 29  28  30    Calcium 8.9 - 10.3 mg/dL 9.56  9.3  9.8   Total Protein 6.5 - 8.1 g/dL 7.9  6.6  8.0   Total Bilirubin 0.3 - 1.2 mg/dL 0.8  0.7  0.9   Alkaline Phos 38 - 126 U/L 105  80  127   AST 15 - 41 U/L 26  21  32   ALT 0 - 44 U/L 19  16  23     RADIOGRAPHIC STUDIES: I have personally reviewed the radiological images as listed and agreed with the findings in the report. No results found.  ASSESSMENT & PLAN:   Elaine Allen is a 66 y.o. female with:  1.  H/o Relapsing ITP due to lupus -Likely related to Immune thrombocytopenia in the setting of lupus. Patient was treated for her relapsed ITP with 4 doses of Rituxan again. Plan -Labs done on 08/25/2021 showed no evidence of relapse of the patient's ITP at this time she had normal platelet counts  normal hemoglobin and mild leukopenia/neutropenia likely related to her lupus. -No indication for additional treatment for the patient's ITP at this time. -She should continue to follow-up with rheumatology to optimize treatment for her SLE since this will have a bearing on her ITP relapse risk. -We discussed f/u with rheumatology for further evaluation and management. She has a referral for rheumatology at [facility] and will be seen by them.  2. Lupus - Was previously on Imuran.  Currently not on any medications Currently on prednisone.  Was prescribed CellCept in February 2023 but has not received the medication or started the medication yet. 3. H/o AVN -  related to steroids s/p left THA.  4. Lupus anticoagulant positive-history of old stroke but that was related to vertebral artery dissection. -Reasonable to consider baby aspirin 81 mg p.o. daily.  Will defer this to her primary care physician.  5.  Leukopenia/neutropenia -likely autoimmune related to lupus.   Plan: -Discussed lab results from 11/25/2021 in detail with the patient. Labs shows WBC count of 3.0 K, hemoglobin of 13.0 K, and platelets of 106 K. Platelets are dropping.  -Educated patient that the overall activity level of her SLE has a bearing on her ITP relapse. -patient notes that she has notfollowed with Dr [facility] for >6 months for her SLE treatment and does not currently have a treatment plan for this. She also notes that she was to be referred to [facility] rheumatology but has not received a referral yet. -she  was recommended to f/u with Dr Corliss Skains for recommendation regarding her SLE. -we also discussed we could potentially consider Rituxan for management of her SLE and to keep her ITP from relapsing if Dr. Corliss Skains considers this to be reasonable treatment for her SLE as well. -Continue follow-up with Dr. Corliss Skains for continued rheumatologic cares.   FOLLOW UP: Phone visit in 6 weeks with labs 1 day prior  to phone visit. F/u with rheumatology to determine treatment plan for lupus.  The total time spent in the appointment was 20 minutes* .  All of the patient's questions were answered with apparent satisfaction. The patient knows to call the clinic with any problems, questions or concerns.   Wyvonnia Lora MD MS AAHIVMS [facility] Hematology/Oncology Physician [facility]  .*Total Encounter Time as defined by the Centers for Medicare and Medicaid Services includes, in addition to the face-to-face time of a patient visit (documented in the note above) non-face-to-face time: obtaining and reviewing outside history, ordering and reviewing medications, tests or procedures, care coordination (communications with other health care professionals or caregivers) and documentation in the medical record.   I, Ok Edwards, am acting as a Neurosurgeon for Wyvonnia Lora, MD.  .I have reviewed the above documentation for accuracy and completeness, and I agree with the above. Johney Maine MD

## 2021-12-09 ENCOUNTER — Telehealth: Payer: Self-pay

## 2021-12-09 NOTE — Telephone Encounter (Signed)
Per Dr. Corliss Skains, please call [facility] tomorrow to see about getting patient a sooner appointment than March.

## 2021-12-10 NOTE — Telephone Encounter (Signed)
I called to [facility] to inquire about patient getting a sooner appointment since she is currently not on any treatment. I was informed there are not any sooner appointments but the patient is on a cancellation list.

## 2021-12-15 ENCOUNTER — Other Ambulatory Visit: Payer: Self-pay | Admitting: Student

## 2021-12-15 DIAGNOSIS — R109 Unspecified abdominal pain: Secondary | ICD-10-CM

## 2022-01-06 ENCOUNTER — Ambulatory Visit
Admission: RE | Admit: 2022-01-06 | Discharge: 2022-01-06 | Disposition: A | Discharge status: Home / Self Care | Payer: Medicare Other | Source: Ambulatory Visit | Attending: Student | Admitting: Student

## 2022-01-06 DIAGNOSIS — R109 Unspecified abdominal pain: Secondary | ICD-10-CM

## 2022-01-07 ENCOUNTER — Other Ambulatory Visit: Payer: Self-pay | Admitting: *Deleted

## 2022-01-07 DIAGNOSIS — D693 Immune thrombocytopenic purpura: Secondary | ICD-10-CM

## 2022-01-08 ENCOUNTER — Inpatient Hospital Stay: Payer: Medicare Other | Attending: Hematology

## 2022-01-08 ENCOUNTER — Other Ambulatory Visit: Payer: Self-pay

## 2022-01-08 DIAGNOSIS — Z79899 Other long term (current) drug therapy: Secondary | ICD-10-CM | POA: Insufficient documentation

## 2022-01-08 DIAGNOSIS — D6862 Lupus anticoagulant syndrome: Secondary | ICD-10-CM | POA: Diagnosis not present

## 2022-01-08 DIAGNOSIS — Z7982 Long term (current) use of aspirin: Secondary | ICD-10-CM | POA: Insufficient documentation

## 2022-01-08 DIAGNOSIS — M329 Systemic lupus erythematosus, unspecified: Secondary | ICD-10-CM | POA: Diagnosis not present

## 2022-01-08 DIAGNOSIS — D72819 Decreased white blood cell count, unspecified: Secondary | ICD-10-CM | POA: Diagnosis not present

## 2022-01-08 DIAGNOSIS — D693 Immune thrombocytopenic purpura: Secondary | ICD-10-CM | POA: Insufficient documentation

## 2022-01-08 LAB — RETIC PANEL
Immature Retic Fract: 24.2 % — ABNORMAL HIGH (ref 2.3–15.9)
RBC.: 4.41 MIL/uL (ref 3.87–5.11)
Retic Count, Absolute: 72.8 10*3/uL (ref 19.0–186.0)
Retic Ct Pct: 1.7 % (ref 0.4–3.1)
Reticulocyte Hemoglobin: 32.6 pg (ref >27.9)

## 2022-01-08 LAB — CBC WITH DIFFERENTIAL (CANCER CENTER ONLY)
Abs Immature Granulocytes: 0.01 10*3/uL (ref 0.00–0.07)
Basophils Absolute: 0 10*3/uL (ref 0.0–0.1)
Basophils Relative: 0 %
Eosinophils Absolute: 0.1 10*3/uL (ref 0.0–0.5)
Eosinophils Relative: 2 %
HCT: 40.7 % (ref 36.0–46.0)
Hemoglobin: 13.5 g/dL (ref 12.0–15.0)
Immature Granulocytes: 0 %
Lymphocytes Relative: 44 %
Lymphs Abs: 1.4 10*3/uL (ref 0.7–4.0)
MCH: 30.2 pg (ref 26.0–34.0)
MCHC: 33.2 g/dL (ref 30.0–36.0)
MCV: 91.1 fL (ref 80.0–100.0)
Monocytes Absolute: 0.5 10*3/uL (ref 0.1–1.0)
Monocytes Relative: 17 %
Neutro Abs: 1.2 10*3/uL — ABNORMAL LOW (ref 1.7–7.7)
Neutrophils Relative %: 37 %
Platelet Count: 170 10*3/uL (ref 150–400)
RBC: 4.47 MIL/uL (ref 3.87–5.11)
RDW: 14.2 % (ref 11.5–15.5)
WBC Count: 3.2 10*3/uL — ABNORMAL LOW (ref 4.0–10.5)
nRBC: 0 % (ref 0.0–0.2)

## 2022-01-08 LAB — CMP (CANCER CENTER ONLY)
ALT: 15 U/L (ref 0–44)
AST: 19 U/L (ref 15–41)
Albumin: 4.1 g/dL (ref 3.5–5.0)
Alkaline Phosphatase: 84 U/L (ref 38–126)
Anion gap: 7 (ref 5–15)
BUN: 13 mg/dL (ref 8–23)
CO2: 28 mmol/L (ref 22–32)
Calcium: 9.6 mg/dL (ref 8.9–10.3)
Chloride: 107 mmol/L (ref 98–111)
Creatinine: 1.01 mg/dL — ABNORMAL HIGH (ref 0.44–1.00)
GFR, Estimated: >60 mL/min (ref >60)
Glucose, Bld: 101 mg/dL — ABNORMAL HIGH (ref 70–99)
Potassium: 4.3 mmol/L (ref 3.5–5.1)
Sodium: 142 mmol/L (ref 135–145)
Total Bilirubin: 0.5 mg/dL (ref 0.3–1.2)
Total Protein: 6.9 g/dL (ref 6.5–8.1)

## 2022-01-09 ENCOUNTER — Inpatient Hospital Stay (HOSPITAL_BASED_OUTPATIENT_CLINIC_OR_DEPARTMENT_OTHER): Payer: Medicare Other | Admitting: Hematology

## 2022-01-09 DIAGNOSIS — D693 Immune thrombocytopenic purpura: Secondary | ICD-10-CM

## 2022-01-09 NOTE — Progress Notes (Signed)
HEMATOLOGY/ONCOLOGY PHONE VISIT NOTE  Date of Service: 01/09/2022   Patient Care Team: Hoyt Koch, MD as PCP - General (Internal Medicine)  REFERRING PHYSICIAN: Hoyt Koch, *  CHIEF COMPLAINTS/PURPOSE OF CONSULTATION:  Follow-up for continued evaluation and management of ITP related to SLE   INTERVAL HISTORY  Patient was last connected with me on 08/26/21 via telephone visit  for follow up for her ITP and review recent labs. She noted that she discontinued Cellcept due to side effects such as severe headaches. She also reported that her Lupus had been moderate. Otherwise, she was doing well overall with no medical concerns.  I connected with Elaine Allen on 01/09/22 at  8:40 AM EST by telephone visit and verified that I am speaking with the correct person using two identifiers.   I discussed the limitations, risks, security and privacy concerns of performing an evaluation and management service by telemedicine and the availability of in-person appointments. I also discussed with the patient that there may be a patient responsible charge related to this service. The patient expressed understanding and agreed to proceed.   Other persons participating in the visit and their role in the encounter: none   Patient's location: home  Provider's location: [facility]   Chief Complaint: Follow-up for continued evaluation and management of ITP related to SLE    Today, she   She is UTD with vaccinations, including RSV, influenza, and COVID-19 booster.  She denies any  -Discussed lab results from 01/08/22 in detail with the patient. Labs shows WBC count of 3.2 K, hemoglobin of 13.5 K, and platelets of 170 K. CMP stable. -  MEDICAL HISTORY:  Past Medical History:  Diagnosis Date   Arthritis    Blind right eye    sees colors   Chronic anemia    Detached retina, right    corrected good for about 6 months sees peripheral colors only   History of kidney stones     Hypertension    Hyperthyroidism    Lupus ([place])    Pneumonia    Retinal detachment    Spasmodic dysphonia    Stroke ([place]) 2015   no permanant defecits had weakness in left arm in hospital Had PT also   Vertigo    Hx of preceding stroke     SURGICAL HISTORY: Past Surgical History:  Procedure Laterality Date   ABDOMINAL HYSTERECTOMY     BREAST EXCISIONAL BIOPSY Left    KNEE SURGERY Left    THYROID SURGERY     TOTAL HIP ARTHROPLASTY Left 06/27/2018   Procedure: TOTAL HIP ARTHROPLASTY ANTERIOR APPROACH;  Surgeon: Frederik Pear, MD;  Location: [facility];  Service: Orthopedics;  Laterality: Left;     SOCIAL HISTORY: Social History   Socioeconomic History   Marital status: Single    Spouse name: Not on file   Number of children: 2   Years of education: college   Highest education level: Not on file  Occupational History   Occupation: options  Tobacco Use   Smoking status: Never    Passive exposure: Never   Smokeless tobacco: Never  Vaping Use   Vaping Use: Never used  Substance and Sexual Activity   Alcohol use: No   Drug use: No   Sexual activity: Not Currently  Other Topics Concern   Not on file  Social History Narrative   Not on file   Social Determinants of Health   Financial Resource Strain: Low Risk  (08/13/2020)   Overall  Financial Resource Strain (CARDIA)    Difficulty of Paying Living Expenses: Not hard at all  Food Insecurity: No Food Insecurity (08/13/2020)   Hunger Vital Sign    Worried About Running Out of Food in the Last Year: Never true    Ran Out of Food in the Last Year: Never true  Transportation Needs: No Transportation Needs (08/13/2020)   PRAPARE - Administrator, Civil Service (Medical): No    Lack of Transportation (Non-Medical): No  Physical Activity: Sufficiently Active (08/13/2020)   Exercise Vital Sign    Days of Exercise per Week: 5 days    Minutes of Exercise per Session: 30 min  Stress: No Stress Concern Present (08/13/2020)    [employer] of Occupational Health - Occupational Stress Questionnaire    Feeling of Stress : Not at all  Social Connections: Moderately Integrated (08/13/2020)   Social Connection and Isolation Panel [NHANES]    Frequency of Communication with Friends and Family: More than three times a week    Frequency of Social Gatherings with Friends and Family: More than three times a week    Attends Religious Services: More than 4 times per year    Active Member of [employer] or Organizations: Yes    Attends Engineer, structural: More than 4 times per year    Marital Status: Divorced  Intimate Partner Violence: Not At Risk (08/13/2020)   Humiliation, Afraid, Rape, and Kick questionnaire    Fear of Current or Ex-Partner: No    Emotionally Abused: No    Physically Abused: No    Sexually Abused: No     FAMILY HISTORY: Family History  Problem Relation Age of Onset   Cancer Mother        liver   Diabetes Mother    Arthritis Mother    Cancer Father        prostate,METS, stomach    Ovarian cancer Sister    Throat cancer Brother      ALLERGIES:   is allergic to plaquenil [hydroxychloroquine sulfate], azithromycin, lactose intolerance (gi), and penicillin g.   MEDICATIONS:  Current Outpatient Medications  Medication Sig Dispense Refill   losartan-hydrochlorothiazide (HYZAAR) 100-25 MG tablet Take 1 tablet by mouth daily. 30 tablet 0   mycophenolate (CELLCEPT) 500 MG tablet Take 1 tablet (500 mg total) by mouth daily. 30 tablet 2   Probiotic Product (PROBIOTIC PO) Take 1 capsule by mouth daily.     vitamin B-12 (CYANOCOBALAMIN) 1000 MCG tablet Take 1,000 mcg by mouth daily.     No current facility-administered medications for this visit.    REVIEW OF SYSTEMS:    No abnormal bleeding or bruising   PHYSICAL EXAMINATION: Telemedicine visit  LABORATORY DATA:  I have reviewed the data as listed     Latest Ref Rng & Units 01/08/2022    8:24 AM 11/25/2021    8:32 AM 08/25/2021     8:36 AM  CBC  WBC 4.0 - 10.5 K/uL 3.2  3.0  3.0   Hemoglobin 12.0 - 15.0 g/dL 05.3  97.6  73.4   Hematocrit 36.0 - 46.0 % 40.7  41.6  36.5   Platelets 150 - 400 K/uL 170  106  149   ANC 1300     Latest Ref Rng & Units 01/08/2022    8:24 AM 11/25/2021    8:32 AM 08/25/2021    8:36 AM  CMP  Glucose 70 - 99 mg/dL 193  790  95   BUN 8 -  23 mg/dL 13  15  16    Creatinine 0.44 - 1.00 mg/dL  8.25  0.53   Sodium 135 - 145 mmol/L 142  138  141   Potassium 3.5 - 5.1 mmol/L 4.3  3.7  3.6   Chloride 98 - 111 mmol/L 107  102  108   CO2 22 - 32 mmol/L 28  29  28    Calcium 8.9 - 10.3 mg/dL 9.6  9.76  9.3   Total Protein 6.5 - 8.1 g/dL 6.9  7.9  6.6   Total Bilirubin 0.3 - 1.2 mg/dL 0.5  0.8  0.7   Alkaline Phos 38 - 126 U/L 84  105  80   AST 15 - 41 U/L 19  26  21    ALT 0 - 44 U/L 15  19  16     RADIOGRAPHIC STUDIES: I have personally reviewed the radiological images as listed and agreed with the findings in the report. CT ABDOMEN PELVIS WO CONTRAST  Result Date: 01/07/2022 CLINICAL DATA:  Intermittent abdominal cramping. EXAM: CT ABDOMEN AND PELVIS WITHOUT CONTRAST TECHNIQUE: Multidetector CT imaging of the abdomen and pelvis was performed following the standard protocol without IV contrast. RADIATION DOSE REDUCTION: This exam was performed according to the departmental dose-optimization program which includes automated exposure control, adjustment of the mA and/or kV according to patient size and/or use of iterative reconstruction technique. COMPARISON:  10/07/2020. FINDINGS: Lower chest: No acute abnormality. Hepatobiliary: No focal liver abnormality is seen. No gallstones, gallbladder wall thickening, or biliary dilatation. Pancreas: Unremarkable. No pancreatic ductal dilatation or surrounding inflammatory changes. Spleen: Normal in size without focal abnormality. Adrenals/Urinary Tract: The adrenal glands are within normal limits. Nonobstructive renal calculus on the right. Punctate renal  calculi are noted on the left. No hydroureteronephrosis bilaterally. The bladder is unremarkable. Stomach/Bowel: Stomach is within normal limits. Appendix appears normal. No evidence of bowel wall thickening, distention, or inflammatory changes. No free air or pneumatosis. Vascular/Lymphatic: No significant vascular findings are present. No enlarged abdominal or pelvic lymph nodes. Reproductive: Status post hysterectomy. No adnexal masses. Other: No abdominopelvic ascites. Musculoskeletal: Degenerative changes in the thoracolumbar spine. Total hip arthroplasty changes are present on the left. No acute osseous abnormality. IMPRESSION: 1. No abnormality to explain reported abdominal cramping. 2. Bilateral renal calculi. Electronically Signed   By: M.D.   On: 01/07/2022 04:55    ASSESSMENT & PLAN:   Elaine Allen is a 67 y.o. female with:  1.  H/o Relapsing ITP due to lupus -Likely related to Immune thrombocytopenia in the setting of lupus. Patient was treated for her relapsed ITP with 4 doses of Rituxan again. Plan -Labs done on 08/25/2021 showed no evidence of relapse of the patient's ITP at this time she had normal platelet counts normal hemoglobin and mild leukopenia/neutropenia likely related to her lupus. -No indication for additional treatment for the patient's ITP at this time. -She should continue to follow-up with rheumatology to optimize treatment for her SLE since this will have a bearing on her ITP relapse risk. -We discussed f/u with rheumatology for further evaluation and management. She has a referral for rheumatology at [facility] and will be seen by them.  2. Lupus - Was previously on Imuran.  Currently not on any medications Currently on prednisone.  Was prescribed CellCept in February 2023 but has not received the medication or started the medication yet. 3. H/o AVN -  related to steroids s/p left THA.  4. Lupus anticoagulant positive-history of  old stroke  but that was related to vertebral artery dissection. -Reasonable to consider baby aspirin 81 mg p.o. daily.  Will defer this to her primary care physician.  5.  Leukopenia/neutropenia -likely autoimmune related to lupus.   Plan:  -Discussed lab results from 01/08/22 in detail with the patient. Labs shows WBC count of 3.2 K, hemoglobin of 13.5 K, and platelets of 170 K. CMP stable. -  -Educated patient that the overall activity level of her SLE has a bearing on her ITP relapse. -patient notes that she has notfollowed with Dr Estanislado Pandy for >6 months for her SLE treatment and does not currently have a treatment plan for this. She also notes that she was to be referred to [facility] but has not received a referral yet. -she was recommended to f/u with Dr Estanislado Pandy for recommendation regarding her SLE. -we also discussed we could potentially consider Rituxan for management of her SLE and to keep her ITP from relapsing if Dr. Estanislado Pandy considers this to be reasonable treatment for her SLE as well. -Continue follow-up with Dr. Estanislado Pandy for continued rheumatologic cares.   FOLLOW UP: ***  The total time spent in the appointment was *** minutes* .  All of the patient's questions were answered with apparent satisfaction. The patient knows to call the clinic with any problems, questions or concerns.   [name] MD MS AAHIVMS [facility] Hematology/Oncology Physician [facility]  .*Total Encounter Time as defined by the Centers for Medicare and Medicaid Services includes, in addition to the face-to-face time of a patient visit (documented in the note above) non-face-to-face time: obtaining and reviewing outside history, ordering and reviewing medications, tests or procedures, care coordination (communications with other health care professionals or caregivers) and documentation in the medical record.   I,Mitra Faeizi,acting as a Education administrator for [name], MD.,have documented  all relevant documentation on the behalf of [name], MD,as directed by  [name], MD while in the presence of [name], MD.  ***

## 2022-03-18 ENCOUNTER — Inpatient Hospital Stay (HOSPITAL_COMMUNITY)
Admission: EM | Admit: 2022-03-18 | Discharge: 2022-03-20 | DRG: 813 | Disposition: A | Discharge status: Home / Self Care | Payer: Medicare Other | Attending: Internal Medicine | Admitting: Internal Medicine

## 2022-03-18 ENCOUNTER — Other Ambulatory Visit: Payer: Self-pay

## 2022-03-18 ENCOUNTER — Telehealth: Payer: Self-pay

## 2022-03-18 ENCOUNTER — Emergency Department (HOSPITAL_COMMUNITY): Payer: Medicare Other

## 2022-03-18 ENCOUNTER — Encounter (HOSPITAL_COMMUNITY): Payer: Self-pay

## 2022-03-18 DIAGNOSIS — Z8261 Family history of arthritis: Secondary | ICD-10-CM

## 2022-03-18 DIAGNOSIS — E876 Hypokalemia: Secondary | ICD-10-CM | POA: Diagnosis present

## 2022-03-18 DIAGNOSIS — E739 Lactose intolerance, unspecified: Secondary | ICD-10-CM | POA: Diagnosis present

## 2022-03-18 DIAGNOSIS — S8002XA Contusion of left knee, initial encounter: Secondary | ICD-10-CM | POA: Diagnosis present

## 2022-03-18 DIAGNOSIS — D6862 Lupus anticoagulant syndrome: Secondary | ICD-10-CM | POA: Diagnosis present

## 2022-03-18 DIAGNOSIS — Z79624 Long term (current) use of inhibitors of nucleotide synthesis: Secondary | ICD-10-CM

## 2022-03-18 DIAGNOSIS — R040 Epistaxis: Secondary | ICD-10-CM | POA: Diagnosis present

## 2022-03-18 DIAGNOSIS — Z9109 Other allergy status, other than to drugs and biological substances: Secondary | ICD-10-CM

## 2022-03-18 DIAGNOSIS — D693 Immune thrombocytopenic purpura: Principal | ICD-10-CM | POA: Diagnosis present

## 2022-03-18 DIAGNOSIS — E0590 Thyrotoxicosis, unspecified without thyrotoxic crisis or storm: Secondary | ICD-10-CM | POA: Diagnosis present

## 2022-03-18 DIAGNOSIS — D709 Neutropenia, unspecified: Secondary | ICD-10-CM | POA: Diagnosis present

## 2022-03-18 DIAGNOSIS — Z87442 Personal history of urinary calculi: Secondary | ICD-10-CM

## 2022-03-18 DIAGNOSIS — H5461 Unqualified visual loss, right eye, normal vision left eye: Secondary | ICD-10-CM | POA: Diagnosis present

## 2022-03-18 DIAGNOSIS — D72819 Decreased white blood cell count, unspecified: Secondary | ICD-10-CM

## 2022-03-18 DIAGNOSIS — Z881 Allergy status to other antibiotic agents status: Secondary | ICD-10-CM

## 2022-03-18 DIAGNOSIS — Z96642 Presence of left artificial hip joint: Secondary | ICD-10-CM | POA: Diagnosis present

## 2022-03-18 DIAGNOSIS — Z79899 Other long term (current) drug therapy: Secondary | ICD-10-CM

## 2022-03-18 DIAGNOSIS — Z833 Family history of diabetes mellitus: Secondary | ICD-10-CM

## 2022-03-18 DIAGNOSIS — Z66 Do not resuscitate: Secondary | ICD-10-CM | POA: Diagnosis present

## 2022-03-18 DIAGNOSIS — Z808 Family history of malignant neoplasm of other organs or systems: Secondary | ICD-10-CM

## 2022-03-18 DIAGNOSIS — Z8042 Family history of malignant neoplasm of prostate: Secondary | ICD-10-CM

## 2022-03-18 DIAGNOSIS — M1990 Unspecified osteoarthritis, unspecified site: Secondary | ICD-10-CM | POA: Diagnosis present

## 2022-03-18 DIAGNOSIS — D696 Thrombocytopenia, unspecified: Secondary | ICD-10-CM | POA: Diagnosis not present

## 2022-03-18 DIAGNOSIS — M329 Systemic lupus erythematosus, unspecified: Secondary | ICD-10-CM | POA: Diagnosis present

## 2022-03-18 DIAGNOSIS — Z6832 Body mass index (BMI) 32.0-32.9, adult: Secondary | ICD-10-CM

## 2022-03-18 DIAGNOSIS — E669 Obesity, unspecified: Secondary | ICD-10-CM | POA: Diagnosis present

## 2022-03-18 DIAGNOSIS — X58XXXA Exposure to other specified factors, initial encounter: Secondary | ICD-10-CM | POA: Diagnosis present

## 2022-03-18 DIAGNOSIS — Z8041 Family history of malignant neoplasm of ovary: Secondary | ICD-10-CM

## 2022-03-18 DIAGNOSIS — Z90710 Acquired absence of both cervix and uterus: Secondary | ICD-10-CM

## 2022-03-18 DIAGNOSIS — Z8673 Personal history of transient ischemic attack (TIA), and cerebral infarction without residual deficits: Secondary | ICD-10-CM

## 2022-03-18 DIAGNOSIS — Z880 Allergy status to penicillin: Secondary | ICD-10-CM

## 2022-03-18 DIAGNOSIS — Z800 Family history of malignant neoplasm of digestive organs: Secondary | ICD-10-CM

## 2022-03-18 DIAGNOSIS — I10 Essential (primary) hypertension: Secondary | ICD-10-CM | POA: Diagnosis present

## 2022-03-18 LAB — CBC WITH DIFFERENTIAL/PLATELET
Abs Immature Granulocytes: 0.02 10*3/uL (ref 0.00–0.07)
Basophils Absolute: 0 10*3/uL (ref 0.0–0.1)
Basophils Relative: 1 %
Eosinophils Absolute: 0.1 10*3/uL (ref 0.0–0.5)
Eosinophils Relative: 3 %
HCT: 36.9 % (ref 36.0–46.0)
Hemoglobin: 12.1 g/dL (ref 12.0–15.0)
Immature Granulocytes: 1 %
Lymphocytes Relative: 41 %
Lymphs Abs: 1.2 10*3/uL (ref 0.7–4.0)
MCH: 29.8 pg (ref 26.0–34.0)
MCHC: 32.8 g/dL (ref 30.0–36.0)
MCV: 90.9 fL (ref 80.0–100.0)
Monocytes Absolute: 0.3 10*3/uL (ref 0.1–1.0)
Monocytes Relative: 12 %
Neutro Abs: 1.3 10*3/uL — ABNORMAL LOW (ref 1.7–7.7)
Neutrophils Relative %: 42 %
Platelets: <5 10*3/uL — CL (ref 150–400)
RBC: 4.06 MIL/uL (ref 3.87–5.11)
RDW: 13.3 % (ref 11.5–15.5)
WBC: 2.9 10*3/uL — ABNORMAL LOW (ref 4.0–10.5)
nRBC: 0 % (ref 0.0–0.2)

## 2022-03-18 LAB — COMPREHENSIVE METABOLIC PANEL
ALT: 17 U/L (ref 0–44)
AST: 22 U/L (ref 15–41)
Albumin: 4.1 g/dL (ref 3.5–5.0)
Alkaline Phosphatase: 80 U/L (ref 38–126)
Anion gap: 8 (ref 5–15)
BUN: 15 mg/dL (ref 8–23)
CO2: 27 mmol/L (ref 22–32)
Calcium: 9.3 mg/dL (ref 8.9–10.3)
Chloride: 104 mmol/L (ref 98–111)
Creatinine, Ser: 0.89 mg/dL (ref 0.44–1.00)
GFR, Estimated: >60 mL/min (ref >60)
Glucose, Bld: 123 mg/dL — ABNORMAL HIGH (ref 70–99)
Potassium: 3.6 mmol/L (ref 3.5–5.1)
Sodium: 139 mmol/L (ref 135–145)
Total Bilirubin: 0.7 mg/dL (ref 0.3–1.2)
Total Protein: 7.2 g/dL (ref 6.5–8.1)

## 2022-03-18 LAB — PROTIME-INR
INR: 1 (ref 0.8–1.2)
Prothrombin Time: 13 seconds (ref 11.4–15.2)

## 2022-03-18 LAB — APTT: aPTT: 28 seconds (ref 24–36)

## 2022-03-18 LAB — IMMATURE PLATELET FRACTION: Immature Platelet Fraction: 0.6 % — ABNORMAL LOW (ref 1.2–8.6)

## 2022-03-18 MED ORDER — ONDANSETRON HCL 4 MG PO TABS: 4.0000 mg | ORAL_TABLET | Freq: Four times a day (QID) | ORAL | Status: DC | PRN

## 2022-03-18 MED ORDER — ACETAMINOPHEN 650 MG RE SUPP: 650.0000 mg | Freq: Four times a day (QID) | RECTAL | Status: DC | PRN

## 2022-03-18 MED ORDER — HYDROMORPHONE HCL 1 MG/ML IJ SOLN: 0.5000 mg | INTRAMUSCULAR | Status: DC | PRN

## 2022-03-18 MED ORDER — TRAZODONE HCL 50 MG PO TABS
25.0000 mg | ORAL_TABLET | Freq: Every evening | ORAL | Status: DC | PRN
  Administered 2022-03-19: 25 mg via ORAL
  Filled 2022-03-18: qty 1

## 2022-03-18 MED ORDER — HYDROCHLOROTHIAZIDE 25 MG PO TABS
25.0000 mg | ORAL_TABLET | Freq: Once | ORAL | Status: AC
  Administered 2022-03-18: 25 mg via ORAL
  Filled 2022-03-18: qty 1

## 2022-03-18 MED ORDER — LORATADINE 10 MG PO TABS
10.0000 mg | ORAL_TABLET | ORAL | Status: DC
  Administered 2022-03-19: 10 mg via ORAL
  Filled 2022-03-18: qty 1

## 2022-03-18 MED ORDER — LOSARTAN POTASSIUM 25 MG PO TABS
100.0000 mg | ORAL_TABLET | Freq: Once | ORAL | Status: AC
  Administered 2022-03-18: 100 mg via ORAL
  Filled 2022-03-18: qty 4

## 2022-03-18 MED ORDER — SODIUM CHLORIDE 0.9% IV SOLUTION: Freq: Once | INTRAVENOUS | Status: DC

## 2022-03-18 MED ORDER — OXYCODONE HCL 5 MG PO TABS
5.0000 mg | ORAL_TABLET | ORAL | Status: DC | PRN
  Administered 2022-03-19: 5 mg via ORAL
  Filled 2022-03-18: qty 1

## 2022-03-18 MED ORDER — METOCLOPRAMIDE HCL 10 MG PO TABS
10.0000 mg | ORAL_TABLET | Freq: Once | ORAL | Status: AC
  Administered 2022-03-18: 10 mg via ORAL
  Filled 2022-03-18: qty 1

## 2022-03-18 MED ORDER — SENNOSIDES-DOCUSATE SODIUM 8.6-50 MG PO TABS
1.0000 | ORAL_TABLET | Freq: Every evening | ORAL | Status: DC | PRN
  Administered 2022-03-19: 1 via ORAL
  Filled 2022-03-18: qty 1

## 2022-03-18 MED ORDER — ACETAMINOPHEN 325 MG PO TABS: 650.0000 mg | ORAL_TABLET | Freq: Four times a day (QID) | ORAL | Status: DC | PRN

## 2022-03-18 MED ORDER — ONDANSETRON HCL 4 MG/2ML IJ SOLN: 4.0000 mg | Freq: Four times a day (QID) | INTRAMUSCULAR | Status: DC | PRN

## 2022-03-18 MED ORDER — IMMUNE GLOBULIN (HUMAN) 10 GM/100ML IV SOLN
1.0000 g/kg | INTRAVENOUS | Status: AC
  Administered 2022-03-19: 5 g via INTRAVENOUS
  Administered 2022-03-19: 65 g via INTRAVENOUS
  Filled 2022-03-18 (×2): qty 650

## 2022-03-18 MED ORDER — DEXAMETHASONE 4 MG PO TABS: 40.0000 mg | ORAL_TABLET | Freq: Every day | ORAL | Status: DC

## 2022-03-18 MED ORDER — LOSARTAN POTASSIUM-HCTZ 100-25 MG PO TABS: 1.0000 | ORAL_TABLET | Freq: Every day | ORAL | Status: DC

## 2022-03-18 MED ORDER — ACETAMINOPHEN 325 MG PO TABS
650.0000 mg | ORAL_TABLET | ORAL | Status: AC
  Administered 2022-03-19 (×2): 650 mg via ORAL
  Filled 2022-03-18 (×2): qty 2

## 2022-03-18 NOTE — Telephone Encounter (Signed)
Returned call to pt. Pt states she had  lab work at PCP, states Plts are 3. Dr Irene Limbo notified. Directed pt to go to the ED. Pt verbalized understanding.

## 2022-03-18 NOTE — H&P (Signed)
History and Physical  Elaine Allen [id number] DOB: Jan 22, 1955 DOA: 03/18/2022   PCP: Cipriano Mile, NP   Patient coming from: Home  Chief Complaint: Bruising  HPI: Elaine Allen is a 67 y.o. female with medical history significant for SLE, ITP followed by Dr. Irene Limbo being admitted to the hospital with recurrent ITP.  Patient states that she noticed about 4 days ago that she was bruising very easily on her back, arms and legs.  She contacted her PCP 2 days ago, had lab work drawn and was contacted to state that her platelets are very low and she needs to come to the hospital.  She has noticed some petechia on her skin, but denies any spontaneous bleeding.  She also started having some headaches and mind fogginess the last few days.  ED Course: She presented to the emergency department, her vital signs are stable except for some hypertension.  Lab work was done which showed unremarkable CMP, WBC 2.9, platelets less than 5.  Due to headache, she had CT head without contrast.  Home medications were resumed, and she was seen by her oncologist whose recommendations are below.  Hospitalist was contacted for admission.  Review of Systems: Please see HPI for pertinent positives and negatives. A complete 10 system review of systems are otherwise negative.  Past Medical History:  Diagnosis Date   Arthritis    Blind right eye    sees colors   Chronic anemia    Detached retina, right    corrected good for about 6 months sees peripheral colors only   History of kidney stones    Hypertension    Hyperthyroidism    Lupus ([place])    Pneumonia    Retinal detachment    Spasmodic dysphonia    Stroke ([place]) 2015   no permanant defecits had weakness in left arm in hospital Had PT also   Vertigo    Hx of preceding stroke   Past Surgical History:  Procedure Laterality Date   ABDOMINAL HYSTERECTOMY     BREAST EXCISIONAL BIOPSY Left    KNEE SURGERY Left    THYROID SURGERY     TOTAL HIP  ARTHROPLASTY Left 06/27/2018   Procedure: TOTAL HIP ARTHROPLASTY ANTERIOR APPROACH;  Surgeon: Frederik Pear, MD;  Location: [facility];  Service: Orthopedics;  Laterality: Left;    Social History:  reports that she has never smoked. She has never been exposed to tobacco smoke. She has never used smokeless tobacco. She reports that she does not drink alcohol and does not use drugs.   Allergies  Allergen Reactions   Plaquenil [Hydroxychloroquine Sulfate] Anaphylaxis and Other (See Comments)    Blisters all over body   Amoxicillin Other (See Comments)   Azithromycin Nausea And Vomiting   Lactose Intolerance (Gi) Other (See Comments)    Upset stomach   Penicillin G Itching and Nausea Only    Did it involve swelling of the face/tongue/throat, SOB, or low BP? No No  Did it involve sudden or severe rash/hives, skin peeling, or any reaction on the inside of your mouth or nose? Np No  Did you need to seek medical attention at a hospital or doctor's office? Yes Yes  When did it last happen? around 2010  If all above answers are "NO", may proceed with cephalosporin use.   Hydroxychloroquine Sulfate Other (See Comments) and Rash    Family History  Problem Relation Age of Onset   Cancer Mother        liver  Diabetes Mother    Arthritis Mother    Cancer Father        prostate,METS, stomach    Ovarian cancer Sister    Throat cancer Brother      Prior to Admission medications   Medication Sig Start Date End Date Taking? Authorizing Provider  losartan-hydrochlorothiazide (HYZAAR) 100-25 MG tablet Take 1 tablet by mouth daily. 06/23/21   Hoyt Koch, MD  mycophenolate (CELLCEPT) 500 MG tablet Take 1 tablet (500 mg total) by mouth daily. 05/05/21   Ofilia Neas, PA-C  Probiotic Product (PROBIOTIC PO) Take 1 capsule by mouth daily.    [provider]  vitamin B-12 (CYANOCOBALAMIN) 1000 MCG tablet Take 1,000 mcg by mouth daily.    [provider]    Physical  Exam: BP (!) 147/89   Pulse 75   Temp 98.1 F (36.7 C) (Oral)   Resp 16   Ht '5\' 3"'$  (1.6 m)   Wt 82.6 kg   SpO2 100%   BMI 32.24 kg/m   General:  Alert, oriented, calm, in no acute distress.  She actually looks quite comfortable. Eyes: EOMI, clear conjuctivae, white sclerea Neck: supple, no masses, trachea mildline  Cardiovascular: RRR, no murmurs or rubs, no peripheral edema  Respiratory: clear to auscultation bilaterally, no wheezes, no crackles  Abdomen: soft, nontender, nondistended, normal bowel tones heard  Skin: dry, no rashes, she has widespread petechia especially in her back, her legs.  Bruising on her extremities. Musculoskeletal: no joint effusions, normal range of motion  Psychiatric: appropriate affect, normal speech  Neurologic: extraocular muscles intact, clear speech, moving all extremities with intact sensorium          Labs on Admission:  Basic Metabolic Panel: Recent Labs  Lab 03/18/22 1455  NA 139  K 3.6  CL 104  CO2 27  GLUCOSE 123*  BUN 15  CREATININE 0.89  CALCIUM 9.3   Liver Function Tests: Recent Labs  Lab 03/18/22 1455  AST 22  ALT 17  ALKPHOS 80  BILITOT 0.7  PROT 7.2  ALBUMIN 4.1   No results for input(s): "LIPASE", "AMYLASE" in the last 168 hours. No results for input(s): "AMMONIA" in the last 168 hours. CBC: Recent Labs  Lab 03/18/22 1455  WBC 2.9*  NEUTROABS 1.3*  HGB 12.1  HCT 36.9  MCV 90.9  PLT <5*   Cardiac Enzymes: No results for input(s): "CKTOTAL", "CKMB", "CKMBINDEX", "TROPONINI" in the last 168 hours.  BNP (last 3 results) No results for input(s): "BNP" in the last 8760 hours.  ProBNP (last 3 results) No results for input(s): "PROBNP" in the last 8760 hours.  CBG: No results for input(s): "GLUCAP" in the last 168 hours.  Radiological Exams on Admission: CT Head Wo Contrast  Result Date: 03/18/2022 CLINICAL DATA:  Headache with platelet count of 3. EXAM: CT HEAD WITHOUT CONTRAST TECHNIQUE: Contiguous  axial images were obtained from the base of the skull through the vertex without intravenous contrast. RADIATION DOSE REDUCTION: This exam was performed according to the departmental dose-optimization program which includes automated exposure control, adjustment of the mA and/or kV according to patient size and/or use of iterative reconstruction technique. COMPARISON:  Head CT 05/14/2013. FINDINGS: Brain: No acute intracranial hemorrhage. Encephalomalacia from old infarct in the right parietal lobe. Gray-white differentiation is otherwise preserved. No hydrocephalus or extra-axial collection. No mass effect or midline shift. Vascular: No hyperdense vessel. Skull: No calvarial fracture. Heterogeneous appearance of the diploic space of the skull with innumerable lucent areas.  Sinuses/Orbits: Unremarkable. Other: None. IMPRESSION: 1. No acute intracranial abnormality. 2. Old right parietal infarct. 3. Heterogeneous appearance of the diploic space of the skull with innumerable lucent areas. This may represent marrow expansion in the setting of known lupus and ITP. Electronically Signed   By: Emmit Alexanders M.D.   On: 03/18/2022 17:09    Assessment/Plan Principal Problem:   Immune thrombocytopenia (HCC) - this is due to her SLE.  No active bleeding.  She has been seen by her oncologist Dr. Irene Limbo this afternoon. -Observation admission -Emergency clinical transfusion now -Decadron 40 mg p.o. daily x 4 doses -IVIG 1 g/kg daily x 2 doses -Will follow daily labs -Oncology follow-up and recommendation greatly appreciated Active Problems:   Lupus (systemic lupus erythematosus) ([place])   Essential hypertension -Home medications continued   Leukopenia  DVT prophylaxis: None  Code Status: DNR, confirmed with patient this afternoon.  Admission status: Observation  Time spent: 45 minutes  Emeka Lindner Neva Seat MD [facility] Pager [phone]  If 7PM-7AM, please contact  night-coverage [email or web address] Password [facility]  03/18/2022, 5:50 PM

## 2022-03-18 NOTE — Progress Notes (Signed)
Hematology Short Note  Patient admitted with Acute on chronic ITP with PLT counts of <5k in the context of SLE. Plan - Platelet transfusion stat for PLT<10K -Dexamethasone '40mg'$  daily x 4 doses -IVIG 1g/kg daily x 2 doses with the dexamethasone + tylenol + Loratadine as premedications -Hematology will followup for full consultaton.  [name] MD MS

## 2022-03-18 NOTE — ED Provider Notes (Signed)
[place] EMERGENCY DEPARTMENT AT [facility] Provider Note   CSN: [id number] Arrival date & time: 03/18/22  1354     History  Chief Complaint  Patient presents with   abnormal lab    Shanetria Luma is a 67 y.o. female. With pmh SLE currently not on therapy, recurrent ITP treated with rituximab, thyroid disease, HTN who presents after recommendation from hematology after being found to have platelet level of 3.  On previous admissions, was treated with IVIG and Decadron.  Patient was getting routine labs drawn this past Monday when she was called today for platelet level 3.  Advised to go to the hospital for treatment.  She did notice she has been breaking out in petechial rash and had a spontaneous bruise on her left knee however denies any recent falls or injuries.  She had been complaining of some mild frontal headache earlier but denied any associated weakness, numbness or tingling, vomiting fevers.  It did improve with the Reglan provided in triage.  She is currently not on any therapies for lupus.  She says she has had too many poor side effects from the therapies for lupus.  Currently denying any bleeding.  HPI     Home Medications Prior to Admission medications   Medication Sig Start Date End Date Taking? Authorizing Provider  losartan-hydrochlorothiazide (HYZAAR) 100-25 MG tablet Take 1 tablet by mouth daily. 06/23/21   Hoyt Koch, MD  mycophenolate (CELLCEPT) 500 MG tablet Take 1 tablet (500 mg total) by mouth daily. 05/05/21   Ofilia Neas, PA-C  Probiotic Product (PROBIOTIC PO) Take 1 capsule by mouth daily.    [provider]  vitamin B-12 (CYANOCOBALAMIN) 1000 MCG tablet Take 1,000 mcg by mouth daily.    [provider]      Allergies    Plaquenil [hydroxychloroquine sulfate], Amoxicillin, Azithromycin, Lactose intolerance (gi), Penicillin g, and Hydroxychloroquine sulfate    Review of Systems   Review of Systems  Physical  Exam Updated Vital Signs BP (!) 147/89   Pulse 75   Temp 98.1 F (36.7 C) (Oral)   Resp 16   Ht '5\' 3"'$  (1.6 m)   Wt 82.6 kg   SpO2 100%   BMI 32.24 kg/m  Physical Exam Constitutional: Alert and oriented.  Pleasant woman no acute distress Eyes: Conjunctivae are normal. ENT      Head: Normocephalic and atraumatic. Cardiovascular: S1, S2,  Normal and symmetric distal pulses are present in all extremities.Warm and well perfused. Respiratory: Normal respiratory effort. Breath sounds are normal.  O2 sat 100 on RA Gastrointestinal: Soft and nontender.  Musculoskeletal: Normal range of motion in all extremities. Trace pitting edema of lower extremities nontender Neurologic: Normal speech and language.  No facial droop.  EOMI.  PERRL.  Equal strength bilateral upper and lower extremities.  Sensation grossly intact.  No gross focal neurologic deficits are appreciated. Skin: Skin is warm, dry.  Petechial rash of bilateral upper and lower extremities scattered about body.  Scattered ecchymoses of extremities Psychiatric: Mood and affect are normal. Speech and behavior are normal.  ED Results / Procedures / Treatments   Labs (all labs ordered are listed, but only abnormal results are displayed) Labs Reviewed  CBC WITH DIFFERENTIAL/PLATELET - Abnormal; Notable for the following components:      Result Value   WBC 2.9 (*)    Platelets <5 (*)    Neutro Abs 1.3 (*)    All other components within normal limits  COMPREHENSIVE  METABOLIC PANEL - Abnormal; Notable for the following components:   Glucose, Bld 123 (*)    All other components within normal limits  PROTIME-INR  APTT  IMMATURE PLATELET FRACTION    EKG EKG Interpretation  Date/Time:  Wednesday March 18 2022 14:29:50 EDT Ventricular Rate:  96 PR Interval:  130 QRS Duration: 78 QT Interval:  359 QTC Calculation: 454 R Axis:   64 Text Interpretation: Sinus rhythm Probable left atrial enlargement Borderline repolarization  abnormality No significant change since last tracing Confirmed by Georgina Snell [phone]) on 03/18/2022 3:36:30 PM  Radiology CT Head Wo Contrast  Result Date: 03/18/2022 CLINICAL DATA:  Headache with platelet count of 3. EXAM: CT HEAD WITHOUT CONTRAST TECHNIQUE: Contiguous axial images were obtained from the base of the skull through the vertex without intravenous contrast. RADIATION DOSE REDUCTION: This exam was performed according to the departmental dose-optimization program which includes automated exposure control, adjustment of the mA and/or kV according to patient size and/or use of iterative reconstruction technique. COMPARISON:  Head CT 05/14/2013. FINDINGS: Brain: No acute intracranial hemorrhage. Encephalomalacia from old infarct in the right parietal lobe. Gray-white differentiation is otherwise preserved. No hydrocephalus or extra-axial collection. No mass effect or midline shift. Vascular: No hyperdense vessel. Skull: No calvarial fracture. Heterogeneous appearance of the diploic space of the skull with innumerable lucent areas. Sinuses/Orbits: Unremarkable. Other: None. IMPRESSION: 1. No acute intracranial abnormality. 2. Old right parietal infarct. 3. Heterogeneous appearance of the diploic space of the skull with innumerable lucent areas. This may represent marrow expansion in the setting of known lupus and ITP. Electronically Signed   By: Emmit Alexanders M.D.   On: 03/18/2022 17:09    Procedures .Critical Care  Performed by: Elgie Congo, MD Authorized by: Elgie Congo, MD   Critical care provider statement:    Critical care time (minutes):  35   Critical care was necessary to treat or prevent imminent or life-threatening deterioration of the following conditions: hematologic crisis.   Critical care was time spent personally by me on the following activities:  Development of treatment plan with patient or surrogate, discussions with consultants, evaluation of  patient's response to treatment, examination of patient, ordering and review of laboratory studies, ordering and review of radiographic studies, ordering and performing treatments and interventions, pulse oximetry, re-evaluation of patient's condition, review of old charts and obtaining history from patient or surrogate   Care discussed with: admitting provider       Medications Ordered in ED Medications  losartan (COZAAR) tablet 100 mg (has no administration in time range)  hydrochlorothiazide (HYDRODIURIL) tablet 25 mg (has no administration in time range)  dexamethasone (DECADRON) tablet 40 mg (has no administration in time range)  Immune Globulin 10% (PRIVIGEN) IV infusion 65 g (has no administration in time range)  acetaminophen (TYLENOL) tablet 650 mg (has no administration in time range)  loratadine (CLARITIN) tablet 10 mg (has no administration in time range)  metoCLOPramide (REGLAN) tablet 10 mg (10 mg Oral Given 03/18/22 1434)    ED Course/ Medical Decision Making/ A&P Clinical Course as of 03/18/22 1724  Wed Mar 18, 2022  1627 S/w Dr Irene Limbo, hematologist who manages this patients, who says when SLE relapses, her ITP tends to relapse as well. Decadron 40 mg daily x 4 days, IVIG 1 mg/kg every 24 hours for 2 doses. [VB]    Clinical Course User Index [VB] Elgie Congo, MD   {  Medical Decision Making Season Goldtooth is a 67 y.o. female. With pmh SLE currently not on therapy, recurrent ITP treated with rituximab, thyroid disease, HTN who presents after recommendation from hematology after being found to have platelet level of 3.   Labs remarkable today for platelets less than 5.  Normal hemoglobin 12.1 and PT/PTT which makes me have lower suspicion for any other causes of acute thrombocytopenia such as TTP or DIC.  Suspect this is consistent with patient's recurrent episodes of ITP.  S/w Dr Irene Limbo, hematologist who manages this patients, who says  when SLE relapses, her ITP tends to relapse as well. Decadron 40 mg daily x 4 days, IVIG 1 mg/kg every 24 hours for 2 doses.  Discussed case with hospitalist will admit patient for continued management of ITP as listed above.   Amount and/or Complexity of Data Reviewed Labs: ordered. Radiology: ordered.  Risk Prescription drug management.    Final Clinical Impression(s) / ED Diagnoses Final diagnoses:  Thrombocytopenia [facility])    Rx / DC Orders ED Discharge Orders     None         Elgie Congo, MD 03/18/22 1724

## 2022-03-18 NOTE — ED Provider Triage Note (Addendum)
Emergency Medicine Provider Triage Evaluation Note  Elaine Allen , a 67 y.o. female  was evaluated in triage.  Pt complains of some fatigue and headaches as well. She states she's primarily here because she because she was told her PLT count was very low had to have a PLT infusion once before. Has had diffuse bruising and a nosebleed.  No head trauma.    No CP or SOB.  States she has systemic lupus.   Review of Systems  Positive: Bruising  Negative: Fever   Physical Exam  BP (!) 168/99 (BP Location: Left Arm)   Pulse (!) 111   Temp 98.1 F (36.7 C) (Oral)   Resp 14   Ht '5\' 3"'$  (1.6 m)   Wt 82.6 kg   SpO2 100%   BMI 32.24 kg/m  Gen:   Awake, no distress   Resp:  Normal effort  MSK:   Moves extremities without difficulty  Other:  Pleasant well appearing 67 year old female. Slightly irregular HR  Medical Decision Making  Medically screening exam initiated at 2:17 PM.  Appropriate orders placed.  Elaine Allen was informed that the remainder of the evaluation will be completed by another provider, this initial triage assessment does not replace that evaluation, and the importance of remaining in the ED until their evaluation is complete.  Labs, EKG   Tedd Sias, [place] 03/18/22 1419    Tedd Sias, PA 03/18/22 1420    Elaine Allen, [place] 03/18/22 1421

## 2022-03-18 NOTE — ED Triage Notes (Signed)
Referred by hematology due to platelet level of 3.  Pt reports bruising all over body and headache.  Hx lupus

## 2022-03-19 DIAGNOSIS — Z8041 Family history of malignant neoplasm of ovary: Secondary | ICD-10-CM | POA: Diagnosis not present

## 2022-03-19 DIAGNOSIS — D696 Thrombocytopenia, unspecified: Secondary | ICD-10-CM | POA: Diagnosis present

## 2022-03-19 DIAGNOSIS — D693 Immune thrombocytopenic purpura: Secondary | ICD-10-CM | POA: Diagnosis present

## 2022-03-19 DIAGNOSIS — Z79624 Long term (current) use of inhibitors of nucleotide synthesis: Secondary | ICD-10-CM | POA: Diagnosis not present

## 2022-03-19 DIAGNOSIS — S8002XA Contusion of left knee, initial encounter: Secondary | ICD-10-CM | POA: Diagnosis present

## 2022-03-19 DIAGNOSIS — Z66 Do not resuscitate: Secondary | ICD-10-CM | POA: Diagnosis present

## 2022-03-19 DIAGNOSIS — X58XXXA Exposure to other specified factors, initial encounter: Secondary | ICD-10-CM | POA: Diagnosis present

## 2022-03-19 DIAGNOSIS — Z96642 Presence of left artificial hip joint: Secondary | ICD-10-CM | POA: Diagnosis present

## 2022-03-19 DIAGNOSIS — D709 Neutropenia, unspecified: Secondary | ICD-10-CM | POA: Diagnosis present

## 2022-03-19 DIAGNOSIS — M329 Systemic lupus erythematosus, unspecified: Secondary | ICD-10-CM | POA: Diagnosis present

## 2022-03-19 DIAGNOSIS — H5461 Unqualified visual loss, right eye, normal vision left eye: Secondary | ICD-10-CM | POA: Diagnosis present

## 2022-03-19 DIAGNOSIS — E876 Hypokalemia: Secondary | ICD-10-CM | POA: Diagnosis present

## 2022-03-19 DIAGNOSIS — D6862 Lupus anticoagulant syndrome: Secondary | ICD-10-CM | POA: Diagnosis present

## 2022-03-19 DIAGNOSIS — E0590 Thyrotoxicosis, unspecified without thyrotoxic crisis or storm: Secondary | ICD-10-CM | POA: Diagnosis present

## 2022-03-19 DIAGNOSIS — Z87442 Personal history of urinary calculi: Secondary | ICD-10-CM | POA: Diagnosis not present

## 2022-03-19 DIAGNOSIS — Z8673 Personal history of transient ischemic attack (TIA), and cerebral infarction without residual deficits: Secondary | ICD-10-CM | POA: Diagnosis not present

## 2022-03-19 DIAGNOSIS — Z8261 Family history of arthritis: Secondary | ICD-10-CM | POA: Diagnosis not present

## 2022-03-19 DIAGNOSIS — R040 Epistaxis: Secondary | ICD-10-CM | POA: Diagnosis present

## 2022-03-19 DIAGNOSIS — Z833 Family history of diabetes mellitus: Secondary | ICD-10-CM | POA: Diagnosis not present

## 2022-03-19 DIAGNOSIS — E669 Obesity, unspecified: Secondary | ICD-10-CM | POA: Diagnosis present

## 2022-03-19 DIAGNOSIS — Z90710 Acquired absence of both cervix and uterus: Secondary | ICD-10-CM | POA: Diagnosis not present

## 2022-03-19 DIAGNOSIS — Z6832 Body mass index (BMI) 32.0-32.9, adult: Secondary | ICD-10-CM | POA: Diagnosis not present

## 2022-03-19 DIAGNOSIS — Z808 Family history of malignant neoplasm of other organs or systems: Secondary | ICD-10-CM | POA: Diagnosis not present

## 2022-03-19 DIAGNOSIS — Z79899 Other long term (current) drug therapy: Secondary | ICD-10-CM | POA: Diagnosis not present

## 2022-03-19 DIAGNOSIS — I10 Essential (primary) hypertension: Secondary | ICD-10-CM | POA: Diagnosis present

## 2022-03-19 DIAGNOSIS — M1990 Unspecified osteoarthritis, unspecified site: Secondary | ICD-10-CM | POA: Diagnosis present

## 2022-03-19 LAB — CBC
HCT: 34.9 % — ABNORMAL LOW (ref 36.0–46.0)
HCT: 37.6 % (ref 36.0–46.0)
HCT: 39.1 % (ref 36.0–46.0)
Hemoglobin: 11.5 g/dL — ABNORMAL LOW (ref 12.0–15.0)
Hemoglobin: 12.3 g/dL (ref 12.0–15.0)
Hemoglobin: 12.5 g/dL (ref 12.0–15.0)
MCH: 29.9 pg (ref 26.0–34.0)
MCH: 29.6 pg (ref 26.0–34.0)
MCH: 29.2 pg (ref 26.0–34.0)
MCHC: 33 g/dL (ref 30.0–36.0)
MCHC: 32.7 g/dL (ref 30.0–36.0)
MCHC: 32 g/dL (ref 30.0–36.0)
MCV: 90.6 fL (ref 80.0–100.0)
MCV: 90.6 fL (ref 80.0–100.0)
MCV: 91.4 fL (ref 80.0–100.0)
Platelets: <5 10*3/uL — CL (ref 150–400)
Platelets: 8 10*3/uL — CL (ref 150–400)
Platelets: 41 10*3/uL — ABNORMAL LOW (ref 150–400)
RBC: 3.85 MIL/uL — ABNORMAL LOW (ref 3.87–5.11)
RBC: 4.15 MIL/uL (ref 3.87–5.11)
RBC: 4.28 MIL/uL (ref 3.87–5.11)
RDW: 13.4 % (ref 11.5–15.5)
RDW: 13.4 % (ref 11.5–15.5)
RDW: 13.3 % (ref 11.5–15.5)
WBC: 3.2 10*3/uL — ABNORMAL LOW (ref 4.0–10.5)
WBC: 3 10*3/uL — ABNORMAL LOW (ref 4.0–10.5)
WBC: 5.8 10*3/uL (ref 4.0–10.5)
nRBC: 0 % (ref 0.0–0.2)
nRBC: 0 % (ref 0.0–0.2)
nRBC: 0 % (ref 0.0–0.2)

## 2022-03-19 LAB — BASIC METABOLIC PANEL
Anion gap: 9 (ref 5–15)
BUN: 12 mg/dL (ref 8–23)
CO2: 26 mmol/L (ref 22–32)
Calcium: 9.1 mg/dL (ref 8.9–10.3)
Chloride: 101 mmol/L (ref 98–111)
Creatinine, Ser: 0.7 mg/dL (ref 0.44–1.00)
GFR, Estimated: >60 mL/min (ref >60)
Glucose, Bld: 114 mg/dL — ABNORMAL HIGH (ref 70–99)
Potassium: 3.4 mmol/L — ABNORMAL LOW (ref 3.5–5.1)
Sodium: 136 mmol/L (ref 135–145)

## 2022-03-19 LAB — PREPARE PLATELET PHERESIS: Unit division: 0

## 2022-03-19 LAB — BPAM PLATELET PHERESIS
Blood Product Expiration Date: 202403142359
ISSUE DATE / TIME: 202403131853
Unit Type and Rh: 5100

## 2022-03-19 LAB — HIV ANTIBODY (ROUTINE TESTING W REFLEX): HIV Screen 4th Generation wRfx: NONREACTIVE

## 2022-03-19 MED ORDER — LABETALOL HCL 5 MG/ML IV SOLN
10.0000 mg | INTRAVENOUS | Status: DC | PRN
  Administered 2022-03-19: 10 mg via INTRAVENOUS
  Filled 2022-03-19: qty 4

## 2022-03-19 MED ORDER — LOSARTAN POTASSIUM 50 MG PO TABS
100.0000 mg | ORAL_TABLET | Freq: Every day | ORAL | Status: DC
  Administered 2022-03-19 – 2022-03-20 (×2): 100 mg via ORAL
  Filled 2022-03-19 (×2): qty 2

## 2022-03-19 MED ORDER — HYDROCHLOROTHIAZIDE 25 MG PO TABS
25.0000 mg | ORAL_TABLET | Freq: Every day | ORAL | Status: DC
  Administered 2022-03-19 – 2022-03-20 (×2): 25 mg via ORAL
  Filled 2022-03-19 (×2): qty 1

## 2022-03-19 MED ORDER — ALUM & MAG HYDROXIDE-SIMETH 200-200-20 MG/5ML PO SUSP
30.0000 mL | ORAL | Status: DC | PRN
  Administered 2022-03-19: 30 mL via ORAL
  Filled 2022-03-19: qty 30

## 2022-03-19 MED ORDER — LOSARTAN POTASSIUM-HCTZ 100-25 MG PO TABS: 1.0000 | ORAL_TABLET | Freq: Every day | ORAL | Status: DC

## 2022-03-19 MED ORDER — DEXAMETHASONE 4 MG PO TABS
40.0000 mg | ORAL_TABLET | Freq: Every day | ORAL | Status: DC
  Administered 2022-03-19 – 2022-03-20 (×2): 40 mg via ORAL
  Filled 2022-03-19 (×2): qty 10

## 2022-03-19 MED ORDER — SODIUM CHLORIDE 0.9% IV SOLUTION: Freq: Once | INTRAVENOUS | Status: DC

## 2022-03-19 NOTE — Progress Notes (Incomplete Revision)
HEMATOLOGY/ONCOLOGY INPATIENT PROGRESS NOTE  Date of Service: 03/20/2022  Inpatient Attending: .Shelly Coss, MD   SUBJECTIVE  Elaine Allen is a wonderful 67 y.o. female who has been referred to [place] by Dr Hollice Gong for evaluation and management of acute on chronic ITP.   Patient has a history of systemic lupus erythematosus and previously has been on treatment with methotrexate followed by CellCept but has been off on her treatments for the last 3 to 4 months.   She has had ITP previously with relapse that responded to steroids and IVIG and Rituxan.  She notes that her lupus has been active with joint pain and swelling and fatigue. She noted spontaneous bruising on her extremities and nosebleeds for the last 3 to 4 days #4 went to her primary doctor for further evaluation.  She had labs with her primary care physician today which showed platelets of less than 5k and was asked to call hematology clinic and was recommended to come to the emergency room.  Patient was last seen by me on 03/19/2022 and noted some mild nosebleeds.  Today, she reports that she has been eating well and has not had any issues with her steroids or IVIG. She notes an increase in energy level when taking steroids. She denies any bleeding/bruising issues, SOB, chest pain, abdominal pain, or issues with bowel movement.  OBJECTIVE:  NAD  PHYSICAL EXAMINATION: . Vitals:   03/19/22 0206 03/19/22 0221 03/19/22 0628 03/19/22 0835  BP: (!) 167/83 (!) 175/98 (!) 179/103 (!) 181/105  Pulse: 69 69 72 79  Resp: 15 15 18 18   Temp: 98.3 F (36.8 C) 98.2 F (36.8 C) 98.2 F (36.8 C) 97.9 F (36.6 C)  TempSrc: Oral Oral Oral Oral  SpO2: 100% 100% 99% 100%  Weight:      Height:       Filed Weights   03/18/22 1403  Weight: 82.6 kg   .Body mass index is 32.24 kg/m.  GENERAL:alert, in no acute distress and comfortable SKIN: skin color, texture, turgor are normal, no rashes or significant lesions EYES:  normal, conjunctiva are pink and non-injected, sclera clear OROPHARYNX:no exudate, no erythema and lips, buccal mucosa, and tongue normal  NECK: supple, no JVD, thyroid normal size, non-tender, without nodularity LYMPH:  no palpable lymphadenopathy in the cervical, axillary or inguinal LUNGS: clear to auscultation with normal respiratory effort HEART: regular rate & rhythm,  no murmurs and no lower extremity edema ABDOMEN: abdomen soft, non-tender, normoactive bowel sounds  Musculoskeletal: no cyanosis of digits and no clubbing  PSYCH: alert & oriented x 3 with fluent speech NEURO: no focal motor/sensory deficits  MEDICAL HISTORY:  Past Medical History:  Diagnosis Date   Arthritis    Blind right eye    sees colors   Chronic anemia    Detached retina, right    corrected good for about 6 months sees peripheral colors only   History of kidney stones    Hypertension    Hyperthyroidism    Lupus (HCC)    Pneumonia    Retinal detachment    Spasmodic dysphonia    Stroke ([place]) 2015   no permanant defecits had weakness in left arm in hospital Had PT also   Vertigo    Hx of preceding stroke    SURGICAL HISTORY: Past Surgical History:  Procedure Laterality Date   ABDOMINAL HYSTERECTOMY     BREAST EXCISIONAL BIOPSY Left    KNEE SURGERY Left    THYROID SURGERY  TOTAL HIP ARTHROPLASTY Left 06/27/2018   Procedure: TOTAL HIP ARTHROPLASTY ANTERIOR APPROACH;  Surgeon: Frederik Pear, MD;  Location: [facility];  Service: Orthopedics;  Laterality: Left;    SOCIAL HISTORY: Social History   Socioeconomic History   Marital status: Single    Spouse name: Not on file   Number of children: 2   Years of education: college   Highest education level: Not on file  Occupational History   Occupation: [employer]  Tobacco Use   Smoking status: Never    Passive exposure: Never   Smokeless tobacco: Never  Vaping Use   Vaping Use: Never used  Substance and Sexual Activity   Alcohol use: No   Drug  use: No   Sexual activity: Not Currently  Other Topics Concern   Not on file  Social History Narrative   Not on file   Social Determinants of Health   Financial Resource Strain: Low Risk  (08/13/2020)   Overall Financial Resource Strain (CARDIA)    Difficulty of Paying Living Expenses: Not hard at all  Food Insecurity: No Food Insecurity (03/19/2022)   Hunger Vital Sign    Worried About Running Out of Food in the Last Year: Never true    [place] in the Last Year: Never true  Transportation Needs: No Transportation Needs (03/19/2022)   PRAPARE - Hydrologist (Medical): No    Lack of Transportation (Non-Medical): No  Physical Activity: Sufficiently Active (08/13/2020)   Exercise Vital Sign    Days of Exercise per Week: 5 days    Minutes of Exercise per Session: 30 min  Stress: No Stress Concern Present (08/13/2020)   [place]    Feeling of Stress : Not at all  Social Connections: Moderately Integrated (08/13/2020)   Social Connection and Isolation Panel [NHANES]    Frequency of Communication with Friends and Family: More than three times a week    Frequency of Social Gatherings with Friends and Family: More than three times a week    Attends Religious Services: More than 4 times per year    Active Member of [employer] or Organizations: Yes    Attends Music therapist: More than 4 times per year    Marital Status: Divorced  Intimate Partner Violence: Not At Risk (03/19/2022)   Humiliation, Afraid, Rape, and Kick questionnaire    Fear of Current or Ex-Partner: No    Emotionally Abused: No    Physically Abused: No    Sexually Abused: No    FAMILY HISTORY: Family History  Problem Relation Age of Onset   Cancer Mother        liver   Diabetes Mother    Arthritis Mother    Cancer Father        prostate,METS, stomach    Ovarian cancer Sister    Throat cancer Brother      ALLERGIES:  is allergic to plaquenil [hydroxychloroquine sulfate], amoxicillin, azithromycin, lactose intolerance (gi), penicillin g, and hydroxychloroquine sulfate.  MEDICATIONS:  Scheduled Meds:  sodium chloride   Intravenous Once   acetaminophen  650 mg Oral Q24H   dexamethasone  40 mg Oral Daily   loratadine  10 mg Oral Q24H   losartan-hydrochlorothiazide  1 tablet Oral Daily   Continuous Infusions:  Immune Globulin 10% Stopped (03/19/22 0828)   PRN Meds:.acetaminophen **OR** acetaminophen, alum & mag hydroxide-simeth, HYDROmorphone (DILAUDID) injection, labetalol, ondansetron **OR** ondansetron (ZOFRAN) IV, oxyCODONE, senna-docusate, traZODone  REVIEW OF SYSTEMS:    10 Point review of Systems was done is negative except as noted above.   LABORATORY DATA:  I have reviewed the data as listed  .    Latest Ref Rng & Units 03/19/2022    2:28 PM 03/19/2022    8:40 AM 03/19/2022    3:40 AM  CBC  WBC 4.0 - 10.5 K/uL 5.8  3.0  3.2   Hemoglobin 12.0 - 15.0 g/dL 12.5  12.3  11.5   Hematocrit 36.0 - 46.0 % 39.1  37.6  34.9   Platelets 150 - 400 K/uL 41  8  <5     .    Latest Ref Rng & Units 03/19/2022    3:40 AM 03/18/2022    2:55 PM 01/08/2022    8:24 AM  CMP  Glucose 70 - 99 mg/dL 114  123  101   BUN 8 - 23 mg/dL 12  15  13    Creatinine 0.44 - 1.00 mg/dL 0.70  0.89  1.01   Sodium 135 - 145 mmol/L 136  139  142   Potassium 3.5 - 5.1 mmol/L 3.4  3.6  4.3   Chloride 98 - 111 mmol/L 101  104  107   CO2 22 - 32 mmol/L 26  27  28    Calcium 8.9 - 10.3 mg/dL 9.1  9.3  9.6   Total Protein 6.5 - 8.1 g/dL  7.2  6.9   Total Bilirubin 0.3 - 1.2 mg/dL  0.7  0.5   Alkaline Phos 38 - 126 U/L  80  84   AST 15 - 41 U/L  22  19   ALT 0 - 44 U/L  17  15      RADIOGRAPHIC STUDIES: I have personally reviewed the radiological images as listed and agreed with the findings in the report. CT Head Wo Contrast  Result Date: 03/18/2022 CLINICAL DATA:  Headache with platelet count of 3.  EXAM: CT HEAD WITHOUT CONTRAST TECHNIQUE: Contiguous axial images were obtained from the base of the skull through the vertex without intravenous contrast. RADIATION DOSE REDUCTION: This exam was performed according to the departmental dose-optimization program which includes automated exposure control, adjustment of the mA and/or kV according to patient size and/or use of iterative reconstruction technique. COMPARISON:  Head CT 05/14/2013. FINDINGS: Brain: No acute intracranial hemorrhage. Encephalomalacia from old infarct in the right parietal lobe. Gray-white differentiation is otherwise preserved. No hydrocephalus or extra-axial collection. No mass effect or midline shift. Vascular: No hyperdense vessel. Skull: No calvarial fracture. Heterogeneous appearance of the diploic space of the skull with innumerable lucent areas. Sinuses/Orbits: Unremarkable. Other: None. IMPRESSION: 1. No acute intracranial abnormality. 2. Old right parietal infarct. 3. Heterogeneous appearance of the diploic space of the skull with innumerable lucent areas. This may represent marrow expansion in the setting of known lupus and ITP. Electronically Signed   By: Emmit Alexanders M.D.   On: 03/18/2022 17:09    ASSESSMENT & PLAN:  Elaine Allen is a 67 y.o. female with:   1.  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 Patient was treated for her relapsed ITP with 4 doses of Rituxan again.   2. Lupus -being followed by Dr. Estanislado Pandy and now is transferring care to [facility]   3. H/o AVN -  related to steroids s/p left THA.  4. Lupus anticoagulant positive-history of old stroke but that was related to vertebral artery dissection. -Reasonable to consider baby aspirin 81 mg p.o. daily.  Will defer this to her primary care physician.   5.  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   Plan  -Discussed lab results from 03/19/22  with patient. CBC showed WBC of 3K, hemoglobin of 12.3, and platelets improved to 8K.and then to 41k -Transfuse platelets as needed to maintain platelet count of more than or equal to 10k -Check CBC 30 to 60 minutes after each platelet transfusion -Dexamethasone 40 mg p.o. daily for 4 days -IVIG 1 g/kg every 24 hours x 2 doses with Tylenol and loratadine as premedications. -proceed with second dose IVIG today -Will likely need to retreat with Rituxan weekly x 4 doses and then might need to consider maintenance Rituxan to address both the lupus and the relapsing ITP. -We shall continue to follow. Hopefully her platelet will remain >30k tomorrow and patient can be discharged with outpatient plan for hematology f/u and Rituxan  The total time spent in the appointment was 35 minutes* .  All of the patient's questions were answered with apparent satisfaction. The patient knows to call the clinic with any problems, questions or concerns.   [name] MD MS AAHIVMS [facility] Hematology/Oncology Physician [facility]  .*Total Encounter Time as defined by the Centers for Medicare and Medicaid Services includes, in addition to the face-to-face time of a patient visit (documented in the note above) non-face-to-face time: obtaining and reviewing outside history, ordering and reviewing medications, tests or procedures, care coordination (communications with other health care professionals or caregivers) and documentation in the medical record.   I,Mitra Faeizi,acting as a Education administrator for No name on file.,have documented all relevant documentation on the behalf of No name on file,as directed by  No name on file while in the presence of No name on file.  .I have reviewed the above documentation for accuracy and completeness, and I agree with the above. [name] MD MS

## 2022-03-19 NOTE — TOC CM/SW Note (Signed)
Transition of Care [facility]) Screening Note  Patient Details  Name: Elaine Allen Date of Birth: April 05, 1955  [facility] of [facility]) CM/SW Contact:    Sherie Don, LCSW Phone Number: 03/19/2022, 9:23 AM  Transition of Care Department [facility]) has reviewed patient and no TOC needs have been identified at this time. We will continue to monitor patient advancement through interdisciplinary progression rounds. If new patient transition needs arise, please place a TOC consult.

## 2022-03-19 NOTE — Progress Notes (Signed)
PROGRESS NOTE  Elaine Allen  [id number] DOB: [birth year] DOA: 03/18/2022 PCP: Cipriano Mile, NP   Brief Narrative: Patient is a 67 year old female with history of SLE,  recurrent ITP who presented with complaints of spontaneous bruising on her lower extremities, nosebleeds for 3 to 4 days and went to her PCP for further evaluation.  Blood work done as an outpatient showed platelets less than 5K and was sent to the emergency department.  Found to have severe thrombocytopenia here, hematology following.  Being transfused with a goal of more than 10,000 Platelets.  Also on IVIG and Decadron.  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  Found to have thrombocytopenia with platelets of less than 5K.  Likely immune mediated in the setting of lupus. Hematology following.  Previously treated with Rituxan Platelets level less than 5K today.  Plan for dexamethasone 40 mg x 4 doses, IVIG 1 g/kg x 2 doses.  Hematology planning for treatment with Rituxan weekly x 4 doses. No evidence of active bleeding. She was transfused with a unit of platelets ordered this morning, platelets improved just to 8000, will repeat another unit of transfusion of platelets.  Continue to transfuse until platelets is at least 10K. Any change in the mental status warrants stat CT head to rule out intracranial bleed.Will transfer to progressive unit.Minimize ambulation until platelets level improve  SLE: Follows with rheumatology, lupus anticoagulant positive.  History of old stroke.  Currently in remission.  Denies any joint pain  Leukopenia/neutropenia: Likely immune mediated secondary to lupus.  History of hypertension:remains hypertensive this mrng. Home medications restarted.  Continue prn   medications for severe hypertension  Hypokalemia: Supplement with potassium  Obesity: BMI of 32.2       DVT prophylaxis:SCD     Code Status: DNR  Family Communication: None at bedside  Patient status:Obs  Patient is from :Home  Anticipated discharge NE:[id number]  Estimated DC date: After improvement/stabilization in the platelet level, hematology clearance   Consultants: Hematology  Procedures: None  Antimicrobials:  Anti-infectives (From admission, onward)    None       Subjective: Patient seen and examined at bedside today.  Very comfortable.  Alert and oriented.  Denies any complaints.  Bruising on the left lower extremity have been improving.  Denies any headache.  Objective: Vitals:   03/19/22 0152 03/19/22 0206 03/19/22 0221 03/19/22 0628  BP: (!) 169/97 (!) 167/83 (!) 175/98 (!) 179/103  Pulse: 75 69 69 72  Resp: '15 15 15 18  '$ Temp: 98.2 F (36.8 C) 98.3 F (36.8 C) 98.2 F (36.8 C) 98.2 F (36.8 C)  TempSrc: Oral Oral Oral Oral  SpO2: 100% 100% 100% 99%  Weight:      Height:        Intake/Output Summary (Last 24 hours) at 03/19/2022 0747 Last data filed at 03/19/2022 0600 Gross per 24 hour  Intake 600 ml  Output 1 ml  Net 599 ml   Filed Weights   03/18/22 1403  Weight: 82.6 kg    Examination:  General exam: Overall comfortable, not in distress,obese HEENT: PERRL Respiratory system:  no wheezes or crackles  Cardiovascular system: S1 & S2 heard, RRR.  Gastrointestinal system: Abdomen is nondistended, soft and nontender. Central nervous system: Alert and oriented Extremities: No edema, no clubbing ,no cyanosis,  scattered areas of ecchymosis on bilateral lower legs Skin: No rashes, no ulcers,no icterus     Data Reviewed: I have personally reviewed following labs and imaging studies  CBC: Recent Labs  Lab 03/18/22 1455 03/19/22 0340  WBC 2.9* 3.2*  NEUTROABS 1.3*  --   HGB 12.1 11.5*  HCT 36.9 34.9*  MCV 90.9 90.6  PLT <5* <5*    Basic Metabolic Panel: Recent Labs  Lab 03/18/22 1455 03/19/22 0340  NA 139 136  K 3.6 3.4*  CL 104 101  CO2 27 26  GLUCOSE 123* 114*  BUN 15 12  CREATININE 0.89 0.70  CALCIUM 9.3 9.1     No results found for this or any previous visit (from the past 240 hour(s)).   Radiology Studies: CT Head Wo Contrast  Result Date: 03/18/2022 CLINICAL DATA:  Headache with platelet count of 3. EXAM: CT HEAD WITHOUT CONTRAST TECHNIQUE: Contiguous axial images were obtained from the base of the skull through the vertex without intravenous contrast. RADIATION DOSE REDUCTION: This exam was performed according to the departmental dose-optimization program which includes automated exposure control, adjustment of the mA and/or kV according to patient size and/or use of iterative reconstruction technique. COMPARISON:  Head CT 05/14/2013. FINDINGS: Brain: No acute intracranial hemorrhage. Encephalomalacia from old infarct in the right parietal lobe. Gray-white differentiation is otherwise preserved. No hydrocephalus or extra-axial collection. No mass effect or midline shift. Vascular: No hyperdense vessel. Skull: No calvarial fracture. Heterogeneous appearance of the diploic space of the skull with innumerable lucent areas. Sinuses/Orbits: Unremarkable. Other: None. IMPRESSION: 1. No acute intracranial abnormality. 2. Old right parietal infarct. 3. Heterogeneous appearance of the diploic space of the skull with innumerable lucent areas. This may represent marrow expansion in the setting of known lupus and ITP. Electronically Signed   By: Emmit Alexanders M.D.   On: 03/18/2022 17:09    Scheduled Meds:  sodium chloride   Intravenous Once   acetaminophen  650 mg Oral Q24H   dexamethasone  40 mg Oral Daily   loratadine  10 mg Oral Q24H   Continuous Infusions:  Immune Globulin 10%       LOS: 0 days   Shelly Coss, MD [facility] P3/14/2024, 7:47 AM

## 2022-03-19 NOTE — Progress Notes (Signed)
HEMATOLOGY/ONCOLOGY INPATIENT PROGRESS NOTE  Date of Service: 03/20/2022  Inpatient Attending: .Shelly Coss, MD   SUBJECTIVE  [name] was seen in hematology follow-up.  She tolerated her second dose of IVIG without any issues.  Her platelet counts have progressively improved and her at 48,000 today.  No other new issues with bleeding.  She is keen to be discharged home today.  OBJECTIVE:  NAD  PHYSICAL EXAMINATION: . Vitals:   03/19/22 0206 03/19/22 0221 03/19/22 0628 03/19/22 0835  BP: (!) 167/83 (!) 175/98 (!) 179/103 (!) 181/105  Pulse: 69 69 72 79  Resp: 15 15 18 18   Temp: 98.3 F (36.8 C) 98.2 F (36.8 C) 98.2 F (36.8 C) 97.9 F (36.6 C)  TempSrc: Oral Oral Oral Oral  SpO2: 100% 100% 99% 100%  Weight:      Height:       Filed Weights   03/18/22 1403  Weight: 82.6 kg   .Body mass index is 32.24 kg/m. Marland Kitchen GENERAL:alert, in no acute distress and comfortable NECK: supple, no JVD LYMPH:  no palpable lymphadenopathy in the cervical, axillary or inguinal regions LUNGS: clear to auscultation b/l with normal respiratory effort HEART: regular rate & rhythm ABDOMEN:  normoactive bowel sounds , non tender, not distended. Extremity: no pedal edema, scattered healing extremity bruising PSYCH: alert & oriented x 3 with fluent speech NEURO: no focal motor/sensory deficits   MEDICAL HISTORY:  Past Medical History:  Diagnosis Date   Arthritis    Blind right eye    sees colors   Chronic anemia    Detached retina, right    corrected good for about 6 months sees peripheral colors only   History of kidney stones    Hypertension    Hyperthyroidism    Lupus ([employer])    Pneumonia    Retinal detachment    Spasmodic dysphonia    Stroke ([place]) 2015   no permanant defecits had weakness in left arm in hospital Had PT also   Vertigo    Hx of preceding stroke    SURGICAL HISTORY: Past Surgical History:  Procedure Laterality Date   ABDOMINAL HYSTERECTOMY      BREAST EXCISIONAL BIOPSY Left    KNEE SURGERY Left    THYROID SURGERY     TOTAL HIP ARTHROPLASTY Left 06/27/2018   Procedure: TOTAL HIP ARTHROPLASTY ANTERIOR APPROACH;  Surgeon: Frederik Pear, MD;  Location: [facility];  Service: Orthopedics;  Laterality: Left;    SOCIAL HISTORY: Social History   Socioeconomic History   Marital status: Single    Spouse name: Not on file   Number of children: 2   Years of education: college   Highest education level: Not on file  Occupational History   Occupation: options  Tobacco Use   Smoking status: Never    Passive exposure: Never   Smokeless tobacco: Never  Vaping Use   Vaping Use: Never used  Substance and Sexual Activity   Alcohol use: No   Drug use: No   Sexual activity: Not Currently  Other Topics Concern   Not on file  Social History Narrative   Not on file   Social Determinants of Health   Financial Resource Strain: Low Risk  (08/13/2020)   Overall Financial Resource Strain (CARDIA)    Difficulty of Paying Living Expenses: Not hard at all  Food Insecurity: No Food Insecurity (03/19/2022)   Hunger Vital Sign    Worried About Running Out of Food in the Last Year: Never true  Ran Out of Food in the Last Year: Never true  Transportation Needs: No Transportation Needs (03/19/2022)   PRAPARE - Hydrologist (Medical): No    Lack of Transportation (Non-Medical): No  Physical Activity: Sufficiently Active (08/13/2020)   Exercise Vital Sign    Days of Exercise per Week: 5 days    Minutes of Exercise per Session: 30 min  Stress: No Stress Concern Present (08/13/2020)   [place]    Feeling of Stress : Not at all  Social Connections: Moderately Integrated (08/13/2020)   Social Connection and Isolation Panel [NHANES]    Frequency of Communication with Friends and Family: More than three times a week    Frequency of Social Gatherings with Friends  and Family: More than three times a week    Attends Religious Services: More than 4 times per year    Active Member of [employer] or Organizations: Yes    Attends Music therapist: More than 4 times per year    Marital Status: Divorced  Intimate Partner Violence: Not At Risk (03/19/2022)   Humiliation, Afraid, Rape, and Kick questionnaire    Fear of Current or Ex-Partner: No    Emotionally Abused: No    Physically Abused: No    Sexually Abused: No    FAMILY HISTORY: Family History  Problem Relation Age of Onset   Cancer Mother        liver   Diabetes Mother    Arthritis Mother    Cancer Father        prostate,METS, stomach    Ovarian cancer Sister    Throat cancer Brother     ALLERGIES:  is allergic to plaquenil [hydroxychloroquine sulfate], amoxicillin, azithromycin, lactose intolerance (gi), penicillin g, and hydroxychloroquine sulfate.  MEDICATIONS:  Scheduled Meds:  sodium chloride   Intravenous Once   acetaminophen  650 mg Oral Q24H   dexamethasone  40 mg Oral Daily   loratadine  10 mg Oral Q24H   losartan-hydrochlorothiazide  1 tablet Oral Daily   Continuous Infusions:  Immune Globulin 10% Stopped (03/19/22 0828)   PRN Meds:.acetaminophen **OR** acetaminophen, alum & mag hydroxide-simeth, HYDROmorphone (DILAUDID) injection, labetalol, ondansetron **OR** ondansetron (ZOFRAN) IV, oxyCODONE, senna-docusate, traZODone  REVIEW OF SYSTEMS:    10 Point review of Systems was done is negative except as noted above.   LABORATORY DATA:  I have reviewed the data as listed  .    Latest Ref Rng & Units 03/19/2022    2:28 PM 03/19/2022    8:40 AM 03/19/2022    3:40 AM  CBC  WBC 4.0 - 10.5 K/uL 5.8  3.0  3.2   Hemoglobin 12.0 - 15.0 g/dL 12.5  12.3  11.5   Hematocrit 36.0 - 46.0 % 39.1  37.6  34.9   Platelets 150 - 400 K/uL 41  8  <5     .    Latest Ref Rng & Units 03/19/2022    3:40 AM 03/18/2022    2:55 PM 01/08/2022    8:24 AM  CMP  Glucose 70 - 99 mg/dL  114  123  101   BUN 8 - 23 mg/dL 12  15  13    Creatinine 0.44 - 1.00 mg/dL 0.70  0.89  1.01   Sodium 135 - 145 mmol/L 136  139  142   Potassium 3.5 - 5.1 mmol/L 3.4  3.6  4.3   Chloride 98 - 111 mmol/L 101  104  107   CO2 22 - 32 mmol/L 26  27  28    Calcium 8.9 - 10.3 mg/dL 9.1  9.3  9.6   Total Protein 6.5 - 8.1 g/dL  7.2  6.9   Total Bilirubin 0.3 - 1.2 mg/dL  0.7  0.5   Alkaline Phos 38 - 126 U/L  80  84   AST 15 - 41 U/L  22  19   ALT 0 - 44 U/L  17  15      RADIOGRAPHIC STUDIES: I have personally reviewed the radiological images as listed and agreed with the findings in the report. CT Head Wo Contrast  Result Date: 03/18/2022 CLINICAL DATA:  Headache with platelet count of 3. EXAM: CT HEAD WITHOUT CONTRAST TECHNIQUE: Contiguous axial images were obtained from the base of the skull through the vertex without intravenous contrast. RADIATION DOSE REDUCTION: This exam was performed according to the departmental dose-optimization program which includes automated exposure control, adjustment of the mA and/or kV according to patient size and/or use of iterative reconstruction technique. COMPARISON:  Head CT 05/14/2013. FINDINGS: Brain: No acute intracranial hemorrhage. Encephalomalacia from old infarct in the right parietal lobe. Gray-white differentiation is otherwise preserved. No hydrocephalus or extra-axial collection. No mass effect or midline shift. Vascular: No hyperdense vessel. Skull: No calvarial fracture. Heterogeneous appearance of the diploic space of the skull with innumerable lucent areas. Sinuses/Orbits: Unremarkable. Other: None. IMPRESSION: 1. No acute intracranial abnormality. 2. Old right parietal infarct. 3. Heterogeneous appearance of the diploic space of the skull with innumerable lucent areas. This may represent marrow expansion in the setting of known lupus and ITP. Electronically Signed   By: Emmit Alexanders M.D.   On: 03/18/2022 17:09    ASSESSMENT & PLAN:  Elaine Allen is a 67 y.o. female with:   1.  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 Patient was treated for her relapsed ITP with 4 doses of Rituxan again.   2. Lupus -being followed by Dr. Estanislado Pandy and now is transferring care to [facility]   3. H/o AVN -  related to steroids s/p left THA.   4. Lupus anticoagulant positive-history of old stroke but that was related to vertebral artery dissection. -Reasonable to consider baby aspirin 81 mg p.o. daily.  Will defer this to her primary care physician.   5.  Leukopenia/neutropenia -likely autoimmune related to lupus.   Plan  -Discussed lab results from 03/20/22  with patient. CBC showed platelets improved to 48K. -Check CBC 30 to 60 minutes after each platelet transfusion -Dexamethasone 40 mg p.o. daily for 4 days -IVIG 1 g/kg every 24 hours x 2 doses with Tylenol and loratadine as premedications. -proceed with second dose IVIG today -Will likely need to retreat with Rituxan weekly x 4 doses and then might need to consider maintenance Rituxan to address both the lupus and the relapsing ITP. -We shall continue to follow. Hopefully her platelet will remain >30k tomorrow and patient can be discharged with outpatient plan for hematology f/u and Rituxan  The total time spent in the appointment was 25 minutes*.  All of the patient's questions were answered with apparent satisfaction. The patient knows to call the clinic with any problems, questions or concerns.   [name] MD MS AAHIVMS [facility] Hematology/Oncology Physician [facility]  .*Total Encounter Time as defined  by the Centers for Medicare and Medicaid Services includes, in addition to the face-to-face time of a patient visit (documented in the note above) non-face-to-face time: obtaining and reviewing outside history,  ordering and reviewing medications, tests or procedures, care coordination (communications with other health care professionals or caregivers) and documentation in the medical record.   I,Mitra Faeizi,acting as a Education administrator for No name on file.,have documented all relevant documentation on the behalf of No name on file,as directed by  No name on file while in the presence of No name on file.  .I have reviewed the above documentation for accuracy and completeness, and I agree with the above. [name] MD MS

## 2022-03-20 DIAGNOSIS — D693 Immune thrombocytopenic purpura: Secondary | ICD-10-CM | POA: Diagnosis not present

## 2022-03-20 LAB — PREPARE PLATELET PHERESIS: Unit division: 0

## 2022-03-20 LAB — BASIC METABOLIC PANEL
Anion gap: 9 (ref 5–15)
BUN: 25 mg/dL — ABNORMAL HIGH (ref 8–23)
CO2: 26 mmol/L (ref 22–32)
Calcium: 9 mg/dL (ref 8.9–10.3)
Chloride: 98 mmol/L (ref 98–111)
Creatinine, Ser: 0.95 mg/dL (ref 0.44–1.00)
GFR, Estimated: >60 mL/min (ref >60)
Glucose, Bld: 159 mg/dL — ABNORMAL HIGH (ref 70–99)
Potassium: 3.4 mmol/L — ABNORMAL LOW (ref 3.5–5.1)
Sodium: 133 mmol/L — ABNORMAL LOW (ref 135–145)

## 2022-03-20 LAB — CBC
HCT: 33.5 % — ABNORMAL LOW (ref 36.0–46.0)
Hemoglobin: 11.1 g/dL — ABNORMAL LOW (ref 12.0–15.0)
MCH: 30 pg (ref 26.0–34.0)
MCHC: 33.1 g/dL (ref 30.0–36.0)
MCV: 90.5 fL (ref 80.0–100.0)
Platelets: 48 10*3/uL — ABNORMAL LOW (ref 150–400)
RBC: 3.7 MIL/uL — ABNORMAL LOW (ref 3.87–5.11)
RDW: 13.2 % (ref 11.5–15.5)
WBC: 6.4 10*3/uL (ref 4.0–10.5)
nRBC: 0 % (ref 0.0–0.2)

## 2022-03-20 LAB — BPAM PLATELET PHERESIS
Blood Product Expiration Date: 202403162359
ISSUE DATE / TIME: 202403141202
Unit Type and Rh: 7300

## 2022-03-20 MED ORDER — ALUM & MAG HYDROXIDE-SIMETH 200-200-20 MG/5ML PO SUSP: 30.0000 mL | ORAL | 0 refills | Status: DC | PRN

## 2022-03-20 MED ORDER — POTASSIUM CHLORIDE CRYS ER 20 MEQ PO TBCR
40.0000 meq | EXTENDED_RELEASE_TABLET | Freq: Once | ORAL | Status: AC
  Administered 2022-03-20: 40 meq via ORAL
  Filled 2022-03-20: qty 2

## 2022-03-20 MED ORDER — LORATADINE 10 MG PO TABS: 10.0000 mg | ORAL_TABLET | ORAL | 0 refills | Status: DC

## 2022-03-20 MED ORDER — DEXAMETHASONE 20 MG PO TABS: 40.0000 mg | ORAL_TABLET | Freq: Every day | ORAL | 0 refills | Status: AC

## 2022-03-20 MED FILL — Immune Globulin (Human) IV Soln 20 GM/200ML: INTRAVENOUS | Qty: 600 | Status: AC

## 2022-03-21 ENCOUNTER — Other Ambulatory Visit: Payer: Self-pay | Admitting: Hematology

## 2022-03-21 ENCOUNTER — Encounter: Payer: Self-pay | Admitting: Hematology

## 2022-03-21 DIAGNOSIS — D693 Immune thrombocytopenic purpura: Secondary | ICD-10-CM

## 2022-03-21 LAB — TYPE AND SCREEN
ABO/RH(D): O POS
Antibody Screen: NEGATIVE

## 2022-03-24 ENCOUNTER — Telehealth: Payer: Self-pay | Admitting: Hematology

## 2022-03-24 NOTE — Telephone Encounter (Signed)
Called patient per treatment plan to confirm start date. Patient scheduled and notified.

## 2022-03-26 ENCOUNTER — Other Ambulatory Visit: Payer: Self-pay

## 2022-03-26 DIAGNOSIS — D693 Immune thrombocytopenic purpura: Secondary | ICD-10-CM

## 2022-03-27 ENCOUNTER — Inpatient Hospital Stay: Payer: Medicare Other

## 2022-03-27 ENCOUNTER — Inpatient Hospital Stay (HOSPITAL_BASED_OUTPATIENT_CLINIC_OR_DEPARTMENT_OTHER): Payer: Medicare Other | Admitting: Hematology

## 2022-03-27 ENCOUNTER — Inpatient Hospital Stay: Payer: Medicare Other | Attending: Hematology

## 2022-03-27 VITALS — BP 129/82 | HR 74 | Temp 98.0°F | Resp 18 | Wt 184.8 lb

## 2022-03-27 DIAGNOSIS — Z5112 Encounter for antineoplastic immunotherapy: Secondary | ICD-10-CM | POA: Insufficient documentation

## 2022-03-27 DIAGNOSIS — D693 Immune thrombocytopenic purpura: Secondary | ICD-10-CM | POA: Diagnosis not present

## 2022-03-27 LAB — CBC WITH DIFFERENTIAL (CANCER CENTER ONLY)
Abs Immature Granulocytes: 0.06 10*3/uL (ref 0.00–0.07)
Basophils Absolute: 0 10*3/uL (ref 0.0–0.1)
Basophils Relative: 0 %
Eosinophils Absolute: 0 10*3/uL (ref 0.0–0.5)
Eosinophils Relative: 0 %
HCT: 36.5 % (ref 36.0–46.0)
Hemoglobin: 12.6 g/dL (ref 12.0–15.0)
Immature Granulocytes: 1 %
Lymphocytes Relative: 19 %
Lymphs Abs: 1.1 10*3/uL (ref 0.7–4.0)
MCH: 30.1 pg (ref 26.0–34.0)
MCHC: 34.5 g/dL (ref 30.0–36.0)
MCV: 87.3 fL (ref 80.0–100.0)
Monocytes Absolute: 0.6 10*3/uL (ref 0.1–1.0)
Monocytes Relative: 10 %
Neutro Abs: 4.3 10*3/uL (ref 1.7–7.7)
Neutrophils Relative %: 70 %
Platelet Count: 181 10*3/uL (ref 150–400)
RBC: 4.18 MIL/uL (ref 3.87–5.11)
RDW: 13.1 % (ref 11.5–15.5)
WBC Count: 6.2 10*3/uL (ref 4.0–10.5)
nRBC: 0 % (ref 0.0–0.2)

## 2022-03-27 LAB — CMP (CANCER CENTER ONLY)
ALT: 12 U/L (ref 0–44)
AST: 14 U/L — ABNORMAL LOW (ref 15–41)
Albumin: 4 g/dL (ref 3.5–5.0)
Alkaline Phosphatase: 72 U/L (ref 38–126)
Anion gap: 8 (ref 5–15)
BUN: 22 mg/dL (ref 8–23)
CO2: 21 mmol/L — ABNORMAL LOW (ref 22–32)
Calcium: 9.8 mg/dL (ref 8.9–10.3)
Chloride: 105 mmol/L (ref 98–111)
Creatinine: 0.84 mg/dL (ref 0.44–1.00)
GFR, Estimated: >60 mL/min (ref >60)
Glucose, Bld: 161 mg/dL — ABNORMAL HIGH (ref 70–99)
Potassium: 4 mmol/L (ref 3.5–5.1)
Sodium: 134 mmol/L — ABNORMAL LOW (ref 135–145)
Total Bilirubin: 0.7 mg/dL (ref 0.3–1.2)
Total Protein: 8.6 g/dL — ABNORMAL HIGH (ref 6.5–8.1)

## 2022-03-27 LAB — RETIC PANEL
Immature Retic Fract: 10 % (ref 2.3–15.9)
RBC.: 4.2 MIL/uL (ref 3.87–5.11)
Retic Count, Absolute: 94.5 10*3/uL (ref 19.0–186.0)
Retic Ct Pct: 2.3 % (ref 0.4–3.1)
Reticulocyte Hemoglobin: 33.1 pg (ref >27.9)

## 2022-03-27 MED ORDER — DIPHENHYDRAMINE HCL 25 MG PO CAPS
50.0000 mg | ORAL_CAPSULE | Freq: Once | ORAL | Status: AC
  Administered 2022-03-27: 50 mg via ORAL
  Filled 2022-03-27: qty 2

## 2022-03-27 MED ORDER — FAMOTIDINE IN NACL 20-0.9 MG/50ML-% IV SOLN
20.0000 mg | Freq: Once | INTRAVENOUS | Status: AC
  Administered 2022-03-27: 20 mg via INTRAVENOUS
  Filled 2022-03-27: qty 50

## 2022-03-27 MED ORDER — METHYLPREDNISOLONE SODIUM SUCC 125 MG IJ SOLR
125.0000 mg | Freq: Every day | INTRAMUSCULAR | Status: DC
  Administered 2022-03-27: 125 mg via INTRAVENOUS
  Filled 2022-03-27: qty 2

## 2022-03-27 MED ORDER — ACETAMINOPHEN 325 MG PO TABS
650.0000 mg | ORAL_TABLET | Freq: Once | ORAL | Status: AC
  Administered 2022-03-27: 650 mg via ORAL
  Filled 2022-03-27: qty 2

## 2022-03-27 MED ORDER — SODIUM CHLORIDE 0.9 % IV SOLN
375.0000 mg/m2 | Freq: Once | INTRAVENOUS | Status: AC
  Administered 2022-03-27: 700 mg via INTRAVENOUS
  Filled 2022-03-27: qty 20

## 2022-03-27 MED ORDER — SODIUM CHLORIDE 0.9 % IV SOLN: Freq: Once | INTRAVENOUS | Status: AC

## 2022-03-27 NOTE — Patient Instructions (Signed)
[facility]  Discharge Instructions: Thank you for choosing [facility] to provide your oncology and hematology care.   If you have a lab appointment with the Cancer Center, please go directly to the Cancer Center and check in at the registration area.   Wear comfortable clothing and clothing appropriate for easy access to any Portacath or PICC line.   We strive to give you quality time with your provider. You may need to reschedule your appointment if you arrive late (15 or more minutes).  Arriving late affects you and other patients whose appointments are after yours.  Also, if you miss three or more appointments without notifying the office, you may be dismissed from the clinic at the provider's discretion.      For prescription refill requests, have your pharmacy contact our office and allow 72 hours for refills to be completed.    Today you received the following chemotherapy and/or immunotherapy agents: Rituximab.       To help prevent nausea and vomiting after your treatment, we encourage you to take your nausea medication as directed.  BELOW ARE SYMPTOMS THAT SHOULD BE REPORTED IMMEDIATELY: *FEVER GREATER THAN 100.4 F (38 C) OR HIGHER *CHILLS OR SWEATING *NAUSEA AND VOMITING THAT IS NOT CONTROLLED WITH YOUR NAUSEA MEDICATION *UNUSUAL SHORTNESS OF BREATH *UNUSUAL BRUISING OR BLEEDING *URINARY PROBLEMS (pain or burning when urinating, or frequent urination) *BOWEL PROBLEMS (unusual diarrhea, constipation, pain near the anus) TENDERNESS IN MOUTH AND THROAT WITH OR WITHOUT PRESENCE OF ULCERS (sore throat, sores in mouth, or a toothache) UNUSUAL RASH, SWELLING OR PAIN  UNUSUAL VAGINAL DISCHARGE OR ITCHING   Items with * indicate a potential emergency and should be followed up as soon as possible or go to the Emergency Department if any problems should occur.  Please show the CHEMOTHERAPY ALERT CARD or IMMUNOTHERAPY ALERT CARD at  check-in to the Emergency Department and triage nurse.  Should you have questions after your visit or need to cancel or reschedule your appointment, please contact [facility]  Dept: [phone]  and follow the prompts.  Office hours are 8:00 a.m. to 4:30 p.m. Monday - Friday. Please note that voicemails left after 4:00 p.m. may not be returned until the following business day.  We are closed weekends and major holidays. You have access to a nurse at all times for urgent questions. Please call the main number to the clinic Dept: [phone] and follow the prompts.   For any non-urgent questions, you may also contact your provider using MyChart. We now offer e-Visits for anyone 18 and older to request care online for non-urgent symptoms. For details visit [email or web address].   Also download the MyChart app! Go to the app store, search "MyChart", open the app, select [facility], and log in with your MyChart username and password.   

## 2022-03-27 NOTE — Progress Notes (Signed)
HEMATOLOGY/ONCOLOGY CLINIC VISIT NOTE  Date of Service: 03/27/2022   Patient Care Team: Cipriano Mile, NP as PCP - General  REFERRING PHYSICIAN: Cipriano Mile, NP  CHIEF COMPLAINTS/PURPOSE OF CONSULTATION:  Follow-up for continued evaluation and management of ITP related to SLE   INTERVAL HISTORY  Elaine Allen is a 67 y.o. female here for continued evaluation and management of ITP related to SLE. Patient was last connected with me via telemedicine on 01/09/2022 and was doing well overall with no new medical concerns.  Today, she reports that she been feeling well overall and her joint pain related to rheumatoid arthritis has not been too bothersome. No new bleeding issues. Labs done today discussed. Tolerating Rituxan without any acute isseus MEDICAL HISTORY:  Past Medical History:  Diagnosis Date   Arthritis    Blind right eye    sees colors   Chronic anemia    Detached retina, right    corrected good for about 6 months sees peripheral colors only   History of kidney stones    Hypertension    Hyperthyroidism    Lupus ([place])    Pneumonia    Retinal detachment    Spasmodic dysphonia    Stroke ([place]) 2015   no permanant defecits had weakness in left arm in hospital Had PT also   Vertigo    Hx of preceding stroke     SURGICAL HISTORY: Past Surgical History:  Procedure Laterality Date   ABDOMINAL HYSTERECTOMY     BREAST EXCISIONAL BIOPSY Left    KNEE SURGERY Left    THYROID SURGERY     TOTAL HIP ARTHROPLASTY Left 06/27/2018   Procedure: TOTAL HIP ARTHROPLASTY ANTERIOR APPROACH;  Surgeon: Frederik Pear, MD;  Location: [facility];  Service: Orthopedics;  Laterality: Left;     SOCIAL HISTORY: Social History   Socioeconomic History   Marital status: Single    Spouse name: Not on file   Number of children: 2   Years of education: college   Highest education level: Not on file  Occupational History   Occupation: options  Tobacco Use   Smoking status: Never     Passive exposure: Never   Smokeless tobacco: Never  Vaping Use   Vaping Use: Never used  Substance and Sexual Activity   Alcohol use: No   Drug use: No   Sexual activity: Not Currently  Other Topics Concern   Not on file  Social History Narrative   Not on file   Social Determinants of Health   Financial Resource Strain: Low Risk  (08/13/2020)   Overall Financial Resource Strain (CARDIA)    Difficulty of Paying Living Expenses: Not hard at all  Food Insecurity: No Food Insecurity (03/19/2022)   Hunger Vital Sign    Worried About Running Out of Food in the Last Year: Never true    [place] in the Last Year: Never true  Transportation Needs: No Transportation Needs (03/19/2022)   PRAPARE - Hydrologist (Medical): No    Lack of Transportation (Non-Medical): No  Physical Activity: Sufficiently Active (08/13/2020)   Exercise Vital Sign    Days of Exercise per Week: 5 days    Minutes of Exercise per Session: 30 min  Stress: No Stress Concern Present (08/13/2020)   [place]    Feeling of Stress : Not at all  Social Connections: Moderately Integrated (08/13/2020)   Social Connection and Isolation Panel [NHANES]  Frequency of Communication with Friends and Family: More than three times a week    Frequency of Social Gatherings with Friends and Family: More than three times a week    Attends Religious Services: More than 4 times per year    Active Member of [employer] or Organizations: Yes    Attends Music therapist: More than 4 times per year    Marital Status: Divorced  Intimate Partner Violence: Not At Risk (03/19/2022)   Humiliation, Afraid, Rape, and Kick questionnaire    Fear of Current or Ex-Partner: No    Emotionally Abused: No    Physically Abused: No    Sexually Abused: No     FAMILY HISTORY: Family History  Problem Relation Age of Onset   Cancer Mother         liver   Diabetes Mother    Arthritis Mother    Cancer Father        prostate,METS, stomach    Ovarian cancer Sister    Throat cancer Brother      ALLERGIES:   is allergic to plaquenil [hydroxychloroquine sulfate], amoxicillin, azithromycin, lactose intolerance (gi), penicillin g, and hydroxychloroquine sulfate.   MEDICATIONS:  Current Outpatient Medications  Medication Sig Dispense Refill   alum & mag hydroxide-simeth (MAALOX/MYLANTA) 200-200-20 MG/5ML suspension Take 30 mLs by mouth every 4 (four) hours as needed for indigestion or heartburn. 355 mL 0   amLODipine (NORVASC) 10 MG tablet Take 10 mg by mouth daily.     cholecalciferol (VITAMIN D3) 25 MCG (1000 UNIT) tablet Take 1,000 Units by mouth daily.     loratadine (CLARITIN) 10 MG tablet Take 1 tablet (10 mg total) by mouth daily. 30 tablet 0   losartan-hydrochlorothiazide (HYZAAR) 100-25 MG tablet Take 1 tablet by mouth daily. 30 tablet 0   Multiple Vitamin (MULTIVITAMIN WITH MINERALS) TABS tablet Take 1 tablet by mouth daily.     Probiotic Product (PROBIOTIC PO) Take 1 capsule by mouth daily.     vitamin B-12 (CYANOCOBALAMIN) 1000 MCG tablet Take 1,000 mcg by mouth daily.     No current facility-administered medications for this visit.    REVIEW OF SYSTEMS:    10 Point review of Systems was done is negative except as noted above.   PHYSICAL EXAMINATION: VS stable GENERAL:alert, in no acute distress and comfortable SKIN: no acute rashes, no significant lesions EYES: conjunctiva are pink and non-injected, sclera anicteric OROPHARYNX: MMM, no exudates, no oropharyngeal erythema or ulceration NECK: supple, no JVD LYMPH:  no palpable lymphadenopathy in the cervical, axillary or inguinal regions LUNGS: clear to auscultation b/l with normal respiratory effort HEART: regular rate & rhythm ABDOMEN:  normoactive bowel sounds , non tender, not distended. Extremity: no pedal edema PSYCH: alert & oriented x 3 with fluent  speech NEURO: no focal motor/sensory deficits   LABORATORY DATA:  I have reviewed the data as listed     Latest Ref Rng & Units 03/27/2022    9:36 AM 03/20/2022    4:40 AM 03/19/2022    2:28 PM  CBC  WBC 4.0 - 10.5 K/uL 6.2  6.4  5.8   Hemoglobin 12.0 - 15.0 g/dL 12.6  11.1  12.5   Hematocrit 36.0 - 46.0 % 36.5  33.5  39.1   Platelets 150 - 400 K/uL 181  48  41   ANC 1300     Latest Ref Rng & Units 03/27/2022    9:36 AM 03/20/2022    4:40 AM 03/19/2022  3:40 AM  CMP  Glucose 70 - 99 mg/dL 161  159  114   BUN 8 - 23 mg/dL 22  25  12    Creatinine 0.44 - 1.00 mg/dL 0.84  0.95  0.70   Sodium 135 - 145 mmol/L 134  133  136   Potassium 3.5 - 5.1 mmol/L 4.0  3.4  3.4   Chloride 98 - 111 mmol/L 105  98  101   CO2 22 - 32 mmol/L 21  26  26    Calcium 8.9 - 10.3 mg/dL 9.8  9.0  9.1   Total Protein 6.5 - 8.1 g/dL 8.6     Total Bilirubin 0.3 - 1.2 mg/dL 0.7     Alkaline Phos 38 - 126 U/L 72     AST 15 - 41 U/L 14     ALT 0 - 44 U/L 12      RADIOGRAPHIC STUDIES: I have personally reviewed the radiological images as listed and agreed with the findings in the report. CT Head Wo Contrast  Result Date: 03/18/2022 CLINICAL DATA:  Headache with platelet count of 3. EXAM: CT HEAD WITHOUT CONTRAST TECHNIQUE: Contiguous axial images were obtained from the base of the skull through the vertex without intravenous contrast. RADIATION DOSE REDUCTION: This exam was performed according to the departmental dose-optimization program which includes automated exposure control, adjustment of the mA and/or kV according to patient size and/or use of iterative reconstruction technique. COMPARISON:  Head CT 05/14/2013. FINDINGS: Brain: No acute intracranial hemorrhage. Encephalomalacia from old infarct in the right parietal lobe. Gray-white differentiation is otherwise preserved. No hydrocephalus or extra-axial collection. No mass effect or midline shift. Vascular: No hyperdense vessel. Skull: No calvarial fracture.  Heterogeneous appearance of the diploic space of the skull with innumerable lucent areas. Sinuses/Orbits: Unremarkable. Other: None. IMPRESSION: 1. No acute intracranial abnormality. 2. Old right parietal infarct. 3. Heterogeneous appearance of the diploic space of the skull with innumerable lucent areas. This may represent marrow expansion in the setting of known lupus and ITP. Electronically Signed   By: Emmit Alexanders M.D.   On: 03/18/2022 17:09    ASSESSMENT & PLAN:   Riyann Rustin is a 67 y.o. female with:  1.  H/o Relapsing ITP due to lupus -Likely related to Immune thrombocytopenia in the setting of lupus. Patient was treated for her relapsed ITP with 4 doses of Rituxan again.  2. Lupus -being followed by Dr. Estanislado Pandy and now is transferring care to [facility]  3. H/o AVN -  related to steroids s/p left THA.  4. Lupus anticoagulant positive-history of old stroke but that was related to vertebral artery dissection. -Reasonable to consider baby aspirin 81 mg p.o. daily.  Will defer this to her primary care physician.  5.  Leukopenia/neutropenia -likely autoimmune related to lupus.   Plan:  -discussed lab results from 03/27/2022 in detail with patient. CBC showed WBC of 6.2K, hemoglobin of 12.6, and platelets improved 181K -Patient had completed her induction dexamethasone and received 2 doses of IVIG in the hospital with normalization of platelet counts today  Rituxan orders reviewed and signed. -patient tolerating Rituxan infusion well when seen. -Rituxan for maintenance: weekly doses x4, then 2 doses every 6 months for rheumatoid arthritis -Patient shall continue to follow up with her rheumatologist  FOLLOW UP: Plz schedule remaining 3 weekly doses of Rituxan as per orders with labs F/u with Murray Hodgkins with C2 of Rituxan for toxicity check F/u with Dr Irene Limbo with C4 of Rituxan  for toxicity check  The total time spent in the appointment was 30 minutes* .  All of the  patient's questions were answered with apparent satisfaction. The patient knows to call the clinic with any problems, questions or concerns.   [name] MD MS AAHIVMS [facility] Hematology/Oncology Physician [facility]  .*Total Encounter Time as defined by the Centers for Medicare and Medicaid Services includes, in addition to the face-to-face time of a patient visit (documented in the note above) non-face-to-face time: obtaining and reviewing outside history, ordering and reviewing medications, tests or procedures, care coordination (communications with other health care professionals or caregivers) and documentation in the medical record.    I,Mitra Faeizi,acting as a Education administrator for [name], MD.,have documented all relevant documentation on the behalf of [name], MD,as directed by  [name], MD while in the presence of [name], MD.  .I have reviewed the above documentation for accuracy and completeness, and I agree with the above. Brunetta Genera MD

## 2022-04-02 ENCOUNTER — Other Ambulatory Visit: Payer: Self-pay | Admitting: Physician Assistant

## 2022-04-02 ENCOUNTER — Encounter: Payer: Self-pay | Admitting: Hematology

## 2022-04-02 DIAGNOSIS — D693 Immune thrombocytopenic purpura: Secondary | ICD-10-CM

## 2022-04-03 ENCOUNTER — Inpatient Hospital Stay: Payer: Medicare Other

## 2022-04-03 ENCOUNTER — Inpatient Hospital Stay: Payer: Medicare Other | Admitting: Physician Assistant

## 2022-04-03 ENCOUNTER — Other Ambulatory Visit: Payer: Self-pay

## 2022-04-03 VITALS — BP 153/83 | HR 78 | Temp 99.1°F | Resp 19 | Wt 184.2 lb

## 2022-04-03 DIAGNOSIS — D693 Immune thrombocytopenic purpura: Secondary | ICD-10-CM

## 2022-04-03 DIAGNOSIS — Z5112 Encounter for antineoplastic immunotherapy: Secondary | ICD-10-CM | POA: Diagnosis not present

## 2022-04-03 LAB — CMP (CANCER CENTER ONLY)
ALT: 19 U/L (ref 0–44)
AST: 17 U/L (ref 15–41)
Albumin: 3.7 g/dL (ref 3.5–5.0)
Alkaline Phosphatase: 65 U/L (ref 38–126)
Anion gap: 7 (ref 5–15)
BUN: 20 mg/dL (ref 8–23)
CO2: 26 mmol/L (ref 22–32)
Calcium: 9.4 mg/dL (ref 8.9–10.3)
Chloride: 102 mmol/L (ref 98–111)
Creatinine: 0.94 mg/dL (ref 0.44–1.00)
GFR, Estimated: >60 mL/min (ref >60)
Glucose, Bld: 215 mg/dL — ABNORMAL HIGH (ref 70–99)
Potassium: 3.9 mmol/L (ref 3.5–5.1)
Sodium: 135 mmol/L (ref 135–145)
Total Bilirubin: 0.6 mg/dL (ref 0.3–1.2)
Total Protein: 7.6 g/dL (ref 6.5–8.1)

## 2022-04-03 LAB — CBC WITH DIFFERENTIAL (CANCER CENTER ONLY)
Abs Immature Granulocytes: 0.11 10*3/uL — ABNORMAL HIGH (ref 0.00–0.07)
Basophils Absolute: 0 10*3/uL (ref 0.0–0.1)
Basophils Relative: 0 %
Eosinophils Absolute: 0 10*3/uL (ref 0.0–0.5)
Eosinophils Relative: 0 %
HCT: 39.3 % (ref 36.0–46.0)
Hemoglobin: 13.3 g/dL (ref 12.0–15.0)
Immature Granulocytes: 1 %
Lymphocytes Relative: 7 %
Lymphs Abs: 0.7 10*3/uL (ref 0.7–4.0)
MCH: 29.7 pg (ref 26.0–34.0)
MCHC: 33.8 g/dL (ref 30.0–36.0)
MCV: 87.7 fL (ref 80.0–100.0)
Monocytes Absolute: 0.2 10*3/uL (ref 0.1–1.0)
Monocytes Relative: 2 %
Neutro Abs: 8.5 10*3/uL — ABNORMAL HIGH (ref 1.7–7.7)
Neutrophils Relative %: 90 %
Platelet Count: 185 10*3/uL (ref 150–400)
RBC: 4.48 MIL/uL (ref 3.87–5.11)
RDW: 13.4 % (ref 11.5–15.5)
WBC Count: 9.4 10*3/uL (ref 4.0–10.5)
nRBC: 0 % (ref 0.0–0.2)

## 2022-04-03 MED ORDER — DIPHENHYDRAMINE HCL 25 MG PO CAPS
50.0000 mg | ORAL_CAPSULE | Freq: Once | ORAL | Status: AC
  Administered 2022-04-03: 50 mg via ORAL
  Filled 2022-04-03: qty 2

## 2022-04-03 MED ORDER — ACETAMINOPHEN 325 MG PO TABS
650.0000 mg | ORAL_TABLET | Freq: Once | ORAL | Status: AC
  Administered 2022-04-03: 650 mg via ORAL
  Filled 2022-04-03: qty 2

## 2022-04-03 MED ORDER — SODIUM CHLORIDE 0.9 % IV SOLN: Freq: Once | INTRAVENOUS | Status: AC

## 2022-04-03 MED ORDER — SODIUM CHLORIDE 0.9 % IV SOLN
375.0000 mg/m2 | Freq: Once | INTRAVENOUS | Status: AC
  Administered 2022-04-03: 700 mg via INTRAVENOUS
  Filled 2022-04-03: qty 50

## 2022-04-03 MED ORDER — FAMOTIDINE IN NACL 20-0.9 MG/50ML-% IV SOLN
20.0000 mg | Freq: Once | INTRAVENOUS | Status: AC
  Administered 2022-04-03: 20 mg via INTRAVENOUS
  Filled 2022-04-03: qty 50

## 2022-04-03 MED ORDER — METHYLPREDNISOLONE SODIUM SUCC 125 MG IJ SOLR
125.0000 mg | Freq: Every day | INTRAMUSCULAR | Status: DC
  Administered 2022-04-03: 125 mg via INTRAVENOUS
  Filled 2022-04-03: qty 2

## 2022-04-03 NOTE — Progress Notes (Signed)
HEMATOLOGY/ONCOLOGY CLINIC VISIT NOTE  Date of Service: 04/03/2022   Patient Care Team: Cipriano Mile, NP as PCP - General  REFERRING PHYSICIAN: Cipriano Mile, NP  CHIEF COMPLAINTS/PURPOSE OF CONSULTATION:  Follow-up for continued evaluation and management of ITP related to SLE  INTERVAL HISTORY  Elaine Allen is a 67 y.o. female here for continued evaluation and management of ITP related to SLE. Patient was last seen by Dr.  Limbo on 3/222024. He presents today for Cycle 2 of Rituxan.  She has been tolerating the treatment without significant limitations. She reports her energy and appetite are stable. She reports her join pain is manageable. She denies easy bruising or signs of bleeding. She denies fevers, chills, sweats, shortness of breath, chest pain, cough, nausea, vomiting, diarrhea or constipation. She has no other complaints.   MEDICAL HISTORY:  Past Medical History:  Diagnosis Date   Arthritis    Blind right eye    sees colors   Chronic anemia    Detached retina, right    corrected good for about 6 months sees peripheral colors only   History of kidney stones    Hypertension    Hyperthyroidism    Lupus ([place])    Pneumonia    Retinal detachment    Spasmodic dysphonia    Stroke [place]) 2015   no permanant defecits had weakness in left arm in hospital Had PT also   Vertigo    Hx of preceding stroke     SURGICAL HISTORY: Past Surgical History:  Procedure Laterality Date   ABDOMINAL HYSTERECTOMY     BREAST EXCISIONAL BIOPSY Left    KNEE SURGERY Left    THYROID SURGERY     TOTAL HIP ARTHROPLASTY Left 06/27/2018   Procedure: TOTAL HIP ARTHROPLASTY ANTERIOR APPROACH;  Surgeon: Frederik Pear, MD;  Location: [facility];  Service: Orthopedics;  Laterality: Left;     SOCIAL HISTORY: Social History   Socioeconomic History   Marital status: Single    Spouse name: Not on file   Number of children: 2   Years of education: college   Highest education level: Not  on file  Occupational History   Occupation: [employer]  Tobacco Use   Smoking status: Never    Passive exposure: Never   Smokeless tobacco: Never  Vaping Use   Vaping Use: Never used  Substance and Sexual Activity   Alcohol use: No   Drug use: No   Sexual activity: Not Currently  Other Topics Concern   Not on file  Social History Narrative   Not on file   Social Determinants of Health   Financial Resource Strain: Low Risk  (08/13/2020)   Overall Financial Resource Strain (CARDIA)    Difficulty of Paying Living Expenses: Not hard at all  Food Insecurity: No Food Insecurity (03/19/2022)   Hunger Vital Sign    Worried About Running Out of Food in the Last Year: Never true    [place] in the Last Year: Never true  Transportation Needs: No Transportation Needs (03/19/2022)   PRAPARE - Hydrologist (Medical): No    Lack of Transportation (Non-Medical): No  Physical Activity: Sufficiently Active (08/13/2020)   Exercise Vital Sign    Days of Exercise per Week: 5 days    Minutes of Exercise per Session: 30 min  Stress: No Stress Concern Present (08/13/2020)   [place]    Feeling of Stress : Not at  all  Social Connections: Moderately Integrated (08/13/2020)   Social Connection and Isolation Panel [NHANES]    Frequency of Communication with Friends and Family: More than three times a week    Frequency of Social Gatherings with Friends and Family: More than three times a week    Attends Religious Services: More than 4 times per year    Active Member of [employer] or Organizations: Yes    Attends Music therapist: More than 4 times per year    Marital Status: Divorced  Intimate Partner Violence: Not At Risk (03/19/2022)   Humiliation, Afraid, Rape, and Kick questionnaire    Fear of Current or Ex-Partner: No    Emotionally Abused: No    Physically Abused: No    Sexually Abused: No      FAMILY HISTORY: Family History  Problem Relation Age of Onset   Cancer Mother        liver   Diabetes Mother    Arthritis Mother    Cancer Father        prostate,METS, stomach    Ovarian cancer Sister    Throat cancer Brother      ALLERGIES:   is allergic to plaquenil [hydroxychloroquine sulfate], amoxicillin, azithromycin, lactose intolerance (gi), penicillin g, and hydroxychloroquine sulfate.   MEDICATIONS:  Current Outpatient Medications  Medication Sig Dispense Refill   alum & mag hydroxide-simeth (MAALOX/MYLANTA) 200-200-20 MG/5ML suspension Take 30 mLs by mouth every 4 (four) hours as needed for indigestion or heartburn. 355 mL 0   amLODipine (NORVASC) 10 MG tablet Take 10 mg by mouth daily.     cholecalciferol (VITAMIN D3) 25 MCG (1000 UNIT) tablet Take 1,000 Units by mouth daily.     loratadine (CLARITIN) 10 MG tablet Take 1 tablet (10 mg total) by mouth daily. 30 tablet 0   losartan-hydrochlorothiazide (HYZAAR) 100-25 MG tablet Take 1 tablet by mouth daily. 30 tablet 0   Multiple Vitamin (MULTIVITAMIN WITH MINERALS) TABS tablet Take 1 tablet by mouth daily.     Probiotic Product (PROBIOTIC PO) Take 1 capsule by mouth daily.     vitamin B-12 (CYANOCOBALAMIN) 1000 MCG tablet Take 1,000 mcg by mouth daily.     No current facility-administered medications for this visit.   Facility-Administered Medications Ordered in Other Visits  Medication Dose Route Frequency Provider Last Rate Last Admin   methylPREDNISolone sodium succinate (SOLU-MEDROL) 125 mg/2 mL injection 125 mg  125 mg Intravenous Daily Brunetta Genera, MD   125 mg at 04/03/22 [id number]    REVIEW OF SYSTEMS:    10 Point review of Systems was done is negative except as noted above.   PHYSICAL EXAMINATION: VS stable GENERAL:alert, in no acute distress and comfortable SKIN: no acute rashes, no significant lesions EYES: conjunctiva are pink and non-injected, sclera anicteric LUNGS: clear to  auscultation b/l with normal respiratory effort HEART: regular rate & rhythm Extremity: no pedal edema PSYCH: alert & oriented x 3 with fluent speech NEURO: no focal motor/sensory deficits   LABORATORY DATA:  I have reviewed the data as listed     Latest Ref Rng & Units 04/03/2022    9:23 AM 03/27/2022    9:36 AM 03/20/2022    4:40 AM  CBC  WBC 4.0 - 10.5 K/uL 9.4  6.2  6.4   Hemoglobin 12.0 - 15.0 g/dL 13.3  12.6  11.1   Hematocrit 36.0 - 46.0 % 39.3  36.5  33.5   Platelets 150 - 400 K/uL 185  181  48  ANC 1300     Latest Ref Rng & Units 04/03/2022    9:23 AM 03/27/2022    9:36 AM 03/20/2022    4:40 AM  CMP  Glucose 70 - 99 mg/dL 215  161  159   BUN 8 - 23 mg/dL 20  22  25    Creatinine 0.44 - 1.00 mg/dL 0.94  0.84  0.95   Sodium 135 - 145 mmol/L 135  134  133   Potassium 3.5 - 5.1 mmol/L 3.9  4.0  3.4   Chloride 98 - 111 mmol/L 102  105  98   CO2 22 - 32 mmol/L 26  21  26    Calcium 8.9 - 10.3 mg/dL 9.4  9.8  9.0   Total Protein 6.5 - 8.1 g/dL 7.6  8.6    Total Bilirubin 0.3 - 1.2 mg/dL 0.6  0.7    Alkaline Phos 38 - 126 U/L 65  72    AST 15 - 41 U/L 17  14    ALT 0 - 44 U/L 19  12     RADIOGRAPHIC STUDIES: I have personally reviewed the radiological images as listed and agreed with the findings in the report. CT Head Wo Contrast  Result Date: 03/18/2022 CLINICAL DATA:  Headache with platelet count of 3. EXAM: CT HEAD WITHOUT CONTRAST TECHNIQUE: Contiguous axial images were obtained from the base of the skull through the vertex without intravenous contrast. RADIATION DOSE REDUCTION: This exam was performed according to the departmental dose-optimization program which includes automated exposure control, adjustment of the mA and/or kV according to patient size and/or use of iterative reconstruction technique. COMPARISON:  Head CT 05/14/2013. FINDINGS: Brain: No acute intracranial hemorrhage. Encephalomalacia from old infarct in the right parietal lobe. Gray-white  differentiation is otherwise preserved. No hydrocephalus or extra-axial collection. No mass effect or midline shift. Vascular: No hyperdense vessel. Skull: No calvarial fracture. Heterogeneous appearance of the diploic space of the skull with innumerable lucent areas. Sinuses/Orbits: Unremarkable. Other: None. IMPRESSION: 1. No acute intracranial abnormality. 2. Old right parietal infarct. 3. Heterogeneous appearance of the diploic space of the skull with innumerable lucent areas. This may represent marrow expansion in the setting of known lupus and ITP. Electronically Signed   By: Emmit Alexanders M.D.   On: 03/18/2022 17:09    ASSESSMENT & PLAN:   Saachi Giger is a 67 y.o. female with:  1.  H/o Relapsing ITP due to lupus -Likely related to Immune thrombocytopenia in the setting of lupus. -Patient was treated for her relapsed ITP with 4 doses of Rituxan from 03/31/2019-04/21/2019, 07/26/2020-08/19/2020. -Due to relapsed ITP, restarted Rituxan therapy weekly x 4 cycles on 03/27/2022.   2. Lupus -being followed by Dr. Estanislado Pandy and now is transferring care to [facility]  3. H/o AVN -  related to steroids s/p left THA.  4. Lupus anticoagulant positive-history of old stroke but that was related to vertebral artery dissection. -Reasonable to consider baby aspirin 81 mg p.o. daily.  Will defer this to her primary care physician.  5.  Leukopenia/neutropenia -likely autoimmune related to lupus.   Plan: -Due for Cycle 2 of Rituxan today -Labs from today were reviewed and require no intervention. No cytopenias and Plt are normal at 185K. Creatinine and LFTs normal. -Patient tolerating Rituxan infusion -Plan is weekly doses x4, then 2 doses every 6 months for rheumatoid arthritis  FOLLOW UP: Continue with weekly Rituxan with C3 on 04/10/2022 F/u with Dr  Limbo with C4 of Rituxan for  toxicity check on 04/17/2022   All of the patient's questions were answered with apparent satisfaction. The  patient knows to call the clinic with any problems, questions or concerns.  I have spent a total of 25 minutes minutes of face-to-face and non-face-to-face time, preparing to see the [place] a medically appropriate examination, counseling and educating the patient, documenting clinical information in the electronic health record, and care coordination.   Dede Query PA-C Dept of Hematology and [place] at [facility] Phone: [phone]

## 2022-04-03 NOTE — Patient Instructions (Signed)
[facility]  Discharge Instructions: Thank you for choosing [facility] to provide your oncology and hematology care.   If you have a lab appointment with the Cancer Center, please go directly to the Cancer Center and check in at the registration area.   Wear comfortable clothing and clothing appropriate for easy access to any Portacath or PICC line.   We strive to give you quality time with your provider. You may need to reschedule your appointment if you arrive late (15 or more minutes).  Arriving late affects you and other patients whose appointments are after yours.  Also, if you miss three or more appointments without notifying the office, you may be dismissed from the clinic at the provider's discretion.      For prescription refill requests, have your pharmacy contact our office and allow 72 hours for refills to be completed.    Today you received the following chemotherapy and/or immunotherapy agents: Rituximab.       To help prevent nausea and vomiting after your treatment, we encourage you to take your nausea medication as directed.  BELOW ARE SYMPTOMS THAT SHOULD BE REPORTED IMMEDIATELY: *FEVER GREATER THAN 100.4 F (38 C) OR HIGHER *CHILLS OR SWEATING *NAUSEA AND VOMITING THAT IS NOT CONTROLLED WITH YOUR NAUSEA MEDICATION *UNUSUAL SHORTNESS OF BREATH *UNUSUAL BRUISING OR BLEEDING *URINARY PROBLEMS (pain or burning when urinating, or frequent urination) *BOWEL PROBLEMS (unusual diarrhea, constipation, pain near the anus) TENDERNESS IN MOUTH AND THROAT WITH OR WITHOUT PRESENCE OF ULCERS (sore throat, sores in mouth, or a toothache) UNUSUAL RASH, SWELLING OR PAIN  UNUSUAL VAGINAL DISCHARGE OR ITCHING   Items with * indicate a potential emergency and should be followed up as soon as possible or go to the Emergency Department if any problems should occur.  Please show the CHEMOTHERAPY ALERT CARD or IMMUNOTHERAPY ALERT CARD at  check-in to the Emergency Department and triage nurse.  Should you have questions after your visit or need to cancel or reschedule your appointment, please contact [facility]  Dept: [phone]  and follow the prompts.  Office hours are 8:00 a.m. to 4:30 p.m. Monday - Friday. Please note that voicemails left after 4:00 p.m. may not be returned until the following business day.  We are closed weekends and major holidays. You have access to a nurse at all times for urgent questions. Please call the main number to the clinic Dept: [phone] and follow the prompts.   For any non-urgent questions, you may also contact your provider using MyChart. We now offer e-Visits for anyone 18 and older to request care online for non-urgent symptoms. For details visit [email or web address].   Also download the MyChart app! Go to the app store, search "MyChart", open the app, select [place], and log in with your MyChart username and password.   

## 2022-04-09 ENCOUNTER — Other Ambulatory Visit: Payer: Self-pay

## 2022-04-09 DIAGNOSIS — D693 Immune thrombocytopenic purpura: Secondary | ICD-10-CM

## 2022-04-10 ENCOUNTER — Inpatient Hospital Stay: Payer: Medicare Other

## 2022-04-10 ENCOUNTER — Inpatient Hospital Stay: Payer: Medicare Other | Attending: Hematology

## 2022-04-10 DIAGNOSIS — R410 Disorientation, unspecified: Secondary | ICD-10-CM | POA: Diagnosis not present

## 2022-04-10 DIAGNOSIS — D6862 Lupus anticoagulant syndrome: Secondary | ICD-10-CM | POA: Diagnosis not present

## 2022-04-10 DIAGNOSIS — Z79899 Other long term (current) drug therapy: Secondary | ICD-10-CM | POA: Insufficient documentation

## 2022-04-10 DIAGNOSIS — D693 Immune thrombocytopenic purpura: Secondary | ICD-10-CM | POA: Diagnosis present

## 2022-04-10 DIAGNOSIS — I10 Essential (primary) hypertension: Secondary | ICD-10-CM | POA: Diagnosis not present

## 2022-04-10 DIAGNOSIS — Z87440 Personal history of urinary (tract) infections: Secondary | ICD-10-CM | POA: Diagnosis not present

## 2022-04-10 DIAGNOSIS — Z5112 Encounter for antineoplastic immunotherapy: Secondary | ICD-10-CM | POA: Diagnosis present

## 2022-04-10 DIAGNOSIS — R6883 Chills (without fever): Secondary | ICD-10-CM | POA: Insufficient documentation

## 2022-04-10 LAB — CBC WITH DIFFERENTIAL (CANCER CENTER ONLY)
Abs Immature Granulocytes: 0.03 10*3/uL (ref 0.00–0.07)
Basophils Absolute: 0 10*3/uL (ref 0.0–0.1)
Basophils Relative: 0 %
Eosinophils Absolute: 0.2 10*3/uL (ref 0.0–0.5)
Eosinophils Relative: 6 %
HCT: 37.5 % (ref 36.0–46.0)
Hemoglobin: 12.5 g/dL (ref 12.0–15.0)
Immature Granulocytes: 1 %
Lymphocytes Relative: 38 %
Lymphs Abs: 1.4 10*3/uL (ref 0.7–4.0)
MCH: 30.1 pg (ref 26.0–34.0)
MCHC: 33.3 g/dL (ref 30.0–36.0)
MCV: 90.4 fL (ref 80.0–100.0)
Monocytes Absolute: 0.5 10*3/uL (ref 0.1–1.0)
Monocytes Relative: 12 %
Neutro Abs: 1.6 10*3/uL — ABNORMAL LOW (ref 1.7–7.7)
Neutrophils Relative %: 43 %
Platelet Count: 146 10*3/uL — ABNORMAL LOW (ref 150–400)
RBC: 4.15 MIL/uL (ref 3.87–5.11)
RDW: 14.3 % (ref 11.5–15.5)
Smear Review: NORMAL
WBC Count: 3.8 10*3/uL — ABNORMAL LOW (ref 4.0–10.5)
nRBC: 0 % (ref 0.0–0.2)

## 2022-04-10 LAB — CMP (CANCER CENTER ONLY)
ALT: 18 U/L (ref 0–44)
AST: 18 U/L (ref 15–41)
Albumin: 3.4 g/dL — ABNORMAL LOW (ref 3.5–5.0)
Alkaline Phosphatase: 60 U/L (ref 38–126)
Anion gap: 6 (ref 5–15)
BUN: 10 mg/dL (ref 8–23)
CO2: 27 mmol/L (ref 22–32)
Calcium: 9 mg/dL (ref 8.9–10.3)
Chloride: 103 mmol/L (ref 98–111)
Creatinine: 0.9 mg/dL (ref 0.44–1.00)
GFR, Estimated: >60 mL/min (ref >60)
Glucose, Bld: 129 mg/dL — ABNORMAL HIGH (ref 70–99)
Potassium: 3.7 mmol/L (ref 3.5–5.1)
Sodium: 136 mmol/L (ref 135–145)
Total Bilirubin: 0.7 mg/dL (ref 0.3–1.2)
Total Protein: 6.7 g/dL (ref 6.5–8.1)

## 2022-04-13 ENCOUNTER — Inpatient Hospital Stay: Payer: Medicare Other

## 2022-04-13 ENCOUNTER — Inpatient Hospital Stay: Payer: Medicare Other | Admitting: Hematology

## 2022-04-15 ENCOUNTER — Other Ambulatory Visit: Payer: Self-pay

## 2022-04-15 DIAGNOSIS — D693 Immune thrombocytopenic purpura: Secondary | ICD-10-CM

## 2022-04-17 ENCOUNTER — Other Ambulatory Visit: Payer: Self-pay | Admitting: Hematology and Oncology

## 2022-04-17 ENCOUNTER — Other Ambulatory Visit: Payer: Self-pay

## 2022-04-17 ENCOUNTER — Inpatient Hospital Stay: Payer: Medicare Other

## 2022-04-17 ENCOUNTER — Inpatient Hospital Stay (HOSPITAL_BASED_OUTPATIENT_CLINIC_OR_DEPARTMENT_OTHER): Payer: Medicare Other | Admitting: Hematology

## 2022-04-17 ENCOUNTER — Other Ambulatory Visit: Payer: Medicare Other

## 2022-04-17 VITALS — BP 157/97 | HR 92 | Temp 97.3°F | Resp 20 | Wt 185.0 lb

## 2022-04-17 VITALS — BP 146/86 | HR 88 | Resp 18

## 2022-04-17 DIAGNOSIS — D693 Immune thrombocytopenic purpura: Secondary | ICD-10-CM

## 2022-04-17 DIAGNOSIS — Z5112 Encounter for antineoplastic immunotherapy: Secondary | ICD-10-CM | POA: Diagnosis not present

## 2022-04-17 LAB — CBC WITH DIFFERENTIAL (CANCER CENTER ONLY)
Abs Immature Granulocytes: 0.01 10*3/uL (ref 0.00–0.07)
Basophils Absolute: 0 10*3/uL (ref 0.0–0.1)
Basophils Relative: 1 %
Eosinophils Absolute: 0.1 10*3/uL (ref 0.0–0.5)
Eosinophils Relative: 3 %
HCT: 36.4 % (ref 36.0–46.0)
Hemoglobin: 12.2 g/dL (ref 12.0–15.0)
Immature Granulocytes: 0 %
Lymphocytes Relative: 64 %
Lymphs Abs: 2.7 10*3/uL (ref 0.7–4.0)
MCH: 29.9 pg (ref 26.0–34.0)
MCHC: 33.5 g/dL (ref 30.0–36.0)
MCV: 89.2 fL (ref 80.0–100.0)
Monocytes Absolute: 0.4 10*3/uL (ref 0.1–1.0)
Monocytes Relative: 10 %
Neutro Abs: 0.9 10*3/uL — ABNORMAL LOW (ref 1.7–7.7)
Neutrophils Relative %: 22 %
Platelet Count: 192 10*3/uL (ref 150–400)
RBC: 4.08 MIL/uL (ref 3.87–5.11)
RDW: 14.3 % (ref 11.5–15.5)
Smear Review: NORMAL
WBC Count: 4.2 10*3/uL (ref 4.0–10.5)
nRBC: 0 % (ref 0.0–0.2)

## 2022-04-17 LAB — CMP (CANCER CENTER ONLY)
ALT: 9 U/L (ref 0–44)
AST: 15 U/L (ref 15–41)
Albumin: 3.6 g/dL (ref 3.5–5.0)
Alkaline Phosphatase: 82 U/L (ref 38–126)
Anion gap: 5 (ref 5–15)
BUN: 11 mg/dL (ref 8–23)
CO2: 26 mmol/L (ref 22–32)
Calcium: 9.5 mg/dL (ref 8.9–10.3)
Chloride: 110 mmol/L (ref 98–111)
Creatinine: 0.8 mg/dL (ref 0.44–1.00)
GFR, Estimated: >60 mL/min (ref >60)
Glucose, Bld: 101 mg/dL — ABNORMAL HIGH (ref 70–99)
Potassium: 3.7 mmol/L (ref 3.5–5.1)
Sodium: 141 mmol/L (ref 135–145)
Total Bilirubin: 0.6 mg/dL (ref 0.3–1.2)
Total Protein: 6.6 g/dL (ref 6.5–8.1)

## 2022-04-17 LAB — URINALYSIS, COMPLETE (UACMP) WITH MICROSCOPIC
Bacteria, UA: NONE SEEN
Bilirubin Urine: NEGATIVE
Glucose, UA: NEGATIVE mg/dL
Hgb urine dipstick: NEGATIVE
Ketones, ur: NEGATIVE mg/dL
Leukocytes,Ua: NEGATIVE
Nitrite: NEGATIVE
Protein, ur: NEGATIVE mg/dL
Specific Gravity, Urine: 1.012 (ref 1.005–1.030)
pH: 5 (ref 5.0–8.0)

## 2022-04-17 MED ORDER — ACETAMINOPHEN 325 MG PO TABS
650.0000 mg | ORAL_TABLET | Freq: Once | ORAL | Status: AC
  Administered 2022-04-17: 650 mg via ORAL
  Filled 2022-04-17: qty 2

## 2022-04-17 MED ORDER — DIPHENHYDRAMINE HCL 25 MG PO CAPS
25.0000 mg | ORAL_CAPSULE | Freq: Once | ORAL | Status: AC
  Administered 2022-04-17: 25 mg via ORAL
  Filled 2022-04-17: qty 1

## 2022-04-17 MED ORDER — SODIUM CHLORIDE 0.9 % IV SOLN
375.0000 mg/m2 | Freq: Once | INTRAVENOUS | Status: AC
  Administered 2022-04-17: 700 mg via INTRAVENOUS
  Filled 2022-04-17: qty 50

## 2022-04-17 MED ORDER — METHYLPREDNISOLONE SODIUM SUCC 125 MG IJ SOLR
125.0000 mg | Freq: Every day | INTRAMUSCULAR | Status: DC
  Administered 2022-04-17: 125 mg via INTRAVENOUS
  Filled 2022-04-17: qty 2

## 2022-04-17 MED ORDER — FAMOTIDINE IN NACL 20-0.9 MG/50ML-% IV SOLN
20.0000 mg | Freq: Once | INTRAVENOUS | Status: AC
  Administered 2022-04-17: 20 mg via INTRAVENOUS
  Filled 2022-04-17: qty 50

## 2022-04-17 MED ORDER — SODIUM CHLORIDE 0.9 % IV SOLN: Freq: Once | INTRAVENOUS | Status: AC

## 2022-04-17 NOTE — Progress Notes (Signed)
Patient seen by Dr. Addison Naegeli are within treatment parameters.  Labs reviewed: and are within treatment parameters.  Per physician team, patient is ready for treatment and there are NO modifications to the treatment plan. Pt for U/A and U/C today.

## 2022-04-17 NOTE — Patient Instructions (Signed)
[facility]  Discharge Instructions: Thank you for choosing [facility] to provide your oncology and hematology care.   If you have a lab appointment with the Cancer Center, please go directly to the Cancer Center and check in at the registration area.   Wear comfortable clothing and clothing appropriate for easy access to any Portacath or PICC line.   We strive to give you quality time with your provider. You may need to reschedule your appointment if you arrive late (15 or more minutes).  Arriving late affects you and other patients whose appointments are after yours.  Also, if you miss three or more appointments without notifying the office, you may be dismissed from the clinic at the provider's discretion.      For prescription refill requests, have your pharmacy contact our office and allow 72 hours for refills to be completed.    Today you received the following chemotherapy and/or immunotherapy agents: Ruxience      To help prevent nausea and vomiting after your treatment, we encourage you to take your nausea medication as directed.  BELOW ARE SYMPTOMS THAT SHOULD BE REPORTED IMMEDIATELY: *FEVER GREATER THAN 100.4 F (38 C) OR HIGHER *CHILLS OR SWEATING *NAUSEA AND VOMITING THAT IS NOT CONTROLLED WITH YOUR NAUSEA MEDICATION *UNUSUAL SHORTNESS OF BREATH *UNUSUAL BRUISING OR BLEEDING *URINARY PROBLEMS (pain or burning when urinating, or frequent urination) *BOWEL PROBLEMS (unusual diarrhea, constipation, pain near the anus) TENDERNESS IN MOUTH AND THROAT WITH OR WITHOUT PRESENCE OF ULCERS (sore throat, sores in mouth, or a toothache) UNUSUAL RASH, SWELLING OR PAIN  UNUSUAL VAGINAL DISCHARGE OR ITCHING   Items with * indicate a potential emergency and should be followed up as soon as possible or go to the Emergency Department if any problems should occur.  Please show the CHEMOTHERAPY ALERT CARD or IMMUNOTHERAPY ALERT CARD at  check-in to the Emergency Department and triage nurse.  Should you have questions after your visit or need to cancel or reschedule your appointment, please contact [facility]  Dept: [phone]  and follow the prompts.  Office hours are 8:00 a.m. to 4:30 p.m. Monday - Friday. Please note that voicemails left after 4:00 p.m. may not be returned until the following business day.  We are closed weekends and major holidays. You have access to a nurse at all times for urgent questions. Please call the main number to the clinic Dept: [phone] and follow the prompts.   For any non-urgent questions, you may also contact your provider using MyChart. We now offer e-Visits for anyone 18 and older to request care online for non-urgent symptoms. For details visit [email or web address].   Also download the MyChart app! Go to the app store, search "MyChart", open the app, select [place], and log in with your MyChart username and password.   

## 2022-04-17 NOTE — Progress Notes (Signed)
HEMATOLOGY/ONCOLOGY CLINIC VISIT NOTE  Date of Service: 04/17/22   Patient Care Team: Hillery Aldo, NP as PCP - General Candise Che Corene Cornea, MD as Consulting Physician (Hematology)  REFERRING PHYSICIAN: Hillery Aldo, NP  CHIEF COMPLAINTS/PURPOSE OF CONSULTATION:  Follow-up for continued evaluation and management of ITP related to SLE  INTERVAL HISTORY  Elaine Allen is a 67 y.o. female here for continued evaluation and management of ITP related to SLE. Patient was last seen by me on 03/27/2022 and was doing well overall with no new medical concerns. She did note some mild joint pain related to rheumatoid arthritis. She was most recently seen by Thayil PA on 04/03/2022 and was doing well overall with manageable joint pain. Patient is scheduled to receive day 1 cycle 3 of Rituxan today.  Today, she complains of brain fog which began last week. Patient reports that once home from the hospital, she felt "off"and like she was "walking in a tunnel". She recalls one episode of confusion as very scary. This sensation has been intermittent, and has improved this week. Patient denies any headaches or change in vision. Benadryl has not caused confusion in the past. She denies any other stressors at home, and she does go outdoors regularly.   She reports that she has been sleeping better than usual, receiving at least 6-7 hours a night. She is not on oral prednisone at this time.  Patient reports that her joints have been okay. She was recently seen by her rheumatologist and it was determined that her confusion was not suggested to be related to lupus.  Patient denies any fever or discomfort passing urine. She does note having a UTI several years ago. She denies any bleeding issues, change in urine/bowel habits, or abdominal pain. Patient does endorse chills on occasion.  MEDICAL HISTORY:  Past Medical History:  Diagnosis Date   Arthritis    Blind right eye    sees colors   Chronic  anemia    Detached retina, right    corrected good for about 6 months sees peripheral colors only   History of kidney stones    Hypertension    Hyperthyroidism    Lupus (HCC)    Pneumonia    Retinal detachment    Spasmodic dysphonia    Stroke (HCC) 2015   no permanant defecits had weakness in left arm in hospital Had PT also   Vertigo    Hx of preceding stroke     SURGICAL HISTORY: Past Surgical History:  Procedure Laterality Date   ABDOMINAL HYSTERECTOMY     BREAST EXCISIONAL BIOPSY Left    KNEE SURGERY Left    THYROID SURGERY     TOTAL HIP ARTHROPLASTY Left 06/27/2018   Procedure: TOTAL HIP ARTHROPLASTY ANTERIOR APPROACH;  Surgeon: Gean Birchwood, MD;  Location: [facility];  Service: Orthopedics;  Laterality: Left;     SOCIAL HISTORY: Social History   Socioeconomic History   Marital status: Single    Spouse name: Not on file   Number of children: 2   Years of education: college   Highest education level: Not on file  Occupational History   Occupation: options  Tobacco Use   Smoking status: Never    Passive exposure: Never   Smokeless tobacco: Never  Vaping Use   Vaping Use: Never used  Substance and Sexual Activity   Alcohol use: No   Drug use: No   Sexual activity: Not Currently  Other Topics Concern   Not on file  Social History Narrative   Not on file   Social Determinants of Health   Financial Resource Strain: Low Risk  (08/13/2020)   Overall Financial Resource Strain (CARDIA)    Difficulty of Paying Living Expenses: Not hard at all  Food Insecurity: No Food Insecurity (03/19/2022)   Hunger Vital Sign    Worried About Running Out of Food in the Last Year: Never true    Ran Out of Food in the Last Year: Never true  Transportation Needs: No Transportation Needs (03/19/2022)   PRAPARE - Administrator, Civil Service (Medical): No    Lack of Transportation (Non-Medical): No  Physical Activity: Sufficiently Active (08/13/2020)   Exercise Vital Sign     Days of Exercise per Week: 5 days    Minutes of Exercise per Session: 30 min  Stress: No Stress Concern Present (08/13/2020)   [employer] of Occupational Health - Occupational Stress Questionnaire    Feeling of Stress : Not at all  Social Connections: Moderately Integrated (08/13/2020)   Social Connection and Isolation Panel [NHANES]    Frequency of Communication with Friends and Family: More than three times a week    Frequency of Social Gatherings with Friends and Family: More than three times a week    Attends Religious Services: More than 4 times per year    Active Member of [employer] or Organizations: Yes    Attends Engineer, structural: More than 4 times per year    Marital Status: Divorced  Intimate Partner Violence: Not At Risk (03/19/2022)   Humiliation, Afraid, Rape, and Kick questionnaire    Fear of Current or Ex-Partner: No    Emotionally Abused: No    Physically Abused: No    Sexually Abused: No     FAMILY HISTORY: Family History  Problem Relation Age of Onset   Cancer Mother        liver   Diabetes Mother    Arthritis Mother    Cancer Father        prostate,METS, stomach    Ovarian cancer Sister    Throat cancer Brother      ALLERGIES:   is allergic to plaquenil [hydroxychloroquine sulfate], amoxicillin, azithromycin, lactose intolerance (gi), penicillin g, and hydroxychloroquine sulfate.   MEDICATIONS:  Current Outpatient Medications  Medication Sig Dispense Refill   alum & mag hydroxide-simeth (MAALOX/MYLANTA) 200-200-20 MG/5ML suspension Take 30 mLs by mouth every 4 (four) hours as needed for indigestion or heartburn. 355 mL 0   amLODipine (NORVASC) 10 MG tablet Take 10 mg by mouth daily.     cholecalciferol (VITAMIN D3) 25 MCG (1000 UNIT) tablet Take 1,000 Units by mouth daily.     loratadine (CLARITIN) 10 MG tablet Take 1 tablet (10 mg total) by mouth daily. 30 tablet 0   losartan-hydrochlorothiazide (HYZAAR) 100-25 MG tablet Take 1 tablet  by mouth daily. 30 tablet 0   Multiple Vitamin (MULTIVITAMIN WITH MINERALS) TABS tablet Take 1 tablet by mouth daily.     Probiotic Product (PROBIOTIC PO) Take 1 capsule by mouth daily.     vitamin B-12 (CYANOCOBALAMIN) 1000 MCG tablet Take 1,000 mcg by mouth daily.     No current facility-administered medications for this visit.    REVIEW OF SYSTEMS:    10 Point review of Systems was done is negative except as noted above.   PHYSICAL EXAMINATION:  GENERAL:alert, in no acute distress and comfortable SKIN: no acute rashes, no significant lesions EYES: conjunctiva are pink and non-injected, sclera  anicteric OROPHARYNX: MMM, no exudates, no oropharyngeal erythema or ulceration NECK: supple, no JVD LYMPH:  no palpable lymphadenopathy in the cervical, axillary or inguinal regions LUNGS: clear to auscultation b/l with normal respiratory effort HEART: regular rate & rhythm ABDOMEN:  normoactive bowel sounds , non tender, not distended. Extremity: no pedal edema PSYCH: alert & oriented x 3 with fluent speech NEURO: no focal motor/sensory deficits   LABORATORY DATA:  I have reviewed the data as listed     Latest Ref Rng & Units 04/17/2022    1:05 PM 04/10/2022    1:35 PM 04/03/2022    9:23 AM  CBC  WBC 4.0 - 10.5 K/uL 4.2  3.8  9.4   Hemoglobin 12.0 - 15.0 g/dL 09.8  11.9  14.7   Hematocrit 36.0 - 46.0 % 36.4  37.5  39.3   Platelets 150 - 400 K/uL 192  146  185   ANC 1300     Latest Ref Rng & Units 04/17/2022    1:05 PM 04/10/2022    1:35 PM 04/03/2022    9:23 AM  CMP  Glucose 70 - 99 mg/dL 829  562  130   BUN 8 - 23 mg/dL Creatinine 0.44 - 1.00 mg/dL 8.65  7.84  6.96   Sodium 135 - 145 mmol/L 141  136  135   Potassium 3.5 - 5.1 mmol/L 3.7  3.7  3.9   Chloride 98 - 111 mmol/L 110  103  102   CO2 22 - 32 mmol/L Calcium 8.9 - 10.3 mg/dL 9.5  9.0  9.4   Total Protein 6.5 - 8.1 g/dL 6.6  6.7  7.6   Total Bilirubin 0.3 - 1.2 mg/dL 0.6  0.7  0.6   Alkaline  Phos 38 - 126 U/L 82  60  65   AST 15 - 41 U/L ALT 0 - 44 U/L RADIOGRAPHIC STUDIES: I have personally reviewed the radiological images as listed and agreed with the findings in the report. CT Head Wo Contrast  Result Date: 03/18/2022 CLINICAL DATA:  Headache with platelet count of 3. EXAM: CT HEAD WITHOUT CONTRAST TECHNIQUE: Contiguous axial images were obtained from the base of the skull through the vertex without intravenous contrast. RADIATION DOSE REDUCTION: This exam was performed according to the departmental dose-optimization program which includes automated exposure control, adjustment of the mA and/or kV according to patient size and/or use of iterative reconstruction technique. COMPARISON:  Head CT 05/14/2013. FINDINGS: Brain: No acute intracranial hemorrhage. Encephalomalacia from old infarct in the right parietal lobe. Gray-white differentiation is otherwise preserved. No hydrocephalus or extra-axial collection. No mass effect or midline shift. Vascular: No hyperdense vessel. Skull: No calvarial fracture. Heterogeneous appearance of the diploic space of the skull with innumerable lucent areas. Sinuses/Orbits: Unremarkable. Other: None. IMPRESSION: 1. No acute intracranial abnormality. 2. Old right parietal infarct. 3. Heterogeneous appearance of the diploic space of the skull with innumerable lucent areas. This may represent marrow expansion in the setting of known lupus and ITP. Electronically Signed   By: Orvan Falconer M.D.   On: 03/18/2022 17:09    ASSESSMENT & PLAN:   Elaine Allen is a 67 y.o. female with:  1.  H/o Relapsing ITP due to lupus -Likely related to Immune thrombocytopenia in the setting of lupus. Patient was treated for her relapsed ITP  with 4 doses of Rituxan again.  2. Lupus -being followed by Dr. Corliss Skains and now is transferring care to [facility]  3. H/o AVN -  related to steroids s/p left THA.  4. Lupus anticoagulant  positive-history of old stroke but that was related to vertebral artery dissection. -Reasonable to consider baby aspirin 81 mg p.o. daily.  Will defer this to her primary care physician.  5.  Leukopenia/neutropenia -likely autoimmune related to lupus.   Plan:  -Discussed lab results on 04/17/2022 with patient. CBC showed WBC of 4.2, hemoglobin of 12.2, and platelets normalized to 192K. -CBC normal -CMP normal -Patient's brian fog may be due to high-dose steroid  -will lower dose of Benadryl to 25 MG -will order urine sample to rule out urinary infection -patient tolerating Rituxan infusion well when seen. -Rituxan for maintenance: weekly doses x4, then 2 doses every 6 months for rheumatoid arthritis -Patient shall continue to follow up with her rheumatologist  FOLLOW UP: ***  The total time spent in the appointment was *** minutes* .  All of the patient's questions were answered with apparent satisfaction. The patient knows to call the clinic with any problems, questions or concerns.   Wyvonnia Lora MD MS AAHIVMS [facility] Hematology/Oncology Physician [facility]  .*Total Encounter Time as defined by the Centers for Medicare and Medicaid Services includes, in addition to the face-to-face time of a patient visit (documented in the note above) non-face-to-face time: obtaining and reviewing outside history, ordering and reviewing medications, tests or procedures, care coordination (communications with other health care professionals or caregivers) and documentation in the medical record.    I,Mitra Faeizi,acting as a Neurosurgeon for Wyvonnia Lora, MD.,have documented all relevant documentation on the behalf of Wyvonnia Lora, MD,as directed by  Wyvonnia Lora, MD while in the presence of Wyvonnia Lora, MD.  ***

## 2022-04-19 LAB — URINE CULTURE: Culture: NO GROWTH

## 2022-04-23 ENCOUNTER — Encounter: Payer: Self-pay | Admitting: Hematology

## 2022-04-23 ENCOUNTER — Other Ambulatory Visit: Payer: Self-pay

## 2022-04-23 DIAGNOSIS — Z5112 Encounter for antineoplastic immunotherapy: Secondary | ICD-10-CM

## 2022-04-24 ENCOUNTER — Inpatient Hospital Stay: Payer: Medicare Other

## 2022-04-24 VITALS — BP 156/86 | HR 84 | Temp 98.5°F | Resp 17

## 2022-04-24 DIAGNOSIS — Z5112 Encounter for antineoplastic immunotherapy: Secondary | ICD-10-CM | POA: Diagnosis not present

## 2022-04-24 DIAGNOSIS — D693 Immune thrombocytopenic purpura: Secondary | ICD-10-CM

## 2022-04-24 LAB — CMP (CANCER CENTER ONLY)
ALT: 11 U/L (ref 0–44)
AST: 17 U/L (ref 15–41)
Albumin: 3.9 g/dL (ref 3.5–5.0)
Alkaline Phosphatase: 91 U/L (ref 38–126)
Anion gap: 5 (ref 5–15)
BUN: 12 mg/dL (ref 8–23)
CO2: 28 mmol/L (ref 22–32)
Calcium: 9.7 mg/dL (ref 8.9–10.3)
Chloride: 107 mmol/L (ref 98–111)
Creatinine: 0.81 mg/dL (ref 0.44–1.00)
GFR, Estimated: >60 mL/min (ref >60)
Glucose, Bld: 128 mg/dL — ABNORMAL HIGH (ref 70–99)
Potassium: 3.4 mmol/L — ABNORMAL LOW (ref 3.5–5.1)
Sodium: 140 mmol/L (ref 135–145)
Total Bilirubin: 0.6 mg/dL (ref 0.3–1.2)
Total Protein: 7.1 g/dL (ref 6.5–8.1)

## 2022-04-24 LAB — CBC WITH DIFFERENTIAL (CANCER CENTER ONLY)
Abs Immature Granulocytes: 0.02 10*3/uL (ref 0.00–0.07)
Basophils Absolute: 0 10*3/uL (ref 0.0–0.1)
Basophils Relative: 1 %
Eosinophils Absolute: 0.1 10*3/uL (ref 0.0–0.5)
Eosinophils Relative: 2 %
HCT: 38.7 % (ref 36.0–46.0)
Hemoglobin: 13.1 g/dL (ref 12.0–15.0)
Immature Granulocytes: 1 %
Lymphocytes Relative: 62 %
Lymphs Abs: 2.4 10*3/uL (ref 0.7–4.0)
MCH: 30 pg (ref 26.0–34.0)
MCHC: 33.9 g/dL (ref 30.0–36.0)
MCV: 88.8 fL (ref 80.0–100.0)
Monocytes Absolute: 0.5 10*3/uL (ref 0.1–1.0)
Monocytes Relative: 14 %
Neutro Abs: 0.7 10*3/uL — ABNORMAL LOW (ref 1.7–7.7)
Neutrophils Relative %: 20 %
Platelet Count: 239 10*3/uL (ref 150–400)
RBC: 4.36 MIL/uL (ref 3.87–5.11)
RDW: 15 % (ref 11.5–15.5)
WBC Count: 3.8 10*3/uL — ABNORMAL LOW (ref 4.0–10.5)
nRBC: 0 % (ref 0.0–0.2)

## 2022-04-24 MED ORDER — SODIUM CHLORIDE 0.9 % IV SOLN
375.0000 mg/m2 | Freq: Once | INTRAVENOUS | Status: AC
  Administered 2022-04-24: 700 mg via INTRAVENOUS
  Filled 2022-04-24: qty 50

## 2022-04-24 MED ORDER — METHYLPREDNISOLONE SODIUM SUCC 125 MG IJ SOLR
125.0000 mg | Freq: Once | INTRAMUSCULAR | Status: AC
  Administered 2022-04-24: 125 mg via INTRAVENOUS
  Filled 2022-04-24: qty 2

## 2022-04-24 MED ORDER — FAMOTIDINE IN NACL 20-0.9 MG/50ML-% IV SOLN
20.0000 mg | Freq: Once | INTRAVENOUS | Status: AC
  Administered 2022-04-24: 20 mg via INTRAVENOUS
  Filled 2022-04-24: qty 50

## 2022-04-24 MED ORDER — ACETAMINOPHEN 325 MG PO TABS
650.0000 mg | ORAL_TABLET | Freq: Once | ORAL | Status: AC
  Administered 2022-04-24: 650 mg via ORAL
  Filled 2022-04-24: qty 2

## 2022-04-24 MED ORDER — SODIUM CHLORIDE 0.9 % IV SOLN: Freq: Once | INTRAVENOUS | Status: AC

## 2022-04-24 MED ORDER — DIPHENHYDRAMINE HCL 25 MG PO CAPS
25.0000 mg | ORAL_CAPSULE | Freq: Once | ORAL | Status: AC
  Administered 2022-04-24: 25 mg via ORAL
  Filled 2022-04-24: qty 1

## 2022-04-24 NOTE — Patient Instructions (Signed)
[facility]  Discharge Instructions: Thank you for choosing [facility] to provide your oncology and hematology care.   If you have a lab appointment with the Cancer Center, please go directly to the Cancer Center and check in at the registration area.   Wear comfortable clothing and clothing appropriate for easy access to any Portacath or PICC line.   We strive to give you quality time with your provider. You may need to reschedule your appointment if you arrive late (15 or more minutes).  Arriving late affects you and other patients whose appointments are after yours.  Also, if you miss three or more appointments without notifying the office, you may be dismissed from the clinic at the provider's discretion.      For prescription refill requests, have your pharmacy contact our office and allow 72 hours for refills to be completed.    Today you received the following chemotherapy and/or immunotherapy agents: Ruxience      To help prevent nausea and vomiting after your treatment, we encourage you to take your nausea medication as directed.  BELOW ARE SYMPTOMS THAT SHOULD BE REPORTED IMMEDIATELY: *FEVER GREATER THAN 100.4 F (38 C) OR HIGHER *CHILLS OR SWEATING *NAUSEA AND VOMITING THAT IS NOT CONTROLLED WITH YOUR NAUSEA MEDICATION *UNUSUAL SHORTNESS OF BREATH *UNUSUAL BRUISING OR BLEEDING *URINARY PROBLEMS (pain or burning when urinating, or frequent urination) *BOWEL PROBLEMS (unusual diarrhea, constipation, pain near the anus) TENDERNESS IN MOUTH AND THROAT WITH OR WITHOUT PRESENCE OF ULCERS (sore throat, sores in mouth, or a toothache) UNUSUAL RASH, SWELLING OR PAIN  UNUSUAL VAGINAL DISCHARGE OR ITCHING   Items with * indicate a potential emergency and should be followed up as soon as possible or go to the Emergency Department if any problems should occur.  Please show the CHEMOTHERAPY ALERT CARD or IMMUNOTHERAPY ALERT CARD at  check-in to the Emergency Department and triage nurse.  Should you have questions after your visit or need to cancel or reschedule your appointment, please contact [facility]  Dept: [phone]  and follow the prompts.  Office hours are 8:00 a.m. to 4:30 p.m. Monday - Friday. Please note that voicemails left after 4:00 p.m. may not be returned until the following business day.  We are closed weekends and major holidays. You have access to a nurse at all times for urgent questions. Please call the main number to the clinic Dept: [phone] and follow the prompts.   For any non-urgent questions, you may also contact your provider using MyChart. We now offer e-Visits for anyone 18 and older to request care online for non-urgent symptoms. For details visit [email or web address].   Also download the MyChart app! Go to the app store, search "MyChart", open the app, select [facility], and log in with your MyChart username and password.   

## 2022-04-24 NOTE — Progress Notes (Signed)
Pt ok for tx today per Dr Candise Che with HR > 100

## 2022-05-04 ENCOUNTER — Telehealth: Payer: Self-pay | Admitting: Hematology

## 2022-05-06 ENCOUNTER — Other Ambulatory Visit: Payer: Self-pay | Admitting: Hematology and Oncology

## 2022-05-13 NOTE — Discharge Summary (Signed)
Physician Discharge Summary   Patient: Elaine Allen MRN: [id number] DOB: 07/05/1955  Admit date:     03/18/2022  Discharge date: 03/20/2022  Discharge Physician: Fran Lowes   PCP: Hillery Aldo, NP   Recommendations at discharge:    Discharge to home. Follow up with PCP in 7-10 days. CBC to be drawn on that visit and to be reported to Hematology. Follow up with hematology as directed. They will be contacting you with an appointment.  Discharge Diagnoses: Principal Problem:   Immune thrombocytopenia (HCC) Active Problems:   Lupus (systemic lupus erythematosus) (HCC)   Essential hypertension   Leukopenia  Resolved Problems:   * No resolved hospital problems. [facility] Course: Patient is a 67 year old female with history of SLE,  recurrent ITP who presented with complaints of spontaneous bruising on her lower extremities, nosebleeds for 3 to 4 days and went to her PCP for further evaluation.  Blood work done as an outpatient showed platelets less than 5K and was sent to the emergency department.  Found to have severe thrombocytopenia here, hematology following.  Being transfused with a goal of more than 10,000 Platelets.  Also on IVIG and Decadron.  Hematology following. She was deemed appropriate for discharge to home following her second dose of   IVIG on 03/19/2022. She was discharged to home on 03/20/2022.  Assessment and Plan: Principal Problem:   Immune thrombocytopenia (HCC) Active Problems:   Lupus (systemic lupus erythematosus) (HCC)   Essential hypertension   Leukopenia       Recurrent ITP: Presented with lower extremity bruises, nosebleed.  Found to have thrombocytopenia with platelets of less than 5K.  Likely immune mediated in the setting of lupus. Hematology following.  Previously treated with Rituxan Platelets level less than 5K today.  Plan for dexamethasone 40 mg x 4 doses, IVIG 1 g/kg x 2 doses.  Hematology planning for treatment with Rituxan weekly x 4  doses. No evidence of active bleeding. She was transfused with a unit of platelets ordered this morning, platelets improved just to 8000, will repeat another unit of transfusion of platelets.  Continue to transfuse until platelets is at least 10K. Any change in the mental status warrants stat CT head to rule out intracranial bleed.Will transfer to progressive unit.Minimize ambulation until platelets level improve   SLE: Follows with rheumatology, lupus anticoagulant positive.  History of old stroke.  Currently in remission.  Denies any joint pain   Leukopenia/neutropenia: Likely immune mediated secondary to lupus.   History of hypertension:remains hypertensive this mrng. Home medications restarted.  Continue prn  medications for severe hypertension   Hypokalemia: Supplement with potassium   Obesity: BMI of 32.2          Consultants: Hematology/oncology Procedures performed: IVIG infusion  Disposition: Home Diet recommendation:  Discharge Diet Orders (From admission, onward)     Start     Ordered   03/20/22 0000  Diet - low sodium heart healthy        03/20/22 1423           Cardiac diet DISCHARGE MEDICATION: Allergies as of 03/20/2022       Reactions   Plaquenil [hydroxychloroquine Sulfate] Anaphylaxis, Other (See Comments)   Blisters all over body   Amoxicillin Other (See Comments)   Azithromycin Nausea And Vomiting   Lactose Intolerance (gi) Other (See Comments)   Upset stomach   Penicillin G Itching, Nausea Only   Did it involve swelling of the face/tongue/throat, SOB, or low BP? No  No Did it involve sudden or severe rash/hives, skin peeling, or any reaction on the inside of your mouth or nose? Np No Did you need to seek medical attention at a hospital or doctor's office? Yes Yes When did it last happen? around 2010 If all above answers are "NO", may proceed with cephalosporin use.   Hydroxychloroquine Sulfate Other (See Comments), Rash        Medication  List     STOP taking these medications    magnesium 30 MG tablet   mycophenolate 500 MG tablet Commonly known as: CELLCEPT       TAKE these medications    alum & mag hydroxide-simeth 200-200-20 MG/5ML suspension Commonly known as: MAALOX/MYLANTA Take 30 mLs by mouth every 4 (four) hours as needed for indigestion or heartburn.   amLODipine 10 MG tablet Commonly known as: NORVASC Take 10 mg by mouth daily.   cholecalciferol 25 MCG (1000 UNIT) tablet Commonly known as: VITAMIN D3 Take 1,000 Units by mouth daily.   cyanocobalamin 1000 MCG tablet Commonly known as: VITAMIN B12 Take 1,000 mcg by mouth daily.   loratadine 10 MG tablet Commonly known as: CLARITIN Take 1 tablet (10 mg total) by mouth daily.   losartan-hydrochlorothiazide 100-25 MG tablet Commonly known as: HYZAAR Take 1 tablet by mouth daily.   multivitamin with minerals Tabs tablet Take 1 tablet by mouth daily.   PROBIOTIC PO Take 1 capsule by mouth daily.       ASK your doctor about these medications    dexAMETHasone 20 MG Tabs Take 40 mg by mouth daily for 3 days. Ask about: Should I take this medication?        Discharge Exam: Filed Weights   03/18/22 1403  Weight: 82.6 kg   Exam:  Constitutional:  The patient is awake, alert, and oriented x 3. No acute distress. Eyes:  pupils and irises appear normal Normal lids and conjunctivae ENMT:  grossly normal hearing  Lips appear normal external ears, nose appear normal Oropharynx: mucosa, tongue,posterior pharynx appear normal Neck:  neck appears normal, no masses, normal ROM, supple no thyromegaly Respiratory:  No increased work of breathing. No wheezes, rales, or rhonchi No tactile fremitus Cardiovascular:  Regular rate and rhythm No murmurs, ectopy, or gallups. No lateral PMI. No thrills. Abdomen:  Abdomen is soft, non-tender, non-distended No hernias, masses, or organomegaly Normoactive bowel sounds.  Musculoskeletal:   No cyanosis, clubbing, or edema Skin:  No rashes, lesions, ulcers palpation of skin: no induration or nodules Neurologic:  CN 2-12 intact Sensation all 4 extremities intact Psychiatric:  Mental status Mood, affect appropriate Orientation to person, place, time  judgment and insight appear intact   Condition at discharge: fair  The results of significant diagnostics from this hospitalization (including imaging, microbiology, ancillary and laboratory) are listed below for reference.   Imaging Studies: No results found.  Microbiology: Results for orders placed or performed during the hospital encounter of 07/12/20  Resp Panel by RT-PCR (Flu A&B, Covid) Nasopharyngeal Swab     Status: None   Collection Time: 07/12/20  9:39 AM   Specimen: Nasopharyngeal Swab; Nasopharyngeal(NP) swabs in vial transport medium  Result Value Ref Range Status   SARS Coronavirus 2 by RT PCR NEGATIVE NEGATIVE Final    Comment: (NOTE) SARS-CoV-2 target nucleic acids are NOT DETECTED.  The SARS-CoV-2 RNA is generally detectable in upper respiratory specimens during the acute phase of infection. The lowest concentration of SARS-CoV-2 viral copies this assay can detect is 138 copies/mL.  A negative result does not preclude SARS-Cov-2 infection and should not be used as the sole basis for treatment or other patient management decisions. A negative result may occur with  improper specimen collection/handling, submission of specimen other than nasopharyngeal swab, presence of viral mutation(s) within the areas targeted by this assay, and inadequate number of viral copies(<138 copies/mL). A negative result must be combined with clinical observations, patient history, and epidemiological information. The expected result is Negative.  Fact Sheet for Patients:  BloggerCourse.com  Fact Sheet for Healthcare Providers:  SeriousBroker.it  This test is no t yet  approved or cleared by the [place] FDA and  has been authorized for detection and/or diagnosis of SARS-CoV-2 by FDA under an Emergency Use Authorization (EUA). This EUA will remain  in effect (meaning this test can be used) for the duration of the COVID-19 declaration under Section 564(b)(1) of the Act, 21 U.S.C.section 360bbb-3(b)(1), unless the authorization is terminated  or revoked sooner.       Influenza A by PCR NEGATIVE NEGATIVE Final   Influenza B by PCR NEGATIVE NEGATIVE Final    Comment: (NOTE) The Xpert Xpress SARS-CoV-2/FLU/RSV plus assay is intended as an aid in the diagnosis of influenza from Nasopharyngeal swab specimens and should not be used as a sole basis for treatment. Nasal washings and aspirates are unacceptable for Xpert Xpress SARS-CoV-2/FLU/RSV testing.  Fact Sheet for Patients: BloggerCourse.com  Fact Sheet for Healthcare Providers: SeriousBroker.it  This test is not yet approved or cleared by the [place] FDA and has been authorized for detection and/or diagnosis of SARS-CoV-2 by FDA under an Emergency Use Authorization (EUA). This EUA will remain in effect (meaning this test can be used) for the duration of the COVID-19 declaration under Section 564(b)(1) of the Act, 21 U.S.C. section 360bbb-3(b)(1), unless the authorization is terminated or revoked.  Performed at [facility], [address] [place], [place] [address]     Labs: CBC: No results for input(s): "WBC", "NEUTROABS", "HGB", "HCT", "MCV", "PLT" in the last 168 hours. Basic Metabolic Panel: No results for input(s): "NA", "K", "CL", "CO2", "GLUCOSE", "BUN", "CREATININE", "CALCIUM", "MG", "PHOS" in the last 168 hours. Liver Function Tests: No results for input(s): "AST", "ALT", "ALKPHOS", "BILITOT", "PROT", "ALBUMIN" in the last 168 hours. CBG: No results for input(s): "GLUCAP" in the last 168  hours.  Discharge time spent: greater than 30 minutes.  Signed: Tomorrow Dehaas, DO [facility] 05/13/2022

## 2022-06-19 ENCOUNTER — Other Ambulatory Visit: Payer: Self-pay

## 2022-06-19 DIAGNOSIS — D693 Immune thrombocytopenic purpura: Secondary | ICD-10-CM

## 2022-06-22 ENCOUNTER — Inpatient Hospital Stay: Payer: Medicare Other | Admitting: Hematology

## 2022-06-22 ENCOUNTER — Inpatient Hospital Stay: Payer: Medicare Other

## 2022-06-24 ENCOUNTER — Telehealth: Payer: Self-pay | Admitting: Hematology

## 2022-06-30 ENCOUNTER — Inpatient Hospital Stay: Payer: Medicare Other | Attending: Hematology

## 2022-06-30 ENCOUNTER — Other Ambulatory Visit: Payer: Self-pay

## 2022-06-30 ENCOUNTER — Inpatient Hospital Stay: Payer: Medicare Other | Admitting: Hematology

## 2022-06-30 VITALS — BP 154/83 | HR 87 | Temp 100.1°F | Resp 20 | Wt 183.7 lb

## 2022-06-30 DIAGNOSIS — D693 Immune thrombocytopenic purpura: Secondary | ICD-10-CM | POA: Diagnosis present

## 2022-06-30 LAB — CBC WITH DIFFERENTIAL (CANCER CENTER ONLY)
Abs Immature Granulocytes: 0.01 10*3/uL (ref 0.00–0.07)
Basophils Absolute: 0 10*3/uL (ref 0.0–0.1)
Basophils Relative: 1 %
Eosinophils Absolute: 0.1 10*3/uL (ref 0.0–0.5)
Eosinophils Relative: 4 %
HCT: 40 % (ref 36.0–46.0)
Hemoglobin: 13.5 g/dL (ref 12.0–15.0)
Immature Granulocytes: 1 %
Lymphocytes Relative: 44 %
Lymphs Abs: 1 10*3/uL (ref 0.7–4.0)
MCH: 30.1 pg (ref 26.0–34.0)
MCHC: 33.8 g/dL (ref 30.0–36.0)
MCV: 89.1 fL (ref 80.0–100.0)
Monocytes Absolute: 0.3 10*3/uL (ref 0.1–1.0)
Monocytes Relative: 15 %
Neutro Abs: 0.8 10*3/uL — ABNORMAL LOW (ref 1.7–7.7)
Neutrophils Relative %: 35 %
Platelet Count: 187 10*3/uL (ref 150–400)
RBC: 4.49 MIL/uL (ref 3.87–5.11)
RDW: 13.7 % (ref 11.5–15.5)
WBC Count: 2.2 10*3/uL — ABNORMAL LOW (ref 4.0–10.5)
nRBC: 0 % (ref 0.0–0.2)

## 2022-06-30 LAB — CMP (CANCER CENTER ONLY)
ALT: 14 U/L (ref 0–44)
AST: 20 U/L (ref 15–41)
Albumin: 4 g/dL (ref 3.5–5.0)
Alkaline Phosphatase: 104 U/L (ref 38–126)
Anion gap: 6 (ref 5–15)
BUN: 15 mg/dL (ref 8–23)
CO2: 30 mmol/L (ref 22–32)
Calcium: 9.6 mg/dL (ref 8.9–10.3)
Chloride: 107 mmol/L (ref 98–111)
Creatinine: 0.83 mg/dL (ref 0.44–1.00)
GFR, Estimated: >60 mL/min (ref >60)
Glucose, Bld: 100 mg/dL — ABNORMAL HIGH (ref 70–99)
Potassium: 3.6 mmol/L (ref 3.5–5.1)
Sodium: 143 mmol/L (ref 135–145)
Total Bilirubin: 0.7 mg/dL (ref 0.3–1.2)
Total Protein: 7.2 g/dL (ref 6.5–8.1)

## 2022-06-30 NOTE — Progress Notes (Signed)
HEMATOLOGY/ONCOLOGY CLINIC VISIT NOTE  Date of Service: 06/30/22   Patient Care Team: Hillery Aldo, NP as PCP - General Candise Che Corene Cornea, MD as Consulting Physician (Hematology)  REFERRING PHYSICIAN: Hillery Aldo, NP  CHIEF COMPLAINTS/PURPOSE OF CONSULTATION:  Follow-up for continued evaluation and management of ITP related to SLE  INTERVAL HISTORY  Elaine Allen is a 67 y.o. female here for continued evaluation and management of ITP related to SLE. Patient was last seen by me on 04/17/2022 and complained of brain fog/confusion and occasional chills.   Today, she reports that she has been doing well overall over the last 3 months. Patient denies any bleeding issues, infection issues, back pain, painful swollen joints, skin rashes, or abdominal pain. She does complain of somewhat worsened fatigue related to lupus.   MEDICAL HISTORY:  Past Medical History:  Diagnosis Date   Arthritis    Blind right eye    sees colors   Chronic anemia    Detached retina, right    corrected good for about 6 months sees peripheral colors only   History of kidney stones    Hypertension    Hyperthyroidism    Lupus (HCC)    Pneumonia    Retinal detachment    Spasmodic dysphonia    Stroke (HCC) 2015   no permanant defecits had weakness in left arm in hospital Had PT also   Vertigo    Hx of preceding stroke     SURGICAL HISTORY: Past Surgical History:  Procedure Laterality Date   ABDOMINAL HYSTERECTOMY     BREAST EXCISIONAL BIOPSY Left    KNEE SURGERY Left    THYROID SURGERY     TOTAL HIP ARTHROPLASTY Left 06/27/2018   Procedure: TOTAL HIP ARTHROPLASTY ANTERIOR APPROACH;  Surgeon: Gean Birchwood, MD;  Location: [facility];  Service: Orthopedics;  Laterality: Left;     SOCIAL HISTORY: Social History   Socioeconomic History   Marital status: Single    Spouse name: Not on file   Number of children: 2   Years of education: college   Highest education level: Not on file   Occupational History   Occupation: options  Tobacco Use   Smoking status: Never    Passive exposure: Never   Smokeless tobacco: Never  Vaping Use   Vaping Use: Never used  Substance and Sexual Activity   Alcohol use: No   Drug use: No   Sexual activity: Not Currently  Other Topics Concern   Not on file  Social History Narrative   Not on file   Social Determinants of Health   Financial Resource Strain: Low Risk  (08/13/2020)   Overall Financial Resource Strain (CARDIA)    Difficulty of Paying Living Expenses: Not hard at all  Food Insecurity: No Food Insecurity (03/19/2022)   Hunger Vital Sign    Worried About Running Out of Food in the Last Year: Never true    Ran Out of Food in the Last Year: Never true  Transportation Needs: No Transportation Needs (03/19/2022)   PRAPARE - Administrator, Civil Service (Medical): No    Lack of Transportation (Non-Medical): No  Physical Activity: Sufficiently Active (08/13/2020)   Exercise Vital Sign    Days of Exercise per Week: 5 days    Minutes of Exercise per Session: 30 min  Stress: No Stress Concern Present (08/13/2020)   [employer] of Occupational Health - Occupational Stress Questionnaire    Feeling of Stress : Not at all  Social  Connections: Moderately Integrated (08/13/2020)   Social Connection and Isolation Panel [NHANES]    Frequency of Communication with Friends and Family: More than three times a week    Frequency of Social Gatherings with Friends and Family: More than three times a week    Attends Religious Services: More than 4 times per year    Active Member of [employer] or Organizations: Yes    Attends Engineer, structural: More than 4 times per year    Marital Status: Divorced  Intimate Partner Violence: Not At Risk (03/19/2022)   Humiliation, Afraid, Rape, and Kick questionnaire    Fear of Current or Ex-Partner: No    Emotionally Abused: No    Physically Abused: No    Sexually Abused: No      FAMILY HISTORY: Family History  Problem Relation Age of Onset   Cancer Mother        liver   Diabetes Mother    Arthritis Mother    Cancer Father        prostate,METS, stomach    Ovarian cancer Sister    Throat cancer Brother      ALLERGIES:   is allergic to plaquenil [hydroxychloroquine sulfate], amoxicillin, azithromycin, lactose intolerance (gi), penicillin g, and hydroxychloroquine sulfate.   MEDICATIONS:  Current Outpatient Medications  Medication Sig Dispense Refill   alum & mag hydroxide-simeth (MAALOX/MYLANTA) 200-200-20 MG/5ML suspension Take 30 mLs by mouth every 4 (four) hours as needed for indigestion or heartburn. 355 mL 0   amLODipine (NORVASC) 10 MG tablet Take 10 mg by mouth daily. (Patient not taking: Reported on 04/17/2022)     cholecalciferol (VITAMIN D3) 25 MCG (1000 UNIT) tablet Take 1,000 Units by mouth daily.     loratadine (CLARITIN) 10 MG tablet Take 1 tablet (10 mg total) by mouth daily. 30 tablet 0   losartan-hydrochlorothiazide (HYZAAR) 100-25 MG tablet Take 1 tablet by mouth daily. 30 tablet 0   Multiple Vitamin (MULTIVITAMIN WITH MINERALS) TABS tablet Take 1 tablet by mouth daily.     Probiotic Product (PROBIOTIC PO) Take 1 capsule by mouth daily.     vitamin B-12 (CYANOCOBALAMIN) 1000 MCG tablet Take 1,000 mcg by mouth daily.     No current facility-administered medications for this visit.    REVIEW OF SYSTEMS:    10 Point review of Systems was done is negative except as noted above.   PHYSICAL EXAMINATION: .BP (!) 154/83   Pulse 87   Temp 100.1 F (37.8 C)   Resp 20   Wt 183 lb 11.2 oz (83.3 kg)   SpO2 100%   BMI 32.54 kg/m   GENERAL:alert, in no acute distress and comfortable SKIN: no acute rashes, no significant lesions EYES: conjunctiva are pink and non-injected, sclera anicteric OROPHARYNX: MMM, no exudates, no oropharyngeal erythema or ulceration NECK: supple, no JVD LYMPH:  no palpable lymphadenopathy in the cervical,  axillary or inguinal regions LUNGS: clear to auscultation b/l with normal respiratory effort HEART: regular rate & rhythm ABDOMEN:  normoactive bowel sounds , non tender, not distended. Extremity: no pedal edema PSYCH: alert & oriented x 3 with fluent speech NEURO: no focal motor/sensory deficits   LABORATORY DATA:  I have reviewed the data as listed     Latest Ref Rng & Units 06/30/2022    1:47 PM 04/24/2022    1:40 PM 04/17/2022    1:05 PM  CBC  WBC 4.0 - 10.5 K/uL 2.2  3.8  4.2   Hemoglobin 12.0 - 15.0 g/dL 13.5  13.1  12.2   Hematocrit 36.0 - 46.0 % 40.0  38.7  36.4   Platelets 150 - 400 K/uL 187  239  192   ANC 800     Latest Ref Rng & Units 06/30/2022    1:47 PM 04/24/2022    1:40 PM 04/17/2022    1:05 PM  CMP  Glucose 70 - 99 mg/dL 161  096  045   BUN 8 - 23 mg/dL 15  12  11    Creatinine 0.44 - 1.00 mg/dL 4.09  8.11  9.14   Sodium 135 - 145 mmol/L 143  140  141   Potassium 3.5 - 5.1 mmol/L 3.6  3.4  3.7   Chloride 98 - 111 mmol/L 107  107  110   CO2 22 - 32 mmol/L 30  28  26    Calcium 8.9 - 10.3 mg/dL 9.6  9.7  9.5   Total Protein 6.5 - 8.1 g/dL 7.2  7.1  6.6   Total Bilirubin 0.3 - 1.2 mg/dL 0.7  0.6  0.6   Alkaline Phos 38 - 126 U/L 104  91  82   AST 15 - 41 U/L 20  17  15    ALT 0 - 44 U/L 14  11  9     RADIOGRAPHIC STUDIES: I have personally reviewed the radiological images as listed and agreed with the findings in the report. No results found.  ASSESSMENT & PLAN:   Keiva Truszkowski is a 67 y.o. female with:  1.  H/o Relapsing ITP due to lupus -Likely related to Immune thrombocytopenia in the setting of lupus. Patient was treated for her relapsed ITP with 4 doses of Rituxan again.  2. Lupus -being followed by Dr. Corliss Skains and now is transferring care to [facility]  3. H/o AVN -  related to steroids s/p left THA.  4. Lupus anticoagulant positive-history of old stroke but that was related to vertebral artery dissection. -Reasonable to  consider baby aspirin 81 mg p.o. daily.  Will defer this to her primary care physician.  5.  Leukopenia/neutropenia -likely autoimmune related to lupus.   Plan:  -Discussed lab results on 06/30/2022 in detail with patient. CBC showed WBC slightly low at 2.18K, hemoglobin of 13.5, and platelets of normal at 187K.  -slightly low WBC level may be due to Lupus -there is some neutropenia present -CMP normal -patient last received Rituxan for maintenance on 04/24/2022 -Pt shall receive 2 doses of Rituxan in October 2024, and continue to receive 2 doses every 6 months for rheumatoid arthritis -continue to follow with rheumatology regularly for consideration of urinalysis and optimal management of lupus activity -will continue to monitor in 3-4 months  FOLLOW UP: RTC with Dr Candise Che with labs in 3 months  The total time spent in the appointment was 21 minutes* .  All of the patient's questions were answered with apparent satisfaction. The patient knows to call the clinic with any problems, questions or concerns.   Wyvonnia Lora MD MS AAHIVMS [facility] Hematology/Oncology Physician [facility]  .*Total Encounter Time as defined by the Centers for Medicare and Medicaid Services includes, in addition to the face-to-face time of a patient visit (documented in the note above) non-face-to-face time: obtaining and reviewing outside history, ordering and reviewing medications, tests or procedures, care coordination (communications with other health care professionals or caregivers) and documentation in the medical record.    I,Mitra Faeizi,acting as a Neurosurgeon for Wyvonnia Lora, MD.,have documented all relevant  documentation on the behalf of Wyvonnia Lora, MD,as directed by  Wyvonnia Lora, MD while in the presence of Wyvonnia Lora, MD.  .I have reviewed the above documentation for accuracy and completeness, and I agree with the above. Johney Maine MD

## 2022-07-01 ENCOUNTER — Telehealth: Payer: Self-pay | Admitting: Hematology

## 2022-10-01 ENCOUNTER — Other Ambulatory Visit: Payer: Self-pay

## 2022-10-01 DIAGNOSIS — D693 Immune thrombocytopenic purpura: Secondary | ICD-10-CM

## 2022-10-02 ENCOUNTER — Inpatient Hospital Stay: Payer: Medicare HMO | Attending: Hematology

## 2022-10-02 ENCOUNTER — Inpatient Hospital Stay: Payer: Medicare HMO | Admitting: Hematology

## 2022-10-06 ENCOUNTER — Telehealth: Payer: Self-pay | Admitting: Hematology

## 2022-10-06 NOTE — Telephone Encounter (Signed)
Left patient a message regarding missed appointment and also left callback number when ready to reschedule appointment missed from 10/02/2022

## 2022-10-10 ENCOUNTER — Telehealth: Payer: Self-pay | Admitting: Hematology

## 2022-10-16 ENCOUNTER — Inpatient Hospital Stay (HOSPITAL_BASED_OUTPATIENT_CLINIC_OR_DEPARTMENT_OTHER): Payer: Medicare HMO | Admitting: Hematology

## 2022-10-16 ENCOUNTER — Inpatient Hospital Stay: Payer: Medicare HMO | Attending: Hematology

## 2022-10-16 VITALS — BP 119/65 | HR 69 | Temp 97.5°F | Resp 18 | Wt 180.2 lb

## 2022-10-16 DIAGNOSIS — Z79899 Other long term (current) drug therapy: Secondary | ICD-10-CM | POA: Diagnosis not present

## 2022-10-16 DIAGNOSIS — D649 Anemia, unspecified: Secondary | ICD-10-CM | POA: Diagnosis not present

## 2022-10-16 DIAGNOSIS — D6862 Lupus anticoagulant syndrome: Secondary | ICD-10-CM | POA: Diagnosis not present

## 2022-10-16 DIAGNOSIS — Z8673 Personal history of transient ischemic attack (TIA), and cerebral infarction without residual deficits: Secondary | ICD-10-CM | POA: Insufficient documentation

## 2022-10-16 DIAGNOSIS — D693 Immune thrombocytopenic purpura: Secondary | ICD-10-CM | POA: Diagnosis present

## 2022-10-16 LAB — CBC WITH DIFFERENTIAL (CANCER CENTER ONLY)
Abs Immature Granulocytes: 0.01 10*3/uL (ref 0.00–0.07)
Basophils Absolute: 0 10*3/uL (ref 0.0–0.1)
Basophils Relative: 0 %
Eosinophils Absolute: 0.1 10*3/uL (ref 0.0–0.5)
Eosinophils Relative: 4 %
HCT: 35.3 % — ABNORMAL LOW (ref 36.0–46.0)
Hemoglobin: 11.8 g/dL — ABNORMAL LOW (ref 12.0–15.0)
Immature Granulocytes: 0 %
Lymphocytes Relative: 26 %
Lymphs Abs: 0.7 10*3/uL (ref 0.7–4.0)
MCH: 29.8 pg (ref 26.0–34.0)
MCHC: 33.4 g/dL (ref 30.0–36.0)
MCV: 89.1 fL (ref 80.0–100.0)
Monocytes Absolute: 0.4 10*3/uL (ref 0.1–1.0)
Monocytes Relative: 16 %
Neutro Abs: 1.4 10*3/uL — ABNORMAL LOW (ref 1.7–7.7)
Neutrophils Relative %: 54 %
Platelet Count: 182 10*3/uL (ref 150–400)
RBC: 3.96 MIL/uL (ref 3.87–5.11)
RDW: 14.3 % (ref 11.5–15.5)
WBC Count: 2.6 10*3/uL — ABNORMAL LOW (ref 4.0–10.5)
nRBC: 0 % (ref 0.0–0.2)

## 2022-10-16 LAB — CMP (CANCER CENTER ONLY)
ALT: 14 U/L (ref 0–44)
AST: 24 U/L (ref 15–41)
Albumin: 4 g/dL (ref 3.5–5.0)
Alkaline Phosphatase: 98 U/L (ref 38–126)
Anion gap: 4 — ABNORMAL LOW (ref 5–15)
BUN: 13 mg/dL (ref 8–23)
CO2: 31 mmol/L (ref 22–32)
Calcium: 9.3 mg/dL (ref 8.9–10.3)
Chloride: 108 mmol/L (ref 98–111)
Creatinine: 0.93 mg/dL (ref 0.44–1.00)
GFR, Estimated: >60 mL/min (ref >60)
Glucose, Bld: 95 mg/dL (ref 70–99)
Potassium: 3.2 mmol/L — ABNORMAL LOW (ref 3.5–5.1)
Sodium: 143 mmol/L (ref 135–145)
Total Bilirubin: 0.9 mg/dL (ref 0.3–1.2)
Total Protein: 6.6 g/dL (ref 6.5–8.1)

## 2022-10-16 MED ORDER — POTASSIUM CHLORIDE CRYS ER 20 MEQ PO TBCR: 20.0000 meq | EXTENDED_RELEASE_TABLET | Freq: Every day | ORAL | 0 refills | Status: DC

## 2022-10-16 NOTE — Progress Notes (Signed)
HEMATOLOGY/ONCOLOGY CLINIC VISIT NOTE  Date of Service: 10/16/22   Patient Care Team: Hillery Aldo, NP as PCP - General Candise Che Corene Cornea, MD as Consulting Physician (Hematology)  REFERRING PHYSICIAN: Hillery Aldo, NP  CHIEF COMPLAINTS/PURPOSE OF CONSULTATION:  Follow-up for continued evaluation and management of ITP related to SLE  INTERVAL HISTORY  Elaine Allen is a 67 y.o. female here for continued evaluation and management of ITP related to SLE. Patient was last seen by me on 06/30/2022 and reported somewhat worsened fatigue related to lupus.   Today, she reports that she recently presented to the hospital for transient ischaemic attack and was told that her atrial flutter had lead to Paroxysmal atrial fibrillation. Patient reports that her fibrillation was coming and going and was not fixed. She is currently on full-dose blood thinners at this time. She reports that she does not feel when she goes into AFib/flutter.   Patient was seen at [facility] after endorsing numbness on her whole left side. She notes that her foot was completely numb and she could not feel it. Her numbness had resolved for some time, but restarted again and she had presented to the ED. At that time, she did experience numbness in her foot and her hands her cold and mildly numb.   She does not have a cardiologist at this time and is currently in the process of establishing care with one with her PCP. She was last seen by a rheumatologist more than 6 months ago.   Patient was seen by dermatology for a mole in her left-sided chest. She received a biopsy of the area but has not yet obtained the results.   She continues to have sleep difficulties despite trying two medications. Patient denies any bleeding issues, black stools, painful swollen joints, or back pain.   Patient complains of body stiffness. She also reports having a rash behind her left ear present for x3 weeks. She reports that the rash was  initially red/scaly prior to turning darker. Her rash is itchy at this time. She denies any contact with poison ivy, denies any new cosmetic use, and does not have any pets. She did previously apply antifungal cream and OTC anti-itch cream. Patient denies applying any steroid cream to the area.   Patient is on diuretics at this time and she does take magnesium regularly.   She reports that she is UTD with her age-appropriate vaccines including influenza, COVID-19, and pneumonia vaccines.   MEDICAL HISTORY:  Past Medical History:  Diagnosis Date   Arthritis    Blind right eye    sees colors   Chronic anemia    Detached retina, right    corrected good for about 6 months sees peripheral colors only   History of kidney stones    Hypertension    Hyperthyroidism    Lupus (HCC)    Pneumonia    Retinal detachment    Spasmodic dysphonia    Stroke (HCC) 2015   no permanant defecits had weakness in left arm in hospital Had PT also   Vertigo    Hx of preceding stroke     SURGICAL HISTORY: Past Surgical History:  Procedure Laterality Date   ABDOMINAL HYSTERECTOMY     BREAST EXCISIONAL BIOPSY Left    KNEE SURGERY Left    THYROID SURGERY     TOTAL HIP ARTHROPLASTY Left 06/27/2018   Procedure: TOTAL HIP ARTHROPLASTY ANTERIOR APPROACH;  Surgeon: Gean Birchwood, MD;  Location: [facility];  Service: Orthopedics;  Laterality: Left;     SOCIAL HISTORY: Social History   Socioeconomic History   Marital status: Single    Spouse name: Not on file   Number of children: 2   Years of education: college   Highest education level: Not on file  Occupational History   Occupation: options  Tobacco Use   Smoking status: Never    Passive exposure: Never   Smokeless tobacco: Never  Vaping Use   Vaping status: Never Used  Substance and Sexual Activity   Alcohol use: No   Drug use: No   Sexual activity: Not Currently  Other Topics Concern   Not on file  Social History Narrative   Not on file    Social Determinants of Health   Financial Resource Strain: Low Risk  (08/13/2020)   Overall Financial Resource Strain (CARDIA)    Difficulty of Paying Living Expenses: Not hard at all  Food Insecurity: No Food Insecurity (03/19/2022)   Hunger Vital Sign    Worried About Running Out of Food in the Last Year: Never true    Ran Out of Food in the Last Year: Never true  Transportation Needs: No Transportation Needs (03/19/2022)   PRAPARE - Administrator, Civil Service (Medical): No    Lack of Transportation (Non-Medical): No  Physical Activity: Sufficiently Active (08/13/2020)   Exercise Vital Sign    Days of Exercise per Week: 5 days    Minutes of Exercise per Session: 30 min  Stress: No Stress Concern Present (08/13/2020)   [employer] of Occupational Health - Occupational Stress Questionnaire    Feeling of Stress : Not at all  Social Connections: Moderately Integrated (08/13/2020)   Social Connection and Isolation Panel [NHANES]    Frequency of Communication with Friends and Family: More than three times a week    Frequency of Social Gatherings with Friends and Family: More than three times a week    Attends Religious Services: More than 4 times per year    Active Member of [employer] or Organizations: Yes    Attends Engineer, structural: More than 4 times per year    Marital Status: Divorced  Intimate Partner Violence: Not At Risk (03/19/2022)   Humiliation, Afraid, Rape, and Kick questionnaire    Fear of Current or Ex-Partner: No    Emotionally Abused: No    Physically Abused: No    Sexually Abused: No     FAMILY HISTORY: Family History  Problem Relation Age of Onset   Cancer Mother        liver   Diabetes Mother    Arthritis Mother    Cancer Father        prostate,METS, stomach    Ovarian cancer Sister    Throat cancer Brother      ALLERGIES:   is allergic to plaquenil [hydroxychloroquine sulfate], amoxicillin, azithromycin, lactose intolerance  (gi), penicillin g, and hydroxychloroquine sulfate.   MEDICATIONS:  Current Outpatient Medications  Medication Sig Dispense Refill   potassium chloride SA (KLOR-CON M) 20 MEQ tablet Take 1 tablet (20 mEq total) by mouth daily. 30 tablet 0   alum & mag hydroxide-simeth (MAALOX/MYLANTA) 200-200-20 MG/5ML suspension Take 30 mLs by mouth every 4 (four) hours as needed for indigestion or heartburn. 355 mL 0   amLODipine (NORVASC) 10 MG tablet Take 10 mg by mouth daily. (Patient not taking: Reported on 04/17/2022)     cholecalciferol (VITAMIN D3) 25 MCG (1000 UNIT) tablet Take 1,000 Units by mouth daily.  loratadine (CLARITIN) 10 MG tablet Take 1 tablet (10 mg total) by mouth daily. 30 tablet 0   losartan-hydrochlorothiazide (HYZAAR) 100-25 MG tablet Take 1 tablet by mouth daily. 30 tablet 0   Multiple Vitamin (MULTIVITAMIN WITH MINERALS) TABS tablet Take 1 tablet by mouth daily.     Probiotic Product (PROBIOTIC PO) Take 1 capsule by mouth daily.     vitamin B-12 (CYANOCOBALAMIN) 1000 MCG tablet Take 1,000 mcg by mouth daily.     No current facility-administered medications for this visit.    REVIEW OF SYSTEMS:    10 Point review of Systems was done is negative except as noted above.   PHYSICAL EXAMINATION: .BP 119/65   Pulse 69   Temp (!) 97.5 F (36.4 C)   Resp 18   Wt 180 lb 3.2 oz (81.7 kg)   SpO2 100%   BMI 31.92 kg/m    GENERAL:alert, in no acute distress and comfortable SKIN: no acute rashes, no significant lesions EYES: conjunctiva are pink and non-injected, sclera anicteric OROPHARYNX: MMM, no exudates, no oropharyngeal erythema or ulceration NECK: supple, no JVD LYMPH:  no palpable lymphadenopathy in the cervical, axillary or inguinal regions LUNGS: clear to auscultation b/l with normal respiratory effort HEART: regular rate & rhythm ABDOMEN:  normoactive bowel sounds , non tender, not distended. Extremity: no pedal edema PSYCH: alert & oriented x 3 with fluent  speech NEURO: no focal motor/sensory deficits   LABORATORY DATA:  I have reviewed the data as listed     Latest Ref Rng & Units 10/16/2022    1:57 PM 06/30/2022    1:47 PM 04/24/2022    1:40 PM  CBC  WBC 4.0 - 10.5 K/uL 2.6  2.2  3.8   Hemoglobin 12.0 - 15.0 g/dL 78.2  95.6  21.3   Hematocrit 36.0 - 46.0 % 35.3  40.0  38.7   Platelets 150 - 400 K/uL 182  187  239   ANC 800     Latest Ref Rng & Units 10/16/2022    1:57 PM 06/30/2022    1:47 PM 04/24/2022    1:40 PM  CMP  Glucose 70 - 99 mg/dL 95  086  578   BUN 8 - 23 mg/dL 13  15  12    Creatinine 0.44 - 1.00 mg/dL 4.69  6.29  5.28   Sodium 135 - 145 mmol/L 143  143  140   Potassium 3.5 - 5.1 mmol/L 3.2  3.6  3.4   Chloride 98 - 111 mmol/L 108  107  107   CO2 22 - 32 mmol/L 31  30  28    Calcium 8.9 - 10.3 mg/dL 9.3  9.6  9.7   Total Protein 6.5 - 8.1 g/dL 6.6  7.2  7.1   Total Bilirubin 0.3 - 1.2 mg/dL 0.9  0.7  0.6   Alkaline Phos 38 - 126 U/L 98  104  91   AST 15 - 41 U/L 24  20  17    ALT 0 - 44 U/L 14  14  11     RADIOGRAPHIC STUDIES: I have personally reviewed the radiological images as listed and agreed with the findings in the report. No results found.  ASSESSMENT & PLAN:   Hanadi Stanly is a 67 y.o. female with:  1.  H/o Relapsing ITP due to lupus -Likely related to Immune thrombocytopenia in the setting of lupus. Patient was treated for her relapsed ITP with 4 doses of Rituxan again.  2. Lupus -being followed  by Dr. Corliss Skains and now is transferring care to [facility]  3. H/o AVN -  related to steroids s/p left THA.  4. Lupus anticoagulant positive-history of old stroke but that was related to vertebral artery dissection. -Reasonable to consider baby aspirin 81 mg p.o. daily.  Will defer this to her primary care physician.  5.  Leukopenia/neutropenia -likely autoimmune related to lupus.   Plan:  -Discussed lab results on 10/16/22 in detail with patient. CBC showed WBC of 2.6K, hemoglobin  of 11.8, and platelets of 182K. -platelets normal -mild anemia -WBCs did improve, but continue to be mildly low -neutrophils improved -CMP shows low potassium at 3.2 mmol/L. Patient is taking diuretics at this time.  -educated patient that low potassium can trigger atrial fibrillation -recommend patient to connect with PCP to consider potassium replacement -rash in left ear likely has post-inflammatory increased pigmentation and may be related to lupus. Dicussed option of using OTC cortisone to improve rash. -patient last received Rituxan for maintenance on April 2024. She is due for her next two doses of Rituxan at this time and then continue to receive 2 weekly doses every 6 months  -will plan for labs in 3 months after her next two doses of Rituxan -advised patient to connect with rheumatology for consideration of urinalysis, inflammatory markers, antibody titer, and any other labs that would be necessary -advised patient to connect with PCP to follow up on cardiology referral for management of her atrial flutter/fibrillation. Discussed that if she has frequent episodes of atrial fibrillation or flutter, there may be a role for an ablation -patient shall connect with dermatology office to obtain biopsy results of mole in her left chest -continue to stay UTD with age-appropriate vaccinations  FOLLOW UP: Plz schedule for Rituxan every 2 weeks x 2 doses with labs  RTC with Dr Candise Che with labs in 3 months  The total time spent in the appointment was 32 minutes* .  All of the patient's questions were answered with apparent satisfaction. The patient knows to call the clinic with any problems, questions or concerns.   Wyvonnia Lora MD MS AAHIVMS [facility] Hematology/Oncology Physician [facility]  .*Total Encounter Time as defined by the Centers for Medicare and Medicaid Services includes, in addition to the face-to-face time of a patient visit (documented in the note above)  non-face-to-face time: obtaining and reviewing outside history, ordering and reviewing medications, tests or procedures, care coordination (communications with other health care professionals or caregivers) and documentation in the medical record.    I,Mitra Faeizi,acting as a Neurosurgeon for Wyvonnia Lora, MD.,have documented all relevant documentation on the behalf of Wyvonnia Lora, MD,as directed by  Wyvonnia Lora, MD while in the presence of Wyvonnia Lora, MD.  .I have reviewed the above documentation for accuracy and completeness, and I agree with the above. Johney Maine MD

## 2022-10-21 ENCOUNTER — Encounter: Payer: Self-pay | Admitting: Hematology

## 2022-10-28 ENCOUNTER — Other Ambulatory Visit: Payer: Self-pay

## 2022-10-28 DIAGNOSIS — D693 Immune thrombocytopenic purpura: Secondary | ICD-10-CM

## 2022-10-30 ENCOUNTER — Inpatient Hospital Stay: Payer: Medicare HMO

## 2022-10-30 ENCOUNTER — Ambulatory Visit: Payer: Medicare HMO | Admitting: Hematology

## 2022-10-30 ENCOUNTER — Ambulatory Visit: Payer: Medicare HMO

## 2022-11-11 ENCOUNTER — Inpatient Hospital Stay: Payer: Medicare HMO

## 2022-11-11 ENCOUNTER — Ambulatory Visit: Payer: Medicare HMO | Admitting: Hematology

## 2022-11-13 ENCOUNTER — Inpatient Hospital Stay: Payer: Medicare HMO | Attending: Hematology

## 2022-11-13 ENCOUNTER — Inpatient Hospital Stay: Payer: Medicare HMO | Admitting: Hematology

## 2022-11-13 ENCOUNTER — Inpatient Hospital Stay: Payer: Medicare HMO

## 2022-11-13 VITALS — BP 143/85 | HR 77 | Temp 98.1°F | Resp 18

## 2022-11-13 DIAGNOSIS — D693 Immune thrombocytopenic purpura: Secondary | ICD-10-CM | POA: Insufficient documentation

## 2022-11-13 DIAGNOSIS — Z5112 Encounter for antineoplastic immunotherapy: Secondary | ICD-10-CM | POA: Diagnosis present

## 2022-11-13 LAB — CBC WITH DIFFERENTIAL (CANCER CENTER ONLY)
Abs Immature Granulocytes: 0.01 K/uL (ref 0.00–0.07)
Basophils Absolute: 0 K/uL (ref 0.0–0.1)
Basophils Relative: 0 %
Eosinophils Absolute: 0.1 K/uL (ref 0.0–0.5)
Eosinophils Relative: 4 %
HCT: 36.3 % (ref 36.0–46.0)
Hemoglobin: 12.2 g/dL (ref 12.0–15.0)
Immature Granulocytes: 0 %
Lymphocytes Relative: 30 %
Lymphs Abs: 0.8 K/uL (ref 0.7–4.0)
MCH: 29.5 pg (ref 26.0–34.0)
MCHC: 33.6 g/dL (ref 30.0–36.0)
MCV: 87.9 fL (ref 80.0–100.0)
Monocytes Absolute: 0.5 K/uL (ref 0.1–1.0)
Monocytes Relative: 20 %
Neutro Abs: 1.2 K/uL — ABNORMAL LOW (ref 1.7–7.7)
Neutrophils Relative %: 46 %
Platelet Count: 173 K/uL (ref 150–400)
RBC: 4.13 MIL/uL (ref 3.87–5.11)
RDW: 14.2 % (ref 11.5–15.5)
WBC Count: 2.6 K/uL — ABNORMAL LOW (ref 4.0–10.5)
nRBC: 0 % (ref 0.0–0.2)

## 2022-11-13 LAB — CMP (CANCER CENTER ONLY)
ALT: 15 U/L (ref 0–44)
AST: 23 U/L (ref 15–41)
Albumin: 4.1 g/dL (ref 3.5–5.0)
Alkaline Phosphatase: 107 U/L (ref 38–126)
Anion gap: 7 (ref 5–15)
BUN: 15 mg/dL (ref 8–23)
CO2: 26 mmol/L (ref 22–32)
Calcium: 9.3 mg/dL (ref 8.9–10.3)
Chloride: 108 mmol/L (ref 98–111)
Creatinine: 0.86 mg/dL (ref 0.44–1.00)
GFR, Estimated: >60 mL/min (ref >60)
Glucose, Bld: 102 mg/dL — ABNORMAL HIGH (ref 70–99)
Potassium: 3.6 mmol/L (ref 3.5–5.1)
Sodium: 141 mmol/L (ref 135–145)
Total Bilirubin: 0.8 mg/dL (ref <1.2)
Total Protein: 6.9 g/dL (ref 6.5–8.1)

## 2022-11-13 MED ORDER — SODIUM CHLORIDE 0.9 % IV SOLN
Freq: Once | INTRAVENOUS | Status: DC | PRN
Start: 2022-11-13 — End: 2022-11-13

## 2022-11-13 MED ORDER — METHYLPREDNISOLONE SODIUM SUCC 125 MG IJ SOLR
125.0000 mg | Freq: Once | INTRAMUSCULAR | Status: AC
  Administered 2022-11-13: 125 mg via INTRAVENOUS
  Filled 2022-11-13: qty 2

## 2022-11-13 MED ORDER — FAMOTIDINE IN NACL 20-0.9 MG/50ML-% IV SOLN
20.0000 mg | Freq: Once | INTRAVENOUS | Status: AC | PRN
  Administered 2022-11-13: 20 mg via INTRAVENOUS

## 2022-11-13 MED ORDER — SODIUM CHLORIDE 0.9 % IV SOLN: INTRAVENOUS | Status: DC

## 2022-11-13 MED ORDER — MONTELUKAST SODIUM 10 MG PO TABS
10.0000 mg | ORAL_TABLET | Freq: Once | ORAL | Status: AC
Start: 2022-11-13 — End: 2022-11-13
  Administered 2022-11-13: 10 mg via ORAL
  Filled 2022-11-13: qty 1

## 2022-11-13 MED ORDER — ACETAMINOPHEN 325 MG PO TABS
650.0000 mg | ORAL_TABLET | Freq: Once | ORAL | Status: AC
  Administered 2022-11-13: 650 mg via ORAL
  Filled 2022-11-13: qty 2

## 2022-11-13 MED ORDER — LORAZEPAM 1 MG PO TABS
0.5000 mg | ORAL_TABLET | Freq: Once | ORAL | Status: AC
  Administered 2022-11-13: 0.5 mg via ORAL
  Filled 2022-11-13: qty 1

## 2022-11-13 MED ORDER — FAMOTIDINE IN NACL 20-0.9 MG/50ML-% IV SOLN
20.0000 mg | Freq: Once | INTRAVENOUS | Status: AC
  Administered 2022-11-13: 20 mg via INTRAVENOUS
  Filled 2022-11-13: qty 50

## 2022-11-13 MED ORDER — SODIUM CHLORIDE 0.9 % IV SOLN
1000.0000 mg | Freq: Once | INTRAVENOUS | Status: AC
  Administered 2022-11-13: 1000 mg via INTRAVENOUS
  Filled 2022-11-13: qty 100

## 2022-11-13 MED ORDER — DIPHENHYDRAMINE HCL 25 MG PO CAPS
50.0000 mg | ORAL_CAPSULE | Freq: Once | ORAL | Status: AC
  Administered 2022-11-13: 50 mg via ORAL
  Filled 2022-11-13: qty 2

## 2022-11-13 NOTE — Progress Notes (Signed)
At approximately 1418 pt reported to this RN that her legs were restless and starting to ache. This RN gave patient warm towels and repositioned patient in the chair. This RN informed Dr. Candise Che of findings. 1441 patient complained of worsening leg pain and new onset nausea. Infusion stopped and IV fluids hung to gravity.  1443 Monica Charity fundraiser reached out to [place], [place] to see patient chairside. Pepcid hung to gravity. Per Jae Dire, this is likely due to benadryl, not a hypersensitivity.  1459 Ativan 0.5 mg given PO. This RN gave patient snacks and beverage. 1505 Patient states nausea had resolved, leg pain improving. Patient ambulatory to restroom, upon returning, pt informed this RN That her legs were much better following movement. 1535 Ruxience restarted at rate 35 mg/hr per [place], [place].  See MAR for med. Administration. See flowsheets for vital signs.

## 2022-11-13 NOTE — Progress Notes (Signed)
Per Dr. Candise Che, ok to stop Rituximab treatment at 1800 today.

## 2022-11-13 NOTE — Patient Instructions (Signed)
[facility] - A DEPT OF [facility]  Discharge Instructions: Thank you for choosing [facility] to provide your oncology and hematology care.   If you have a lab appointment with the Cancer Center, please go directly to the Cancer Center and check in at the registration area.   Wear comfortable clothing and clothing appropriate for easy access to any Portacath or PICC line.   We strive to give you quality time with your provider. You may need to reschedule your appointment if you arrive late (15 or more minutes).  Arriving late affects you and other patients whose appointments are after yours.  Also, if you miss three or more appointments without notifying the office, you may be dismissed from the clinic at the provider's discretion.      For prescription refill requests, have your pharmacy contact our office and allow 72 hours for refills to be completed.    Today you received the following chemotherapy and/or immunotherapy agents Rituxan      To help prevent nausea and vomiting after your treatment, we encourage you to take your nausea medication as directed.  BELOW ARE SYMPTOMS THAT SHOULD BE REPORTED IMMEDIATELY: *FEVER GREATER THAN 100.4 F (38 C) OR HIGHER *CHILLS OR SWEATING *NAUSEA AND VOMITING THAT IS NOT CONTROLLED WITH YOUR NAUSEA MEDICATION *UNUSUAL SHORTNESS OF BREATH *UNUSUAL BRUISING OR BLEEDING *URINARY PROBLEMS (pain or burning when urinating, or frequent urination) *BOWEL PROBLEMS (unusual diarrhea, constipation, pain near the anus) TENDERNESS IN MOUTH AND THROAT WITH OR WITHOUT PRESENCE OF ULCERS (sore throat, sores in mouth, or a toothache) UNUSUAL RASH, SWELLING OR PAIN  UNUSUAL VAGINAL DISCHARGE OR ITCHING   Items with * indicate a potential emergency and should be followed up as soon as possible or go to the Emergency Department if any problems should occur.  Please show the CHEMOTHERAPY ALERT CARD or IMMUNOTHERAPY  ALERT CARD at check-in to the Emergency Department and triage nurse.  Should you have questions after your visit or need to cancel or reschedule your appointment, please contact [facility] - A DEPT OF [facility]  Dept: [phone]  and follow the prompts.  Office hours are 8:00 a.m. to 4:30 p.m. Monday - Friday. Please note that voicemails left after 4:00 p.m. may not be returned until the following business day.  We are closed weekends and major holidays. You have access to a nurse at all times for urgent questions. Please call the main number to the clinic Dept: [phone] and follow the prompts.   For any non-urgent questions, you may also contact your provider using MyChart. We now offer e-Visits for anyone 79 and older to request care online for non-urgent symptoms. For details visit [email or web address].   Also download the MyChart app! Go to the app store, search "MyChart", open the app, select [facility], and log in with your MyChart username and password.

## 2022-11-26 ENCOUNTER — Other Ambulatory Visit: Payer: Self-pay | Admitting: *Deleted

## 2022-11-26 DIAGNOSIS — D693 Immune thrombocytopenic purpura: Secondary | ICD-10-CM

## 2022-11-27 ENCOUNTER — Inpatient Hospital Stay: Payer: Medicare HMO

## 2022-11-27 VITALS — BP 136/83 | HR 66 | Temp 98.0°F | Resp 18 | Wt 183.1 lb

## 2022-11-27 DIAGNOSIS — Z5112 Encounter for antineoplastic immunotherapy: Secondary | ICD-10-CM | POA: Diagnosis not present

## 2022-11-27 DIAGNOSIS — D693 Immune thrombocytopenic purpura: Secondary | ICD-10-CM

## 2022-11-27 LAB — CBC WITH DIFFERENTIAL (CANCER CENTER ONLY)
Abs Immature Granulocytes: 0 10*3/uL (ref 0.00–0.07)
Basophils Absolute: 0 10*3/uL (ref 0.0–0.1)
Basophils Relative: 0 %
Eosinophils Absolute: 0.2 10*3/uL (ref 0.0–0.5)
Eosinophils Relative: 5 %
HCT: 37.9 % (ref 36.0–46.0)
Hemoglobin: 12.7 g/dL (ref 12.0–15.0)
Immature Granulocytes: 0 %
Lymphocytes Relative: 29 %
Lymphs Abs: 0.9 10*3/uL (ref 0.7–4.0)
MCH: 29.9 pg (ref 26.0–34.0)
MCHC: 33.5 g/dL (ref 30.0–36.0)
MCV: 89.2 fL (ref 80.0–100.0)
Monocytes Absolute: 0.5 10*3/uL (ref 0.1–1.0)
Monocytes Relative: 15 %
Neutro Abs: 1.6 10*3/uL — ABNORMAL LOW (ref 1.7–7.7)
Neutrophils Relative %: 51 %
Platelet Count: 204 10*3/uL (ref 150–400)
RBC: 4.25 MIL/uL (ref 3.87–5.11)
RDW: 14.4 % (ref 11.5–15.5)
WBC Count: 3.1 10*3/uL — ABNORMAL LOW (ref 4.0–10.5)
nRBC: 0 % (ref 0.0–0.2)

## 2022-11-27 LAB — CMP (CANCER CENTER ONLY)
ALT: 12 U/L (ref 0–44)
AST: 18 U/L (ref 15–41)
Albumin: 4.1 g/dL (ref 3.5–5.0)
Alkaline Phosphatase: 112 U/L (ref 38–126)
Anion gap: 5 (ref 5–15)
BUN: 13 mg/dL (ref 8–23)
CO2: 26 mmol/L (ref 22–32)
Calcium: 9.3 mg/dL (ref 8.9–10.3)
Chloride: 110 mmol/L (ref 98–111)
Creatinine: 0.86 mg/dL (ref 0.44–1.00)
GFR, Estimated: >60 mL/min (ref >60)
Glucose, Bld: 146 mg/dL — ABNORMAL HIGH (ref 70–99)
Potassium: 3.6 mmol/L (ref 3.5–5.1)
Sodium: 141 mmol/L (ref 135–145)
Total Bilirubin: 0.7 mg/dL (ref <1.2)
Total Protein: 7 g/dL (ref 6.5–8.1)

## 2022-11-27 MED ORDER — LORAZEPAM 1 MG PO TABS
0.5000 mg | ORAL_TABLET | Freq: Once | ORAL | Status: AC
  Administered 2022-11-27: 0.5 mg via ORAL
  Filled 2022-11-27: qty 1

## 2022-11-27 MED ORDER — SODIUM CHLORIDE 0.9 % IV SOLN: INTRAVENOUS | Status: DC

## 2022-11-27 MED ORDER — METHYLPREDNISOLONE SODIUM SUCC 125 MG IJ SOLR
125.0000 mg | Freq: Once | INTRAMUSCULAR | Status: AC
  Administered 2022-11-27: 125 mg via INTRAVENOUS
  Filled 2022-11-27: qty 2

## 2022-11-27 MED ORDER — MONTELUKAST SODIUM 10 MG PO TABS
10.0000 mg | ORAL_TABLET | Freq: Once | ORAL | Status: AC
  Administered 2022-11-27: 10 mg via ORAL
  Filled 2022-11-27: qty 1

## 2022-11-27 MED ORDER — ACETAMINOPHEN 325 MG PO TABS
650.0000 mg | ORAL_TABLET | Freq: Once | ORAL | Status: AC
  Administered 2022-11-27: 650 mg via ORAL
  Filled 2022-11-27: qty 2

## 2022-11-27 MED ORDER — DIPHENHYDRAMINE HCL 25 MG PO CAPS
50.0000 mg | ORAL_CAPSULE | Freq: Once | ORAL | Status: AC
  Administered 2022-11-27: 50 mg via ORAL
  Filled 2022-11-27: qty 2

## 2022-11-27 MED ORDER — FAMOTIDINE IN NACL 20-0.9 MG/50ML-% IV SOLN
20.0000 mg | Freq: Once | INTRAVENOUS | Status: AC
  Administered 2022-11-27: 20 mg via INTRAVENOUS
  Filled 2022-11-27: qty 50

## 2022-11-27 MED ORDER — SODIUM CHLORIDE 0.9 % IV SOLN
1000.0000 mg | Freq: Once | INTRAVENOUS | Status: AC
  Administered 2022-11-27: 1000 mg via INTRAVENOUS
  Filled 2022-11-27: qty 100

## 2022-11-27 NOTE — Patient Instructions (Signed)
[facility] - A DEPT OF [facility]  Discharge Instructions: Thank you for choosing [facility] to provide your oncology and hematology care.   If you have a lab appointment with the Cancer Center, please go directly to the Cancer Center and check in at the registration area.   Wear comfortable clothing and clothing appropriate for easy access to any Portacath or PICC line.   We strive to give you quality time with your provider. You may need to reschedule your appointment if you arrive late (15 or more minutes).  Arriving late affects you and other patients whose appointments are after yours.  Also, if you miss three or more appointments without notifying the office, you may be dismissed from the clinic at the provider's discretion.      For prescription refill requests, have your pharmacy contact our office and allow 72 hours for refills to be completed.    Today you received the following chemotherapy and/or immunotherapy agents Rituxan      To help prevent nausea and vomiting after your treatment, we encourage you to take your nausea medication as directed.  BELOW ARE SYMPTOMS THAT SHOULD BE REPORTED IMMEDIATELY: *FEVER GREATER THAN 100.4 F (38 C) OR HIGHER *CHILLS OR SWEATING *NAUSEA AND VOMITING THAT IS NOT CONTROLLED WITH YOUR NAUSEA MEDICATION *UNUSUAL SHORTNESS OF BREATH *UNUSUAL BRUISING OR BLEEDING *URINARY PROBLEMS (pain or burning when urinating, or frequent urination) *BOWEL PROBLEMS (unusual diarrhea, constipation, pain near the anus) TENDERNESS IN MOUTH AND THROAT WITH OR WITHOUT PRESENCE OF ULCERS (sore throat, sores in mouth, or a toothache) UNUSUAL RASH, SWELLING OR PAIN  UNUSUAL VAGINAL DISCHARGE OR ITCHING   Items with * indicate a potential emergency and should be followed up as soon as possible or go to the Emergency Department if any problems should occur.  Please show the CHEMOTHERAPY ALERT CARD or IMMUNOTHERAPY  ALERT CARD at check-in to the Emergency Department and triage nurse.  Should you have questions after your visit or need to cancel or reschedule your appointment, please contact [facility] - A DEPT OF [facility]  Dept: [phone]  and follow the prompts.  Office hours are 8:00 a.m. to 4:30 p.m. Monday - Friday. Please note that voicemails left after 4:00 p.m. may not be returned until the following business day.  We are closed weekends and major holidays. You have access to a nurse at all times for urgent questions. Please call the main number to the clinic Dept: [phone] and follow the prompts.   For any non-urgent questions, you may also contact your provider using MyChart. We now offer e-Visits for anyone 79 and older to request care online for non-urgent symptoms. For details visit [email or web address].   Also download the MyChart app! Go to the app store, search "MyChart", open the app, select [facility], and log in with your MyChart username and password.

## 2022-12-02 ENCOUNTER — Ambulatory Visit: Payer: Medicare HMO | Admitting: Hematology

## 2022-12-07 ENCOUNTER — Encounter: Payer: Self-pay | Admitting: Hematology

## 2022-12-07 NOTE — Progress Notes (Signed)
This encounter was created in error - please disregard.

## 2023-01-13 ENCOUNTER — Encounter: Payer: Self-pay | Admitting: Hematology

## 2023-01-15 ENCOUNTER — Encounter: Payer: Self-pay | Admitting: Hematology

## 2023-01-25 ENCOUNTER — Telehealth (HOSPITAL_BASED_OUTPATIENT_CLINIC_OR_DEPARTMENT_OTHER): Payer: Self-pay | Admitting: *Deleted

## 2023-01-25 ENCOUNTER — Ambulatory Visit (HOSPITAL_BASED_OUTPATIENT_CLINIC_OR_DEPARTMENT_OTHER): Payer: Medicare Other | Admitting: Cardiology

## 2023-01-25 VITALS — BP 112/70 | HR 93 | Ht 63.0 in | Wt 177.0 lb

## 2023-01-25 DIAGNOSIS — I4892 Unspecified atrial flutter: Secondary | ICD-10-CM | POA: Diagnosis not present

## 2023-01-25 DIAGNOSIS — I10 Essential (primary) hypertension: Secondary | ICD-10-CM | POA: Diagnosis not present

## 2023-01-25 DIAGNOSIS — I7774 Dissection of vertebral artery: Secondary | ICD-10-CM

## 2023-01-25 NOTE — Telephone Encounter (Signed)
Faxed medical records request for EKG from 09/24/2022 hospital stay at [facility]  Fax # [phone]

## 2023-01-25 NOTE — Progress Notes (Signed)
Cardiology Office Note:  .   Date:  01/25/2023  ID:  Elaine Allen, DOB 1955-12-28, MRN [id number] PCP: Hillery Aldo, NP  [facility] Providers Cardiologist:  None     History of Present Illness: .   Elaine Allen is a 68 y.o. female Discussed with the use of AI scribe   History of Present Illness   The patient, a 68 year old with a history of hypertension, lupus, immune thrombocytopenia, and prediabetes, was recently admitted to the hospital in [place] due to weakness and numbness on the left side. The patient described the sensation as frightening and was discharged with Plavix, atorvastatin, hydralazine, losartan, and hydrochlorothiazide. Since the stroke, the patient has experienced persistent weakness and numbness in the left hand and foot, but no falls have been reported.  The patient has been seeing a hematology oncologist for immune thrombocytopenia and is also being treated for lupus with Rituxan. The patient's hemoglobin A1c is 6.1, indicating prediabetes.  During the recent hospital admission, an EKG revealed atrial flutter with a heart rate of 86 beats per minute. The patient was started on metoprolol succinate 25. The patient reports no further episodes of atrial fibrillation or flutter since the hospital admission.  The patient has been experiencing numbness in the left foot and hand, with no grip strength in the hand. The patient also reports tingling in the hand. These symptoms have been ongoing since the stroke. The patient also reports long-standing issues with leg swelling and numbness, which have been present for over a year.  The patient has a history of systemic lupus and immune thrombocytopenia, with platelet counts dropping significantly at times. The patient's LDL was 128 at last check, with a goal of less than 70. The patient's echocardiogram showed an EF of 55-70% with RVSP of 25-30 and mild tricuspid regurgitation.          Studies  Reviewed: Marland Kitchen   EKG Interpretation Date/Time:  Monday January 25 2023 08:42:15 EST Ventricular Rate:  94 PR Interval:  142 QRS Duration:  72 QT Interval:  378 QTC Calculation: 472 R Axis:   29  Text Interpretation: Normal sinus rhythm Nonspecific ST abnormality When compared with ECG of 18-Mar-2022 14:29, No significant change since last tracing Confirmed by Donato Schultz ([id number]) on 01/25/2023 9:04:31 AM    Results   LABS Hemoglobin A1c: 6.1% LDL: 128 mg/dL  RADIOLOGY CT brain: Chronic findings, prior stroke, no new infarct or hemorrhage CTA carotid artery: No evidence of stenosis MRI brain: No new infarcts  DIAGNOSTIC EKG: Atrial flutter with heart rate of 86 bpm (09/23/2022) Transesophageal echocardiogram: No patent foramen ovale (PFO) Echocardiogram: Ejection fraction (EF) 55-70%, right ventricular systolic pressure (RVSP) 25-30 mmHg, mild tricuspid regurgitation     Risk Assessment/Calculations:            Physical Exam:   VS:  BP 112/70 (BP Location: Left Arm, Patient Position: Sitting, Cuff Size: Normal)   Pulse 93   Ht 5\' 3"  (1.6 m)   Wt 177 lb (80.3 kg)   SpO2 98%   BMI 31.35 kg/m    Wt Readings from Last 3 Encounters:  01/25/23 177 lb (80.3 kg)  11/27/22 183 lb 1.3 oz (83 kg)  11/13/22 181 lb 8 oz (82.3 kg)    GEN: Well nourished, well developed in no acute distress NECK: No JVD; No carotid bruits CARDIAC: RRR, no murmurs, no rubs, no gallops RESPIRATORY:  Clear to auscultation without rales, wheezing or rhonchi  ABDOMEN: Soft, non-tender, non-distended  EXTREMITIES:  No edema; No deformity   ASSESSMENT AND PLAN: .    Assessment and Plan    Transient Ischemic Attack (TIA)   Recent admission on 09/25/2022 for left-sided weakness and numbness, diagnosed with TIA. Persistent weakness and numbness in the left hand and foot. Discharged with Plavix and atorvastatin. EKG showed atrial flutter with a heart rate of 86 bpm. No further episodes noted on telemetry.  Given systemic lupus and immune thrombocytopenia, there is concern about full-dose anticoagulation. Discussed loop recorder implantation to monitor for atrial fibrillation or flutter. If confirmed, treatment options include Eliquis or a Watchman device.   - Order EP consideration for loop recorder implantation   - Gather records from [facility] in [place], including EKGs and telemetry data. According to H and P in care everywhere, Atrial flutter was noted on admission for TIA.  - PRN follow-up after gathering more information    Paroxysmal Atrial Fibrillation/Flutter  Paroxysmal atrial fibrillation diagnosed in her thirties. Recent EKG showed atrial flutter. Given systemic lupus and immune thrombocytopenia, there is concern about full-dose anticoagulation. Discussed potential use of Eliquis or a Watchman device if atrial fibrillation is confirmed.   - Order loop recorder implantation   - Gather records from [facility] in [place], including EKGs and telemetry data   - PRN follow-up after gathering more information    Systemic Lupus Erythematosus (SLE)   Systemic lupus erythematosus, currently on Rituxan. Symptoms include fatigue, possibly related to SLE.    Immune Thrombocytopenia (ITP)   Immune thrombocytopenia with platelet counts dropping as low as 3000. Monitored by hematology/oncology. Concern about full-dose anticoagulation due to bleeding risk.    Hypertension   Hypertension managed with hydralazine, losartan, and hydrochlorothiazide.    Prediabetes   Prediabetes with a hemoglobin A1c of 6.1.    General Health Maintenance   Nonsmoker. No specific discussions on screenings or vaccinations during this visit.    Follow-up   - PRN follow-up after gathering more information               Signed, Donato Schultz, MD

## 2023-01-25 NOTE — Patient Instructions (Addendum)
Medication Instructions:  Your physician recommends that you continue on your current medications as directed. Please refer to the Current Medication list given to you today.   Labwork: NONE  Testing/Procedures: NONE  Follow-Up: AS NEEDED   You have been referred to ELECTROPHYSIOLOGIST  IF YOU DO NOT HEAR FROM THEM IN 2 WEEKS YOU CAN CALL THEM DIRECTLY AT HIGHLIGHTED NUMBER

## 2023-02-25 ENCOUNTER — Other Ambulatory Visit: Payer: Self-pay

## 2023-02-25 DIAGNOSIS — D693 Immune thrombocytopenic purpura: Secondary | ICD-10-CM

## 2023-02-26 ENCOUNTER — Inpatient Hospital Stay: Payer: Medicare Other | Admitting: Hematology

## 2023-02-26 ENCOUNTER — Inpatient Hospital Stay: Payer: Medicare Other | Attending: Hematology

## 2023-03-08 ENCOUNTER — Encounter: Payer: Self-pay | Admitting: Cardiovascular Disease

## 2023-03-08 ENCOUNTER — Ambulatory Visit: Payer: Medicare Other | Attending: Cardiovascular Disease | Admitting: Cardiovascular Disease

## 2023-03-08 VITALS — BP 140/96 | HR 80 | Ht 63.0 in | Wt 185.4 lb

## 2023-03-08 DIAGNOSIS — I4892 Unspecified atrial flutter: Secondary | ICD-10-CM

## 2023-03-08 DIAGNOSIS — Z8673 Personal history of transient ischemic attack (TIA), and cerebral infarction without residual deficits: Secondary | ICD-10-CM

## 2023-03-08 NOTE — Progress Notes (Signed)
 Electrophysiology Office Note:    Date:  03/08/2023   ID:  Elaine Allen, DOB Oct 22, 1955, MRN [id number]  PCP:  Hillery Aldo, NP   [facility] Providers Cardiologist:  None     Referring MD: Jake Bathe, MD   History of Present Illness:    Elaine Allen is a 68 y.o. female with a medical history significant for stroke hypertension, lupus, immune thrombocytopenia, prediabetes, referred for possible loop implant.     She had a right basilar ganglia lacunar infarct in 2020.  She was hospitalized in [place] with acute weakness and numbness on the left side.  I reviewed notes from that admission.  CT redemonstrated chronic findings of prior CVA with no new infarct or hemorrhage.  During the admission she was noted to have atrial flutter with controlled heart rate and discharged on metoprolol succinate.  She was also discharged on Plavix, atorvastatin and antihypertensive medications.      Today, she reports that she is at baseline -- she has some left-sided foot numbness and left hand paresthesias and weakness.  She has never experienced palpitations or any other heart problem for that matter.  EKGs/Labs/Other Studies Reviewed Today:     Echocardiogram:  [facility] TTE September 2024 EF 65-70 %.  No   Monitors:   Head/Brain Cts and MRI reviewed in Care Everywhere  EKG:   EKG Interpretation Date/Time:  Monday March 08 2023 08:44:10 EST Ventricular Rate:  80 PR Interval:  140 QRS Duration:  78 QT Interval:  390 QTC Calculation: 449 R Axis:   55  Text Interpretation: Normal sinus rhythm Normal ECG When compared with ECG of 25-Jan-2023 08:42, No significant change was found Confirmed by York Pellant [phone]) on 03/08/2023 9:06:19 AM     Physical Exam:    VS:  BP (!) 140/96 (BP Location: Left Arm, Patient Position: Sitting, Cuff Size: Large)   Pulse 80   Ht 5\' 3"  (1.6 m)   Wt 185 lb 6.4 oz (84.1 kg)   SpO2 99%   BMI 32.84 kg/m     Wt Readings  from Last 3 Encounters:  03/08/23 185 lb 6.4 oz (84.1 kg)  01/25/23 177 lb (80.3 kg)  11/27/22 183 lb 1.3 oz (83 kg)     GEN: Well nourished, well developed in no acute distress CARDIAC: RRR, no murmurs, rubs, gallops RESPIRATORY:  Normal work of breathing MUSCULOSKELETAL: no edema    ASSESSMENT & PLAN:     TIA/stroke Will monitor for recurrent arrhythmia as potential cause The most recent event, in 09/2022, was attributed to reactivation of old stroke rather than new potential cardiac embolism  Atrial flutter Reported in records from stroke admission September 2024 I have not found ECGs to review  We will place loop recorder today to monitor for recurrence and manage  Autoimmune disorders With lupus and immune thrombocytopenia, would want a definitive indication of arrhythmia as a potential cause of cardioembolism to start anticoagulation   Signed, Maurice Small, MD  03/08/2023 9:10 AM    [facility]  SURGEON:  Maurice Small, MD     PREPROCEDURE DIAGNOSIS:  Atrial flutter    POSTPROCEDURE DIAGNOSIS:  Atrial flutter     PROCEDURES:   1. Implantable loop recorder implantation    INTRODUCTION:   Elaine Allen  is a 68 y.o. patient with a history of cryptogenic stroke. Inpatient telemetry has been reviewed and not shown atrial fibrillation. The patient therefore presents today for implantable loop implantation.  The costs of loop recorder monitoring have been discussed with the patient.    DESCRIPTION OF PROCEDURE:  Informed written consent was obtained.  A timeout was performed. The patient required no sedation for the procedure today.  The patients left chest was prepped and draped in the usual sterile fashion. The skin overlying the left parasternal region was infiltrated with lidocaine for local analgesia.  A 0.5-cm incision was made over the left parasternal region over the 3rd intercostal space.  A Medtronic [id number]) implantable loop recorder  was then placed into the pocket  R waves were very prominent and measured >0.61mV.  Steri- Strips and a sterile dressing were then applied.  There were no early apparent complications.     CONCLUSIONS:   1. Successful implantation of a Medtronic implantable loop recorder for a history of cryptogenic stroke  2. No early apparent complications.   Maurice Small, MD  Cardiac Electrophysiology

## 2023-03-08 NOTE — Patient Instructions (Signed)
 Medication Instructions:  Your physician recommends that you continue on your current medications as directed. Please refer to the Current Medication list given to you today.  Labwork: None ordered.  Testing/Procedures: None ordered.  Follow-Up:  Your physician wants you to follow-up in: one year with Dr. York Pellant.  You will receive a reminder letter in the mail two months in advance. If you don't receive a letter, please call our office to schedule the follow-up appointment.  Implantable Loop Recorder Placement, Care After This sheet gives you information about how to care for yourself after your procedure. Your health care provider may also give you more specific instructions. If you have problems or questions, contact your health care provider.  What can I expect after the procedure? After the procedure, it is common to have: Soreness or discomfort near the incision. Some swelling or bruising near the incision.  Follow these instructions at home: Incision care  Monitor your cardiac device site for redness, swelling, and drainage. Call the device clinic at [phone] if you experience these symptoms or fever/chills.  Keep the large square bandage on your site for 24 hours and then you may remove it yourself. Keep the steri-strips underneath in place.   You may shower after 72 hours / 3 days from your procedure with the steri-strips in place. They will usually fall off on their own, or may be removed after 10 days. Pat dry.   Avoid lotions, ointments, or perfumes over your incision until it is well-healed.  Please do not submerge in water until your site is completely healed.   Your device is MRI compatible.   Remote monitoring is used to monitor your cardiac device from home. This monitoring is scheduled every month by our office. It allows [place] to keep an eye on the function of your device to ensure it is working properly.  If your wound site starts to bleed apply  pressure.    For help with the monitor please call Medtronic Monitor Support Specialist directly at [phone].    If you have any questions/concerns please call the device clinic at [phone].  Activity  Return to your normal activities.  General instructions Follow instructions from your health care provider about how to manage your implantable loop recorder and transmit the information. Learn how to activate a recording if this is necessary for your type of device. You may go through a metal detection gate, and you may let someone hold a metal detector over your chest. Show your ID card if needed. Do not have an MRI unless you check with your health care provider first. Take over-the-counter and prescription medicines only as told by your health care provider. Keep all follow-up visits as told by your health care provider. This is important. Contact a health care provider if: You have redness, swelling, or pain around your incision. You have a fever. You have pain that is not relieved by your pain medicine. You have triggered your device because of fainting (syncope) or because of a heartbeat that feels like it is racing, slow, fluttering, or skipping (palpitations). Get help right away if you have: Chest pain. Difficulty breathing. Summary After the procedure, it is common to have soreness or discomfort near the incision. Change your dressing as told by your health care provider. Follow instructions from your health care provider about how to manage your implantable loop recorder and transmit the information. Keep all follow-up visits as told by your health care provider. This is important. This information  is not intended to replace advice given to you by your health care provider. Make sure you discuss any questions you have with your health care provider. Document Released: 12/03/2014 Document Revised: 02/06/2017 Document Reviewed: 02/06/2017 Elsevier Patient Education   2020 [employer].

## 2023-03-29 ENCOUNTER — Inpatient Hospital Stay: Attending: Hematology

## 2023-03-29 ENCOUNTER — Inpatient Hospital Stay: Admitting: Hematology

## 2023-03-29 VITALS — BP 150/69 | HR 90 | Temp 97.9°F | Resp 17 | Ht 63.0 in | Wt 182.3 lb

## 2023-03-29 DIAGNOSIS — D693 Immune thrombocytopenic purpura: Secondary | ICD-10-CM | POA: Diagnosis not present

## 2023-03-29 DIAGNOSIS — Z5112 Encounter for antineoplastic immunotherapy: Secondary | ICD-10-CM | POA: Diagnosis not present

## 2023-03-29 DIAGNOSIS — Z79899 Other long term (current) drug therapy: Secondary | ICD-10-CM | POA: Diagnosis not present

## 2023-03-29 LAB — CBC WITH DIFFERENTIAL (CANCER CENTER ONLY)
Abs Immature Granulocytes: 0.01 10*3/uL (ref 0.00–0.07)
Basophils Absolute: 0 10*3/uL (ref 0.0–0.1)
Basophils Relative: 0 %
Eosinophils Absolute: 0.1 10*3/uL (ref 0.0–0.5)
Eosinophils Relative: 5 %
HCT: 36.5 % (ref 36.0–46.0)
Hemoglobin: 11.9 g/dL — ABNORMAL LOW (ref 12.0–15.0)
Immature Granulocytes: 0 %
Lymphocytes Relative: 29 %
Lymphs Abs: 0.8 10*3/uL (ref 0.7–4.0)
MCH: 29.2 pg (ref 26.0–34.0)
MCHC: 32.6 g/dL (ref 30.0–36.0)
MCV: 89.5 fL (ref 80.0–100.0)
Monocytes Absolute: 0.5 10*3/uL (ref 0.1–1.0)
Monocytes Relative: 18 %
Neutro Abs: 1.4 10*3/uL — ABNORMAL LOW (ref 1.7–7.7)
Neutrophils Relative %: 48 %
Platelet Count: 159 10*3/uL (ref 150–400)
RBC: 4.08 MIL/uL (ref 3.87–5.11)
RDW: 14.5 % (ref 11.5–15.5)
WBC Count: 2.8 10*3/uL — ABNORMAL LOW (ref 4.0–10.5)
nRBC: 0 % (ref 0.0–0.2)

## 2023-03-29 LAB — CMP (CANCER CENTER ONLY)
ALT: 10 U/L (ref 0–44)
AST: 15 U/L (ref 15–41)
Albumin: 4.1 g/dL (ref 3.5–5.0)
Alkaline Phosphatase: 113 U/L (ref 38–126)
Anion gap: 4 — ABNORMAL LOW (ref 5–15)
BUN: 13 mg/dL (ref 8–23)
CO2: 29 mmol/L (ref 22–32)
Calcium: 9.1 mg/dL (ref 8.9–10.3)
Chloride: 108 mmol/L (ref 98–111)
Creatinine: 0.85 mg/dL (ref 0.44–1.00)
GFR, Estimated: >60 mL/min (ref >60)
Glucose, Bld: 114 mg/dL — ABNORMAL HIGH (ref 70–99)
Potassium: 3.5 mmol/L (ref 3.5–5.1)
Sodium: 141 mmol/L (ref 135–145)
Total Bilirubin: 0.6 mg/dL (ref 0.0–1.2)
Total Protein: 6.6 g/dL (ref 6.5–8.1)

## 2023-03-29 NOTE — Progress Notes (Signed)
 HEMATOLOGY/ONCOLOGY CLINIC VISIT NOTE  Date of Service: 03/29/23   Patient Care Team: Hillery Aldo, NP as PCP - General Mealor, Roberts Gaudy, MD as PCP - Electrophysiology (Cardiology) Johney Maine, MD as Consulting Physician (Hematology)  REFERRING PHYSICIAN: Hillery Aldo, NP  CHIEF COMPLAINTS/PURPOSE OF CONSULTATION:  Follow-up for continued evaluation and management of ITP related to SLE  INTERVAL HISTORY  Elaine Allen is a 68 y.o. female here for continued evaluation and management of ITP related to SLE.   Patient was last seen by me on 10/16/2022 and she complained of sleep difficulties, body stiffness,  and small skin rash behind her left ear.  Patient notes she has been doing well overall since our last visit. She regularly follows-up with her Rheumatologist, who has started the patient on low-dose of Methotrexate. She has been tolerating her current dose of Methotrexate well without any new or severe toxicities.   Patient was having joint pain and joint inflammation due to Lupus, which is the reason she was started on Methotrexate.   She is currently applying an ointment and sunscreen for skin rashes related to her Lupus.   She denies any new infection issues, fever, chills , night sweats, unexpected weight loss, night sweats, unexpected weight loss, back pain, chest pain, abdominal pain, or leg swelling.   MEDICAL HISTORY:  Past Medical History:  Diagnosis Date   Arthritis    Blind right eye    sees colors   Chronic anemia    Detached retina, right    corrected good for about 6 months sees peripheral colors only   History of kidney stones    Hypertension    Hyperthyroidism    Lupus    Pneumonia    Retinal detachment    Spasmodic dysphonia    Stroke (HCC) 2015   no permanant defecits had weakness in left arm in hospital Had PT also   Vertigo    Hx of preceding stroke     SURGICAL HISTORY: Past Surgical History:  Procedure Laterality  Date   ABDOMINAL HYSTERECTOMY     BREAST EXCISIONAL BIOPSY Left    KNEE SURGERY Left    THYROID SURGERY     TOTAL HIP ARTHROPLASTY Left 06/27/2018   Procedure: TOTAL HIP ARTHROPLASTY ANTERIOR APPROACH;  Surgeon: Gean Birchwood, MD;  Location: [facility];  Service: Orthopedics;  Laterality: Left;     SOCIAL HISTORY: Social History   Socioeconomic History   Marital status: Single    Spouse name: Not on file   Number of children: 2   Years of education: college   Highest education level: Not on file  Occupational History   Occupation: options  Tobacco Use   Smoking status: Never    Passive exposure: Never   Smokeless tobacco: Never  Vaping Use   Vaping status: Never Used  Substance and Sexual Activity   Alcohol use: No   Drug use: No   Sexual activity: Not Currently  Other Topics Concern   Not on file  Social History Narrative   Not on file   Social Drivers of Health   Financial Resource Strain: Low Risk  (08/13/2020)   Overall Financial Resource Strain (CARDIA)    Difficulty of Paying Living Expenses: Not hard at all  Food Insecurity: No Food Insecurity (03/19/2022)   Hunger Vital Sign    Worried About Running Out of Food in the Last Year: Never true    Ran Out of Food in the Last Year: Never true  Transportation Needs: No Transportation Needs (03/19/2022)   PRAPARE - Administrator, Civil Service (Medical): No    Lack of Transportation (Non-Medical): No  Physical Activity: Sufficiently Active (08/13/2020)   Exercise Vital Sign    Days of Exercise per Week: 5 days    Minutes of Exercise per Session: 30 min  Stress: No Stress Concern Present (08/13/2020)   [employer] of Occupational Health - Occupational Stress Questionnaire    Feeling of Stress : Not at all  Social Connections: Moderately Integrated (08/13/2020)   Social Connection and Isolation Panel [NHANES]    Frequency of Communication with Friends and Family: More than three times a week    Frequency of  Social Gatherings with Friends and Family: More than three times a week    Attends Religious Services: More than 4 times per year    Active Member of [employer] or Organizations: Yes    Attends Engineer, structural: More than 4 times per year    Marital Status: Divorced  Intimate Partner Violence: Not At Risk (03/19/2022)   Humiliation, Afraid, Rape, and Kick questionnaire    Fear of Current or Ex-Partner: No    Emotionally Abused: No    Physically Abused: No    Sexually Abused: No     FAMILY HISTORY: Family History  Problem Relation Age of Onset   Cancer Mother        liver   Diabetes Mother    Arthritis Mother    Cancer Father        prostate,METS, stomach    Ovarian cancer Sister    Throat cancer Brother      ALLERGIES:   is allergic to plaquenil [hydroxychloroquine sulfate], amoxicillin, azithromycin, lactose intolerance (gi), penicillin g, and hydroxychloroquine sulfate.   MEDICATIONS:  Current Outpatient Medications  Medication Sig Dispense Refill   alum & mag hydroxide-simeth (MAALOX/MYLANTA) 200-200-20 MG/5ML suspension Take 30 mLs by mouth every 4 (four) hours as needed for indigestion or heartburn. 355 mL 0   amLODipine (NORVASC) 10 MG tablet Take 10 mg by mouth daily.     atorvastatin (LIPITOR) 40 MG tablet Take 40 mg by mouth daily.     cholecalciferol (VITAMIN D3) 25 MCG (1000 UNIT) tablet Take 1,000 Units by mouth daily.     loratadine (CLARITIN) 10 MG tablet Take 1 tablet (10 mg total) by mouth daily. 30 tablet 0   losartan-hydrochlorothiazide (HYZAAR) 100-25 MG tablet Take 1 tablet by mouth daily. 30 tablet 0   Multiple Vitamin (MULTIVITAMIN WITH MINERALS) TABS tablet Take 1 tablet by mouth daily.     potassium chloride SA (KLOR-CON M) 20 MEQ tablet Take 1 tablet (20 mEq total) by mouth daily. 30 tablet 0   Probiotic Product (PROBIOTIC PO) Take 1 capsule by mouth daily.     vitamin B-12 (CYANOCOBALAMIN) 1000 MCG tablet Take 1,000 mcg by mouth daily.      No current facility-administered medications for this visit.    REVIEW OF SYSTEMS:    10 Point review of Systems was done is negative except as noted above.   PHYSICAL EXAMINATION: .BP (!) 150/69 (BP Location: Left Arm, Patient Position: Sitting) Comment: Nurse notified  Pulse 90   Temp 97.9 F (36.6 C) (Temporal)   Resp 17   Ht 5\' 3"  (1.6 m)   Wt 182 lb 4.8 oz (82.7 kg)   SpO2 99%   BMI 32.29 kg/m   GENERAL:alert, in no acute distress and comfortable SKIN: no acute rashes, no significant  lesions EYES: conjunctiva are pink and non-injected, sclera anicteric OROPHARYNX: MMM, no exudates, no oropharyngeal erythema or ulceration NECK: supple, no JVD LYMPH:  no palpable lymphadenopathy in the cervical, axillary or inguinal regions LUNGS: clear to auscultation b/l with normal respiratory effort HEART: regular rate & rhythm ABDOMEN:  normoactive bowel sounds , non tender, not distended. Extremity: no pedal edema PSYCH: alert & oriented x 3 with fluent speech NEURO: no focal motor/sensory deficits   LABORATORY DATA:  I have reviewed the data as listed     Latest Ref Rng & Units 03/29/2023   12:32 PM 11/27/2022   11:36 AM 11/13/2022   10:32 AM  CBC  WBC 4.0 - 10.5 K/uL 2.8  3.1  2.6   Hemoglobin 12.0 - 15.0 g/dL 16.1  09.6  04.5   Hematocrit 36.0 - 46.0 % 36.5  37.9  36.3   Platelets 150 - 400 K/uL 159  204  173   ANC 800     Latest Ref Rng & Units 03/29/2023   12:32 PM 11/27/2022   11:36 AM 11/13/2022   10:32 AM  CMP  Glucose 70 - 99 mg/dL 409  811  914   BUN 8 - 23 mg/dL 13  13  15    Creatinine 0.44 - 1.00 mg/dL 7.82  9.56  2.13   Sodium 135 - 145 mmol/L 141  141  141   Potassium 3.5 - 5.1 mmol/L 3.5  3.6  3.6   Chloride 98 - 111 mmol/L 108  110  108   CO2 22 - 32 mmol/L 29  26  26    Calcium 8.9 - 10.3 mg/dL 9.1  9.3  9.3   Total Protein 6.5 - 8.1 g/dL 6.6  7.0  6.9   Total Bilirubin 0.0 - 1.2 mg/dL 0.6  0.7  0.8   Alkaline Phos 38 - 126 U/L 113  112  107    AST 15 - 41 U/L 15  18  23    ALT 0 - 44 U/L 10  12  15     .I have reviewed the above documentation for accuracy and completeness, and I agree with the above.  RADIOGRAPHIC STUDIES: I have personally reviewed the radiological images as listed and agreed with the findings in the report. No results found.  ASSESSMENT & PLAN:   Elaine Allen is a 68 y.o. female with:  1.  H/o Relapsing ITP due to lupus -Likely related to Immune thrombocytopenia in the setting of lupus. Patient was treated for her relapsed ITP with 4 doses of Rituxan again.  2. Lupus -being followed by Dr. Corliss Skains and now is transferring care to [facility]  3. H/o AVN -  related to steroids s/p left THA.  4. Lupus anticoagulant positive-history of old stroke but that was related to vertebral artery dissection. -Reasonable to consider baby aspirin 81 mg p.o. daily.  Will defer this to her primary care physician.  5.  Leukopenia/neutropenia -likely autoimmune related to lupus.   Plan:  -Discussed lab results from today, 03/29/2023, in detail with the patient. CBC shows slightly low WBC of 2.8 K, but stable overall.  CMP stable. -Continue Rituxan. 2 weekly doses every 8-months.  -will plan for labs in 3 months. -continue to stay UTD with age-appropriate vaccinations Patient to discuss need for continue Rituxan based on evaluation of continued repsonse. -rheumatology caonsult shall be called in again.  FOLLOW UP: Plz schedule C2 and C3 of Rituxan per integrated scheduling Labs and MD visit with  C2D1 and C3 D1  The total time spent in the appointment was 30 minutes* .  All of the patient's questions were answered with apparent satisfaction. The patient knows to call the clinic with any problems, questions or concerns.   Wyvonnia Lora MD MS AAHIVMS [facility] Hematology/Oncology Physician [facility]  .*Total Encounter Time as defined by the Centers for Medicare and Medicaid Services  includes, in addition to the face-to-face time of a patient visit (documented in the note above) non-face-to-face time: obtaining and reviewing outside history, ordering and reviewing medications, tests or procedures, care coordination (communications with other health care professionals or caregivers) and documentation in the medical record.   I,Param Shah,acting as a Neurosurgeon for Wyvonnia Lora, MD.,have documented all relevant documentation on the behalf of Wyvonnia Lora, MD,as directed by  Wyvonnia Lora, MD while in the presence of Wyvonnia Lora, MD.  .I have reviewed the above documentation for accuracy and completeness, and I agree with the above. Johney Maine MD

## 2023-04-04 ENCOUNTER — Encounter: Payer: Self-pay | Admitting: Hematology

## 2023-04-13 ENCOUNTER — Encounter: Payer: Self-pay | Admitting: Hematology

## 2023-04-20 ENCOUNTER — Ambulatory Visit (INDEPENDENT_AMBULATORY_CARE_PROVIDER_SITE_OTHER)

## 2023-04-20 DIAGNOSIS — I4892 Unspecified atrial flutter: Secondary | ICD-10-CM

## 2023-04-22 LAB — CUP PACEART REMOTE DEVICE CHECK
Date Time Interrogation Session: 20250415174208
Implantable Pulse Generator Implant Date: 20250303

## 2023-05-04 ENCOUNTER — Encounter: Payer: Self-pay | Admitting: Cardiovascular Disease

## 2023-05-11 ENCOUNTER — Other Ambulatory Visit: Payer: Self-pay

## 2023-05-11 DIAGNOSIS — D693 Immune thrombocytopenic purpura: Secondary | ICD-10-CM

## 2023-05-11 NOTE — Progress Notes (Signed)
 HEMATOLOGY/ONCOLOGY CLINIC VISIT NOTE  Date of Service: 05/12/2023  Patient Care Team: Maryellen Snare, NP as PCP - General Mealor, Donnamae Gaba, MD as PCP - Electrophysiology (Cardiology) Frankie Israel, MD as Consulting Physician (Hematology)  REFERRING PHYSICIAN: Frankie Israel, MD  CHIEF COMPLAINTS/PURPOSE OF CONSULTATION:  Follow-up for continued evaluation and management of ITP related to SLE  INTERVAL HISTORY  Elaine Allen is a 68 y.o. female here for continued evaluation and management of ITP related to SLE.   Patient was last seen by me on 03/29/2023 and reported joint pain and joint inflammation due to Lupus causing her to be started on Methotrexate. She also reported skin rashes related to her Lupus.   Today, she reports that she has been doing well since her last clinical visit. Patient is taking 3 tablets (7.5 MG total) Methotrexate once a week. She is also taking folic acid. Patient does not take vitamin B complex.   She reports that overall, her lupus has been well-controlled with some issues only in her ankle and face.   She reports that she has tolerated her last Rituxan  infusion. Patient notes that she generally feels better overall after Rituxan  infusions in terms of energy and functioning.   Patient reports that she continues to endorse brian fog which fluctuates.   She denies any infection issues. Patient reports that she is UTD with her age-appropriate vaccines, including shingles.   She reports having normal bowel habits and denies any abdominal pain.    Patient has not had any other medical issues in the last 6 months.   Patient is UTD with her routine mammograms, colonoscopies and other cancer screenings with her PCP.   MEDICAL HISTORY:  Past Medical History:  Diagnosis Date   Arthritis    Blind right eye    sees colors   Chronic anemia    Detached retina, right    corrected good for about 6 months sees peripheral colors only    History of kidney stones    Hypertension    Hyperthyroidism    Lupus    Pneumonia    Retinal detachment    Spasmodic dysphonia    Stroke (HCC) 2015   no permanant defecits had weakness in left arm in hospital Had PT also   Vertigo    Hx of preceding stroke     SURGICAL HISTORY: Past Surgical History:  Procedure Laterality Date   ABDOMINAL HYSTERECTOMY     BREAST EXCISIONAL BIOPSY Left    KNEE SURGERY Left    THYROID  SURGERY     TOTAL HIP ARTHROPLASTY Left 06/27/2018   Procedure: TOTAL HIP ARTHROPLASTY ANTERIOR APPROACH;  Surgeon: Wendolyn Hamburger, MD;  Location: [facility];  Service: Orthopedics;  Laterality: Left;     SOCIAL HISTORY: Social History   Socioeconomic History   Marital status: Single    Spouse name: Not on file   Number of children: 2   Years of education: college   Highest education level: Not on file  Occupational History   Occupation: options  Tobacco Use   Smoking status: Never    Passive exposure: Never   Smokeless tobacco: Never  Vaping Use   Vaping status: Never Used  Substance and Sexual Activity   Alcohol use: No   Drug use: No   Sexual activity: Not Currently  Other Topics Concern   Not on file  Social History Narrative   Not on file   Social Drivers of Health   Financial Resource Strain:  Low Risk  (08/13/2020)   Overall Financial Resource Strain (CARDIA)    Difficulty of Paying Living Expenses: Not hard at all  Food Insecurity: No Food Insecurity (03/19/2022)   Hunger Vital Sign    Worried About Running Out of Food in the Last Year: Never true    Ran Out of Food in the Last Year: Never true  Transportation Needs: No Transportation Needs (03/19/2022)   PRAPARE - Administrator, Civil Service (Medical): No    Lack of Transportation (Non-Medical): No  Physical Activity: Sufficiently Active (08/13/2020)   Exercise Vital Sign    Days of Exercise per Week: 5 days    Minutes of Exercise per Session: 30 min  Stress: No Stress Concern  Present (08/13/2020)   [employer] of Occupational Health - Occupational Stress Questionnaire    Feeling of Stress : Not at all  Social Connections: Moderately Integrated (08/13/2020)   Social Connection and Isolation Panel [NHANES]    Frequency of Communication with Friends and Family: More than three times a week    Frequency of Social Gatherings with Friends and Family: More than three times a week    Attends Religious Services: More than 4 times per year    Active Member of [employer] or Organizations: Yes    Attends Engineer, structural: More than 4 times per year    Marital Status: Divorced  Intimate Partner Violence: Not At Risk (03/19/2022)   Humiliation, Afraid, Rape, and Kick questionnaire    Fear of Current or Ex-Partner: No    Emotionally Abused: No    Physically Abused: No    Sexually Abused: No     FAMILY HISTORY: Family History  Problem Relation Age of Onset   Cancer Mother        liver   Diabetes Mother    Arthritis Mother    Cancer Father        prostate,METS, stomach    Ovarian cancer Sister    Throat cancer Brother      ALLERGIES:   is allergic to plaquenil [hydroxychloroquine sulfate], amoxicillin , azithromycin , lactose intolerance (gi), penicillin g, and hydroxychloroquine sulfate.   MEDICATIONS:  Current Outpatient Medications  Medication Sig Dispense Refill   alum & mag hydroxide-simeth (MAALOX/MYLANTA) 200-200-20 MG/5ML suspension Take 30 mLs by mouth every 4 (four) hours as needed for indigestion or heartburn. 355 mL 0   amLODipine  (NORVASC ) 10 MG tablet Take 10 mg by mouth daily.     atorvastatin (LIPITOR) 40 MG tablet Take 40 mg by mouth daily.     cholecalciferol  (VITAMIN D3) 25 MCG (1000 UNIT) tablet Take 1,000 Units by mouth daily.     loratadine  (CLARITIN ) 10 MG tablet Take 1 tablet (10 mg total) by mouth daily. 30 tablet 0   losartan -hydrochlorothiazide  (HYZAAR) 100-25 MG tablet Take 1 tablet by mouth daily. 30 tablet 0   Multiple  Vitamin (MULTIVITAMIN WITH MINERALS) TABS tablet Take 1 tablet by mouth daily.     potassium chloride  SA (KLOR-CON  M) 20 MEQ tablet Take 1 tablet (20 mEq total) by mouth daily. 30 tablet 0   Probiotic Product (PROBIOTIC PO) Take 1 capsule by mouth daily.     vitamin B-12 (CYANOCOBALAMIN ) 1000 MCG tablet Take 1,000 mcg by mouth daily.     No current facility-administered medications for this visit.    REVIEW OF SYSTEMS:    10 Point review of Systems was done is negative except as noted above.   PHYSICAL EXAMINATION: .There were no vitals  taken for this visit.   GENERAL:alert, in no acute distress and comfortable SKIN: no acute rashes, no significant lesions EYES: conjunctiva are pink and non-injected, sclera anicteric OROPHARYNX: MMM, no exudates, no oropharyngeal erythema or ulceration NECK: supple, no JVD LYMPH:  no palpable lymphadenopathy in the cervical, axillary or inguinal regions LUNGS: clear to auscultation b/l with normal respiratory effort HEART: regular rate & rhythm ABDOMEN:  normoactive bowel sounds , non tender, not distended. Extremity: no pedal edema PSYCH: alert & oriented x 3 with fluent speech NEURO: no focal motor/sensory deficits   LABORATORY DATA:  I have reviewed the data as listed     Latest Ref Rng & Units 03/29/2023   12:32 PM 11/27/2022   11:36 AM 11/13/2022   10:32 AM  CBC  WBC 4.0 - 10.5 K/uL 2.8  3.1  2.6   Hemoglobin 12.0 - 15.0 g/dL 51.8  84.1  66.0   Hematocrit 36.0 - 46.0 % 36.5  37.9  36.3   Platelets 150 - 400 K/uL 159  204  173   ANC 800     Latest Ref Rng & Units 03/29/2023   12:32 PM 11/27/2022   11:36 AM 11/13/2022   10:32 AM  CMP  Glucose 70 - 99 mg/dL 630  160  109   BUN 8 - 23 mg/dL 13  13  15    Creatinine 0.44 - 1.00 mg/dL 3.23  5.57  3.22   Sodium 135 - 145 mmol/L 141  141  141   Potassium 3.5 - 5.1 mmol/L 3.5  3.6  3.6   Chloride 98 - 111 mmol/L 108  110  108   CO2 22 - 32 mmol/L 29  26  26    Calcium 8.9 - 10.3 mg/dL  9.1  9.3  9.3   Total Protein 6.5 - 8.1 g/dL 6.6  7.0  6.9   Total Bilirubin 0.0 - 1.2 mg/dL 0.6  0.7  0.8   Alkaline Phos 38 - 126 U/L 113  112  107   AST 15 - 41 U/L 15  18  23    ALT 0 - 44 U/L 10  12  15     .I have reviewed the above documentation for accuracy and completeness, and I agree with the above.  RADIOGRAPHIC STUDIES: I have personally reviewed the radiological images as listed and agreed with the findings in the report. CUP PACEART REMOTE DEVICE CHECK Result Date: 04/22/2023 ILR summary report received. Battery status OK. Normal device function. No new symptom, tachy, brady, or pause episodes. No new AF episodes. Monthly summary reports and ROV/PRN ML, CVRS   ASSESSMENT & PLAN:   Elaine Allen is a 68 y.o. female with:  1.  H/o Relapsing ITP due to lupus -Likely related to Immune thrombocytopenia in the setting of lupus. Patient was treated for her relapsed ITP with 4 doses of Rituxan  again.  2. Lupus -being followed by Dr. Alvira Josephs and now is transferring care to [facility]  3. H/o AVN -  related to steroids s/p left THA.  4. Lupus anticoagulant positive-history of old stroke but that was related to vertebral artery dissection. -Reasonable to consider baby aspirin  81 mg p.o. daily.  Will defer this to her primary care physician.  5.  Leukopenia/neutropenia -likely autoimmune related to lupus.   Plan:  -Discussed lab results on 05/12/2023  in detail with patient. CBC showed WBC of 3.2K, hemoglobin of 12.0, and platelets of 193K. -her mildly low WBCs have improved.  She has  no neutropenia today. Neutrophils 1.8K.  -hgb stable -platelets normal -CMP pending at time of visit -did not feel any enlarged lymph nodes during physical examination -proceed with Rituxan  today, then in 2 weeks.  -continue folic acid to prevent bone marrow suppression -it would not be unreasonable to take vitamin B complex in addition to folic acid. Discussed that if her B  complex has enough folic acid component, it would be okay to only take B complex without additional folic acid supplement.  -patient shall return to clinic in back in 6 months -continue to follow up with rheumatologist regularly for lupus management  FOLLOW UP: Plz schedule C3 of Rituxan  per integrated scheduling Labs and MD visit with C3 D1  The total time spent in the appointment was *** minutes* .  All of the patient's questions were answered with apparent satisfaction. The patient knows to call the clinic with any problems, questions or concerns.   Jacquelyn Matt MD MS AAHIVMS [facility] Hematology/Oncology Physician [facility]  .*Total Encounter Time as defined by the Centers for Medicare and Medicaid Services includes, in addition to the face-to-face time of a patient visit (documented in the note above) non-face-to-face time: obtaining and reviewing outside history, ordering and reviewing medications, tests or procedures, care coordination (communications with other health care professionals or caregivers) and documentation in the medical record.    I,Mitra Faeizi,acting as a Neurosurgeon for Jacquelyn Matt, MD.,have documented all relevant documentation on the behalf of Jacquelyn Matt, MD,as directed by  Jacquelyn Matt, MD while in the presence of Jacquelyn Matt, MD.  ***

## 2023-05-12 ENCOUNTER — Inpatient Hospital Stay: Payer: Self-pay

## 2023-05-12 ENCOUNTER — Inpatient Hospital Stay: Payer: Self-pay | Attending: Hematology | Admitting: Hematology

## 2023-05-12 VITALS — BP 158/82 | HR 76 | Temp 98.3°F | Resp 16

## 2023-05-12 VITALS — BP 142/90 | HR 89 | Temp 97.9°F | Resp 18 | Ht 63.0 in | Wt 176.2 lb

## 2023-05-12 DIAGNOSIS — Z5112 Encounter for antineoplastic immunotherapy: Secondary | ICD-10-CM | POA: Diagnosis present

## 2023-05-12 DIAGNOSIS — Z79899 Other long term (current) drug therapy: Secondary | ICD-10-CM | POA: Insufficient documentation

## 2023-05-12 DIAGNOSIS — D72819 Decreased white blood cell count, unspecified: Secondary | ICD-10-CM | POA: Insufficient documentation

## 2023-05-12 DIAGNOSIS — M329 Systemic lupus erythematosus, unspecified: Secondary | ICD-10-CM | POA: Insufficient documentation

## 2023-05-12 DIAGNOSIS — D693 Immune thrombocytopenic purpura: Secondary | ICD-10-CM | POA: Insufficient documentation

## 2023-05-12 LAB — CMP (CANCER CENTER ONLY)
ALT: 11 U/L (ref 0–44)
AST: 17 U/L (ref 15–41)
Albumin: 4.2 g/dL (ref 3.5–5.0)
Alkaline Phosphatase: 96 U/L (ref 38–126)
Anion gap: 8 (ref 5–15)
BUN: 13 mg/dL (ref 8–23)
CO2: 27 mmol/L (ref 22–32)
Calcium: 9.1 mg/dL (ref 8.9–10.3)
Chloride: 107 mmol/L (ref 98–111)
Creatinine: 0.84 mg/dL (ref 0.44–1.00)
GFR, Estimated: >60 mL/min (ref >60)
Glucose, Bld: 105 mg/dL — ABNORMAL HIGH (ref 70–99)
Potassium: 3.8 mmol/L (ref 3.5–5.1)
Sodium: 142 mmol/L (ref 135–145)
Total Bilirubin: 0.7 mg/dL (ref 0.0–1.2)
Total Protein: 6.8 g/dL (ref 6.5–8.1)

## 2023-05-12 LAB — CBC WITH DIFFERENTIAL (CANCER CENTER ONLY)
Abs Immature Granulocytes: 0.01 10*3/uL (ref 0.00–0.07)
Basophils Absolute: 0 10*3/uL (ref 0.0–0.1)
Basophils Relative: 0 %
Eosinophils Absolute: 0.1 10*3/uL (ref 0.0–0.5)
Eosinophils Relative: 3 %
HCT: 35.3 % — ABNORMAL LOW (ref 36.0–46.0)
Hemoglobin: 12 g/dL (ref 12.0–15.0)
Immature Granulocytes: 0 %
Lymphocytes Relative: 21 %
Lymphs Abs: 0.7 10*3/uL (ref 0.7–4.0)
MCH: 30 pg (ref 26.0–34.0)
MCHC: 34 g/dL (ref 30.0–36.0)
MCV: 88.3 fL (ref 80.0–100.0)
Monocytes Absolute: 0.7 10*3/uL (ref 0.1–1.0)
Monocytes Relative: 20 %
Neutro Abs: 1.8 10*3/uL (ref 1.7–7.7)
Neutrophils Relative %: 56 %
Platelet Count: 193 10*3/uL (ref 150–400)
RBC: 4 MIL/uL (ref 3.87–5.11)
RDW: 15.7 % — ABNORMAL HIGH (ref 11.5–15.5)
WBC Count: 3.2 10*3/uL — ABNORMAL LOW (ref 4.0–10.5)
nRBC: 0 % (ref 0.0–0.2)

## 2023-05-12 MED ORDER — DIPHENHYDRAMINE HCL 25 MG PO CAPS
50.0000 mg | ORAL_CAPSULE | Freq: Once | ORAL | Status: AC
  Administered 2023-05-12: 50 mg via ORAL
  Filled 2023-05-12: qty 2

## 2023-05-12 MED ORDER — MONTELUKAST SODIUM 10 MG PO TABS
10.0000 mg | ORAL_TABLET | Freq: Once | ORAL | Status: AC
  Administered 2023-05-12: 10 mg via ORAL
  Filled 2023-05-12: qty 1

## 2023-05-12 MED ORDER — SODIUM CHLORIDE 0.9 % IV SOLN
1000.0000 mg | Freq: Once | INTRAVENOUS | Status: AC
  Administered 2023-05-12: 1000 mg via INTRAVENOUS
  Filled 2023-05-12: qty 100

## 2023-05-12 MED ORDER — ACETAMINOPHEN 325 MG PO TABS
650.0000 mg | ORAL_TABLET | Freq: Once | ORAL | Status: AC
Start: 2023-05-12 — End: 2023-05-12
  Administered 2023-05-12: 650 mg via ORAL
  Filled 2023-05-12: qty 2

## 2023-05-12 MED ORDER — METHYLPREDNISOLONE SODIUM SUCC 125 MG IJ SOLR
125.0000 mg | Freq: Once | INTRAMUSCULAR | Status: AC
  Administered 2023-05-12: 125 mg via INTRAVENOUS
  Filled 2023-05-12: qty 2

## 2023-05-12 MED ORDER — SODIUM CHLORIDE 0.9 % IV SOLN: INTRAVENOUS | Status: DC

## 2023-05-12 MED ORDER — FAMOTIDINE IN NACL 20-0.9 MG/50ML-% IV SOLN
20.0000 mg | Freq: Once | INTRAVENOUS | Status: AC
  Administered 2023-05-12: 20 mg via INTRAVENOUS
  Filled 2023-05-12: qty 50

## 2023-05-12 NOTE — Patient Instructions (Signed)
 [facility] A DEPT OF [facility]  Discharge Instructions: Thank you for choosing [facility] to provide your oncology and hematology care.   If you have a lab appointment with the Cancer Center, please go directly to the Cancer Center and check in at the registration area.   Wear comfortable clothing and clothing appropriate for easy access to any Portacath or PICC line.   We strive to give you quality time with your provider. You may need to reschedule your appointment if you arrive late (15 or more minutes).  Arriving late affects you and other patients whose appointments are after yours.  Also, if you miss three or more appointments without notifying the office, you may be dismissed from the clinic at the provider's discretion.      For prescription refill requests, have your pharmacy contact our office and allow 72 hours for refills to be completed.    Today you received the following chemotherapy and/or immunotherapy agents rituxan      To help prevent nausea and vomiting after your treatment, we encourage you to take your nausea medication as directed.  BELOW ARE SYMPTOMS THAT SHOULD BE REPORTED IMMEDIATELY: *FEVER GREATER THAN 100.4 F (38 C) OR HIGHER *CHILLS OR SWEATING *NAUSEA AND VOMITING THAT IS NOT CONTROLLED WITH YOUR NAUSEA MEDICATION *UNUSUAL SHORTNESS OF BREATH *UNUSUAL BRUISING OR BLEEDING *URINARY PROBLEMS (pain or burning when urinating, or frequent urination) *BOWEL PROBLEMS (unusual diarrhea, constipation, pain near the anus) TENDERNESS IN MOUTH AND THROAT WITH OR WITHOUT PRESENCE OF ULCERS (sore throat, sores in mouth, or a toothache) UNUSUAL RASH, SWELLING OR PAIN  UNUSUAL VAGINAL DISCHARGE OR ITCHING   Items with * indicate a potential emergency and should be followed up as soon as possible or go to the Emergency Department if any problems should occur.  Please show the CHEMOTHERAPY ALERT CARD or IMMUNOTHERAPY  ALERT CARD at check-in to the Emergency Department and triage nurse.  Should you have questions after your visit or need to cancel or reschedule your appointment, please contact [facility]  Dept: [phone]  and follow the prompts.  Office hours are 8:00 a.m. to 4:30 p.m. Monday - Friday. Please note that voicemails left after 4:00 p.m. may not be returned until the following business day.  We are closed weekends and major holidays. You have access to a nurse at all times for urgent questions. Please call the main number to the clinic Dept: [phone] and follow the prompts.   For any non-urgent questions, you may also contact your provider using MyChart. We now offer e-Visits for anyone 2 and older to request care online for non-urgent symptoms. For details visit [email or web address].   Also download the MyChart app! Go to the app store, search "MyChart", open the app, select [place], and log in with your MyChart username and password.

## 2023-05-18 ENCOUNTER — Encounter: Payer: Self-pay | Admitting: Hematology

## 2023-05-20 ENCOUNTER — Encounter: Payer: Self-pay | Admitting: Hematology

## 2023-05-25 ENCOUNTER — Other Ambulatory Visit: Payer: Self-pay | Admitting: *Deleted

## 2023-05-25 ENCOUNTER — Telehealth: Payer: Self-pay | Admitting: Cardiology

## 2023-05-25 ENCOUNTER — Ambulatory Visit (INDEPENDENT_AMBULATORY_CARE_PROVIDER_SITE_OTHER)

## 2023-05-25 DIAGNOSIS — I4892 Unspecified atrial flutter: Secondary | ICD-10-CM

## 2023-05-25 DIAGNOSIS — D693 Immune thrombocytopenic purpura: Secondary | ICD-10-CM

## 2023-05-25 NOTE — Telephone Encounter (Signed)
  Per MyChart scheduling message:  Initial complaint: I having SOB,when I take deep breath I cough ,so much to feel nausea. This has been going on pass two days   Pt c/o Shortness Of Breath: STAT if SOB developed within the last 24 hours or pt is noticeably SOB on the phone  1. Are you currently SOB (can you hear that pt is SOB on the phone)?   2. How long have you been experiencing SOB?   3. Are you SOB when sitting or when up moving around?   4. Are you currently experiencing any other symptoms?    Like a few days ,someday are worse than others, mostly standing.

## 2023-05-25 NOTE — Telephone Encounter (Signed)
 Left message to call back

## 2023-05-26 ENCOUNTER — Other Ambulatory Visit

## 2023-05-26 ENCOUNTER — Inpatient Hospital Stay

## 2023-05-26 ENCOUNTER — Ambulatory Visit: Admitting: Hematology

## 2023-05-26 ENCOUNTER — Telehealth: Payer: Self-pay

## 2023-05-26 DIAGNOSIS — D693 Immune thrombocytopenic purpura: Secondary | ICD-10-CM

## 2023-05-26 DIAGNOSIS — Z5112 Encounter for antineoplastic immunotherapy: Secondary | ICD-10-CM | POA: Diagnosis not present

## 2023-05-26 LAB — CMP (CANCER CENTER ONLY)
ALT: 9 U/L (ref 0–44)
AST: 14 U/L — ABNORMAL LOW (ref 15–41)
Albumin: 4 g/dL (ref 3.5–5.0)
Alkaline Phosphatase: 97 U/L (ref 38–126)
Anion gap: 3 — ABNORMAL LOW (ref 5–15)
BUN: 12 mg/dL (ref 8–23)
CO2: 30 mmol/L (ref 22–32)
Calcium: 9.3 mg/dL (ref 8.9–10.3)
Chloride: 110 mmol/L (ref 98–111)
Creatinine: 0.75 mg/dL (ref 0.44–1.00)
GFR, Estimated: >60 mL/min (ref >60)
Glucose, Bld: 121 mg/dL — ABNORMAL HIGH (ref 70–99)
Potassium: 3.6 mmol/L (ref 3.5–5.1)
Sodium: 143 mmol/L (ref 135–145)
Total Bilirubin: 0.6 mg/dL (ref 0.0–1.2)
Total Protein: 6.7 g/dL (ref 6.5–8.1)

## 2023-05-26 LAB — CUP PACEART REMOTE DEVICE CHECK
Date Time Interrogation Session: 20250520174217
Implantable Pulse Generator Implant Date: 20250303

## 2023-05-26 LAB — CBC WITH DIFFERENTIAL (CANCER CENTER ONLY)
Abs Immature Granulocytes: 0.01 10*3/uL (ref 0.00–0.07)
Basophils Absolute: 0 10*3/uL (ref 0.0–0.1)
Basophils Relative: 0 %
Eosinophils Absolute: 0.1 10*3/uL (ref 0.0–0.5)
Eosinophils Relative: 3 %
HCT: 34.7 % — ABNORMAL LOW (ref 36.0–46.0)
Hemoglobin: 11.8 g/dL — ABNORMAL LOW (ref 12.0–15.0)
Immature Granulocytes: 0 %
Lymphocytes Relative: 22 %
Lymphs Abs: 0.8 10*3/uL (ref 0.7–4.0)
MCH: 29.9 pg (ref 26.0–34.0)
MCHC: 34 g/dL (ref 30.0–36.0)
MCV: 88.1 fL (ref 80.0–100.0)
Monocytes Absolute: 0.5 10*3/uL (ref 0.1–1.0)
Monocytes Relative: 14 %
Neutro Abs: 2.2 10*3/uL (ref 1.7–7.7)
Neutrophils Relative %: 61 %
Platelet Count: 198 10*3/uL (ref 150–400)
RBC: 3.94 MIL/uL (ref 3.87–5.11)
RDW: 15.2 % (ref 11.5–15.5)
WBC Count: 3.6 10*3/uL — ABNORMAL LOW (ref 4.0–10.5)
nRBC: 0 % (ref 0.0–0.2)

## 2023-05-26 NOTE — Progress Notes (Signed)
 Rapid Infusion Rituximab  Pharmacist Evaluation  Elaine Allen is a 68 y.o. female being treated with rituximab  for ITP. This patient may be considered for RIR.   A pharmacist has verified the patient tolerated rituximab  infusions per the [facility] standard infusion protocol without grade 3-4 infusion reactions. The treatment plan will be updated to reflect RIR if the patient qualifies per the checklist below:   Age > 25 years old Yes   Clinically significant cardiovascular disease No   Circulating lymphocyte count < 5000/uL prior to cycle two Yes  Lab Results  Component Value Date   LYMPHSABS 0.7 05/12/2023    Prior documented grade 3-4 infusion reaction to rituximab  No   Prior documented grade 1-2 infusion reaction to rituximab  (If YES, Pharmacist will confirm with Physician if patient is still a candidate for RIR) No   Previous rituximab  infusion within the past 6 months Yes   Treatment Plan updated orders to reflect RIR Yes    Elaine Allen does meet the criteria for Rapid Infusion Rituximab . This patient is going to be switched to rapid infusion rituximab .   [name], PharmD, MBA

## 2023-05-26 NOTE — Telephone Encounter (Signed)
 I called patient and left a voicemail because she was an hour late for her appointment and asked her to call back.

## 2023-05-28 ENCOUNTER — Telehealth: Payer: Self-pay | Admitting: Hematology

## 2023-06-02 ENCOUNTER — Other Ambulatory Visit: Payer: Self-pay

## 2023-06-02 DIAGNOSIS — D693 Immune thrombocytopenic purpura: Secondary | ICD-10-CM

## 2023-06-03 ENCOUNTER — Emergency Department (HOSPITAL_COMMUNITY)

## 2023-06-03 ENCOUNTER — Other Ambulatory Visit: Payer: Self-pay

## 2023-06-03 ENCOUNTER — Inpatient Hospital Stay

## 2023-06-03 ENCOUNTER — Ambulatory Visit: Payer: Self-pay | Admitting: Cardiovascular Disease

## 2023-06-03 ENCOUNTER — Other Ambulatory Visit: Payer: Self-pay | Admitting: Hematology and Oncology

## 2023-06-03 ENCOUNTER — Observation Stay (HOSPITAL_COMMUNITY)
Admission: EM | Admit: 2023-06-03 | Discharge: 2023-06-04 | Disposition: A | Discharge status: Home / Self Care | Attending: Internal Medicine | Admitting: Internal Medicine

## 2023-06-03 ENCOUNTER — Encounter (HOSPITAL_COMMUNITY): Payer: Self-pay | Admitting: Internal Medicine

## 2023-06-03 ENCOUNTER — Inpatient Hospital Stay (HOSPITAL_COMMUNITY)

## 2023-06-03 ENCOUNTER — Emergency Department (HOSPITAL_COMMUNITY)
Admit: 2023-06-03 | Discharge: 2023-06-03 | Disposition: A | Discharge status: Home / Self Care | Attending: Emergency Medicine

## 2023-06-03 VITALS — BP 156/88 | HR 115 | Temp 100.7°F | Resp 22 | Wt 170.5 lb

## 2023-06-03 DIAGNOSIS — Z96642 Presence of left artificial hip joint: Secondary | ICD-10-CM | POA: Diagnosis not present

## 2023-06-03 DIAGNOSIS — D693 Immune thrombocytopenic purpura: Secondary | ICD-10-CM | POA: Diagnosis not present

## 2023-06-03 DIAGNOSIS — M329 Systemic lupus erythematosus, unspecified: Secondary | ICD-10-CM | POA: Diagnosis present

## 2023-06-03 DIAGNOSIS — E210 Primary hyperparathyroidism: Secondary | ICD-10-CM | POA: Diagnosis present

## 2023-06-03 DIAGNOSIS — Z7901 Long term (current) use of anticoagulants: Secondary | ICD-10-CM | POA: Diagnosis not present

## 2023-06-03 DIAGNOSIS — A419 Sepsis, unspecified organism: Secondary | ICD-10-CM | POA: Diagnosis not present

## 2023-06-03 DIAGNOSIS — R509 Fever, unspecified: Principal | ICD-10-CM

## 2023-06-03 DIAGNOSIS — Z8673 Personal history of transient ischemic attack (TIA), and cerebral infarction without residual deficits: Secondary | ICD-10-CM

## 2023-06-03 DIAGNOSIS — Z79899 Other long term (current) drug therapy: Secondary | ICD-10-CM | POA: Diagnosis not present

## 2023-06-03 DIAGNOSIS — I10 Essential (primary) hypertension: Secondary | ICD-10-CM | POA: Insufficient documentation

## 2023-06-03 DIAGNOSIS — Z96652 Presence of left artificial knee joint: Secondary | ICD-10-CM | POA: Diagnosis not present

## 2023-06-03 DIAGNOSIS — R600 Localized edema: Secondary | ICD-10-CM | POA: Diagnosis present

## 2023-06-03 DIAGNOSIS — D72819 Decreased white blood cell count, unspecified: Secondary | ICD-10-CM | POA: Insufficient documentation

## 2023-06-03 DIAGNOSIS — R609 Edema, unspecified: Secondary | ICD-10-CM

## 2023-06-03 LAB — CMP (CANCER CENTER ONLY)
ALT: 10 U/L (ref 0–44)
AST: 18 U/L (ref 15–41)
Albumin: 4.4 g/dL (ref 3.5–5.0)
Alkaline Phosphatase: 115 U/L (ref 38–126)
Anion gap: 6 (ref 5–15)
BUN: 13 mg/dL (ref 8–23)
CO2: 31 mmol/L (ref 22–32)
Calcium: 9.6 mg/dL (ref 8.9–10.3)
Chloride: 103 mmol/L (ref 98–111)
Creatinine: 0.87 mg/dL (ref 0.44–1.00)
GFR, Estimated: >60 mL/min (ref >60)
Glucose, Bld: 110 mg/dL — ABNORMAL HIGH (ref 70–99)
Potassium: 3.9 mmol/L (ref 3.5–5.1)
Sodium: 140 mmol/L (ref 135–145)
Total Bilirubin: 1 mg/dL (ref 0.0–1.2)
Total Protein: 7.5 g/dL (ref 6.5–8.1)

## 2023-06-03 LAB — COMPREHENSIVE METABOLIC PANEL WITH GFR
ALT: 11 U/L (ref 0–44)
AST: 23 U/L (ref 15–41)
Albumin: 4 g/dL (ref 3.5–5.0)
Alkaline Phosphatase: 107 U/L (ref 38–126)
Anion gap: 9 (ref 5–15)
BUN: 14 mg/dL (ref 8–23)
CO2: 23 mmol/L (ref 22–32)
Calcium: 9.3 mg/dL (ref 8.9–10.3)
Chloride: 104 mmol/L (ref 98–111)
Creatinine, Ser: 0.81 mg/dL (ref 0.44–1.00)
GFR, Estimated: >60 mL/min (ref >60)
Glucose, Bld: 122 mg/dL — ABNORMAL HIGH (ref 70–99)
Potassium: 3.6 mmol/L (ref 3.5–5.1)
Sodium: 136 mmol/L (ref 135–145)
Total Bilirubin: 1 mg/dL (ref 0.0–1.2)
Total Protein: 7.4 g/dL (ref 6.5–8.1)

## 2023-06-03 LAB — BRAIN NATRIURETIC PEPTIDE: B Natriuretic Peptide: 24.2 pg/mL (ref 0.0–100.0)

## 2023-06-03 LAB — CBC WITH DIFFERENTIAL (CANCER CENTER ONLY)
Abs Immature Granulocytes: 0.01 10*3/uL (ref 0.00–0.07)
Basophils Absolute: 0 10*3/uL (ref 0.0–0.1)
Basophils Relative: 1 %
Eosinophils Absolute: 0.1 10*3/uL (ref 0.0–0.5)
Eosinophils Relative: 2 %
HCT: 39.1 % (ref 36.0–46.0)
Hemoglobin: 13 g/dL (ref 12.0–15.0)
Immature Granulocytes: 0 %
Lymphocytes Relative: 19 %
Lymphs Abs: 1 10*3/uL (ref 0.7–4.0)
MCH: 29.7 pg (ref 26.0–34.0)
MCHC: 33.2 g/dL (ref 30.0–36.0)
MCV: 89.3 fL (ref 80.0–100.0)
Monocytes Absolute: 0.5 10*3/uL (ref 0.1–1.0)
Monocytes Relative: 11 %
Neutro Abs: 3.5 10*3/uL (ref 1.7–7.7)
Neutrophils Relative %: 67 %
Platelet Count: 222 10*3/uL (ref 150–400)
RBC: 4.38 MIL/uL (ref 3.87–5.11)
RDW: 15.5 % (ref 11.5–15.5)
WBC Count: 5.1 10*3/uL (ref 4.0–10.5)
nRBC: 0 % (ref 0.0–0.2)

## 2023-06-03 LAB — CBC WITH DIFFERENTIAL/PLATELET
Abs Immature Granulocytes: 0.01 10*3/uL (ref 0.00–0.07)
Basophils Absolute: 0 10*3/uL (ref 0.0–0.1)
Basophils Relative: 0 %
Eosinophils Absolute: 0 10*3/uL (ref 0.0–0.5)
Eosinophils Relative: 1 %
HCT: 39.8 % (ref 36.0–46.0)
Hemoglobin: 13 g/dL (ref 12.0–15.0)
Immature Granulocytes: 0 %
Lymphocytes Relative: 11 %
Lymphs Abs: 0.5 10*3/uL — ABNORMAL LOW (ref 0.7–4.0)
MCH: 30 pg (ref 26.0–34.0)
MCHC: 32.7 g/dL (ref 30.0–36.0)
MCV: 91.7 fL (ref 80.0–100.0)
Monocytes Absolute: 0.3 10*3/uL (ref 0.1–1.0)
Monocytes Relative: 5 %
Neutro Abs: 4.1 10*3/uL (ref 1.7–7.7)
Neutrophils Relative %: 83 %
Platelets: 215 10*3/uL (ref 150–400)
RBC: 4.34 MIL/uL (ref 3.87–5.11)
RDW: 15.6 % — ABNORMAL HIGH (ref 11.5–15.5)
WBC: 4.9 10*3/uL (ref 4.0–10.5)
nRBC: 0 % (ref 0.0–0.2)

## 2023-06-03 LAB — URINALYSIS, W/ REFLEX TO CULTURE (INFECTION SUSPECTED)
Bacteria, UA: NONE SEEN
Bilirubin Urine: NEGATIVE
Glucose, UA: NEGATIVE mg/dL
Hgb urine dipstick: NEGATIVE
Ketones, ur: NEGATIVE mg/dL
Nitrite: NEGATIVE
Protein, ur: NEGATIVE mg/dL
Specific Gravity, Urine: 1.012 (ref 1.005–1.030)
pH: 6 (ref 5.0–8.0)

## 2023-06-03 LAB — I-STAT CHEM 8, ED
BUN: 14 mg/dL (ref 8–23)
Calcium, Ion: 1.19 mmol/L (ref 1.15–1.40)
Chloride: 106 mmol/L (ref 98–111)
Creatinine, Ser: 0.8 mg/dL (ref 0.44–1.00)
Glucose, Bld: 122 mg/dL — ABNORMAL HIGH (ref 70–99)
HCT: 40 % (ref 36.0–46.0)
Hemoglobin: 13.6 g/dL (ref 12.0–15.0)
Potassium: 3.8 mmol/L (ref 3.5–5.1)
Sodium: 138 mmol/L (ref 135–145)
TCO2: 26 mmol/L (ref 22–32)

## 2023-06-03 LAB — RETIC PANEL
Immature Retic Fract: 25.6 % — ABNORMAL HIGH (ref 2.3–15.9)
RBC.: 4.39 MIL/uL (ref 3.87–5.11)
Retic Count, Absolute: 79.9 10*3/uL (ref 19.0–186.0)
Retic Ct Pct: 1.8 % (ref 0.4–3.1)
Reticulocyte Hemoglobin: 31 pg (ref >27.9)

## 2023-06-03 LAB — RESP PANEL BY RT-PCR (RSV, FLU A&B, COVID)  RVPGX2
Influenza A by PCR: NEGATIVE
Influenza B by PCR: NEGATIVE
Resp Syncytial Virus by PCR: NEGATIVE
SARS Coronavirus 2 by RT PCR: NEGATIVE

## 2023-06-03 LAB — TROPONIN I (HIGH SENSITIVITY)
Troponin I (High Sensitivity): 5 ng/L (ref <18)
Troponin I (High Sensitivity): 5 ng/L (ref <18)

## 2023-06-03 MED ORDER — SODIUM CHLORIDE 0.9 % IV BOLUS
500.0000 mL | Freq: Once | INTRAVENOUS | Status: AC
  Administered 2023-06-03: 500 mL via INTRAVENOUS

## 2023-06-03 MED ORDER — ACETAMINOPHEN 325 MG PO TABS
650.0000 mg | ORAL_TABLET | Freq: Once | ORAL | Status: AC
  Administered 2023-06-03: 650 mg via ORAL
  Filled 2023-06-03: qty 2

## 2023-06-03 MED ORDER — SODIUM CHLORIDE 0.9 % IV SOLN
1000.0000 mg | Freq: Once | INTRAVENOUS | Status: AC
  Administered 2023-06-03: 1000 mg via INTRAVENOUS
  Filled 2023-06-03: qty 100

## 2023-06-03 MED ORDER — SODIUM CHLORIDE 0.9 % IV SOLN: INTRAVENOUS | Status: DC

## 2023-06-03 MED ORDER — FAMOTIDINE IN NACL 20-0.9 MG/50ML-% IV SOLN
20.0000 mg | Freq: Once | INTRAVENOUS | Status: AC
  Administered 2023-06-03: 20 mg via INTRAVENOUS
  Filled 2023-06-03: qty 50

## 2023-06-03 MED ORDER — ACETAMINOPHEN 500 MG PO TABS
1000.0000 mg | ORAL_TABLET | Freq: Once | ORAL | Status: AC
  Administered 2023-06-03: 1000 mg via ORAL
  Filled 2023-06-03: qty 2

## 2023-06-03 MED ORDER — DIPHENHYDRAMINE HCL 25 MG PO CAPS
50.0000 mg | ORAL_CAPSULE | Freq: Once | ORAL | Status: AC
  Administered 2023-06-03: 50 mg via ORAL
  Filled 2023-06-03: qty 2

## 2023-06-03 MED ORDER — METHYLPREDNISOLONE SODIUM SUCC 125 MG IJ SOLR
125.0000 mg | Freq: Once | INTRAMUSCULAR | Status: AC
  Administered 2023-06-03: 125 mg via INTRAVENOUS
  Filled 2023-06-03: qty 2

## 2023-06-03 MED ORDER — ALPRAZOLAM 0.25 MG PO TABS
0.2500 mg | ORAL_TABLET | Freq: Once | ORAL | Status: AC
  Administered 2023-06-03: 0.25 mg via ORAL
  Filled 2023-06-03: qty 1

## 2023-06-03 MED ORDER — IOHEXOL 350 MG/ML SOLN
75.0000 mL | Freq: Once | INTRAVENOUS | Status: AC | PRN
  Administered 2023-06-03: 75 mL via INTRAVENOUS

## 2023-06-03 MED ORDER — MONTELUKAST SODIUM 10 MG PO TABS
10.0000 mg | ORAL_TABLET | Freq: Once | ORAL | Status: AC
  Administered 2023-06-03: 10 mg via ORAL
  Filled 2023-06-03: qty 1

## 2023-06-03 MED ORDER — VANCOMYCIN HCL 1750 MG/350ML IV SOLN
1750.0000 mg | Freq: Once | INTRAVENOUS | Status: AC
  Administered 2023-06-03: 1750 mg via INTRAVENOUS
  Filled 2023-06-03: qty 350

## 2023-06-03 MED ORDER — SODIUM CHLORIDE 0.9 % IV SOLN
2.0000 g | Freq: Once | INTRAVENOUS | Status: AC
  Administered 2023-06-03: 2 g via INTRAVENOUS
  Filled 2023-06-03: qty 12.5

## 2023-06-03 NOTE — Patient Instructions (Signed)
 [facility]  Discharge Instructions: Thank you for choosing [facility] to provide your oncology and hematology care.   If you have a lab appointment with the Cancer Center, please go directly to the Cancer Center and check in at the registration area.   Wear comfortable clothing and clothing appropriate for easy access to any Portacath or PICC line.   We strive to give you quality time with your provider. You may need to reschedule your appointment if you arrive late (15 or more minutes).  Arriving late affects you and other patients whose appointments are after yours.  Also, if you miss three or more appointments without notifying the office, you may be dismissed from the clinic at the provider's discretion.      For prescription refill requests, have your pharmacy contact our office and allow 72 hours for refills to be completed.    Today you received the following chemotherapy and/or immunotherapy agents Rituximab      To help prevent nausea and vomiting after your treatment, we encourage you to take your nausea medication as directed.  BELOW ARE SYMPTOMS THAT SHOULD BE REPORTED IMMEDIATELY: *FEVER GREATER THAN 100.4 F (38 C) OR HIGHER *CHILLS OR SWEATING *NAUSEA AND VOMITING THAT IS NOT CONTROLLED WITH YOUR NAUSEA MEDICATION *UNUSUAL SHORTNESS OF BREATH *UNUSUAL BRUISING OR BLEEDING *URINARY PROBLEMS (pain or burning when urinating, or frequent urination) *BOWEL PROBLEMS (unusual diarrhea, constipation, pain near the anus) TENDERNESS IN MOUTH AND THROAT WITH OR WITHOUT PRESENCE OF ULCERS (sore throat, sores in mouth, or a toothache) UNUSUAL RASH, SWELLING OR PAIN  UNUSUAL VAGINAL DISCHARGE OR ITCHING   Items with * indicate a potential emergency and should be followed up as soon as possible or go to the Emergency Department if any problems should occur.  Please show the CHEMOTHERAPY ALERT CARD or IMMUNOTHERAPY  ALERT CARD at check-in to the Emergency Department and triage nurse.  Should you have questions after your visit or need to cancel or reschedule your appointment, please contact [facility]  Dept: [phone]  and follow the prompts.  Office hours are 8:00 a.m. to 4:30 p.m. Monday - Friday. Please note that voicemails left after 4:00 p.m. may not be returned until the following business day.  We are closed weekends and major holidays. You have access to a nurse at all times for urgent questions. Please call the main number to the clinic Dept: [phone] and follow the prompts.   For any non-urgent questions, you may also contact your provider using MyChart. We now offer e-Visits for anyone 78 and older to request care online for non-urgent symptoms. For details visit [email or web address].   Also download the MyChart app! Go to the app store, search "MyChart", open the app, select [place], and log in with your MyChart username and password.

## 2023-06-03 NOTE — ED Provider Notes (Signed)
 [place] EMERGENCY DEPARTMENT AT [facility] Provider Note   CSN: [id number] Arrival date & time: 06/03/23  1556     History  No chief complaint on file.   Elaine Allen is a 68 y.o. female.  68 year old female with prior medical history as detailed below presents for evaluation.  Patient presents from cancer center infusion clinic.  Patient was noted to be febrile there with a temperature of 100.7.  She is tachycardic to the 130s.  Patient complains of 2 weeks of cough.  She complains of malaise and fatigue.  She reports mildly increased bilateral lower extremity edema.  Patient with hx of ITP, SLE - on rituximab , methotrexate - HTN, CVA.  The history is provided by the patient.       Home Medications Prior to Admission medications   Medication Sig Start Date End Date Taking? Authorizing Provider  alum & mag hydroxide-simeth (MAALOX/MYLANTA) 200-200-20 MG/5ML suspension Take 30 mLs by mouth every 4 (four) hours as needed for indigestion or heartburn. 03/20/22   Swayze, Ava, DO  amLODipine  (NORVASC ) 10 MG tablet Take 10 mg by mouth daily. 03/16/22   [provider]  atorvastatin (LIPITOR) 40 MG tablet Take 40 mg by mouth daily.    [provider]  cholecalciferol  (VITAMIN D3) 25 MCG (1000 UNIT) tablet Take 1,000 Units by mouth daily.    [provider]  loratadine  (CLARITIN ) 10 MG tablet Take 1 tablet (10 mg total) by mouth daily. 03/21/22   Swayze, Ava, DO  losartan -hydrochlorothiazide  (HYZAAR) 100-25 MG tablet Take 1 tablet by mouth daily. 06/23/21   Adelia Homestead, MD  Multiple Vitamin (MULTIVITAMIN WITH MINERALS) TABS tablet Take 1 tablet by mouth daily.    [provider]  potassium chloride  SA (KLOR-CON  M) 20 MEQ tablet Take 1 tablet (20 mEq total) by mouth daily. 10/16/22   Frankie Israel, MD  Probiotic Product (PROBIOTIC PO) Take 1 capsule by mouth daily.    [provider]  vitamin B-12  (CYANOCOBALAMIN ) 1000 MCG tablet Take 1,000 mcg by mouth daily.    [provider]      Allergies    Plaquenil [hydroxychloroquine sulfate], Amoxicillin , Azithromycin , Lactose intolerance (gi), Penicillin g, and Hydroxychloroquine sulfate    Review of Systems   Review of Systems  All other systems reviewed and are negative.   Physical Exam Updated Vital Signs There were no vitals taken for this visit. Physical Exam Vitals and nursing note reviewed.  Constitutional:      General: She is not in acute distress.    Appearance: Normal appearance. She is well-developed.  HENT:     Head: Normocephalic and atraumatic.  Eyes:     Conjunctiva/sclera: Conjunctivae normal.     Pupils: Pupils are equal, round, and reactive to light.  Cardiovascular:     Rate and Rhythm: Regular rhythm. Tachycardia present.     Heart sounds: Normal heart sounds.  Pulmonary:     Effort: Pulmonary effort is normal. No respiratory distress.     Breath sounds: Normal breath sounds.  Abdominal:     General: There is no distension.     Palpations: Abdomen is soft.     Tenderness: There is no abdominal tenderness.  Musculoskeletal:        General: No deformity. Normal range of motion.     Cervical back: Normal range of motion and neck supple.     Right lower leg: Edema present.     Left lower leg: Edema present.  Skin:    General: Skin is warm and dry.  Neurological:     General: No focal deficit present.     Mental Status: She is alert and oriented to person, place, and time.     ED Results / Procedures / Treatments   Labs (all labs ordered are listed, but only abnormal results are displayed) Labs Reviewed  CULTURE, BLOOD (ROUTINE X 2)  CULTURE, BLOOD (ROUTINE X 2)  RESP PANEL BY RT-PCR (RSV, FLU A&B, COVID)  RVPGX2  COMPREHENSIVE METABOLIC PANEL WITH GFR  CBC WITH DIFFERENTIAL/PLATELET  BRAIN NATRIURETIC PEPTIDE  URINALYSIS, W/ REFLEX TO CULTURE (INFECTION SUSPECTED)  I-STAT CG4 LACTIC  ACID, ED  I-STAT CHEM 8, ED  TROPONIN I (HIGH SENSITIVITY)    EKG None  Radiology No results found.  Procedures Procedures    Medications Ordered in ED Medications  acetaminophen  (TYLENOL ) tablet 1,000 mg (has no administration in time range)  sodium chloride  0.9 % bolus 500 mL (has no administration in time range)    ED Course/ Medical Decision Making/ A&P                                 Medical Decision Making Amount and/or Complexity of Data Reviewed Labs: ordered. Radiology: ordered.  Risk OTC drugs. Prescription drug management. Decision regarding hospitalization.    Medical Screen Complete  This patient presented to the ED with complaint of fever, tachycardia.  This complaint involves an extensive number of treatment options. The initial differential diagnosis includes, but is not limited to, sepsis, SIRS, pneumonia, other infection, metabolic abnormality  This presentation is: Acute, Self-Limited, Previously Undiagnosed, Uncertain Prognosis, Complicated, Systemic Symptoms, and Threat to Life/Bodily Function  Patient with history of SLE, hypertension, stroke, ITP.  Patient was seen in the infusion clinic prior to arrival and noted to be tachycardic to the 130s with a temperature of 100.7.  She was sent to the ED for evaluation.    Patient complains of mild cough and weakness.   Given immunosuppression and fever and tachycardia broad-spectrum antibiotics administered.  Workup on the whole was without clear indication of source of infection.  Patient feels improved after treatment.  Patient would benefit from admission for further workup and evaluation.  Hospitalist service made aware of case.  Additional history obtained:  External records from outside sources obtained and reviewed including prior ED visits and prior Inpatient records.   Problem List / ED Course:  Fever   Disposition:  After consideration of the diagnostic results and the  patients response to treatment, I feel that the patent would benefit from admission.   CRITICAL CARE Performed by: Burnette Carte   Total critical care time: 30 minutes  Critical care time was exclusive of separately billable procedures and treating other patients.  Critical care was necessary to treat or prevent imminent or life-threatening deterioration.  Critical care was time spent personally by me on the following activities: development of treatment plan with patient and/or surrogate as well as nursing, discussions with consultants, evaluation of patient's response to treatment, examination of patient, obtaining history from patient or surrogate, ordering and performing treatments and interventions, ordering and review of laboratory studies, ordering and review of radiographic studies, pulse oximetry and re-evaluation of patient's condition.          Final Clinical Impression(s) / ED Diagnoses Final diagnoses:  Fever, unspecified fever cause    Rx / DC Orders ED Discharge Orders  None         Burnette Carte, MD 06/03/23 2202

## 2023-06-03 NOTE — ED Notes (Signed)
 Patient denies pain and is resting comfortably.

## 2023-06-03 NOTE — ED Notes (Signed)
 Pt walked to the restroom with nt assistance

## 2023-06-03 NOTE — ED Notes (Signed)
 Asked provider for medication for restless legs, per pt request

## 2023-06-03 NOTE — Progress Notes (Signed)
 ED Pharmacy Antibiotic Sign Off An antibiotic consult was received from an ED provider for vancomycin  and cefepime per pharmacy dosing for sepsis. A chart review was completed to assess appropriateness.   Allergy to penicillin - itching, nausea  The following one time order(s) were placed:  Vancomycin  1750 mg Cefepime 2 g   Further antibiotic and/or antibiotic pharmacy consults should be ordered by the admitting provider if indicated.   Thank you for allowing pharmacy to be a part of this patient's care.   Lolita Rise, PharmD, BCPS Clinical Pharmacist 06/03/2023 4:40 PM

## 2023-06-03 NOTE — H&P (Signed)
 History and Physical    Patient: Elaine Allen ZOX:[id number] DOB: 15-Feb-1955 DOA: 06/03/2023 DOS: the patient was seen and examined on 06/03/2023 PCP: Maryellen Snare, NP  Patient coming from: Home  Chief Complaint:  Chief Complaint  Patient presents with   Fever    Pt arrives via staff from cancer center for fever of 99.13F-100.82F during treatment. Pt denies pain, sob, or cp. States the top of her head has been tingling for the last three days,but has had episodes like this in the past during coughing spells and believes this is related to allergies. Pt in nad in bed   HPI: Elaine Allen is a 68 y.o. female with medical history significant for lupus, relapsing ITP related to lupus, and stroke due to lupus anticoagulant.  He also has chronic leukopenia and neutropenia likely related to lupus. He was in the infusion clinic receiving her Rituxan  infusion when they noted that she was having a low-grade fever of 100.7.  Patient not aware that she had a fever.  Her infusion was not completed she was sent down to the ED for evaluation.  The patient has been having trouble with a dry cough for the last 2 weeks.  She has a chronically stuffy nose.  She has noticed some postnasal drip in the last 2 days.  She denies any headache, no diarrhea or constipation or abdominal pain. In the emergency department her temperature was 100.7 and her heart rate was in the 120s.  Her respiratory rate was also elevated at 25.  She was started empirically on vancomycin  and cefepime because she is immunocompromised and the hospitalist have been asked to admit.  Her workup so far has included a CTA of the chest which was negative for any evidence of PE or infection.  Her vitals are now stable and she will be admitted to the hospitalist service. She was not put on the sepsis pathway so the patient did not have blood cultures or lactic acid drawn. No sepsis level fluids. She was COVID, flu, and RSV  negative.    Review of Systems: As mentioned in the history of present illness. All other systems reviewed and are negative. Past Medical History:  Diagnosis Date   Arthritis    Blind right eye    sees colors   Chronic anemia    Detached retina, right    corrected good for about 6 months sees peripheral colors only   History of kidney stones    Hypertension    Hyperthyroidism    Lupus    Pneumonia    Retinal detachment    Spasmodic dysphonia    Stroke (HCC) 2015   no permanant defecits had weakness in left arm in hospital Had PT also   Vertigo    Hx of preceding stroke   Past Surgical History:  Procedure Laterality Date   ABDOMINAL HYSTERECTOMY     BREAST EXCISIONAL BIOPSY Left    KNEE SURGERY Left    THYROID  SURGERY     TOTAL HIP ARTHROPLASTY Left 06/27/2018   Procedure: TOTAL HIP ARTHROPLASTY ANTERIOR APPROACH;  Surgeon: Wendolyn Hamburger, MD;  Location: [facility];  Service: Orthopedics;  Laterality: Left;   Social History:  reports that she has never smoked. She has never been exposed to tobacco smoke. She has never used smokeless tobacco. She reports that she does not drink alcohol and does not use drugs.  Allergies  Allergen Reactions   Plaquenil [Hydroxychloroquine Sulfate] Anaphylaxis and Other (See Comments)  Blisters all over body   Amoxicillin  Other (See Comments)   Azithromycin  Nausea And Vomiting   Lactose Intolerance (Gi) Other (See Comments)    Upset stomach   Penicillin G Itching and Nausea Only    Did it involve swelling of the face/tongue/throat, SOB, or low BP? No No  Did it involve sudden or severe rash/hives, skin peeling, or any reaction on the inside of your mouth or nose? Np No  Did you need to seek medical attention at a hospital or doctor's office? Yes Yes  When did it last happen? around 2010  If all above answers are "NO", may proceed with cephalosporin use.   Hydroxychloroquine Sulfate Other (See Comments) and Rash    Family History   Problem Relation Age of Onset   Cancer Mother        liver   Diabetes Mother    Arthritis Mother    Cancer Father        prostate,METS, stomach    Ovarian cancer Sister    Throat cancer Brother     Prior to Admission medications   Medication Sig Start Date End Date Taking? Authorizing Provider  amLODipine  (NORVASC ) 10 MG tablet Take 10 mg by mouth daily. 03/16/22  Yes [provider]  cholecalciferol  (VITAMIN D3) 25 MCG (1000 UNIT) tablet Take 1,000 Units by mouth daily.   Yes [provider]  folic acid (FOLVITE) 1 MG tablet Take 1 tablet by mouth daily. 03/11/23 03/10/24 Yes [provider]  losartan -hydrochlorothiazide  (HYZAAR) 100-25 MG tablet Take 1 tablet by mouth daily. 06/23/21  Yes Adelia Homestead, MD  methotrexate (RHEUMATREX) 2.5 MG tablet Take 7.5 mg by mouth once a week. Pt usually takes this medication on fridays 03/11/23  Yes [provider]  tacrolimus (PROTOPIC) 0.1 % ointment 1 Application. 12/01/22  Yes [provider]  vitamin B-12 (CYANOCOBALAMIN ) 1000 MCG tablet Take 1,000 mcg by mouth daily.   Yes [provider]  alum & mag hydroxide-simeth (MAALOX/MYLANTA) 200-200-20 MG/5ML suspension Take 30 mLs by mouth every 4 (four) hours as needed for indigestion or heartburn. 03/20/22   Swayze, Ava, DO  atorvastatin (LIPITOR) 40 MG tablet Take 40 mg by mouth daily. Patient not taking: Reported on 06/03/2023    [provider]  loratadine  (CLARITIN ) 10 MG tablet Take 1 tablet (10 mg total) by mouth daily. 03/21/22   Swayze, Ava, DO  Multiple Vitamin (MULTIVITAMIN WITH MINERALS) TABS tablet Take 1 tablet by mouth daily. Patient not taking: Reported on 06/03/2023    [provider]  potassium chloride  SA (KLOR-CON  M) 20 MEQ tablet Take 1 tablet (20 mEq total) by mouth daily. Patient not taking: Reported on 06/03/2023 10/16/22   Frankie Israel, MD  Probiotic Product (PROBIOTIC PO) Take 1 capsule by mouth  daily. Patient not taking: Reported on 06/03/2023    [provider]    Physical Exam: Vitals:   06/03/23 2030 06/03/23 2115 06/03/23 2130 06/03/23 2229  BP: (!) 141/90 (!) 136/92 131/82   Pulse: 85 88 76   Resp: (!) 25 19 (!) 21   Temp:    97.8 F (36.6 C)  TempSrc:    Oral  SpO2: 98% 97% 98%    Physical Exam:  General: No acute distress, well developed, well nourished HEENT: Normocephalic, atraumatic, PERRL. bald Cardiovascular: Normal rate and rhythm. Distal pulses intact. Pulmonary: Normal pulmonary effort, normal breath sounds Gastrointestinal: Nondistended abdomen, soft, non-tender, hypoactive bowel sounds Musculoskeletal:Normal ROM, no lower ext edema Skin: Skin  is warm and dry. Neuro: No focal deficits noted, AAOx3. PSYCH: Attentive and cooperative  Data Reviewed:  Results for orders placed or performed during the hospital encounter of 06/03/23 (from the past 24 hours)  Comprehensive metabolic panel     Status: Abnormal   Collection Time: 06/03/23  4:20 PM  Result Value Ref Range   Sodium 136 135 - 145 mmol/L   Potassium 3.6 3.5 - 5.1 mmol/L   Chloride 104 98 - 111 mmol/L   CO2 23 22 - 32 mmol/L   Glucose, Bld 122 (H) 70 - 99 mg/dL   BUN 14 8 - 23 mg/dL   Creatinine, Ser 1.32 0.44 - 1.00 mg/dL   Calcium 9.3 8.9 - 44.0 mg/dL   Total Protein 7.4 6.5 - 8.1 g/dL   Albumin 4.0 3.5 - 5.0 g/dL   AST 23 15 - 41 U/L   ALT 11 0 - 44 U/L   Alkaline Phosphatase 107 38 - 126 U/L   Total Bilirubin 1.0 0.0 - 1.2 mg/dL   GFR, Estimated >10 >27 mL/min   Anion gap 9 5 - 15  CBC with Differential     Status: Abnormal   Collection Time: 06/03/23  4:20 PM  Result Value Ref Range   WBC 4.9 4.0 - 10.5 K/uL   RBC 4.34 3.87 - 5.11 MIL/uL   Hemoglobin 13.0 12.0 - 15.0 g/dL   HCT 25.3 66.4 - 40.3 %   MCV 91.7 80.0 - 100.0 fL   MCH 30.0 26.0 - 34.0 pg   MCHC 32.7 30.0 - 36.0 g/dL   RDW 47.4 (H) 25.9 - 56.3 %   Platelets 215 150 - 400 K/uL   nRBC 0.0 0.0 - 0.2 %    Neutrophils Relative % 83 %   Neutro Abs 4.1 1.7 - 7.7 K/uL   Lymphocytes Relative 11 %   Lymphs Abs 0.5 (L) 0.7 - 4.0 K/uL   Monocytes Relative 5 %   Monocytes Absolute 0.3 0.1 - 1.0 K/uL   Eosinophils Relative 1 %   Eosinophils Absolute 0.0 0.0 - 0.5 K/uL   Basophils Relative 0 %   Basophils Absolute 0.0 0.0 - 0.1 K/uL   Immature Granulocytes 0 %   Abs Immature Granulocytes 0.01 0.00 - 0.07 K/uL  Brain natriuretic peptide     Status: None   Collection Time: 06/03/23  4:20 PM  Result Value Ref Range   B Natriuretic Peptide 24.2 0.0 - 100.0 pg/mL  Resp panel by RT-PCR (RSV, Flu A&B, Covid)     Status: None   Collection Time: 06/03/23  4:20 PM   Specimen: Nasal Swab  Result Value Ref Range   SARS Coronavirus 2 by RT PCR NEGATIVE NEGATIVE   Influenza A by PCR NEGATIVE NEGATIVE   Influenza B by PCR NEGATIVE NEGATIVE   Resp Syncytial Virus by PCR NEGATIVE NEGATIVE  Troponin I (High Sensitivity)     Status: None   Collection Time: 06/03/23  4:20 PM  Result Value Ref Range   Troponin I (High Sensitivity) 5 <18 ng/L  I-stat chem 8, ED     Status: Abnormal   Collection Time: 06/03/23  4:59 PM  Result Value Ref Range   Sodium 138 135 - 145 mmol/L   Potassium 3.8 3.5 - 5.1 mmol/L   Chloride 106 98 - 111 mmol/L   BUN 14 8 - 23 mg/dL   Creatinine, Ser 8.75 0.44 - 1.00 mg/dL   Glucose, Bld 643 (H) 70 - 99 mg/dL  Calcium, Ion 1.19 1.15 - 1.40 mmol/L   TCO2 26 22 - 32 mmol/L   Hemoglobin 13.6 12.0 - 15.0 g/dL   HCT 38.1 82.9 - 93.7 %  Urinalysis, w/ Reflex to Culture (Infection Suspected) -Urine, Clean Catch     Status: Abnormal   Collection Time: 06/03/23  6:16 PM  Result Value Ref Range   Specimen Source URINE, CLEAN CATCH    Color, Urine YELLOW YELLOW   APPearance CLEAR CLEAR   Specific Gravity, Urine 1.012 1.005 - 1.030   pH 6.0 5.0 - 8.0   Glucose, UA NEGATIVE NEGATIVE mg/dL   Hgb urine dipstick NEGATIVE NEGATIVE   Bilirubin Urine NEGATIVE NEGATIVE   Ketones, ur NEGATIVE  NEGATIVE mg/dL   Protein, ur NEGATIVE NEGATIVE mg/dL   Nitrite NEGATIVE NEGATIVE   Leukocytes,Ua MODERATE (A) NEGATIVE   RBC / HPF 0-5 0 - 5 RBC/hpf   WBC, UA 0-5 0 - 5 WBC/hpf   Bacteria, UA NONE SEEN NONE SEEN   Squamous Epithelial / HPF 0-5 0 - 5 /HPF  Troponin I (High Sensitivity)     Status: None   Collection Time: 06/03/23  7:02 PM  Result Value Ref Range   Troponin I (High Sensitivity) 5 <18 ng/L     Assessment and Plan: Infection of unclear etiology  in immunocompromised patient - COVID, flu, RSV negative - CTA of chest negative - UA negative for infection - Will check CT of sinuses given her nasal congestion and postnasal drip symptoms - Empiric vancomycin  and cefepime given in ED Will continue Cefepime for now. - Blood cultures may not have been drawn prior to antibiotics  2. Lupus/ Relapsing ITP/ Lupus anticoagulant  -Continue outpatient meds - Her Rituxan  infusion will need to be rescheduled   Advance Care Planning:   Code Status: Prior the patient names her daughter as her surrogate decision maker and she wants to be full code.  Consults: None Family Communication: None  Severity of Illness: The appropriate patient status for this patient is INPATIENT. Inpatient status is judged to be reasonable and necessary in order to provide the required intensity of service to ensure the patient's safety. The patient's presenting symptoms, physical exam findings, and initial radiographic and laboratory data in the context of their chronic comorbidities is felt to place them at high risk for further clinical deterioration. Furthermore, it is not anticipated that the patient will be medically stable for discharge from the hospital within 2 midnights of admission.   * I certify that at the point of admission it is my clinical judgment that the patient will require inpatient hospital care spanning beyond 2 midnights from the point of admission due to high intensity of service, high  risk for further deterioration and high frequency of surveillance required.*  Author: Willadean Hark, MD 06/03/2023 11:14 PM  For on call review [email or web address].

## 2023-06-03 NOTE — Progress Notes (Signed)
 Bilateral lower extremity venous duplex has been completed. Preliminary results can be found in CV Proc through chart review.  Results were given to Dr. Reba Camper.  06/03/23 5:18 PM Birda Buffy RVT

## 2023-06-03 NOTE — Progress Notes (Signed)
 Dorsey MD came to chairside to assess pt, pt reporting productive cough X3 weeks. HR elevated at 132, temp 99.8. BP 148/88. Per MD, ok to treat, pt being sent for chest XRAY after treatment.

## 2023-06-03 NOTE — Progress Notes (Signed)
 Pt temperature 100.7, c/o cough, See Thornton Flock, RN note 06/03/23. MD notified of most recent VS, per MD, pt needs to go to ED for further evaluation. Report called to ED, report given by Thornton Flock, RN to ED nurse. R FA PIV c/d/I.

## 2023-06-04 ENCOUNTER — Encounter: Payer: Self-pay | Admitting: Hematology

## 2023-06-04 ENCOUNTER — Other Ambulatory Visit (HOSPITAL_COMMUNITY): Payer: Self-pay

## 2023-06-04 ENCOUNTER — Other Ambulatory Visit: Payer: Self-pay

## 2023-06-04 DIAGNOSIS — A419 Sepsis, unspecified organism: Secondary | ICD-10-CM | POA: Diagnosis not present

## 2023-06-04 DIAGNOSIS — R509 Fever, unspecified: Secondary | ICD-10-CM | POA: Diagnosis not present

## 2023-06-04 DIAGNOSIS — M329 Systemic lupus erythematosus, unspecified: Secondary | ICD-10-CM | POA: Diagnosis not present

## 2023-06-04 DIAGNOSIS — Z8673 Personal history of transient ischemic attack (TIA), and cerebral infarction without residual deficits: Secondary | ICD-10-CM | POA: Diagnosis not present

## 2023-06-04 DIAGNOSIS — E210 Primary hyperparathyroidism: Secondary | ICD-10-CM | POA: Diagnosis not present

## 2023-06-04 LAB — CBC
HCT: 41.6 % (ref 36.0–46.0)
Hemoglobin: 12.4 g/dL (ref 12.0–15.0)
MCH: 29.5 pg (ref 26.0–34.0)
MCHC: 29.8 g/dL — ABNORMAL LOW (ref 30.0–36.0)
MCV: 99 fL (ref 80.0–100.0)
Platelets: 185 10*3/uL (ref 150–400)
RBC: 4.2 MIL/uL (ref 3.87–5.11)
RDW: 15.6 % — ABNORMAL HIGH (ref 11.5–15.5)
WBC: 3.6 10*3/uL — ABNORMAL LOW (ref 4.0–10.5)
nRBC: 0 % (ref 0.0–0.2)

## 2023-06-04 LAB — BASIC METABOLIC PANEL WITH GFR
Anion gap: 9 (ref 5–15)
BUN: 15 mg/dL (ref 8–23)
CO2: 18 mmol/L — ABNORMAL LOW (ref 22–32)
Calcium: 9 mg/dL (ref 8.9–10.3)
Chloride: 111 mmol/L (ref 98–111)
Creatinine, Ser: 0.9 mg/dL (ref 0.44–1.00)
GFR, Estimated: >60 mL/min (ref >60)
Glucose, Bld: 262 mg/dL — ABNORMAL HIGH (ref 70–99)
Potassium: 3.9 mmol/L (ref 3.5–5.1)
Sodium: 138 mmol/L (ref 135–145)

## 2023-06-04 LAB — C-REACTIVE PROTEIN: CRP: 0.9 mg/dL (ref <1.0)

## 2023-06-04 LAB — SEDIMENTATION RATE: Sed Rate: 27 mm/h — ABNORMAL HIGH (ref 0–22)

## 2023-06-04 LAB — HIV ANTIBODY (ROUTINE TESTING W REFLEX): HIV Screen 4th Generation wRfx: NONREACTIVE

## 2023-06-04 MED ORDER — AMLODIPINE BESYLATE 10 MG PO TABS
10.0000 mg | ORAL_TABLET | Freq: Every day | ORAL | Status: DC
  Administered 2023-06-04: 10 mg via ORAL
  Filled 2023-06-04: qty 1

## 2023-06-04 MED ORDER — AMOXICILLIN-POT CLAVULANATE 875-125 MG PO TABS
1.0000 | ORAL_TABLET | Freq: Two times a day (BID) | ORAL | 0 refills | Status: AC
  Filled 2023-06-04: qty 10, 5d supply, fill #0

## 2023-06-04 MED ORDER — LOSARTAN POTASSIUM-HCTZ 100-25 MG PO TABS: 1.0000 | ORAL_TABLET | Freq: Every day | ORAL | Status: DC

## 2023-06-04 MED ORDER — ORAL CARE MOUTH RINSE: 15.0000 mL | OROMUCOSAL | Status: DC | PRN

## 2023-06-04 MED ORDER — GUAIFENESIN-DM 100-10 MG/5ML PO SYRP
5.0000 mL | ORAL_SOLUTION | ORAL | Status: DC | PRN
  Administered 2023-06-04: 5 mL via ORAL
  Filled 2023-06-04: qty 10

## 2023-06-04 MED ORDER — DOXYCYCLINE HYCLATE 100 MG PO TABS
100.0000 mg | ORAL_TABLET | Freq: Two times a day (BID) | ORAL | 0 refills | Status: AC
  Filled 2023-06-04: qty 10, 5d supply, fill #0

## 2023-06-04 MED ORDER — SODIUM CHLORIDE 0.9 % IV SOLN
2.0000 g | Freq: Two times a day (BID) | INTRAVENOUS | Status: DC
  Administered 2023-06-04: 2 g via INTRAVENOUS
  Filled 2023-06-04: qty 12.5

## 2023-06-04 MED ORDER — HYDROCHLOROTHIAZIDE 25 MG PO TABS
25.0000 mg | ORAL_TABLET | Freq: Every day | ORAL | Status: DC
  Administered 2023-06-04: 25 mg via ORAL
  Filled 2023-06-04: qty 1

## 2023-06-04 MED ORDER — LOSARTAN POTASSIUM 50 MG PO TABS
100.0000 mg | ORAL_TABLET | Freq: Every day | ORAL | Status: DC
  Administered 2023-06-04: 100 mg via ORAL
  Filled 2023-06-04: qty 2

## 2023-06-04 MED ORDER — KETOROLAC TROMETHAMINE 15 MG/ML IJ SOLN
15.0000 mg | Freq: Once | INTRAMUSCULAR | Status: AC
  Administered 2023-06-04: 15 mg via INTRAVENOUS
  Filled 2023-06-04: qty 1

## 2023-06-04 NOTE — Progress Notes (Addendum)
 Patient stated that she has been coughing a lot and just now started having right chest pain when coughing. Notified A. Lorre Rosin, NP. New orders for EKG, IV toradol  and cough medication. Will continue to monitor.

## 2023-06-04 NOTE — Care Management Obs Status (Signed)
 MEDICARE OBSERVATION STATUS NOTIFICATION   Patient Details  Name: Elaine Allen MRN: [id number] Date of Birth: 1955-09-17   Medicare Observation Status Notification Given:  Yes    Gertha Ku, LCSW 06/04/2023, 4:05 PM

## 2023-06-04 NOTE — Progress Notes (Signed)
 [facility] Summary Report / Loop Recorder

## 2023-06-04 NOTE — Progress Notes (Signed)
 Discharge medication delivered to patient at bedside [facility]

## 2023-06-04 NOTE — Care Management CC44 (Signed)
 Condition Code 44 Documentation Completed  Patient Details  Name: Tonda Wiederhold MRN: [id number] Date of Birth: February 23, 1955   Condition Code 44 given:  Yes Patient signature on Condition Code 44 notice:  Yes Documentation of 2 MD's agreement:  Yes Code 44 added to claim:  Yes    Krissa Utke M Shlome Baldree, LCSW 06/04/2023, 4:05 PM

## 2023-06-04 NOTE — Hospital Course (Signed)
 68 y.o. female with medical history significant for lupus, relapsing ITP related to lupus, and stroke due to lupus anticoagulant.  He also has chronic leukopenia and neutropenia likely related to lupus. He was in the infusion clinic receiving her Rituxan  infusion when they noted that she was having a low-grade fever of 100.7.  Patient not aware that she had a fever.  Her infusion was not completed she was sent down to the ED for evaluation.  The patient has been having trouble with a dry cough for the last 2 weeks.  She has a chronically stuffy nose.  She has noticed some postnasal drip in the last 2 days.  She denies any headache, no diarrhea or constipation or abdominal pain. In the emergency department her temperature was 100.7 and her heart rate was in the 120s.  Her respiratory rate was also elevated at 25.  She was started empirically on vancomycin  and cefepime because she is immunocompromised and the hospitalist have been asked to admit.  Her workup so far has included a CTA of the chest which was negative for any evidence of PE or infection.  Her vitals are now stable and she will be admitted to the hospitalist service.

## 2023-06-04 NOTE — Discharge Summary (Signed)
 Physician Discharge Summary   Patient: Elaine Allen MRN: [id number] DOB: Mar 06, 1955  Admit date:     06/03/2023  Discharge date: 06/04/23  Discharge Physician: Elaine Allen   PCP: Elaine Snare, NP   Recommendations at discharge:    Follow up with PCP in 1-2 weeks  Discharge Diagnoses: Principal Problem:   Sepsis (HCC) Active Problems:   Primary hyperparathyroidism (HCC)   Lupus (systemic lupus erythematosus) (HCC)   Hx of completed stroke   Immune thrombocytopenia (HCC)  Resolved Problems:   * No resolved hospital problems. *  Hospital Course: 68 y.o. female with medical history significant for lupus, relapsing ITP related to lupus, and stroke due to lupus anticoagulant.  He also has chronic leukopenia and neutropenia likely related to lupus. He was in the infusion clinic receiving her Rituxan  infusion when they noted that she was having a low-grade fever of 100.7.  Patient not aware that she had a fever.  Her infusion was not completed she was sent down to the ED for evaluation.  The patient has been having trouble with a dry cough for the last 2 weeks.  She has a chronically stuffy nose.  She has noticed some postnasal drip in the last 2 days.  She denies any headache, no diarrhea or constipation or abdominal pain. In the emergency department her temperature was 100.7 and her heart rate was in the 120s.  Her respiratory rate was also elevated at 25.  She was started empirically on vancomycin  and cefepime because she is immunocompromised and the hospitalist have been asked to admit.  Her workup so far has included a CTA of the chest which was negative for any evidence of PE or infection.  Her vitals are now stable and she will be admitted to the hospitalist service.  Assessment and Plan: Likely URI with sepsis present on admission - COVID, flu, RSV negative - CTA of chest negative - UA negative for infection - CT sinuses clear and unremarkable -Pt reports 2 week hx of  post-nasal drip and cough. Positive sick contacts at work, recently encountering person with PNA - Patient was continued on empiric cefepime - Blood cultures thus far neg -Pt reports feeling much improved by the following day, now afebrile -would treat with doxy and augmentin x 5 days. Pt reports tolerating augmentin without issues   2. Lupus/ Relapsing ITP/ Lupus anticoagulant  -Continue outpatient meds - Pt is very compliant with regimen and treatment    Consultants:  Procedures performed:   Disposition: Home Diet recommendation:  Regular diet DISCHARGE MEDICATION: Allergies as of 06/04/2023       Reactions   Plaquenil [hydroxychloroquine Sulfate] Anaphylaxis, Other (See Comments)   Blisters all over body   Amoxicillin  Other (See Comments)   Azithromycin  Nausea And Vomiting   Lactose Intolerance (gi) Other (See Comments)   Upset stomach   Penicillin G Itching, Nausea Only   Did it involve swelling of the face/tongue/throat, SOB, or low BP? No No Did it involve sudden or severe rash/hives, skin peeling, or any reaction on the inside of your mouth or nose? Np No Did you need to seek medical attention at a hospital or doctor's office? Yes Yes When did it last happen? around 2010 If all above answers are "NO", may proceed with cephalosporin use.   Hydroxychloroquine Sulfate Other (See Comments), Rash        Medication List     STOP taking these medications    cholecalciferol  25 MCG (1000 UNIT) tablet Commonly  known as: VITAMIN D3   folic acid 1 MG tablet Commonly known as: FOLVITE   potassium chloride  SA 20 MEQ tablet Commonly known as: KLOR-CON  M       TAKE these medications    alum & mag hydroxide-simeth 200-200-20 MG/5ML suspension Commonly known as: MAALOX/MYLANTA Take 30 mLs by mouth every 4 (four) hours as needed for indigestion or heartburn.   amLODipine  10 MG tablet Commonly known as: NORVASC  Take 10 mg by mouth daily.   amoxicillin -clavulanate  875-125 MG tablet Commonly known as: AUGMENTIN Take 1 tablet by mouth 2 (two) times daily for 5 days.   atorvastatin 40 MG tablet Commonly known as: LIPITOR Take 40 mg by mouth daily.   cyanocobalamin  1000 MCG tablet Commonly known as: VITAMIN B12 Take 1,000 mcg by mouth daily.   doxycycline  100 MG tablet Commonly known as: VIBRA -TABS Take 1 tablet (100 mg total) by mouth 2 (two) times daily for 5 days.   loratadine  10 MG tablet Commonly known as: CLARITIN  Take 1 tablet (10 mg total) by mouth daily.   losartan -hydrochlorothiazide  100-25 MG tablet Commonly known as: HYZAAR Take 1 tablet by mouth daily.   methotrexate 2.5 MG tablet Commonly known as: RHEUMATREX Take 7.5 mg by mouth once a week. Pt usually takes this medication on fridays   multivitamin with minerals Tabs tablet Take 1 tablet by mouth daily.   PROBIOTIC PO Take 1 capsule by mouth daily.   tacrolimus 0.1 % ointment Commonly known as: PROTOPIC 1 Application.        Follow-up Information     Elaine Snare, NP Follow up in 2 week(s).   Why: Hospital follow up Contact information: [address] [place] [address] [phone]                Discharge Exam: Elaine Allen Weights   06/03/23 2349  Weight: 77.2 kg   General exam: Awake, laying in bed, in nad Respiratory system: Normal respiratory effort, no wheezing Cardiovascular system: regular rate, s1, s2 Gastrointestinal system: Soft, nondistended, positive BS Central nervous system: CN2-12 grossly intact, strength intact Extremities: Perfused, no clubbing Skin: Normal skin turgor, no notable skin lesions seen Psychiatry: Mood normal // no visual hallucinations   Condition at discharge: fair  The results of significant diagnostics from this hospitalization (including imaging, microbiology, ancillary and laboratory) are listed below for reference.   Imaging Studies: CT SINUS WO CONTRAST Result Date: 06/04/2023 CLINICAL DATA:  Fever  and chills EXAM: CT MAXILLOFACIAL WITHOUT CONTRAST TECHNIQUE: Multidetector CT imaging of the maxillofacial structures was performed. Multiplanar CT image reconstructions were also generated. RADIATION DOSE REDUCTION: This exam was performed according to the departmental dose-optimization program which includes automated exposure control, adjustment of the mA and/or kV according to patient size and/or use of iterative reconstruction technique. COMPARISON:  CT head 03/18/2022 FINDINGS: Osseous: No fracture or mandibular dislocation. No destructive process. Orbits: Postoperative change about the right orbit. No acute abnormality. Sinuses: Paranasal sinuses and visualized mastoid air cells are well aerated. Soft tissues: Unremarkable. Limited intracranial: Unremarkable. IMPRESSION: No acute abnormality. Electronically Signed   By: Rozell Cornet M.D.   On: 06/04/2023 00:01   CT Angio Chest Pulmonary Embolism (PE) W or WO Contrast Result Date: 06/03/2023 CLINICAL DATA:  Cough and fever.  PE suspected EXAM: CT ANGIOGRAPHY CHEST WITH CONTRAST TECHNIQUE: Multidetector CT imaging of the chest was performed using the standard protocol during bolus administration of intravenous contrast. Multiplanar CT image reconstructions and MIPs were obtained to evaluate the vascular anatomy.  RADIATION DOSE REDUCTION: This exam was performed according to the departmental dose-optimization program which includes automated exposure control, adjustment of the mA and/or kV according to patient size and/or use of iterative reconstruction technique. CONTRAST:  75mL OMNIPAQUE IOHEXOL 350 MG/ML SOLN COMPARISON:  Same day chest radiograph FINDINGS: Cardiovascular: Negative for acute pulmonary embolism. Normal caliber thoracic aorta. No pericardial effusion. Loop recorder. Mediastinum/Nodes: Trachea and esophagus are unremarkable. No thoracic adenopathy. Right thyroidectomy. Lungs/Pleura: Hypoventilation changes in the lower lobes. No focal  consolidation, pleural effusion, or pneumothorax. Upper Abdomen: No acute abnormality. Musculoskeletal: No acute fracture. Review of the MIP images confirms the above findings. IMPRESSION: Negative for acute pulmonary embolism. No acute abnormality in the chest. Electronically Signed   By: Rozell Cornet M.D.   On: 06/03/2023 20:55   DG Chest Port 1 View Result Date: 06/03/2023 CLINICAL DATA:  cough, fever EXAM: PORTABLE CHEST - 1 VIEW COMPARISON:  June 23, 2018 FINDINGS: Low lung volumes. No focal airspace consolidation, pleural effusion, or pneumothorax. No cardiomegaly. Cardiac loop recorder device. Tortuous aorta with aortic atherosclerosis. No acute fracture or destructive lesions. Multilevel thoracic osteophytosis. Surgical clips in the right lower neck. IMPRESSION: No acute cardiopulmonary abnormality. Electronically Signed   By: Rance Burrows M.D.   On: 06/03/2023 18:49   VAS US  LOWER EXTREMITY VENOUS (DVT) (7a-7p) Result Date: 06/03/2023  Lower Venous DVT Study Patient Name:  KABRIA HETZER  Date of Exam:   06/03/2023 Medical Rec #: [id number]            Accession #:    [id number] Date of Birth: October 07, 1955            Patient Gender: F Patient Age:   20 years Exam Location:  [facility] Procedure:      VAS US  LOWER EXTREMITY VENOUS (DVT) Referring Phys: Donata Fryer MESSICK --------------------------------------------------------------------------------  Indications: Edema.  Risk Factors: None identified. Limitations: Poor ultrasound/tissue interface. Comparison Study: No prior studies. Performing Technologist: Lerry Ransom RVT  Examination Guidelines: A complete evaluation includes B-mode imaging, spectral Doppler, color Doppler, and power Doppler as needed of all accessible portions of each vessel. Bilateral testing is considered an integral part of a complete examination. Limited examinations for reoccurring indications may be performed as noted. The reflux portion of the exam is performed  with the patient in reverse Trendelenburg.  +---------+---------------+---------+-----------+----------+--------------+ RIGHT    CompressibilityPhasicitySpontaneityPropertiesThrombus Aging +---------+---------------+---------+-----------+----------+--------------+ CFV      Full           Yes      Yes                                 +---------+---------------+---------+-----------+----------+--------------+ SFJ      Full                                                        +---------+---------------+---------+-----------+----------+--------------+ FV Prox  Full                                                        +---------+---------------+---------+-----------+----------+--------------+ FV Mid   Full                                                        +---------+---------------+---------+-----------+----------+--------------+  FV DistalFull                                                        +---------+---------------+---------+-----------+----------+--------------+ PFV      Full                                                        +---------+---------------+---------+-----------+----------+--------------+ POP      Full           Yes      Yes                                 +---------+---------------+---------+-----------+----------+--------------+ PTV      Full                                                        +---------+---------------+---------+-----------+----------+--------------+ PERO     Full                                                        +---------+---------------+---------+-----------+----------+--------------+   +---------+---------------+---------+-----------+----------+--------------+ LEFT     CompressibilityPhasicitySpontaneityPropertiesThrombus Aging +---------+---------------+---------+-----------+----------+--------------+ CFV      Full           Yes      Yes                                  +---------+---------------+---------+-----------+----------+--------------+ SFJ      Full                                                        +---------+---------------+---------+-----------+----------+--------------+ FV Prox  Full                                                        +---------+---------------+---------+-----------+----------+--------------+ FV Mid   Full                                                        +---------+---------------+---------+-----------+----------+--------------+ FV DistalFull                                                        +---------+---------------+---------+-----------+----------+--------------+  PFV      Full                                                        +---------+---------------+---------+-----------+----------+--------------+ POP      Full           Yes      Yes                                 +---------+---------------+---------+-----------+----------+--------------+ PTV      Full                                                        +---------+---------------+---------+-----------+----------+--------------+ PERO     Full                                                        +---------+---------------+---------+-----------+----------+--------------+     Summary: RIGHT: - There is no evidence of deep vein thrombosis in the lower extremity.  - No cystic structure found in the popliteal fossa.  LEFT: - There is no evidence of deep vein thrombosis in the lower extremity.  - No cystic structure found in the popliteal fossa.  *See table(s) above for measurements and observations. Electronically signed by Runell Countryman on 06/03/2023 at 6:21:02 PM.    Final    CUP PACEART REMOTE DEVICE CHECK Result Date: 05/26/2023 ILR summary report received. Battery status OK. Normal device function. No new symptom, tachy, brady, or pause episodes. No new AF episodes. Monthly summary reports and ROV/PRN LA,  CVRS   Microbiology: Results for orders placed or performed during the hospital encounter of 06/03/23  Resp panel by RT-PCR (RSV, Flu A&B, Covid)     Status: None   Collection Time: 06/03/23  4:20 PM   Specimen: Nasal Swab  Result Value Ref Range Status   SARS Coronavirus 2 by RT PCR NEGATIVE NEGATIVE Final    Comment: (NOTE) SARS-CoV-2 target nucleic acids are NOT DETECTED.  The SARS-CoV-2 RNA is generally detectable in upper respiratory specimens during the acute phase of infection. The lowest concentration of SARS-CoV-2 viral copies this assay can detect is 138 copies/mL. A negative result does not preclude SARS-Cov-2 infection and should not be used as the sole basis for treatment or other patient management decisions. A negative result may occur with  improper specimen collection/handling, submission of specimen other than nasopharyngeal swab, presence of viral mutation(s) within the areas targeted by this assay, and inadequate number of viral copies(<138 copies/mL). A negative result must be combined with clinical observations, patient history, and epidemiological information. The expected result is Negative.  Fact Sheet for Patients:  BloggerCourse.com  Fact Sheet for Healthcare Providers:  SeriousBroker.it  This test is no t yet approved or cleared by the [place]  FDA and  has been authorized for detection and/or diagnosis of SARS-CoV-2 by FDA under an Emergency Use Authorization (EUA). This EUA will remain  in effect (meaning  this test can be used) for the duration of the COVID-19 declaration under Section 564(b)(1) of the Act, 21 U.S.C.section 360bbb-3(b)(1), unless the authorization is terminated  or revoked sooner.       Influenza A by PCR NEGATIVE NEGATIVE Final   Influenza B by PCR NEGATIVE NEGATIVE Final    Comment: (NOTE) The Xpert Xpress SARS-CoV-2/FLU/RSV plus assay is intended as an aid in the  diagnosis of influenza from Nasopharyngeal swab specimens and should not be used as a sole basis for treatment. Nasal washings and aspirates are unacceptable for Xpert Xpress SARS-CoV-2/FLU/RSV testing.  Fact Sheet for Patients: BloggerCourse.com  Fact Sheet for Healthcare Providers: SeriousBroker.it  This test is not yet approved or cleared by the [place]  FDA and has been authorized for detection and/or diagnosis of SARS-CoV-2 by FDA under an Emergency Use Authorization (EUA). This EUA will remain in effect (meaning this test can be used) for the duration of the COVID-19 declaration under Section 564(b)(1) of the Act, 21 U.S.C. section 360bbb-3(b)(1), unless the authorization is terminated or revoked.     Resp Syncytial Virus by PCR NEGATIVE NEGATIVE Final    Comment: (NOTE) Fact Sheet for Patients: BloggerCourse.com  Fact Sheet for Healthcare Providers: SeriousBroker.it  This test is not yet approved or cleared by the [place]  FDA and has been authorized for detection and/or diagnosis of SARS-CoV-2 by FDA under an Emergency Use Authorization (EUA). This EUA will remain in effect (meaning this test can be used) for the duration of the COVID-19 declaration under Section 564(b)(1) of the Act, 21 U.S.C. section 360bbb-3(b)(1), unless the authorization is terminated or revoked.  Performed at [facility], [address] [place], [place] [address]     Labs: CBC: Recent Labs  Lab 06/03/23 1243 06/03/23 1620 06/03/23 1659 06/04/23 0402  WBC 5.1 4.9  --  3.6*  NEUTROABS 3.5 4.1  --   --   HGB 13.0 13.0 13.6 12.4  HCT 39.1 39.8 40.0 41.6  MCV 89.3 91.7  --  99.0  PLT 222 215  --  185   Basic Metabolic Panel: Recent Labs  Lab 06/03/23 1243 06/03/23 1620 06/03/23 1659 06/04/23 0402  NA 140 136 138 138  K 3.9 3.6 3.8 3.9  CL 103 104 106  111  CO2 31 23  --  18*  GLUCOSE 110* 122* 122* 262*  BUN 13 14 14 15   CREATININE 0.87 0.81 0.80 0.90  CALCIUM 9.6 9.3  --  9.0   Liver Function Tests: Recent Labs  Lab 06/03/23 1243 06/03/23 1620  AST 18 23  ALT 10 11  ALKPHOS 115 107  BILITOT 1.0 1.0  PROT 7.5 7.4  ALBUMIN 4.4 4.0   CBG: No results for input(s): "GLUCAP" in the last 168 hours.  Discharge time spent: less than 30 minutes.  Signed: Cherylle Corwin, MD [facility] 06/04/2023

## 2023-06-04 NOTE — Addendum Note (Signed)
 Addended by: Lott Rouleau A on: 06/04/2023 03:59 PM   Modules accepted: Orders

## 2023-06-04 NOTE — Plan of Care (Signed)

## 2023-06-07 NOTE — Telephone Encounter (Signed)
 Pt went to ED and was admitted and treated for sepsis.

## 2023-06-09 LAB — CULTURE, BLOOD (ROUTINE X 2): Culture: NO GROWTH

## 2023-06-10 ENCOUNTER — Other Ambulatory Visit: Payer: Self-pay | Admitting: Hematology

## 2023-06-10 DIAGNOSIS — D693 Immune thrombocytopenic purpura: Secondary | ICD-10-CM

## 2023-06-25 ENCOUNTER — Ambulatory Visit (INDEPENDENT_AMBULATORY_CARE_PROVIDER_SITE_OTHER): Payer: Self-pay

## 2023-06-25 DIAGNOSIS — I4892 Unspecified atrial flutter: Secondary | ICD-10-CM

## 2023-06-28 LAB — CUP PACEART REMOTE DEVICE CHECK
Date Time Interrogation Session: 20250620174315
Implantable Pulse Generator Implant Date: 20250303

## 2023-06-29 ENCOUNTER — Encounter

## 2023-06-30 ENCOUNTER — Encounter (HOSPITAL_COMMUNITY): Payer: Self-pay

## 2023-06-30 ENCOUNTER — Emergency Department (HOSPITAL_COMMUNITY)

## 2023-06-30 ENCOUNTER — Ambulatory Visit: Payer: Self-pay | Admitting: Cardiovascular Disease

## 2023-06-30 ENCOUNTER — Inpatient Hospital Stay (HOSPITAL_COMMUNITY)
Admission: EM | Admit: 2023-06-30 | Discharge: 2023-07-05 | DRG: 872 | Disposition: A | Discharge status: Home / Self Care | Attending: Internal Medicine | Admitting: Internal Medicine

## 2023-06-30 ENCOUNTER — Other Ambulatory Visit: Payer: Self-pay

## 2023-06-30 DIAGNOSIS — D84821 Immunodeficiency due to drugs: Secondary | ICD-10-CM | POA: Diagnosis present

## 2023-06-30 DIAGNOSIS — E869 Volume depletion, unspecified: Secondary | ICD-10-CM | POA: Diagnosis not present

## 2023-06-30 DIAGNOSIS — R197 Diarrhea, unspecified: Secondary | ICD-10-CM | POA: Diagnosis not present

## 2023-06-30 DIAGNOSIS — Z79899 Other long term (current) drug therapy: Secondary | ICD-10-CM

## 2023-06-30 DIAGNOSIS — D693 Immune thrombocytopenic purpura: Secondary | ICD-10-CM | POA: Diagnosis present

## 2023-06-30 DIAGNOSIS — D638 Anemia in other chronic diseases classified elsewhere: Secondary | ICD-10-CM | POA: Diagnosis present

## 2023-06-30 DIAGNOSIS — Z1152 Encounter for screening for COVID-19: Secondary | ICD-10-CM | POA: Diagnosis not present

## 2023-06-30 DIAGNOSIS — Z8673 Personal history of transient ischemic attack (TIA), and cerebral infarction without residual deficits: Secondary | ICD-10-CM

## 2023-06-30 DIAGNOSIS — E669 Obesity, unspecified: Secondary | ICD-10-CM | POA: Diagnosis present

## 2023-06-30 DIAGNOSIS — R55 Syncope and collapse: Secondary | ICD-10-CM | POA: Diagnosis not present

## 2023-06-30 DIAGNOSIS — E876 Hypokalemia: Secondary | ICD-10-CM | POA: Diagnosis not present

## 2023-06-30 DIAGNOSIS — B370 Candidal stomatitis: Secondary | ICD-10-CM | POA: Diagnosis present

## 2023-06-30 DIAGNOSIS — J0510 Acute epiglottitis without obstruction: Principal | ICD-10-CM | POA: Diagnosis present

## 2023-06-30 DIAGNOSIS — Z79631 Long term (current) use of antimetabolite agent: Secondary | ICD-10-CM

## 2023-06-30 DIAGNOSIS — M329 Systemic lupus erythematosus, unspecified: Secondary | ICD-10-CM | POA: Diagnosis present

## 2023-06-30 DIAGNOSIS — I10 Essential (primary) hypertension: Secondary | ICD-10-CM | POA: Diagnosis present

## 2023-06-30 DIAGNOSIS — E785 Hyperlipidemia, unspecified: Secondary | ICD-10-CM | POA: Diagnosis present

## 2023-06-30 DIAGNOSIS — Z79620 Long term (current) use of immunosuppressive biologic: Secondary | ICD-10-CM | POA: Diagnosis not present

## 2023-06-30 DIAGNOSIS — A419 Sepsis, unspecified organism: Secondary | ICD-10-CM | POA: Diagnosis present

## 2023-06-30 LAB — COMPREHENSIVE METABOLIC PANEL WITH GFR
ALT: 26 U/L (ref 0–44)
AST: 40 U/L (ref 15–41)
Albumin: 4.3 g/dL (ref 3.5–5.0)
Alkaline Phosphatase: 102 U/L (ref 38–126)
Anion gap: 10 (ref 5–15)
BUN: 12 mg/dL (ref 8–23)
CO2: 23 mmol/L (ref 22–32)
Calcium: 9.3 mg/dL (ref 8.9–10.3)
Chloride: 105 mmol/L (ref 98–111)
Creatinine, Ser: 1.04 mg/dL — ABNORMAL HIGH (ref 0.44–1.00)
GFR, Estimated: 58 mL/min — ABNORMAL LOW (ref >60)
Glucose, Bld: 107 mg/dL — ABNORMAL HIGH (ref 70–99)
Potassium: 3.8 mmol/L (ref 3.5–5.1)
Sodium: 138 mmol/L (ref 135–145)
Total Bilirubin: 1.9 mg/dL — ABNORMAL HIGH (ref 0.0–1.2)
Total Protein: 7.2 g/dL (ref 6.5–8.1)

## 2023-06-30 LAB — CBC WITH DIFFERENTIAL/PLATELET
Abs Immature Granulocytes: 0.01 10*3/uL (ref 0.00–0.07)
Basophils Absolute: 0 10*3/uL (ref 0.0–0.1)
Basophils Relative: 0 %
Eosinophils Absolute: 0 10*3/uL (ref 0.0–0.5)
Eosinophils Relative: 1 %
HCT: 39.7 % (ref 36.0–46.0)
Hemoglobin: 12.7 g/dL (ref 12.0–15.0)
Immature Granulocytes: 1 %
Lymphocytes Relative: 30 %
Lymphs Abs: 0.5 10*3/uL — ABNORMAL LOW (ref 0.7–4.0)
MCH: 30.2 pg (ref 26.0–34.0)
MCHC: 32 g/dL (ref 30.0–36.0)
MCV: 94.3 fL (ref 80.0–100.0)
Monocytes Absolute: 0 10*3/uL — ABNORMAL LOW (ref 0.1–1.0)
Monocytes Relative: 2 %
Neutro Abs: 1.2 10*3/uL — ABNORMAL LOW (ref 1.7–7.7)
Neutrophils Relative %: 66 %
Platelets: 152 10*3/uL (ref 150–400)
RBC: 4.21 MIL/uL (ref 3.87–5.11)
RDW: 15.1 % (ref 11.5–15.5)
WBC: 1.8 10*3/uL — ABNORMAL LOW (ref 4.0–10.5)
nRBC: 0 % (ref 0.0–0.2)

## 2023-06-30 LAB — TYPE AND SCREEN
ABO/RH(D): O POS
Antibody Screen: NEGATIVE

## 2023-06-30 LAB — PROTIME-INR
INR: 1 (ref 0.8–1.2)
Prothrombin Time: 13.2 s (ref 11.4–15.2)

## 2023-06-30 LAB — RESP PANEL BY RT-PCR (RSV, FLU A&B, COVID)  RVPGX2
Influenza A by PCR: NEGATIVE
Influenza B by PCR: NEGATIVE
Resp Syncytial Virus by PCR: NEGATIVE
SARS Coronavirus 2 by RT PCR: NEGATIVE

## 2023-06-30 LAB — I-STAT CG4 LACTIC ACID, ED
Lactic Acid, Venous: 0.7 mmol/L (ref 0.5–1.9)
Lactic Acid, Venous: 0.9 mmol/L (ref 0.5–1.9)

## 2023-06-30 LAB — GROUP A STREP BY PCR: Group A Strep by PCR: NOT DETECTED

## 2023-06-30 LAB — MRSA NEXT GEN BY PCR, NASAL: MRSA by PCR Next Gen: NOT DETECTED

## 2023-06-30 MED ORDER — ORAL CARE MOUTH RINSE: 15.0000 mL | OROMUCOSAL | Status: DC | PRN

## 2023-06-30 MED ORDER — HYDRALAZINE HCL 20 MG/ML IJ SOLN
10.0000 mg | INTRAMUSCULAR | Status: DC | PRN
  Administered 2023-06-30: 10 mg via INTRAVENOUS
  Administered 2023-07-01: 20 mg via INTRAVENOUS
  Filled 2023-06-30 (×2): qty 1

## 2023-06-30 MED ORDER — ACETAMINOPHEN 10 MG/ML IV SOLN
1000.0000 mg | Freq: Once | INTRAVENOUS | Status: AC
  Administered 2023-07-01: 1000 mg via INTRAVENOUS
  Filled 2023-06-30: qty 100

## 2023-06-30 MED ORDER — POLYETHYLENE GLYCOL 3350 17 G PO PACK: 17.0000 g | PACK | Freq: Every day | ORAL | Status: DC | PRN

## 2023-06-30 MED ORDER — ACETAMINOPHEN 325 MG PO TABS
650.0000 mg | ORAL_TABLET | Freq: Once | ORAL | Status: DC
  Filled 2023-06-30: qty 2

## 2023-06-30 MED ORDER — LACTATED RINGERS IV SOLN: INTRAVENOUS | Status: AC

## 2023-06-30 MED ORDER — ACETAMINOPHEN 160 MG/5ML PO SOLN
650.0000 mg | Freq: Once | ORAL | Status: AC
  Administered 2023-06-30: 650 mg via ORAL
  Filled 2023-06-30: qty 20.3

## 2023-06-30 MED ORDER — DIPHENHYDRAMINE HCL 50 MG/ML IJ SOLN
12.5000 mg | Freq: Three times a day (TID) | INTRAMUSCULAR | Status: DC
  Administered 2023-06-30 – 2023-07-04 (×10): 12.5 mg via INTRAVENOUS
  Filled 2023-06-30 (×11): qty 1

## 2023-06-30 MED ORDER — IOHEXOL 300 MG/ML  SOLN
100.0000 mL | Freq: Once | INTRAMUSCULAR | Status: AC | PRN
  Administered 2023-06-30: 100 mL via INTRAVENOUS

## 2023-06-30 MED ORDER — LACTATED RINGERS IV BOLUS (SEPSIS)
1000.0000 mL | Freq: Once | INTRAVENOUS | Status: AC
  Administered 2023-06-30: 1000 mL via INTRAVENOUS

## 2023-06-30 MED ORDER — VANCOMYCIN HCL IN DEXTROSE 1-5 GM/200ML-% IV SOLN
1000.0000 mg | Freq: Once | INTRAVENOUS | Status: AC
  Administered 2023-06-30: 1000 mg via INTRAVENOUS
  Filled 2023-06-30: qty 200

## 2023-06-30 MED ORDER — METRONIDAZOLE 500 MG/100ML IV SOLN
500.0000 mg | Freq: Once | INTRAVENOUS | Status: AC
  Administered 2023-06-30: 500 mg via INTRAVENOUS
  Filled 2023-06-30: qty 100

## 2023-06-30 MED ORDER — SODIUM CHLORIDE 0.9 % IV SOLN
3.0000 g | Freq: Three times a day (TID) | INTRAVENOUS | Status: DC
  Administered 2023-07-01 – 2023-07-02 (×5): 3 g via INTRAVENOUS
  Filled 2023-06-30 (×6): qty 8

## 2023-06-30 MED ORDER — NYSTATIN 100000 UNIT/ML MT SUSP
5.0000 mL | Freq: Four times a day (QID) | OROMUCOSAL | Status: DC
  Administered 2023-06-30 – 2023-07-01 (×2): 500000 [IU] via ORAL
  Filled 2023-06-30 (×3): qty 5

## 2023-06-30 MED ORDER — FENTANYL CITRATE PF 50 MCG/ML IJ SOSY
50.0000 ug | PREFILLED_SYRINGE | Freq: Once | INTRAMUSCULAR | Status: AC
  Administered 2023-06-30: 50 ug via INTRAVENOUS
  Filled 2023-06-30: qty 1

## 2023-06-30 MED ORDER — CEFEPIME HCL 2 G IV SOLR
2.0000 g | Freq: Once | INTRAVENOUS | Status: AC
  Administered 2023-06-30: 2 g via INTRAVENOUS
  Filled 2023-06-30: qty 12.5

## 2023-06-30 MED ORDER — CHLORHEXIDINE GLUCONATE CLOTH 2 % EX PADS
6.0000 | MEDICATED_PAD | Freq: Every day | CUTANEOUS | Status: DC
  Administered 2023-07-01 – 2023-07-05 (×5): 6 via TOPICAL

## 2023-06-30 MED ORDER — DOCUSATE SODIUM 100 MG PO CAPS
100.0000 mg | ORAL_CAPSULE | Freq: Two times a day (BID) | ORAL | Status: DC | PRN
  Filled 2023-06-30: qty 1

## 2023-06-30 MED ORDER — FENTANYL CITRATE PF 50 MCG/ML IJ SOSY
100.0000 ug | PREFILLED_SYRINGE | Freq: Once | INTRAMUSCULAR | Status: AC
  Administered 2023-06-30: 100 ug via INTRAVENOUS
  Filled 2023-06-30: qty 2

## 2023-06-30 MED ORDER — ALBUTEROL SULFATE (2.5 MG/3ML) 0.083% IN NEBU
2.5000 mg | INHALATION_SOLUTION | RESPIRATORY_TRACT | Status: DC
  Administered 2023-06-30 – 2023-07-02 (×7): 2.5 mg via RESPIRATORY_TRACT
  Filled 2023-06-30 (×7): qty 3

## 2023-06-30 MED ORDER — DEXAMETHASONE SODIUM PHOSPHATE 10 MG/ML IJ SOLN: 10.0000 mg | Freq: Once | INTRAMUSCULAR | Status: DC

## 2023-06-30 MED ORDER — ALBUTEROL SULFATE (2.5 MG/3ML) 0.083% IN NEBU
2.5000 mg | INHALATION_SOLUTION | RESPIRATORY_TRACT | Status: DC | PRN
Start: 2023-06-30 — End: 2023-07-05

## 2023-06-30 MED ORDER — DEXAMETHASONE SODIUM PHOSPHATE 10 MG/ML IJ SOLN
10.0000 mg | Freq: Four times a day (QID) | INTRAMUSCULAR | Status: DC
  Administered 2023-06-30: 10 mg via INTRAVENOUS
  Filled 2023-06-30: qty 1

## 2023-06-30 MED ORDER — RACEPINEPHRINE HCL 2.25 % IN NEBU
0.5000 mL | INHALATION_SOLUTION | Freq: Once | RESPIRATORY_TRACT | Status: AC
  Administered 2023-06-30: 0.5 mL via RESPIRATORY_TRACT
  Filled 2023-06-30: qty 0.5

## 2023-06-30 MED ORDER — DEXAMETHASONE SODIUM PHOSPHATE 10 MG/ML IJ SOLN
10.0000 mg | Freq: Once | INTRAMUSCULAR | Status: AC
  Administered 2023-06-30: 10 mg via INTRAVENOUS
  Filled 2023-06-30: qty 1

## 2023-06-30 MED ORDER — ONDANSETRON HCL 4 MG/2ML IJ SOLN: 4.0000 mg | Freq: Four times a day (QID) | INTRAMUSCULAR | Status: DC | PRN

## 2023-06-30 MED ORDER — LACTATED RINGERS IV BOLUS (SEPSIS)
500.0000 mL | Freq: Once | INTRAVENOUS | Status: AC
  Administered 2023-06-30: 500 mL via INTRAVENOUS

## 2023-06-30 NOTE — Progress Notes (Addendum)
 eLink Physician-Brief Progress Note Patient Name: Elaine Allen DOB: Jan 12, 1955 MRN: [id number]   Date of Service  06/30/2023  HPI/Events of Note  Patient with elevated BP and temperature, she is NPO currently.  eICU Interventions  PRN iv Hydralazine ordered. Tylenol  1 gm iv x 1 ordered. New Patient Evaluation.        Hector Venne U Jamear Carbonneau 06/30/2023, 11:36 PM

## 2023-06-30 NOTE — ED Notes (Signed)
Pt aware that urine sample is needed, unable to void at this time.

## 2023-06-30 NOTE — H&P (Signed)
 NAME:  Elaine Allen, MRN:  [id number], DOB:  November 07, 1955, LOS: 0 ADMISSION DATE:  06/30/2023, CONSULTATION DATE:  07/01/23 REFERRING MD:  EDP, CHIEF COMPLAINT:  sore throat   History of Present Illness:  68 yo female presents with progressive worsening of sore throat. Pt is historically on multiple immunosuppressants and occasional steroids. She endorses fever prior to presentation and additionally has a temp of 102.9 at admission. Pt states she has had intermittent fevers without explanation and recently resulted in admission 1 month ago. 06/29/23 she developed a sore throat and repeated fever. She has been unable to eat or drink as well as states it has been difficult for her to talk 2/2 pain. She denies ha/chills/n/v/d. No known recent sick contacts.  ENT was contacted by EDP and recommended CCM admission with scheduled steroids q8h and abx, no recommendation for emergent laryngoscopy.   Ccm was asked for admission to monitor for airway compromise.   Pertinent  Medical History  Lupus Htn Hyperlipidemia Chronic allergies   Significant Hospital Events: Including procedures, antibiotic start and stop dates in addition to other pertinent events   Admitted to ICU for close airway monitoring 6/25  Interim History / Subjective:    Objective    Blood pressure (!) 198/97, pulse (!) 113, temperature (!) 100.5 F (38.1 C), temperature source Oral, resp. rate 19, SpO2 94%.        Intake/Output Summary (Last 24 hours) at 06/30/2023 2144 Last data filed at 06/30/2023 1928 Gross per 24 hour  Intake 2800 ml  Output --  Net 2800 ml   There were no vitals filed for this visit.  Examination: General: nad overall but is tremulous and with stuttered speech which she states is abnormal and 2/2 pain HENT: ncat, some noted vitiligo on face and scalp, eomi, perrla, mmmp Lungs: ctab, no stridor Cardiovascular: tachycardic but reg, no m/g/r Abdomen: soft nt/nd bs + Extremities: no  c/c/e Neuro: no focal deficits, aaox4 tremulous with some stuttered speech GU: deferred  Resolved problem list   Assessment and Plan  Acute epiglottis Pharyngitis, acute Chronic immunosuppression  Lupus AOCD Osteopenia H/o CVA HTN -admit to icu for close airway monitoring -noted imaging with narrowed airway -ent was contacted by edp and recommended decadron   -empiric abx for now for epiglotittis  -hold immunosuppressants -pain control -cont decadron  and taper as able -will need prn anti-htn meds in light of baseline HTN and currently exacerbated with pain -will make pt npo except meds for now -prn nebs -prn humidified oxygen for comfort -can utilize racemic epi should develop stridor, currently does not have   Best Practice (right click and Reselect all SmartList Selections daily)   Diet/type: NPO w/ oral meds DVT prophylaxis SCD Pressure ulcer(s): N/A GI prophylaxis: N/A Lines: N/A Foley:  N/A Code Status:  full code Last date of multidisciplinary goals of care discussion [d/w pt she requests all life saving interventions]  Labs   CBC: Recent Labs  Lab 06/30/23 1543  WBC 1.8*  NEUTROABS 1.2*  HGB 12.7  HCT 39.7  MCV 94.3  PLT 152    Basic Metabolic Panel: Recent Labs  Lab 06/30/23 1543  NA 138  K 3.8  CL 105  CO2 23  GLUCOSE 107*  BUN 12  CREATININE 1.04*  CALCIUM 9.3   GFR: CrCl cannot be calculated (Unknown ideal weight.). Recent Labs  Lab 06/30/23 1543 06/30/23 1545 06/30/23 1647  WBC 1.8*  --   --   LATICACIDVEN  --  0.7 0.9  Liver Function Tests: Recent Labs  Lab 06/30/23 1543  AST 40  ALT 26  ALKPHOS 102  BILITOT 1.9*  PROT 7.2  ALBUMIN 4.3   No results for input(s): LIPASE, AMYLASE in the last 168 hours. No results for input(s): AMMONIA in the last 168 hours.  ABG    Component Value Date/Time   TCO2 26 06/03/2023 1659     Coagulation Profile: Recent Labs  Lab 06/30/23 1543  INR 1.0    Cardiac  Enzymes: No results for input(s): CKTOTAL, CKMB, CKMBINDEX, TROPONINI in the last 168 hours.  HbA1C: Hgb A1c MFr Bld  Date/Time Value Ref Range Status  09/13/2017 02:36 PM 6.4 4.6 - 6.5 % Final    Comment:    Glycemic Control Guidelines for People with Diabetes:Non Diabetic:  <6%Goal of Therapy: <7%Additional Action Suggested:  >8%   02/19/2017 12:02 PM 6.5 4.6 - 6.5 % Final    Comment:    Glycemic Control Guidelines for People with Diabetes:Non Diabetic:  <6%Goal of Therapy: <7%Additional Action Suggested:  >8%     CBG: No results for input(s): GLUCAP in the last 168 hours.  Review of Systems:   As per HPI  Past Medical History:  She,  has a past medical history of Arthritis, Blind right eye, Chronic anemia, Detached retina, right, History of kidney stones, Hypertension, Hyperthyroidism, Lupus, Pneumonia, Retinal detachment, Spasmodic dysphonia, Stroke (HCC) (2015), and Vertigo.   Surgical History:   Past Surgical History:  Procedure Laterality Date   ABDOMINAL HYSTERECTOMY     BREAST EXCISIONAL BIOPSY Left    KNEE SURGERY Left    THYROID  SURGERY     TOTAL HIP ARTHROPLASTY Left 06/27/2018   Procedure: TOTAL HIP ARTHROPLASTY ANTERIOR APPROACH;  Surgeon: Liam Lerner, MD;  Location: [facility];  Service: Orthopedics;  Laterality: Left;     Social History:   reports that she has never smoked. She has never been exposed to tobacco smoke. She has never used smokeless tobacco. She reports that she does not drink alcohol and does not use drugs.   Family History:  Her family history includes Arthritis in her mother; Cancer in her father and mother; Diabetes in her mother; Ovarian cancer in her sister; Throat cancer in her brother.   Allergies Allergies  Allergen Reactions   Plaquenil [Hydroxychloroquine Sulfate] Anaphylaxis and Other (See Comments)    Blisters all over body   Amoxicillin  Other (See Comments)   Azithromycin  Nausea And Vomiting   Lactose Intolerance (Gi)  Other (See Comments)    Upset stomach   Penicillin G Itching and Nausea Only    Did it involve swelling of the face/tongue/throat, SOB, or low BP? No No  Did it involve sudden or severe rash/hives, skin peeling, or any reaction on the inside of your mouth or nose? Np No  Did you need to seek medical attention at a hospital or doctor's office? Yes Yes  When did it last happen? around 2010  If all above answers are "NO", may proceed with cephalosporin use.   Hydroxychloroquine Sulfate Other (See Comments) and Rash     Home Medications  Prior to Admission medications   Medication Sig Start Date End Date Taking? Authorizing Provider  alum & mag hydroxide-simeth (MAALOX/MYLANTA) 200-200-20 MG/5ML suspension Take 30 mLs by mouth every 4 (four) hours as needed for indigestion or heartburn. 03/20/22   Swayze, Ava, DO  amLODipine  (NORVASC ) 10 MG tablet Take 10 mg by mouth daily. 03/16/22   [provider]  atorvastatin (LIPITOR) 40  MG tablet Take 40 mg by mouth daily. Patient not taking: Reported on 06/03/2023    [provider]  loratadine  (CLARITIN ) 10 MG tablet Take 1 tablet (10 mg total) by mouth daily. 03/21/22   Swayze, Ava, DO  losartan -hydrochlorothiazide  (HYZAAR) 100-25 MG tablet Take 1 tablet by mouth daily. 06/23/21   Rollene Almarie LABOR, MD  methotrexate (RHEUMATREX) 2.5 MG tablet Take 7.5 mg by mouth once a week. Pt usually takes this medication on fridays 03/11/23   [provider]  Multiple Vitamin (MULTIVITAMIN WITH MINERALS) TABS tablet Take 1 tablet by mouth daily. Patient not taking: Reported on 06/03/2023    [provider]  Probiotic Product (PROBIOTIC PO) Take 1 capsule by mouth daily. Patient not taking: Reported on 06/03/2023    [provider]  tacrolimus (PROTOPIC) 0.1 % ointment 1 Application. 12/01/22   [provider]  vitamin B-12 (CYANOCOBALAMIN ) 1000 MCG tablet Take 1,000 mcg by mouth daily.    [provider]     Critical care time: 

## 2023-06-30 NOTE — ED Provider Notes (Signed)
 [place] EMERGENCY DEPARTMENT AT [facility] Provider Note   CSN: [id number] Arrival date & time: 06/30/23  1416     Patient presents with: Sore Throat and Abdominal Pain   Elaine Allen is a 68 y.o. female.    Sore Throat  Abdominal Pain Associated symptoms: fatigue, fever and sore throat   Patient presents for fever and sore throat.  Medical history includes SLE, anemia, osteopenia, CVA, HTN, prediabetes.  For her lupus, she takes methotrexate, Plaquenil, prednisone , and undergoes rituximab  infusions.  She was admitted 1 month ago for low-grade fever of unknown origin.  Starting yesterday, she developed sore throat and fevers.  She has painful swallowing.  She denies any other recent symptoms.     Prior to Admission medications   Medication Sig Start Date End Date Taking? Authorizing Provider  hydrocortisone 2.5 % cream Apply 1 Application topically 2 (two) times daily. 06/24/23  Yes [provider]  rosuvastatin (CRESTOR) 10 MG tablet Take 10 mg by mouth at bedtime. 06/30/23  Yes [provider]  thiamine (VITAMIN B-1) 50 MG tablet Take 50 mg by mouth daily. 06/16/23  Yes [provider]  alum & mag hydroxide-simeth (MAALOX/MYLANTA) 200-200-20 MG/5ML suspension Take 30 mLs by mouth every 4 (four) hours as needed for indigestion or heartburn. 03/20/22   Swayze, Ava, DO  amLODipine  (NORVASC ) 10 MG tablet Take 10 mg by mouth daily. 03/16/22   [provider]  atorvastatin (LIPITOR) 40 MG tablet Take 40 mg by mouth daily. Patient not taking: Reported on 06/03/2023    [provider]  loratadine  (CLARITIN ) 10 MG tablet Take 1 tablet (10 mg total) by mouth daily. 03/21/22   Swayze, Ava, DO  losartan -hydrochlorothiazide  (HYZAAR) 100-25 MG tablet Take 1 tablet by mouth daily. 06/23/21   Rollene Almarie LABOR, MD  methotrexate (RHEUMATREX) 2.5 MG tablet Take 7.5 mg by mouth once a week. Pt usually takes this medication on fridays  03/11/23   [provider]  Multiple Vitamin (MULTIVITAMIN WITH MINERALS) TABS tablet Take 1 tablet by mouth daily. Patient not taking: Reported on 06/03/2023    [provider]  Probiotic Product (PROBIOTIC PO) Take 1 capsule by mouth daily. Patient not taking: Reported on 06/03/2023    [provider]  tacrolimus (PROTOPIC) 0.1 % ointment 1 Application. 12/01/22   [provider]  vitamin B-12 (CYANOCOBALAMIN ) 1000 MCG tablet Take 1,000 mcg by mouth daily.    [provider]    Allergies: Plaquenil [hydroxychloroquine sulfate], Amoxicillin , Azithromycin , Lactose intolerance (gi), Penicillin g, and Hydroxychloroquine sulfate    Review of Systems  Constitutional:  Positive for activity change, appetite change, fatigue and fever.  HENT:  Positive for sore throat.   All other systems reviewed and are negative.   Updated Vital Signs BP (!) 198/97   Pulse (!) 113   Temp (!) 100.5 F (38.1 C) (Oral)   Resp 19   Wt 77.2 kg   SpO2 94%   BMI 30.15 kg/m   Physical Exam Vitals and nursing note reviewed.  Constitutional:      General: She is not in acute distress.    Appearance: She is well-developed. She is not ill-appearing, toxic-appearing or diaphoretic.  HENT:     Head: Normocephalic and atraumatic.     Mouth/Throat:     Mouth: Mucous membranes are dry.     Pharynx: Uvula midline. Pharyngeal swelling and posterior oropharyngeal erythema present.     Tonsils: No tonsillar exudate.  Comments: White coating on tongue concerning for thrush  Eyes:     Conjunctiva/sclera: Conjunctivae normal.    Cardiovascular:     Rate and Rhythm: Normal rate and regular rhythm.  Pulmonary:     Effort: Pulmonary effort is normal. No respiratory distress.     Breath sounds: Normal breath sounds. No wheezing.  Abdominal:     General: There is no distension.     Palpations: Abdomen is soft.     Tenderness: There is no abdominal tenderness.    Musculoskeletal:        General: No swelling.     Cervical back: Neck supple.   Skin:    General: Skin is warm and dry.   Neurological:     General: No focal deficit present.     Mental Status: She is alert and oriented to person, place, and time.   Psychiatric:        Mood and Affect: Mood normal.        Behavior: Behavior normal.     (all labs ordered are listed, but only abnormal results are displayed) Labs Reviewed  COMPREHENSIVE METABOLIC PANEL WITH GFR - Abnormal; Notable for the following components:      Result Value   Glucose, Bld 107 (*)    Creatinine, Ser 1.04 (*)    Total Bilirubin 1.9 (*)    GFR, Estimated 58 (*)    All other components within normal limits  CBC WITH DIFFERENTIAL/PLATELET - Abnormal; Notable for the following components:   WBC 1.8 (*)    Neutro Abs 1.2 (*)    Lymphs Abs 0.5 (*)    Monocytes Absolute 0.0 (*)    All other components within normal limits  RESP PANEL BY RT-PCR (RSV, FLU A&B, COVID)  [id number]  GROUP A STREP BY PCR  CULTURE, BLOOD (ROUTINE X 2)  CULTURE, BLOOD (ROUTINE X 2)  MRSA NEXT GEN BY PCR, NASAL  RESPIRATORY PANEL BY PCR  MRSA NEXT GEN BY PCR, NASAL  PROTIME-INR  URINALYSIS, W/ REFLEX TO CULTURE (INFECTION SUSPECTED)  CBC  BASIC METABOLIC PANEL WITH GFR  MAGNESIUM  I-STAT CG4 LACTIC ACID, ED  I-STAT CG4 LACTIC ACID, ED  TYPE AND SCREEN    EKG: None  Radiology: CT Soft Tissue Neck W Contrast Result Date: 06/30/2023 CLINICAL DATA:  Initial evaluation for acute sore throat, soft tissue infection suspected. EXAM: CT NECK WITH CONTRAST TECHNIQUE: Multidetector CT imaging of the neck was performed using the standard protocol following the bolus administration of intravenous contrast. RADIATION DOSE REDUCTION: This exam was performed according to the departmental dose-optimization program which includes automated exposure control, adjustment of the mA and/or kV according to patient size and/or use of iterative  reconstruction technique. CONTRAST:  100mL OMNIPAQUE  IOHEXOL  300 MG/ML  SOLN COMPARISON:  None available. FINDINGS: Pharynx and larynx: Oral cavity within normal limits. Mucosal edema present throughout the or pharyngeal mucosa. Epiglottis is mildly thickened and edematous in appearance as are the aryepiglottic folds. Findings suggestive of acute supraglottitis. Supraglottic airway remains patent at this time. Trace layering secretions noted within the hypopharynx. No retropharyngeal collection. Glottis within normal limits. Subglottic airway patent clear. No discrete abscess or drainable fluid collection. Salivary glands: Salivary glands including the parotid and submandibular glands are within normal limits. Thyroid : Prior right hemithyroidectomy. Left lobe of thyroid  normal. Lymph nodes: No enlarged or pathologic lymph nodes within the neck. Vascular: Normal intravascular enhancement seen within the neck. Mild aortic atherosclerosis. Limited intracranial: Probable small remote left cerebellar infarct. Otherwise  unremarkable. Visualized orbits: Scleral buckle in place at the right globe. Globes orbital soft tissues otherwise unremarkable. Mastoids and visualized paranasal sinuses: Visualized paranasal sinuses are largely clear. Mastoid air cells and middle ear cavities are well pneumatized and free of fluid. Skeleton: No worrisome osseous lesions. Mild to moderate cervical spondylosis, most pronounced at C4-5. Patient is edentulous. Upper chest: No other acute finding. Linear atelectasis and/or scarring present at the posterior right upper lobe. Other: None. IMPRESSION: 1. Findings suggestive of acute epiglottitis/supraglottitis as above. Supraglottic airway remains patent at this time. No discrete abscess or drainable fluid collection. 2. No other acute abnormality within the neck. 3.  Aortic Atherosclerosis (ICD10-I70.0). Electronically Signed   By: Morene Hoard M.D.   On: 06/30/2023 19:21   CT CHEST  ABDOMEN PELVIS W CONTRAST Result Date: 06/30/2023 CLINICAL DATA:  Sepsis PT reports sore throat, abdominal pain and chills. Pt does undergo immunotherapy for lupus. Pt has a fever of 102.9 during triage. EXAM: CT CHEST, ABDOMEN, AND PELVIS WITH CONTRAST TECHNIQUE: Multidetector CT imaging of the chest, abdomen and pelvis was performed following the standard protocol during bolus administration of intravenous contrast. RADIATION DOSE REDUCTION: This exam was performed according to the departmental dose-optimization program which includes automated exposure control, adjustment of the mA and/or kV according to patient size and/or use of iterative reconstruction technique. CONTRAST:  OMNIPAQUE  IOHEXOL  300 MG/ML  SOLN COMPARISON:  None Available. FINDINGS: CT CHEST FINDINGS Cardiovascular: Normal heart size. No significant pericardial effusion. The thoracic aorta is normal in caliber. Mild-to-moderate atherosclerotic plaque of the thoracic aorta. No coronary artery calcifications. Mediastinum/Nodes: No enlarged mediastinal, hilar, or axillary lymph nodes. Thyroid  gland, trachea, and esophagus demonstrate no significant findings. Partial right thyroidectomy. Tiny hiatal hernia. Lungs/Pleura: No focal consolidation. No pulmonary nodule. No pulmonary mass. No pleural effusion. No pneumothorax. Musculoskeletal: No chest wall abnormality. Non-wireless left chest wall cardiac device. No suspicious lytic or blastic osseous lesions. No acute displaced fracture. Multilevel mild degenerative change of the spine. CT ABDOMEN PELVIS FINDINGS Hepatobiliary: No focal liver abnormality. No gallstones, gallbladder wall thickening, or pericholecystic fluid. No biliary dilatation. Pancreas: No focal lesion. Normal pancreatic contour. No surrounding inflammatory changes. No main pancreatic ductal dilatation. Spleen: Normal in size without focal abnormality. Adrenals/Urinary Tract: No adrenal nodule bilaterally. Bilateral kidneys  enhance symmetrically. No hydronephrosis. No hydroureter. 4 mm right nephrolithiasis. No left nephrolithiasis. Bilateral parapelvic simple cysts - simple renal cysts, in the absence of clinically indicated signs/symptoms, require no independent follow-up. The urinary bladder is unremarkable. Stomach/Bowel: Stomach is within normal limits. No evidence of bowel wall thickening or dilatation. Appendix appears normal. Vascular/Lymphatic: No abdominal aorta or iliac aneurysm. No abdominal, pelvic, or inguinal lymphadenopathy. Reproductive: Not well visualized due to streak artifact originating from the left femoral surgical hardware. Other: No intraperitoneal free fluid. No intraperitoneal free gas. No organized fluid collection. Musculoskeletal: No abdominal wall hernia or abnormality. No suspicious lytic or blastic osseous lesions. No acute displaced fracture. L3 vertebral body hemangioma. IMPRESSION: No acute intrathoracic, intra-abdominal, or intrapelvic abnormality. Electronically Signed   By: Morgane  Naveau M.D.   On: 06/30/2023 19:12   DG Chest Port 1 View Result Date: 06/30/2023 CLINICAL DATA:  [id number] Sepsis [facility]) [id number] EXAM: PORTABLE CHEST - 1 VIEW COMPARISON:  Jun 03, 2023 FINDINGS: Prominence of the right paratracheal stripe and right hilar region. Lower lung volumes. No focal airspace consolidation, pleural effusion, or pneumothorax. No cardiomegaly. No acute fracture or destructive lesion. IMPRESSION: Lower lung volumes. Prominence of the right lower paratracheal  stripe and right hilar region, which may be artifactual due to low lung volumes and patient rotation. Alternatively, perihilar airspace disease or developing lymphadenopathy could have this appearance. Nonemergent chest CT should be considered for further characterization. Electronically Signed   By: Rogelia Myers M.D.   On: 06/30/2023 16:32     Procedures   Medications Ordered in the ED  acetaminophen  (TYLENOL ) tablet 650 mg (650 mg  Oral Patient Refused/Not Given 06/30/23 1522)  lactated ringers  infusion ( Intravenous New Bag/Given 06/30/23 1929)  nystatin (MYCOSTATIN) 100000 UNIT/ML suspension 500,000 Units (500,000 Units Oral Not Given 06/30/23 2157)  docusate sodium  (COLACE) capsule 100 mg (has no administration in time range)  polyethylene glycol (MIRALAX  / GLYCOLAX ) packet 17 g (has no administration in time range)  ondansetron  (ZOFRAN ) injection 4 mg (has no administration in time range)  albuterol (PROVENTIL) (2.5 MG/3ML) 0.083% nebulizer solution 2.5 mg (2.5 mg Nebulization Given 06/30/23 2217)  albuterol (PROVENTIL) (2.5 MG/3ML) 0.083% nebulizer solution 2.5 mg (has no administration in time range)  dexamethasone  (DECADRON ) injection 10 mg (has no administration in time range)  diphenhydrAMINE  (BENADRYL ) injection 12.5 mg (12.5 mg Intravenous Given 06/30/23 2209)  Ampicillin-Sulbactam (UNASYN) 3 g in sodium chloride  0.9 % 100 mL IVPB (has no administration in time range)  Chlorhexidine  Gluconate Cloth 2 % PADS 6 each (has no administration in time range)  Oral care mouth rinse (has no administration in time range)  acetaminophen  (TYLENOL ) 160 MG/5ML solution 650 mg (650 mg Oral Given 06/30/23 1537)  lactated ringers  bolus 1,000 mL (0 mLs Intravenous Stopped 06/30/23 1928)    And  lactated ringers  bolus 1,000 mL (0 mLs Intravenous Stopped 06/30/23 1841)    And  lactated ringers  bolus 500 mL (0 mLs Intravenous Stopped 06/30/23 1928)  ceFEPIme  (MAXIPIME ) 2 g in sodium chloride  0.9 % 100 mL IVPB (0 g Intravenous Stopped 06/30/23 1837)  metroNIDAZOLE (FLAGYL) IVPB 500 mg (0 mg Intravenous Stopped 06/30/23 2150)  vancomycin  (VANCOCIN ) IVPB 1000 mg/200 mL premix (0 mg Intravenous Stopped 06/30/23 1839)  fentaNYL  (SUBLIMAZE ) injection 50 mcg (50 mcg Intravenous Given 06/30/23 1648)  iohexol  (OMNIPAQUE ) 300 MG/ML solution 100 mL (100 mLs Intravenous Contrast Given 06/30/23 1831)  fentaNYL  (SUBLIMAZE ) injection 100 mcg (100 mcg  Intravenous Given 06/30/23 2054)  Racepinephrine HCl 2.25 % nebulizer solution 0.5 mL (0.5 mLs Nebulization Given 06/30/23 2112)  dexamethasone  (DECADRON ) injection 10 mg (10 mg Intravenous Given 06/30/23 2057)                                    Medical Decision Making Amount and/or Complexity of Data Reviewed Labs: ordered. Radiology: ordered.  Risk OTC drugs. Prescription drug management. Decision regarding hospitalization.   This patient presents to the ED for concern of sore throat and fever, this involves an extensive number of treatment options, and is a complaint that carries with it a high risk of complications and morbidity.  The differential diagnosis includes pharyngitis, RPA, PTA, tracheitis, epiglottitis, other infection   Co morbidities / Chronic conditions that complicate the patient evaluation  SLE, anemia, osteopenia, CVA, HTN, prediabetes   Additional history obtained:  Additional history obtained from EMR External records from outside source obtained and reviewed including N/A   Lab Tests:  I Ordered, and personally interpreted labs.  The pertinent results include: Leukopenia is present.  Lab work is otherwise unremarkable.   Imaging Studies ordered:  I ordered imaging studies including x-ray, CT  of neck, chest, abdomen, pelvis I independently visualized and interpreted imaging which showed findings suggestive of epiglottitis/supraglottitis I agree with the radiologist interpretation   Cardiac Monitoring: / EKG:  The patient was maintained on a cardiac monitor.  I personally viewed and interpreted the cardiac monitored which showed an underlying rhythm of: Sinus rhythm   Problem List / ED Course / Critical interventions / Medication management  Patient presenting for sore throat and fever since yesterday.  On arrival, patient is febrile at 102.9 degrees.  She is tachycardic.  She meets sepsis criteria.  Given her immunocompromise state secondary to  immunosuppressant drugs taken for SLE, septic workup and treatment were initiated.  On exam, she is overall well-appearing.  She does have some mild swelling and erythema to her oropharynx.  She has a white coating on tongue concerning for thrush.  Her abdomen soft and nontender.  She received Tylenol  in the ED waiting room.  Fentanyl  ordered for further analgesia.  Nystatin oral suspension ordered for thrush.  Patient's lab work today notable for leukopenia.  On CT imaging, patient was found to have swelling and inflammation of epiglottic/supraglottic area.  There was concern of epiglottitis.  On reassessment, patient currently without any breathing difficulty.  She does have ongoing pain.  Fentanyl  was ordered.  Will trial racemic epi as well.  I spoke with otolaryngologist on-call, Dr. Llewellyn, who recommends Decadron .  She does not feel the patient needs emergent laryngoscopic.  Decadron  was ordered.  Patient was admitted to ICU for close observation. I ordered medication including IV fluids and broad-spectrum antibiotics for empiric treatment of sepsis; Tylenol  for antipyresis; fentanyl  for analgesia; Decadron  and racemic epi for epiglottitis Reevaluation of the patient after these medicines showed that the patient stayed the same I have reviewed the patients home medicines and have made adjustments as needed   Consultations Obtained:  I requested consultation with the otolaryngologist, Dr. Llewellyn,  and discussed lab and imaging findings as well as pertinent plan - they recommend: Acute 8-hour 10 mg Decadron , antibiotics, no need for emergent laryngoscopy. I requested consultation with the intensivist, Dr. Layman,  and discussed lab and imaging findings as well as pertinent plan - they recommend: ICU admission   Social Determinants of Health:  Lives independently  CRITICAL CARE Performed by: Bernardino Fireman   Total critical care time: 34 minutes  Critical care time was exclusive of  separately billable procedures and treating other patients.  Critical care was necessary to treat or prevent imminent or life-threatening deterioration.  Critical care was time spent personally by me on the following activities: development of treatment plan with patient and/or surrogate as well as nursing, discussions with consultants, evaluation of patient's response to treatment, examination of patient, obtaining history from patient or surrogate, ordering and performing treatments and interventions, ordering and review of laboratory studies, ordering and review of radiographic studies, pulse oximetry and re-evaluation of patient's condition.       Final diagnoses:  Epiglottitis  Sepsis without acute organ dysfunction, due to unspecified organism [facility])    ED Discharge Orders     None          Fireman Bernardino, MD 06/30/23 2250

## 2023-06-30 NOTE — ED Triage Notes (Signed)
 Pt arrives via POV. PT reports sore throat, abdominal pain and chills. Pt does undergo immunotherapy for lupus. Pt has a fever of 102.9 during triage.

## 2023-06-30 NOTE — ED Notes (Signed)
 2nd attempt to collect UA, pt says she is unable to give specimen

## 2023-07-01 DIAGNOSIS — J0510 Acute epiglottitis without obstruction: Secondary | ICD-10-CM | POA: Diagnosis not present

## 2023-07-01 DIAGNOSIS — M329 Systemic lupus erythematosus, unspecified: Secondary | ICD-10-CM | POA: Diagnosis not present

## 2023-07-01 LAB — RESPIRATORY PANEL BY PCR

## 2023-07-01 LAB — CBC
HCT: 38.1 % (ref 36.0–46.0)
Hemoglobin: 12.2 g/dL (ref 12.0–15.0)
MCH: 30.1 pg (ref 26.0–34.0)
MCHC: 32 g/dL (ref 30.0–36.0)
MCV: 94.1 fL (ref 80.0–100.0)
Platelets: 113 10*3/uL — ABNORMAL LOW (ref 150–400)
RBC: 4.05 MIL/uL (ref 3.87–5.11)
RDW: 15.1 % (ref 11.5–15.5)
WBC: 3.5 10*3/uL — ABNORMAL LOW (ref 4.0–10.5)
nRBC: 0 % (ref 0.0–0.2)

## 2023-07-01 LAB — BASIC METABOLIC PANEL WITH GFR
Anion gap: 12 (ref 5–15)
BUN: 10 mg/dL (ref 8–23)
CO2: 20 mmol/L — ABNORMAL LOW (ref 22–32)
Calcium: 8.8 mg/dL — ABNORMAL LOW (ref 8.9–10.3)
Chloride: 107 mmol/L (ref 98–111)
Creatinine, Ser: 0.82 mg/dL (ref 0.44–1.00)
GFR, Estimated: >60 mL/min (ref >60)
Glucose, Bld: 184 mg/dL — ABNORMAL HIGH (ref 70–99)
Potassium: 3.7 mmol/L (ref 3.5–5.1)
Sodium: 139 mmol/L (ref 135–145)

## 2023-07-01 LAB — MAGNESIUM: Magnesium: 1.8 mg/dL (ref 1.7–2.4)

## 2023-07-01 LAB — CULTURE, BLOOD (ROUTINE X 2)

## 2023-07-01 MED ORDER — LABETALOL HCL 5 MG/ML IV SOLN
10.0000 mg | INTRAVENOUS | Status: DC | PRN
  Administered 2023-07-01 (×4): 20 mg via INTRAVENOUS
  Filled 2023-07-01 (×4): qty 4

## 2023-07-01 MED ORDER — FLUCONAZOLE IN SODIUM CHLORIDE 200-0.9 MG/100ML-% IV SOLN
200.0000 mg | INTRAVENOUS | Status: DC
  Administered 2023-07-01 – 2023-07-04 (×4): 200 mg via INTRAVENOUS
  Filled 2023-07-01 (×6): qty 100

## 2023-07-01 MED ORDER — VITAMIN B-12 1000 MCG PO TABS
1000.0000 ug | ORAL_TABLET | Freq: Every day | ORAL | Status: DC
  Administered 2023-07-01 – 2023-07-05 (×5): 1000 ug via ORAL
  Filled 2023-07-01 (×5): qty 1

## 2023-07-01 MED ORDER — BUTAMBEN-TETRACAINE-BENZOCAINE 2-2-14 % EX AERO
1.0000 | INHALATION_SPRAY | CUTANEOUS | Status: DC | PRN
  Administered 2023-07-01 (×4): 1 via TOPICAL
  Filled 2023-07-01 (×2): qty 20

## 2023-07-01 MED ORDER — DOCUSATE SODIUM 50 MG/5ML PO LIQD
100.0000 mg | Freq: Two times a day (BID) | ORAL | Status: DC | PRN
  Administered 2023-07-01: 100 mg via ORAL
  Filled 2023-07-01 (×2): qty 10

## 2023-07-01 MED ORDER — ROSUVASTATIN CALCIUM 10 MG PO TABS
10.0000 mg | ORAL_TABLET | Freq: Every day | ORAL | Status: DC
  Administered 2023-07-02 – 2023-07-04 (×3): 10 mg via ORAL
  Filled 2023-07-01 (×3): qty 1

## 2023-07-01 MED ORDER — DEXAMETHASONE SODIUM PHOSPHATE 10 MG/ML IJ SOLN
10.0000 mg | Freq: Three times a day (TID) | INTRAMUSCULAR | Status: DC
  Administered 2023-07-01 – 2023-07-03 (×6): 10 mg via INTRAVENOUS
  Filled 2023-07-01 (×7): qty 1

## 2023-07-01 MED ORDER — CARMEX CLASSIC LIP BALM EX OINT
1.0000 | TOPICAL_OINTMENT | CUTANEOUS | Status: DC | PRN
  Administered 2023-07-01 – 2023-07-03 (×2): 1 via TOPICAL
  Filled 2023-07-01 (×2): qty 10

## 2023-07-01 MED ORDER — THIAMINE MONONITRATE 100 MG PO TABS
50.0000 mg | ORAL_TABLET | Freq: Every day | ORAL | Status: DC
Start: 2023-07-01 — End: 2023-07-05
  Administered 2023-07-01 – 2023-07-05 (×5): 50 mg via ORAL
  Filled 2023-07-01 (×6): qty 1

## 2023-07-01 MED ORDER — AMLODIPINE BESYLATE 10 MG PO TABS
10.0000 mg | ORAL_TABLET | Freq: Every day | ORAL | Status: DC
  Administered 2023-07-01 – 2023-07-05 (×5): 10 mg via ORAL
  Filled 2023-07-01 (×5): qty 1

## 2023-07-01 MED ORDER — LOSARTAN POTASSIUM 50 MG PO TABS
100.0000 mg | ORAL_TABLET | Freq: Every day | ORAL | Status: DC
  Administered 2023-07-01 – 2023-07-05 (×5): 100 mg via ORAL
  Filled 2023-07-01 (×5): qty 2

## 2023-07-01 MED ORDER — HYDROCHLOROTHIAZIDE 25 MG PO TABS
25.0000 mg | ORAL_TABLET | Freq: Every day | ORAL | Status: DC
  Administered 2023-07-01 – 2023-07-03 (×3): 25 mg via ORAL
  Filled 2023-07-01 (×3): qty 1

## 2023-07-01 MED ORDER — LOSARTAN POTASSIUM-HCTZ 100-25 MG PO TABS: 1.0000 | ORAL_TABLET | Freq: Every day | ORAL | Status: DC

## 2023-07-01 MED ORDER — HYDROMORPHONE HCL 1 MG/ML IJ SOLN
0.5000 mg | INTRAMUSCULAR | Status: DC | PRN
  Administered 2023-07-01: 0.5 mg via INTRAVENOUS
  Filled 2023-07-01: qty 1

## 2023-07-01 MED ORDER — BIOTENE DRY MOUTH MT LIQD
15.0000 mL | OROMUCOSAL | Status: DC | PRN
  Administered 2023-07-01: 15 mL via OROMUCOSAL
  Filled 2023-07-01: qty 237

## 2023-07-01 NOTE — Progress Notes (Signed)
 NAME:  Elaine Allen, MRN:  [id number], DOB:  1955-10-05, LOS: 1 ADMISSION DATE:  06/30/2023, CONSULTATION DATE:  07/01/23 REFERRING MD:  EDP, CHIEF COMPLAINT:  sore throat   History of Present Illness:  68 yo female presents with progressive worsening of sore throat. Pt is historically on multiple immunosuppressants and occasional steroids. She endorses fever prior to presentation and additionally has a temp of 102.9 at admission. Pt states she has had intermittent fevers without explanation and recently resulted in admission 1 month ago. 06/29/23 she developed a sore throat and repeated fever. She has been unable to eat or drink as well as states it has been difficult for her to talk 2/2 pain. She denies ha/chills/n/v/d. No known recent sick contacts.  ENT was contacted by EDP and recommended CCM admission with scheduled steroids q8h and abx, no recommendation for emergent laryngoscopy.   Ccm was asked for admission to monitor for airway compromise.   Pertinent  Medical History  Lupus Htn Hyperlipidemia Chronic allergies  Significant Hospital Events: Including procedures, antibiotic start and stop dates in addition to other pertinent events   6/25 Admitted to ICU for close airway monitoring for acute epiglottis  6/26 No issues overnight, no stridor on exam   Interim History / Subjective:  Seen sitting up in bed with no acute complaints, states throat is still sore but pain has lessened   Objective    Blood pressure (!) 159/83, pulse 82, temperature 98.3 F (36.8 C), temperature source Oral, resp. rate 20, height 5' 3 (1.6 m), weight 77.2 kg, SpO2 99%.        Intake/Output Summary (Last 24 hours) at 07/01/2023 0717 Last data filed at 07/01/2023 0409 Gross per 24 hour  Intake 3448.99 ml  Output --  Net 3448.99 ml   Filed Weights   06/30/23 2137 06/30/23 2317  Weight: 77.2 kg 77.2 kg    Examination: General: Acute on chronically ill appearing middle aged female lying  in bed, in NAD HEENT: [place]/AT, MM pink/moist, PERRL,  Neuro: Alert and oriented x3, non-focal  CV: s1s2 regular rate and rhythm, no murmur, rubs, or gallops,  PULM:  Clear to auscultation, no increased work of breathing, no added breath sounds, on RA, no stridor  GI: soft, bowel sounds active in all 4 quadrants, non-tender, non-distended Extremities: warm/dry, no edema  Skin: no rashes or lesions  Resolved problem list   Assessment and Plan  Acute epiglottis Pharyngitis, acute -CT on admit with acute epiglottitis/supraglottitis, supraglottic airway remains patent with no discrete abscess or drainable fluid collection. P: Remains on RA this am with no signs of distress ENT following, appreciate assistance  Continue steroids  Liquid diet  Aspiration precautions  Multimodal pain control  Empiric Unasyn   Oral thrush  P: Nystatin swish and swallow   Lupus with chronic immunosuppression  -Patient is on Methotrexate at baseline  P: Hold home Methotrexate, resume when appropriate   Anemia of chronic disease  P: Trend CBC  Transfuse per protocol  Hgb goal > 7  H/o CVA P: Supportive care   Essential hypertension  Hyperlipidemia  -Home medications include Norvasc , Hyzaar, Crestor  P: Resume home medications if unable to swallow consider IV alternative  As needed IV antihypertensives  Continuous telemetry   Best Practice (right click and Reselect all SmartList Selections daily)   Diet/type: NPO w/ oral meds DVT prophylaxis SCD Pressure ulcer(s): N/A GI prophylaxis: N/A Lines: N/A Foley:  N/A Code Status:  full code Last date of multidisciplinary  goals of care discussion [d/w pt she requests all life saving interventions]  Critical care time: NA  Aniyah Nobis D. Harris, NP-C [facility] Personal contact information can be found on Amion  If no contact or response made please call [phone] 07/01/2023, 7:43 AM

## 2023-07-01 NOTE — Progress Notes (Signed)
 eLink Physician-Brief Progress Note Patient Name: Elaine Allen DOB: 03-Apr-1955 MRN: [id number]   Date of Service  07/01/2023  HPI/Events of Note  Patient is complaining of throat pain not relieved by iv Fentanyl  / Tylenol . She is Npo except for medications with sips of water, she is not drooling.  eICU Interventions  Trial of PRN Cetacaine spray.        Elaine Allen Elaine Allen 07/01/2023, 1:23 AM

## 2023-07-01 NOTE — Plan of Care (Signed)
  Problem: Clinical Measurements: Goal: Respiratory complications will improve Outcome: Progressing Goal: Cardiovascular complication will be avoided Outcome: Progressing   Problem: Activity: Goal: Risk for activity intolerance will decrease Outcome: Progressing   Problem: Elimination: Goal: Will not experience complications related to urinary retention Outcome: Progressing   

## 2023-07-02 DIAGNOSIS — J0510 Acute epiglottitis without obstruction: Secondary | ICD-10-CM | POA: Diagnosis not present

## 2023-07-02 MED ORDER — DEXAMETHASONE SODIUM PHOSPHATE 10 MG/ML IJ SOLN
10.0000 mg | Freq: Once | INTRAMUSCULAR | Status: AC
  Administered 2023-07-02: 10 mg via INTRAVENOUS
  Filled 2023-07-02: qty 1

## 2023-07-02 MED ORDER — NYSTATIN 100000 UNIT/ML MT SUSP
5.0000 mL | Freq: Four times a day (QID) | OROMUCOSAL | Status: DC
  Administered 2023-07-02 – 2023-07-05 (×12): 500000 [IU] via ORAL
  Filled 2023-07-02 (×12): qty 5

## 2023-07-02 MED ORDER — DIPHENHYDRAMINE HCL 50 MG/ML IJ SOLN
12.5000 mg | Freq: Once | INTRAMUSCULAR | Status: AC
  Administered 2023-07-02: 12.5 mg via INTRAVENOUS
  Filled 2023-07-02: qty 1

## 2023-07-02 MED ORDER — SODIUM CHLORIDE 0.9 % IV SOLN
3.0000 g | Freq: Four times a day (QID) | INTRAVENOUS | Status: DC
  Administered 2023-07-02 – 2023-07-05 (×12): 3 g via INTRAVENOUS
  Filled 2023-07-02 (×14): qty 8

## 2023-07-02 MED ORDER — ALBUTEROL SULFATE (2.5 MG/3ML) 0.083% IN NEBU
2.5000 mg | INHALATION_SOLUTION | Freq: Three times a day (TID) | RESPIRATORY_TRACT | Status: DC
  Administered 2023-07-02 (×2): 2.5 mg via RESPIRATORY_TRACT
  Filled 2023-07-02 (×2): qty 3

## 2023-07-02 MED ORDER — LINEZOLID 600 MG/300ML IV SOLN
600.0000 mg | Freq: Two times a day (BID) | INTRAVENOUS | Status: DC
  Administered 2023-07-02 – 2023-07-03 (×4): 600 mg via INTRAVENOUS
  Filled 2023-07-02 (×5): qty 300

## 2023-07-02 MED ORDER — ALBUTEROL SULFATE (2.5 MG/3ML) 0.083% IN NEBU
2.5000 mg | INHALATION_SOLUTION | Freq: Two times a day (BID) | RESPIRATORY_TRACT | Status: DC
  Filled 2023-07-02: qty 3

## 2023-07-02 NOTE — Progress Notes (Addendum)
 NAME:  Elaine Allen, MRN:  [id number], DOB:  03/08/1955, LOS: 2 ADMISSION DATE:  06/30/2023, CONSULTATION DATE:  07/01/23 REFERRING MD:  EDP, CHIEF COMPLAINT:  sore throat   History of Present Illness:  68 yo female presents with progressive worsening of sore throat. Pt is historically on multiple immunosuppressants and occasional steroids. She endorses fever prior to presentation and additionally has a temp of 102.9 at admission. Pt states she has had intermittent fevers without explanation and recently resulted in admission 1 month ago. 06/29/23 she developed a sore throat and repeated fever. She has been unable to eat or drink as well as states it has been difficult for her to talk 2/2 pain. She denies ha/chills/n/v/d. No known recent sick contacts.  ENT was contacted by EDP and recommended CCM admission with scheduled steroids q8h and abx, no recommendation for emergent laryngoscopy.   Ccm was asked for admission to monitor for airway compromise.   Pertinent  Medical History  Lupus Htn Hyperlipidemia Chronic allergies     has a past medical history of Arthritis, Blind right eye, Chronic anemia, Detached retina, right, History of kidney stones, Hypertension, Hyperthyroidism, Lupus, Pneumonia, Retinal detachment, Spasmodic dysphonia, Stroke (HCC) (2015), and Vertigo.   reports that she has never smoked. She has never been exposed to tobacco smoke. She has never used smokeless tobacco.  Past Surgical History:  Procedure Laterality Date   ABDOMINAL HYSTERECTOMY     BREAST EXCISIONAL BIOPSY Left    KNEE SURGERY Left    THYROID  SURGERY     TOTAL HIP ARTHROPLASTY Left 06/27/2018   Procedure: TOTAL HIP ARTHROPLASTY ANTERIOR APPROACH;  Surgeon: Liam Lerner, MD;  Location: [facility];  Service: Orthopedics;  Laterality: Left;    Allergies  Allergen Reactions   Plaquenil [Hydroxychloroquine Sulfate] Anaphylaxis and Other (See Comments)    Blisters all over body   Amoxicillin  Other  (See Comments)   Azithromycin  Nausea And Vomiting   Lactose Intolerance (Gi) Other (See Comments)    Upset stomach   Penicillin G Itching and Nausea Only   Hydroxychloroquine Sulfate Other (See Comments) and Rash    Immunization History  Administered Date(s) Administered   Fluad Quad(high Dose 65+) 10/21/2018, 09/17/2020   Hep A / Hep B 03/08/2017   Influenza Whole 12/19/2007   Influenza,inj,Quad PF,6+ Mos 11/12/2014, 10/01/2015, 10/28/2016, 02/09/2018   Influenza-Unspecified 11/15/2013   PFIZER(Purple Top)SARS-COV-2 Vaccination 06/06/2019, 07/06/2019   PPD Test 09/12/2013, 01/09/2015, 03/18/2016, 03/23/2016, 08/10/2017   Pneumococcal Conjugate-13 10/21/2018   Pneumococcal Polysaccharide-23 03/14/2020   Tdap 12/05/2014   Zoster Recombinant(Shingrix) 06/24/2020   Zoster, Live 01/02/2015    Family History  Problem Relation Age of Onset   Cancer Mother        liver   Diabetes Mother    Arthritis Mother    Cancer Father        prostate,METS, stomach    Ovarian cancer Sister    Throat cancer Brother      Current Facility-Administered Medications:    albuterol  (PROVENTIL ) (2.5 MG/3ML) 0.083% nebulizer solution 2.5 mg, 2.5 mg, Nebulization, Q2H PRN, Layman Raisin, DO   albuterol  (PROVENTIL ) (2.5 MG/3ML) 0.083% nebulizer solution 2.5 mg, 2.5 mg, Nebulization, TID, Desai, Nikita S, MD, 2.5 mg at 07/02/23 9166   amLODipine  (NORVASC ) tablet 10 mg, 10 mg, Oral, Daily, Harris, Whitney D, NP, 10 mg at 07/02/23 9085   Ampicillin -Sulbactam (UNASYN ) 3 g in sodium chloride  0.9 % 100 mL IVPB, 3 g, Intravenous, Q8H, Marshall, Jessica, DO, Last Rate: 200 mL/hr at  07/02/23 0904, 3 g at 07/02/23 0904   antiseptic oral rinse (BIOTENE) solution 15 mL, 15 mL, Mouth Rinse, PRN, Desai, Nikita S, MD, 15 mL at 07/01/23 2215   butamben -tetracaine -benzocaine  (CETACAINE ) spray 1 spray, 1 spray, Topical, Q4H PRN, Ogan, Okoronkwo U, MD, 1 spray at 07/01/23 2129   Chlorhexidine  Gluconate Cloth 2 % PADS 6  each, 6 each, Topical, Daily, Layman Raisin, DO, 6 each at 07/01/23 0315   cyanocobalamin  (VITAMIN B12) tablet 1,000 mcg, 1,000 mcg, Oral, Daily, Arloa Folks D, NP, 1,000 mcg at 07/02/23 9085   dexamethasone  (DECADRON ) injection 10 mg, 10 mg, Intravenous, Q8H, Layman Raisin, DO, 10 mg at 07/01/23 2130   dexamethasone  (DECADRON ) injection 10 mg, 10 mg, Intravenous, Once, Carolee Rosaline DASEN, RPH   diphenhydrAMINE  (BENADRYL ) injection 12.5 mg, 12.5 mg, Intravenous, Q8H, Marshall, Jessica, DO, 12.5 mg at 07/01/23 2129   diphenhydrAMINE  (BENADRYL ) injection 12.5 mg, 12.5 mg, Intravenous, Once, Bell, Michelle T, RPH   docusate (COLACE) 50 MG/5ML liquid 100 mg, 100 mg, Oral, BID PRN, Desai, Nikita S, MD, 100 mg at 07/01/23 2214   fluconazole  (DIFLUCAN ) IVPB 200 mg, 200 mg, Intravenous, Q24H, Desai, Nikita S, MD, Stopped at 07/01/23 1432   hydrALAZINE  (APRESOLINE ) injection 10-20 mg, 10-20 mg, Intravenous, Q4H PRN, Ogan, Okoronkwo U, MD, 20 mg at 07/01/23 1420   losartan  (COZAAR ) tablet 100 mg, 100 mg, Oral, Daily, 100 mg at 07/02/23 0914 **AND** hydrochlorothiazide  (HYDRODIURIL ) tablet 25 mg, 25 mg, Oral, Daily, Mark Bard LABOR, RPH, 25 mg at 07/02/23 9085   HYDROmorphone  (DILAUDID ) injection 0.5 mg, 0.5 mg, Intravenous, Q2H PRN, Desai, Nikita S, MD, 0.5 mg at 07/01/23 1736   labetalol  (NORMODYNE ) injection 10-20 mg, 10-20 mg, Intravenous, Q2H PRN, Ogan, Okoronkwo U, MD, 20 mg at 07/01/23 1715   lip balm (CARMEX) ointment 1 Application, 1 Application, Topical, PRN, Desai, Nikita S, MD, 1 Application at 07/01/23 2129   ondansetron  (ZOFRAN ) injection 4 mg, 4 mg, Intravenous, Q6H PRN, Layman Raisin, DO   Oral care mouth rinse, 15 mL, Mouth Rinse, PRN, Layman Raisin, DO   polyethylene glycol (MIRALAX  / GLYCOLAX ) packet 17 g, 17 g, Oral, Daily PRN, Layman Raisin, DO   rosuvastatin  (CRESTOR ) tablet 10 mg, 10 mg, Oral, QHS, Harris, Whitney D, NP   thiamine  (VITAMIN B1) tablet 50 mg, 50 mg,  Oral, Daily, Harris, Whitney D, NP, 50 mg at 07/02/23 [facility]   Significant Hospital Events: Including procedures, antibiotic start and stop dates in addition to other pertinent events   6/25 Admitted to ICU for close airway monitoring for acute epiglottis  6/26 No issues overnight, no stridor on exam  - Seen sitting up in bed with no acute complaints, states throat is still sore but pain has lessened   Interim History / Subjective:   6/27  - says only some better . Able to drink water and crushed pills but not solids.  DEnies she has had ENT eval. STill on decadron , unasyn  and diflucan    Objective    Blood pressure (!) 146/72, pulse (!) 109, temperature 98.7 F (37.1 C), temperature source Oral, resp. rate 16, height 5' 3 (1.6 m), weight 77.2 kg, SpO2 100%.        Intake/Output Summary (Last 24 hours) at 07/02/2023 1042 Last data filed at 07/02/2023 0200 Gross per 24 hour  Intake 1021.67 ml  Output --  Net 1021.67 ml   Filed Weights   06/30/23 2137 06/30/23 2317  Weight: 77.2 kg 77.2 kg    Examination: General: No distress.  Looks well Neuro: Alert and Oriented x 3. GCS 15. Speech normal Psych: Pleasant Resp:  Barrel Chest - no.  Wheeze - no, Crackles - no, No overt respiratory distress CVS: Normal heart sounds. Murmurs - no Ext: Stigmata of Connective Tissue Disease - no  HEENT: Normal upper airway. PEERL +. No post nasal drip. NO HORASEMENTESs   Assessment and Plan  Acute epiglottis + Pharyngitis, acute - -CT on admit with acute epiglottitis/supraglottitis, supraglottic airway remains patent with no discrete abscess or drainable fluid collection.  6/27 - some improvement only but no stridor , no distress. Voice appears normal  P: Consider ENT larynbgoscopy eval due to slow improvement - D/w DR Poklrel Continue steroids  Liquid diet  Aspiration precautions  Multimodal pain control  Empiric Unasyn   -. Will discuss with pharmacy if need to expand coverage -> d/w EMily  sincilair - > add Linezold     Oral thrush  P: Nystatin  swish and swallow   Lupus with chronic immunosuppression - -Patient is on Methotrexate at baseline  P: Hold home Methotrexate, resume when appropriate   Anemia of chronic disease  P: Trend CBC  Transfuse per protocol  Hgb goal > 7  H/o CVA P: Supportive care   Essential hypertension  Hyperlipidemia  -Home medications include Norvasc , Hyzaar, Crestor   P: Resume home medications if unable to swallow consider IV alternative  As needed IV antihypertensives  Continuous telemetry   Best Practice (right click and Reselect all SmartList Selections daily)   Per [facility]    SIGNATURE    Dr. Dorethia Cave, M.D., F.C.C.P,  Pulmonary and Critical Care Medicine Staff Physician, [facility] Director - Interstitial Lung Disease  Program  Pulmonary Fibrosis [facility] [place], [place], [address]   Pager: [phone], If no answer  -> Check AMION or Try [phone] Telephone (clinical office): [phone] Telephone (research): [phone]  10:54 AM 07/02/2023

## 2023-07-02 NOTE — Progress Notes (Signed)
   07/02/23 0911  TOC Brief Assessment  Insurance and Status Reviewed  Patient has primary care physician Yes  Home environment has been reviewed Resides alone in an apartment  Prior level of function: Independent with ADLs at baseline  Prior/Current Home Services No current home services  Social Drivers of Health Review SDOH reviewed no interventions necessary  Readmission risk has been reviewed Yes  Transition of care needs no transition of care needs at this time

## 2023-07-02 NOTE — Progress Notes (Signed)
 PROGRESS NOTE  Elaine Allen Deiter FMW:[id number] DOB: [birth year] DOA: 06/30/2023 PCP: [name], NP   LOS: 2 days   Brief narrative:  Patient is a 68 yo female with history of lupus on multiple immunosuppressants and steroids presented to the hospital with progressive sore throat fever.  History of admission a month back for fevers.  At this time patient was unable to eat or drink and had difficulty talking due to throat pain.  ENT was contacted by ED provider and patient was admitted to hospital for steroids and antibiotics.  Patient was initially admitted to the ICU service and was considered be stable for transfer out of the ICU.   Assessment/Plan: Principal Problem:   Epiglottitis  Acute epiglottis Pharyngitis, acute CT scanning of the soft tissue of the neck on admit with acute epiglottitis/supraglottitis, supraglottic airway remains patent with no discrete abscess or drainable fluid collection.  Received IV steroids.  Aspiration precautions.  On empiric Unasyn  and pharmacy to add linezolid as well.  Blood cultures negative in 2 days.  Patient denies any stridor or respiratory difficulty but occasionally has some swallowing trouble with pain on swallowing.  Has extensive oral thrush as well.  Will add fluconazole .  Communicated with pulmonary.  Will get ENT involvement if patient not improved.  Will broaden the spectrum of antibiotic to Unasyn  and linezolid today.  On IV steroids.  Oral thrush extensive, possibility of candidal esophagitis. Continue IV fluconazole .  Add nystatin  swish and swallow as well.   Lupus with chronic immunosuppression  Methotrexate at home.  Will hold for now.   Anemia of chronic disease    Latest hemoglobin of 12.2.  H/o CVA Supportive care at this time.  No acute issues.   Essential hypertension  Hyperlipidemia  Continue home medications including Norvasc , Hyzaar, Crestor  continue to monitor blood pressure.  DVT prophylaxis: SCDs Start: 06/30/23  2137   Disposition: Home likely in 1 to 2 days  Status is: Inpatient Remains inpatient appropriate because: Pending clinical improvement, IV antibiotic, IV antifungals,    Code Status:     Code Status: Full Code  Family Communication: None at bedside  Consultants: PCCM  Procedures: None  Anti-infectives:  Unasyn  and linezolid  Anti-infectives (From admission, onward)    Start     Dose/Rate Route Frequency Ordered Stop   07/02/23 1400  Ampicillin -Sulbactam (UNASYN ) 3 g in sodium chloride  0.9 % 100 mL IVPB        3 g 200 mL/hr over 30 Minutes Intravenous Every 6 hours 07/02/23 1109     07/02/23 1200  linezolid (ZYVOX) IVPB 600 mg        600 mg 300 mL/hr over 60 Minutes Intravenous Every 12 hours 07/02/23 1110     07/01/23 1200  fluconazole  (DIFLUCAN ) IVPB 200 mg        200 mg 100 mL/hr over 60 Minutes Intravenous Every 24 hours 07/01/23 1101 07/08/23 1159   07/01/23 0000  Ampicillin -Sulbactam (UNASYN ) 3 g in sodium chloride  0.9 % 100 mL IVPB  Status:  Discontinued        3 g 200 mL/hr over 30 Minutes Intravenous Every 8 hours 06/30/23 2155 07/02/23 1109   06/30/23 1645  ceFEPIme  (MAXIPIME ) 2 g in sodium chloride  0.9 % 100 mL IVPB        2 g 200 mL/hr over 30 Minutes Intravenous  Once 06/30/23 1630 06/30/23 1837   06/30/23 1645  metroNIDAZOLE  (FLAGYL ) IVPB 500 mg        500 mg 100  mL/hr over 60 Minutes Intravenous  Once 06/30/23 1630 06/30/23 2150   06/30/23 1645  vancomycin  (VANCOCIN ) IVPB 1000 mg/200 mL premix        1,000 mg 200 mL/hr over 60 Minutes Intravenous  Once 06/30/23 1630 06/30/23 1839        Subjective: Today, patient was seen and examined at bedside.  Complains of some sore throat and discomfort and difficulty swallowing but no dyspnea stridor or voice changes.  No shortness of breath.  Objective: Vitals:   07/02/23 0833 07/02/23 0900  BP:  (!) 146/72  Pulse:  (!) 109  Resp:    Temp:    SpO2: 97% 100%    Intake/Output Summary (Last 24  hours) at 07/02/2023 1135 Last data filed at 07/02/2023 0200 Gross per 24 hour  Intake 919.97 ml  Output --  Net 919.97 ml   Filed Weights   06/30/23 2137 06/30/23 2317  Weight: 77.2 kg 77.2 kg   Body mass index is 30.15 kg/m.   Physical Exam: GENERAL: Patient is alert awake and oriented. Not in obvious distress. HENT: No scleral pallor or icterus. Pupils equally reactive to light. Oral mucosa is moist, alopecia noted.  Extensive oral thrush noted NECK: is supple, no gross swelling noted. CHEST: Decreased breath sounds bilaterally. CVS: S1 and S2 heard, no murmur. Regular rate and rhythm.  ABDOMEN: Soft, non-tender, bowel sounds are present. EXTREMITIES: No edema. CNS: Cranial nerves are intact. No focal motor deficits. SKIN: warm and dry, baldness  Data Review: I have personally reviewed the following laboratory data and studies,  CBC: Recent Labs  Lab 06/30/23 1543 07/01/23 0321  WBC 1.8* 3.5*  NEUTROABS 1.2*  --   HGB 12.7 12.2  HCT 39.7 38.1  MCV 94.3 94.1  PLT 152 113*   Basic Metabolic Panel: Recent Labs  Lab 06/30/23 1543 07/01/23 0321  NA 138 139  K 3.8 3.7  CL 105 107  CO2 23 20*  GLUCOSE 107* 184*  BUN 12 10  CREATININE 1.04* 0.82  CALCIUM  9.3 8.8*  MG  --  1.8   Liver Function Tests: Recent Labs  Lab 06/30/23 1543  AST 40  ALT 26  ALKPHOS 102  BILITOT 1.9*  PROT 7.2  ALBUMIN 4.3   No results for input(s): LIPASE, AMYLASE in the last 168 hours. No results for input(s): AMMONIA in the last 168 hours. Cardiac Enzymes: No results for input(s): CKTOTAL, CKMB, CKMBINDEX, TROPONINI in the last 168 hours. BNP (last 3 results) Recent Labs    06/03/23 1620  BNP 24.2    ProBNP (last 3 results) No results for input(s): PROBNP in the last 8760 hours.  CBG: No results for input(s): GLUCAP in the last 168 hours. Recent Results (from the past 240 hours)  Culture, blood (Routine x 2)     Status: None (Preliminary result)    Collection Time: 06/30/23  4:34 PM   Specimen: BLOOD RIGHT ARM  Result Value Ref Range Status   Specimen Description   Final    BLOOD RIGHT ARM Performed at [facility], [address], [place], [place] [address]    Special Requests   Final    BOTTLES DRAWN AEROBIC AND ANAEROBIC Blood Culture results may not be optimal due to an inadequate volume of blood received in culture bottles Performed at [facility], [address], [place], [place] [address]    Culture   Final    NO GROWTH 2 DAYS Performed at [facility], [address] [place], [place] [address]    Report Status PENDING  Incomplete  Culture, blood (Routine x 2)     Status: None (Preliminary result)   Collection Time: 06/30/23  4:38 PM   Specimen: Right Antecubital; Blood  Result Value Ref Range Status   Specimen Description   Final    RIGHT ANTECUBITAL Performed at [facility], [address], [place], [place] [address]    Special Requests   Final    BOTTLES DRAWN AEROBIC AND ANAEROBIC Blood Culture results may not be optimal due to an inadequate volume of blood received in culture bottles Performed at [facility], [address] [place], [place] [address]    Culture   Final    NO GROWTH 2 DAYS Performed at [facility], [address] [place], [place] [address]    Report Status PENDING  Incomplete  Resp panel by RT-PCR (RSV, Flu A&B, Covid) Throat     Status: None   Collection Time: 06/30/23  5:04 PM   Specimen: Throat; Nasal Swab  Result Value Ref Range Status   SARS Coronavirus 2 by RT PCR NEGATIVE NEGATIVE Final    Comment: (NOTE) SARS-CoV-2 target nucleic acids are NOT DETECTED.  The SARS-CoV-2 RNA is generally detectable in upper respiratory specimens during the acute phase of infection. The lowest concentration of SARS-CoV-2 viral copies this assay can detect is 138 copies/mL. A negative result does not preclude  SARS-Cov-2 infection and should not be used as the sole basis for treatment or other patient management decisions. A negative result may occur with  improper specimen collection/handling, submission of specimen other than nasopharyngeal swab, presence of viral mutation(s) within the areas targeted by this assay, and inadequate number of viral copies(<138 copies/mL). A negative result must be combined with clinical observations, patient history, and epidemiological information. The expected result is Negative.  Fact Sheet for Patients:  BloggerCourse.com  Fact Sheet for Healthcare Providers:  SeriousBroker.it  This test is no t yet approved or cleared by the [place]  FDA and  has been authorized for detection and/or diagnosis of SARS-CoV-2 by FDA under an Emergency Use Authorization (EUA). This EUA will remain  in effect (meaning this test can be used) for the duration of the COVID-19 declaration under Section 564(b)(1) of the Act, 21 U.S.C.section 360bbb-3(b)(1), unless the authorization is terminated  or revoked sooner.       Influenza A by PCR NEGATIVE NEGATIVE Final   Influenza B by PCR NEGATIVE NEGATIVE Final    Comment: (NOTE) The Xpert Xpress SARS-CoV-2/FLU/RSV plus assay is intended as an aid in the diagnosis of influenza from Nasopharyngeal swab specimens and should not be used as a sole basis for treatment. Nasal washings and aspirates are unacceptable for Xpert Xpress SARS-CoV-2/FLU/RSV testing.  Fact Sheet for Patients: BloggerCourse.com  Fact Sheet for Healthcare Providers: SeriousBroker.it  This test is not yet approved or cleared by the [place]  FDA and has been authorized for detection and/or diagnosis of SARS-CoV-2 by FDA under an Emergency Use Authorization (EUA). This EUA will remain in effect (meaning this test can be used) for the duration of  the COVID-19 declaration under Section 564(b)(1) of the Act, 21 U.S.C. section 360bbb-3(b)(1), unless the authorization is terminated or revoked.     Resp Syncytial Virus by PCR NEGATIVE NEGATIVE Final    Comment: (NOTE) Fact Sheet for Patients: BloggerCourse.com  Fact Sheet for Healthcare Providers: SeriousBroker.it  This test is not yet approved  or cleared by the [place]  FDA and has been authorized for detection and/or diagnosis of SARS-CoV-2 by FDA under an Emergency Use Authorization (EUA). This EUA will remain in effect (meaning this test can be used) for the duration of the COVID-19 declaration under Section 564(b)(1) of the Act, 21 U.S.C. section 360bbb-3(b)(1), unless the authorization is terminated or revoked.  Performed at [facility], [address], [place], [place] [address]   Group A Strep by PCR     Status: None   Collection Time: 06/30/23  5:04 PM   Specimen: Throat; Sterile Swab  Result Value Ref Range Status   Group A Strep by PCR NOT DETECTED NOT DETECTED Final    Comment: Performed at [facility], [address] [place], [place] [address]  MRSA Next Gen by PCR, Nasal     Status: None   Collection Time: 06/30/23  9:56 PM   Specimen: Nasal Mucosa; Nasal Swab  Result Value Ref Range Status   MRSA by PCR Next Gen NOT DETECTED NOT DETECTED Final    Comment: (NOTE) The GeneXpert MRSA Assay (FDA approved for NASAL specimens only), is one component of a comprehensive MRSA colonization surveillance program. It is not intended to diagnose MRSA infection nor to guide or monitor treatment for MRSA infections. Test performance is not FDA approved in patients less than 64 years old. Performed at [facility], [address] [place], [place] [address]   Respiratory (~20 pathogens) panel by PCR     Status: None   Collection Time: 06/30/23  9:56 PM    Specimen: Nasal Mucosa; Respiratory  Result Value Ref Range Status   Adenovirus NOT DETECTED NOT DETECTED Final   Coronavirus 229E NOT DETECTED NOT DETECTED Final    Comment: (NOTE) The Coronavirus on the Respiratory Panel, DOES NOT test for the novel  Coronavirus (2019 nCoV)    Coronavirus HKU1 NOT DETECTED NOT DETECTED Final   Coronavirus NL63 NOT DETECTED NOT DETECTED Final   Coronavirus OC43 NOT DETECTED NOT DETECTED Final   Metapneumovirus NOT DETECTED NOT DETECTED Final   Rhinovirus / Enterovirus NOT DETECTED NOT DETECTED Final   Influenza A NOT DETECTED NOT DETECTED Final   Influenza B NOT DETECTED NOT DETECTED Final   Parainfluenza Virus 1 NOT DETECTED NOT DETECTED Final   Parainfluenza Virus 2 NOT DETECTED NOT DETECTED Final   Parainfluenza Virus 3 NOT DETECTED NOT DETECTED Final   Parainfluenza Virus 4 NOT DETECTED NOT DETECTED Final   Respiratory Syncytial Virus NOT DETECTED NOT DETECTED Final   Bordetella pertussis NOT DETECTED NOT DETECTED Final   Bordetella Parapertussis NOT DETECTED NOT DETECTED Final   Chlamydophila pneumoniae NOT DETECTED NOT DETECTED Final   Mycoplasma pneumoniae NOT DETECTED NOT DETECTED Final    Comment: Performed at [facility], [address] [place], [place] [address]     Studies: CT Soft Tissue Neck W Contrast Result Date: 06/30/2023 CLINICAL DATA:  Initial evaluation for acute sore throat, soft tissue infection suspected. EXAM: CT NECK WITH CONTRAST TECHNIQUE: Multidetector CT imaging of the neck was performed using the standard protocol following the bolus administration of intravenous contrast. RADIATION DOSE REDUCTION: This exam was performed according to the departmental dose-optimization program which includes automated exposure control, adjustment of the mA and/or kV according to patient size and/or use of iterative reconstruction technique. CONTRAST:  100mL OMNIPAQUE  IOHEXOL  300 MG/ML  SOLN COMPARISON:  None available. FINDINGS:  Pharynx and larynx: Oral cavity within normal limits.  Mucosal edema present throughout the or pharyngeal mucosa. Epiglottis is mildly thickened and edematous in appearance as are the aryepiglottic folds. Findings suggestive of acute supraglottitis. Supraglottic airway remains patent at this time. Trace layering secretions noted within the hypopharynx. No retropharyngeal collection. Glottis within normal limits. Subglottic airway patent clear. No discrete abscess or drainable fluid collection. Salivary glands: Salivary glands including the parotid and submandibular glands are within normal limits. Thyroid : Prior right hemithyroidectomy. Left lobe of thyroid  normal. Lymph nodes: No enlarged or pathologic lymph nodes within the neck. Vascular: Normal intravascular enhancement seen within the neck. Mild aortic atherosclerosis. Limited intracranial: Probable small remote left cerebellar infarct. Otherwise unremarkable. Visualized orbits: Scleral buckle in place at the right globe. Globes orbital soft tissues otherwise unremarkable. Mastoids and visualized paranasal sinuses: Visualized paranasal sinuses are largely clear. Mastoid air cells and middle ear cavities are well pneumatized and free of fluid. Skeleton: No worrisome osseous lesions. Mild to moderate cervical spondylosis, most pronounced at C4-5. Patient is edentulous. Upper chest: No other acute finding. Linear atelectasis and/or scarring present at the posterior right upper lobe. Other: None. IMPRESSION: 1. Findings suggestive of acute epiglottitis/supraglottitis as above. Supraglottic airway remains patent at this time. No discrete abscess or drainable fluid collection. 2. No other acute abnormality within the neck. 3.  Aortic Atherosclerosis (ICD10-I70.0). Electronically Signed   By: Morene Hoard M.D.   On: 06/30/2023 19:21   CT CHEST ABDOMEN PELVIS W CONTRAST Result Date: 06/30/2023 CLINICAL DATA:  Sepsis PT reports sore throat, abdominal pain and  chills. Pt does undergo immunotherapy for lupus. Pt has a fever of 102.9 during triage. EXAM: CT CHEST, ABDOMEN, AND PELVIS WITH CONTRAST TECHNIQUE: Multidetector CT imaging of the chest, abdomen and pelvis was performed following the standard protocol during bolus administration of intravenous contrast. RADIATION DOSE REDUCTION: This exam was performed according to the departmental dose-optimization program which includes automated exposure control, adjustment of the mA and/or kV according to patient size and/or use of iterative reconstruction technique. CONTRAST:  OMNIPAQUE  IOHEXOL  300 MG/ML  SOLN COMPARISON:  None Available. FINDINGS: CT CHEST FINDINGS Cardiovascular: Normal heart size. No significant pericardial effusion. The thoracic aorta is normal in caliber. Mild-to-moderate atherosclerotic plaque of the thoracic aorta. No coronary artery calcifications. Mediastinum/Nodes: No enlarged mediastinal, hilar, or axillary lymph nodes. Thyroid  gland, trachea, and esophagus demonstrate no significant findings. Partial right thyroidectomy. Tiny hiatal hernia. Lungs/Pleura: No focal consolidation. No pulmonary nodule. No pulmonary mass. No pleural effusion. No pneumothorax. Musculoskeletal: No chest wall abnormality. Non-wireless left chest wall cardiac device. No suspicious lytic or blastic osseous lesions. No acute displaced fracture. Multilevel mild degenerative change of the spine. CT ABDOMEN PELVIS FINDINGS Hepatobiliary: No focal liver abnormality. No gallstones, gallbladder wall thickening, or pericholecystic fluid. No biliary dilatation. Pancreas: No focal lesion. Normal pancreatic contour. No surrounding inflammatory changes. No main pancreatic ductal dilatation. Spleen: Normal in size without focal abnormality. Adrenals/Urinary Tract: No adrenal nodule bilaterally. Bilateral kidneys enhance symmetrically. No hydronephrosis. No hydroureter. 4 mm right nephrolithiasis. No left nephrolithiasis. Bilateral  parapelvic simple cysts - simple renal cysts, in the absence of clinically indicated signs/symptoms, require no independent follow-up. The urinary bladder is unremarkable. Stomach/Bowel: Stomach is within normal limits. No evidence of bowel wall thickening or dilatation. Appendix appears normal. Vascular/Lymphatic: No abdominal aorta or iliac aneurysm. No abdominal, pelvic, or inguinal lymphadenopathy. Reproductive: Not well visualized due to streak artifact originating from the left femoral surgical hardware. Other: No intraperitoneal free fluid. No intraperitoneal free gas. No organized fluid collection.  Musculoskeletal: No abdominal wall hernia or abnormality. No suspicious lytic or blastic osseous lesions. No acute displaced fracture. L3 vertebral body hemangioma. IMPRESSION: No acute intrathoracic, intra-abdominal, or intrapelvic abnormality. Electronically Signed   By: Morgane  Naveau M.D.   On: 06/30/2023 19:12   DG Chest Port 1 View Result Date: 06/30/2023 CLINICAL DATA:  [id number] Sepsis [facility]) [id number] EXAM: PORTABLE CHEST - 1 VIEW COMPARISON:  Jun 03, 2023 FINDINGS: Prominence of the right paratracheal stripe and right hilar region. Lower lung volumes. No focal airspace consolidation, pleural effusion, or pneumothorax. No cardiomegaly. No acute fracture or destructive lesion. IMPRESSION: Lower lung volumes. Prominence of the right lower paratracheal stripe and right hilar region, which may be artifactual due to low lung volumes and patient rotation. Alternatively, perihilar airspace disease or developing lymphadenopathy could have this appearance. Nonemergent chest CT should be considered for further characterization. Electronically Signed   By: Rogelia Myers M.D.   On: 06/30/2023 16:32      Vernal Alstrom, MD  [facility] 07/02/2023  If 7PM-7AM, please contact night-coverage

## 2023-07-02 NOTE — Consult Note (Signed)
 ENT CONSULT:  Reason for Consult: Pharyngitis  Referring Physician: Hospitalist service  HPI: Elaine Allen is an 68 y.o. female with lupus on immunosuppression who 2 to 3 days ago started to develop a severe sore throat.  This worsened to the point that she was then able to talk and eat or drink.  He was initially admitted to the ICU with clinical sepsis and CT findings of supraglottic and epiglottic swelling concerning for epiglottitis.  She was started on IV steroids, antibiotics and antifungal therapy.  She reports that since yesterday she is slightly improved but is still unable to eat or drink well.   Past Medical History:  Diagnosis Date   Arthritis    Blind right eye    sees colors   Chronic anemia    Detached retina, right    corrected good for about 6 months sees peripheral colors only   History of kidney stones    Hypertension    Hyperthyroidism    Lupus    Pneumonia    Retinal detachment    Spasmodic dysphonia    Stroke (HCC) 2015   no permanant defecits had weakness in left arm in hospital Had PT also   Vertigo    Hx of preceding stroke    Past Surgical History:  Procedure Laterality Date   ABDOMINAL HYSTERECTOMY     BREAST EXCISIONAL BIOPSY Left    KNEE SURGERY Left    THYROID  SURGERY     TOTAL HIP ARTHROPLASTY Left 06/27/2018   Procedure: TOTAL HIP ARTHROPLASTY ANTERIOR APPROACH;  Surgeon: Liam Lerner, MD;  Location: [facility];  Service: Orthopedics;  Laterality: Left;    Family History  Problem Relation Age of Onset   Cancer Mother        liver   Diabetes Mother    Arthritis Mother    Cancer Father        prostate,METS, stomach    Ovarian cancer Sister    Throat cancer Brother     Social History:  reports that she has never smoked. She has never been exposed to tobacco smoke. She has never used smokeless tobacco. She reports that she does not drink alcohol and does not use drugs.  Allergies:  Allergies  Allergen Reactions   Plaquenil  [Hydroxychloroquine Sulfate] Anaphylaxis and Other (See Comments)    Blisters all over body   Amoxicillin  Other (See Comments)   Azithromycin  Nausea And Vomiting   Lactose Intolerance (Gi) Other (See Comments)    Upset stomach   Penicillin G Itching and Nausea Only   Hydroxychloroquine Sulfate Other (See Comments) and Rash    Medications: I have reviewed the patient's current medications.  Results for orders placed or performed during the hospital encounter of 06/30/23 (from the past 48 hours)  MRSA Next Gen by PCR, Nasal     Status: None   Collection Time: 06/30/23  9:56 PM   Specimen: Nasal Mucosa; Nasal Swab  Result Value Ref Range   MRSA by PCR Next Gen NOT DETECTED NOT DETECTED    Comment: (NOTE) The GeneXpert MRSA Assay (FDA approved for NASAL specimens only), is one component of a comprehensive MRSA colonization surveillance program. It is not intended to diagnose MRSA infection nor to guide or monitor treatment for MRSA infections. Test performance is not FDA approved in patients less than 90 years old. Performed at [facility], [address] [place], [place] [address]   Respiratory (~20 pathogens) panel by PCR     Status:  None   Collection Time: 06/30/23  9:56 PM   Specimen: Nasal Mucosa; Respiratory  Result Value Ref Range   Adenovirus NOT DETECTED NOT DETECTED   Coronavirus 229E NOT DETECTED NOT DETECTED    Comment: (NOTE) The Coronavirus on the Respiratory Panel, DOES NOT test for the novel  Coronavirus (2019 nCoV)    Coronavirus HKU1 NOT DETECTED NOT DETECTED   Coronavirus NL63 NOT DETECTED NOT DETECTED   Coronavirus OC43 NOT DETECTED NOT DETECTED   Metapneumovirus NOT DETECTED NOT DETECTED   Rhinovirus / Enterovirus NOT DETECTED NOT DETECTED   Influenza A NOT DETECTED NOT DETECTED   Influenza B NOT DETECTED NOT DETECTED   Parainfluenza Virus 1 NOT DETECTED NOT DETECTED   Parainfluenza Virus 2 NOT DETECTED NOT DETECTED   Parainfluenza  Virus 3 NOT DETECTED NOT DETECTED   Parainfluenza Virus 4 NOT DETECTED NOT DETECTED   Respiratory Syncytial Virus NOT DETECTED NOT DETECTED   Bordetella pertussis NOT DETECTED NOT DETECTED   Bordetella Parapertussis NOT DETECTED NOT DETECTED   Chlamydophila pneumoniae NOT DETECTED NOT DETECTED   Mycoplasma pneumoniae NOT DETECTED NOT DETECTED    Comment: Performed at [facility], [address] [place], [place] [address]  Type and screen If need to transfuse blood products please use the blood administration order set     Status: None   Collection Time: 06/30/23  9:56 PM  Result Value Ref Range   ABO/RH(D) O POS    Antibody Screen NEG    Sample Expiration      07/03/2023,2359 Performed at [facility], [address] [place], [place] [address]   CBC     Status: Abnormal   Collection Time: 07/01/23  3:21 AM  Result Value Ref Range   WBC 3.5 (L) 4.0 - 10.5 K/uL   RBC 4.05 3.87 - 5.11 MIL/uL   Hemoglobin 12.2 12.0 - 15.0 g/dL   HCT 61.8 63.9 - 53.9 %   MCV 94.1 80.0 - 100.0 fL   MCH 30.1 26.0 - 34.0 pg   MCHC 32.0 30.0 - 36.0 g/dL   RDW 84.8 88.4 - 84.4 %   Platelets 113 (L) 150 - 400 K/uL   nRBC 0.0 0.0 - 0.2 %    Comment: Performed at [facility], [address] [place], [place] [address]  Basic metabolic panel     Status: Abnormal   Collection Time: 07/01/23  3:21 AM  Result Value Ref Range   Sodium 139 135 - 145 mmol/L   Potassium 3.7 3.5 - 5.1 mmol/L   Chloride 107 98 - 111 mmol/L   CO2 20 (L) 22 - 32 mmol/L   Glucose, Bld 184 (H) 70 - 99 mg/dL    Comment: Glucose reference range applies only to samples taken after fasting for at least 8 hours.   BUN 10 8 - 23 mg/dL   Creatinine, Ser 9.17 0.44 - 1.00 mg/dL   Calcium  8.8 (L) 8.9 - 10.3 mg/dL   GFR, Estimated >39 >39 mL/min    Comment: (NOTE) Calculated using the CKD-EPI Creatinine Equation (2021)    Anion gap 12 5 - 15    Comment: Performed at [facility], [address], [place], [place] [address]  Magnesium     Status: None   Collection Time: 07/01/23  3:21 AM  Result Value Ref Range   Magnesium 1.8 1.7 - 2.4 mg/dL    Comment: Performed at [facility], [address]  [place], [place] [address]    No results found.  MND:wzhjupcz other than stated per HPI  Blood pressure 136/71, pulse 95, temperature 98.4 F (36.9 C), temperature source Oral, resp. rate 18, height 5' 3 (1.6 m), weight 77.2 kg, SpO2 98%.  PHYSICAL EXAM:  CONSTITUTIONAL: well developed, nourished, no distress and alert and oriented x 3 EYES: PERRL, EOMI  HENT: Head : normocephalic and atraumatic Ears: Right ear:   canal normal, external ear normal and hearing normal Left ear:   canal normal, external ear normal and hearing normal Nose: nose normal and no purulence Mouth/Throat:  Mouth: Thick white debris on tongue  Throat: oropharynx with diffuse erythema and shallow ulcerations. NECK: supple, trachea normal and no thyromegaly or cervical LAD NEURO: CN II-XII symmetric intact   Flexible laryngoscopy: The flexible endoscope was passed through the nasal cavity which was uninvolved with pathology.  The oropharynx and larynx were visualized.  There was minimal epiglottic swelling and no evidence of bacterial infection of the epiglottis or supraglottic larynx.  There was some mild diffuse shallow ulcerations throughout the pharynx and piriform sinuses.  The airway was widely patent.  Assessment/Plan: Acute pharyngitis - I discussed with the patient potential etiologies of her very severe and acute onset pharyngitis.  Given her immunosuppression severe viral pharyngitis is a possibility.  Secondary fungal pharyngitis or esophagitis is also not uncommon in the setting and I agree with systemic antifungals at this time.  Agree with continuing antibiotics but can likely start to taper steroids given no significant airway concerns at this  time.  Epiglottitis - No concerning findings on bedside laryngoscopy for acute bacterial epiglottitis or airway obstruction.   Zach Samarth Ogle MD [facility] ENT  07/02/2023, 7:50 PM

## 2023-07-03 DIAGNOSIS — J0510 Acute epiglottitis without obstruction: Secondary | ICD-10-CM | POA: Diagnosis not present

## 2023-07-03 LAB — CBC
HCT: 36.6 % (ref 36.0–46.0)
Hemoglobin: 11.5 g/dL — ABNORMAL LOW (ref 12.0–15.0)
MCH: 30.2 pg (ref 26.0–34.0)
MCHC: 31.4 g/dL (ref 30.0–36.0)
MCV: 96.1 fL (ref 80.0–100.0)
Platelets: 96 10*3/uL — ABNORMAL LOW (ref 150–400)
RBC: 3.81 MIL/uL — ABNORMAL LOW (ref 3.87–5.11)
RDW: 15.3 % (ref 11.5–15.5)
WBC: 1.3 10*3/uL — CL (ref 4.0–10.5)
nRBC: 0 % (ref 0.0–0.2)

## 2023-07-03 LAB — BASIC METABOLIC PANEL WITH GFR
Anion gap: 12 (ref 5–15)
BUN: 27 mg/dL — ABNORMAL HIGH (ref 8–23)
CO2: 21 mmol/L — ABNORMAL LOW (ref 22–32)
Calcium: 8.9 mg/dL (ref 8.9–10.3)
Chloride: 107 mmol/L (ref 98–111)
Creatinine, Ser: 0.84 mg/dL (ref 0.44–1.00)
GFR, Estimated: >60 mL/min (ref >60)
Glucose, Bld: 150 mg/dL — ABNORMAL HIGH (ref 70–99)
Potassium: 3.7 mmol/L (ref 3.5–5.1)
Sodium: 140 mmol/L (ref 135–145)

## 2023-07-03 LAB — MAGNESIUM: Magnesium: 2.5 mg/dL — ABNORMAL HIGH (ref 1.7–2.4)

## 2023-07-03 MED ORDER — SACCHAROMYCES BOULARDII 250 MG PO CAPS
250.0000 mg | ORAL_CAPSULE | Freq: Two times a day (BID) | ORAL | Status: DC
  Administered 2023-07-03 – 2023-07-05 (×5): 250 mg via ORAL
  Filled 2023-07-03 (×5): qty 1

## 2023-07-03 MED ORDER — SODIUM CHLORIDE 0.9 % IV SOLN: INTRAVENOUS | Status: AC

## 2023-07-03 MED ORDER — DEXAMETHASONE SODIUM PHOSPHATE 4 MG/ML IJ SOLN
4.0000 mg | Freq: Three times a day (TID) | INTRAMUSCULAR | Status: DC
  Administered 2023-07-03 – 2023-07-04 (×3): 4 mg via INTRAVENOUS
  Filled 2023-07-03 (×3): qty 1

## 2023-07-03 NOTE — Progress Notes (Addendum)
 PROGRESS NOTE  Elaine Allen FMW:[id number] DOB: 17-Apr-1955 DOA: 06/30/2023 PCP: [name], NP   LOS: 3 days   Brief narrative:  Patient is a 68 yo female with history of lupus on multiple immunosuppressants and steroids presented to the hospital with progressive sore throat fever.  History of admission a month back for fevers.  At this time, patient was unable to eat or drink and had difficulty talking due to throat pain.  ENT was contacted by ED provider and patient was admitted to hospital for steroids and antibiotics.  Patient was initially admitted to the ICU service and was considered be stable for transfer out of the ICU.   Assessment/Plan: Principal Problem:   Epiglottitis  Acute epiglottis Pharyngitis, acute Respiratory viral panel negative.  CT scanning of the soft tissue of the neck on admit with acute epiglottitis/supraglottitis, supraglottic airway remains patent with no discrete abscess or drainable fluid collection.  On IV dexamethasone .  Will decrease the dose from today.  Continue aspiration precautions.  On empiric Unasyn  and  linezolid as well.  Blood cultures negative in 2 days.  Patient states slight improvement in swallowing today.  Seen by ENT and no concerns for infection.  Oral thrush extensive, possibility of candidal esophagitis. Continue IV fluconazole .  Continue nystatin  swish and swallow as well.   Lupus with chronic immunosuppression  Methotrexate at home.  Will hold for now.   Anemia of chronic disease    Latest hemoglobin of 11.5 from 12.2  Mild leukopenia noted today.  Mild thrombocytopenia as well.  Has thrombocytopenia as well.  Will monitor if continues to go down we will need to de-escalate antibiotic.  H/o CVA Supportive care at this time.  No acute issues.   Essential hypertension  Hyperlipidemia  Continue home medications including Norvasc , Hyzaar, Crestor  continue to monitor blood pressure.  DVT prophylaxis: SCDs Start: 06/30/23  2137   Addendum:  07/03/2023 2:34 PM   Nursing staff reported syncopal episode and weakness.  Orthostatic tachycardia noted.  No chest pain.  EKG normal.  Continue telemetry monitoring.  Will get 2D echocardiogram.  Start on normal saline hydration.  Will need continued physical therapy evaluation had loose stool.  Likely antibiotic use.  Diarrhea.  Hold MiraLAX .  Communicated with the nurse about it.  Disposition: Home likely in 1 to 2 days  Status is: Inpatient Remains inpatient appropriate because: Pending clinical improvement, IV antibiotic, IV antifungals,   Code Status:     Code Status: Full Code  Family Communication: None at bedside  Consultants: PCCM  Procedures: None  Anti-infectives:  Unasyn  and linezolid Fluconazole   Anti-infectives (From admission, onward)    Start     Dose/Rate Route Frequency Ordered Stop   07/02/23 1400  Ampicillin -Sulbactam (UNASYN ) 3 g in sodium chloride  0.9 % 100 mL IVPB        3 g 200 mL/hr over 30 Minutes Intravenous Every 6 hours 07/02/23 1109     07/02/23 1200  linezolid (ZYVOX) IVPB 600 mg        600 mg 300 mL/hr over 60 Minutes Intravenous Every 12 hours 07/02/23 1110     07/01/23 1200  fluconazole  (DIFLUCAN ) IVPB 200 mg        200 mg 100 mL/hr over 60 Minutes Intravenous Every 24 hours 07/01/23 1101 07/08/23 1159   07/01/23 0000  Ampicillin -Sulbactam (UNASYN ) 3 g in sodium chloride  0.9 % 100 mL IVPB  Status:  Discontinued        3 g 200 mL/hr over  30 Minutes Intravenous Every 8 hours 06/30/23 2155 07/02/23 1109   06/30/23 1645  ceFEPIme  (MAXIPIME ) 2 g in sodium chloride  0.9 % 100 mL IVPB        2 g 200 mL/hr over 30 Minutes Intravenous  Once 06/30/23 1630 06/30/23 1837   06/30/23 1645  metroNIDAZOLE  (FLAGYL ) IVPB 500 mg        500 mg 100 mL/hr over 60 Minutes Intravenous  Once 06/30/23 1630 06/30/23 2150   06/30/23 1645  vancomycin  (VANCOCIN ) IVPB 1000 mg/200 mL premix        1,000 mg 200 mL/hr over 60 Minutes Intravenous   Once 06/30/23 1630 06/30/23 1839      Subjective: Today, patient was seen and examined at bedside.  Complains of feeling little better with throat today with less pain and was able to eat better.  Denies any nausea vomiting shortness of breath or dyspnea.   Objective: Vitals:   07/03/23 0521 07/03/23 0904  BP: (!) 131/91 127/69  Pulse: 94 (!) 106  Resp: 18   Temp: 98 F (36.7 C) 98.4 F (36.9 C)  SpO2: 96% 99%    Intake/Output Summary (Last 24 hours) at 07/03/2023 1109 Last data filed at 07/03/2023 0524 Gross per 24 hour  Intake 120 ml  Output --  Net 120 ml   Filed Weights   06/30/23 2137 06/30/23 2317 07/03/23 0524  Weight: 77.2 kg 77.2 kg 79.4 kg   Body mass index is 31.01 kg/m.   Physical Exam:  GENERAL: Patient is alert awake and oriented. Not in obvious distress.  Obese. HENT: No scleral pallor or icterus. Pupils equally reactive to light. Oral mucosa is moist, alopecia noted.  Whitish discoloration of the oral mucosa.   NECK: is supple, no gross swelling noted. CHEST: Decreased breath sounds bilaterally. CVS: S1 and S2 heard, no murmur. Regular rate and rhythm.  ABDOMEN: Soft, non-tender, bowel sounds are present. EXTREMITIES: No edema. CNS: Cranial nerves are intact. No focal motor deficits. SKIN: warm and dry, baldness  Data Review: I have personally reviewed the following laboratory data and studies,  CBC: Recent Labs  Lab 06/30/23 1543 07/01/23 0321 07/03/23 0741  WBC 1.8* 3.5* 1.3*  NEUTROABS 1.2*  --   --   HGB 12.7 12.2 11.5*  HCT 39.7 38.1 36.6  MCV 94.3 94.1 96.1  PLT 152 113* 96*   Basic Metabolic Panel: Recent Labs  Lab 06/30/23 1543 07/01/23 0321 07/03/23 0741  NA 138 139 140  K 3.8 3.7 3.7  CL 105 107 107  CO2 23 20* 21*  GLUCOSE 107* 184* 150*  BUN 12 10 27*  CREATININE 1.04* 0.82 0.84  CALCIUM  9.3 8.8* 8.9  MG  --  1.8 2.5*   Liver Function Tests: Recent Labs  Lab 06/30/23 1543  AST 40  ALT 26  ALKPHOS 102  BILITOT  1.9*  PROT 7.2  ALBUMIN 4.3   No results for input(s): LIPASE, AMYLASE in the last 168 hours. No results for input(s): AMMONIA in the last 168 hours. Cardiac Enzymes: No results for input(s): CKTOTAL, CKMB, CKMBINDEX, TROPONINI in the last 168 hours. BNP (last 3 results) Recent Labs    06/03/23 1620  BNP 24.2    ProBNP (last 3 results) No results for input(s): PROBNP in the last 8760 hours.  CBG: No results for input(s): GLUCAP in the last 168 hours. Recent Results (from the past 240 hours)  Culture, blood (Routine x 2)     Status: None (Preliminary result)  Collection Time: 06/30/23  4:34 PM   Specimen: BLOOD RIGHT ARM  Result Value Ref Range Status   Specimen Description   Final    BLOOD RIGHT ARM Performed at [facility], [address] [place], [place] [address]    Special Requests   Final    BOTTLES DRAWN AEROBIC AND ANAEROBIC Blood Culture results may not be optimal due to an inadequate volume of blood received in culture bottles Performed at [facility], [address] [place], [place] [address]    Culture   Final    NO GROWTH 3 DAYS Performed at [facility], [address] [place], [place] [address]    Report Status PENDING  Incomplete  Culture, blood (Routine x 2)     Status: None (Preliminary result)   Collection Time: 06/30/23  4:38 PM   Specimen: Right Antecubital; Blood  Result Value Ref Range Status   Specimen Description   Final    RIGHT ANTECUBITAL Performed at [facility], [address], [place], [place] [address]    Special Requests   Final    BOTTLES DRAWN AEROBIC AND ANAEROBIC Blood Culture results may not be optimal due to an inadequate volume of blood received in culture bottles Performed at [facility], [address], [place], [place] [address]    Culture   Final    NO GROWTH 3 DAYS Performed at [facility], [address] [place], [place] [address]    Report Status PENDING  Incomplete  Resp panel by RT-PCR (RSV, Flu A&B, Covid) Throat     Status: None   Collection Time: 06/30/23  5:04 PM   Specimen: Throat; Nasal Swab  Result Value Ref Range Status   SARS Coronavirus 2 by RT PCR NEGATIVE NEGATIVE Final    Comment: (NOTE) SARS-CoV-2 target nucleic acids are NOT DETECTED.  The SARS-CoV-2 RNA is generally detectable in upper respiratory specimens during the acute phase of infection. The lowest concentration of SARS-CoV-2 viral copies this assay can detect is 138 copies/mL. A negative result does not preclude SARS-Cov-2 infection and should not be used as the sole basis for treatment or other patient management decisions. A negative result may occur with  improper specimen collection/handling, submission of specimen other than nasopharyngeal swab, presence of viral mutation(s) within the areas targeted by this assay, and inadequate number of viral copies(<138 copies/mL). A negative result must be combined with clinical observations, patient history, and epidemiological information. The expected result is Negative.  Fact Sheet for Patients:  BloggerCourse.com  Fact Sheet for Healthcare Providers:  SeriousBroker.it  This test is no t yet approved or cleared by the [place]  FDA and  has been authorized for detection and/or diagnosis of SARS-CoV-2 by FDA under an Emergency Use Authorization (EUA). This EUA will remain  in effect (meaning this test can be used) for the duration of the COVID-19 declaration under Section 564(b)(1) of the Act, 21 U.S.C.section 360bbb-3(b)(1), unless the authorization is terminated  or revoked sooner.       Influenza A by PCR NEGATIVE NEGATIVE Final   Influenza B by PCR NEGATIVE NEGATIVE Final    Comment: (NOTE) The Xpert Xpress SARS-CoV-2/FLU/RSV plus assay is intended as an aid in the diagnosis of influenza from  Nasopharyngeal swab specimens and should not be used as a sole basis for treatment. Nasal washings and aspirates are unacceptable for Xpert Xpress SARS-CoV-2/FLU/RSV testing.  Fact Sheet for Patients: BloggerCourse.com  Fact Sheet for Healthcare Providers: SeriousBroker.it  This test is not yet approved or cleared by the [place]  FDA and has been authorized for detection and/or diagnosis of SARS-CoV-2 by FDA under an Emergency Use Authorization (EUA). This EUA will remain in effect (meaning this test can be used) for the duration of the COVID-19 declaration under Section 564(b)(1) of the Act, 21 U.S.C. section 360bbb-3(b)(1), unless the authorization is terminated or revoked.     Resp Syncytial Virus by PCR NEGATIVE NEGATIVE Final    Comment: (NOTE) Fact Sheet for Patients: BloggerCourse.com  Fact Sheet for Healthcare Providers: SeriousBroker.it  This test is not yet approved or cleared by the [place]  FDA and has been authorized for detection and/or diagnosis of SARS-CoV-2 by FDA under an Emergency Use Authorization (EUA). This EUA will remain in effect (meaning this test can be used) for the duration of the COVID-19 declaration under Section 564(b)(1) of the Act, 21 U.S.C. section 360bbb-3(b)(1), unless the authorization is terminated or revoked.  Performed at [facility], [address] [place], [place] [address]   Group A Strep by PCR     Status: None   Collection Time: 06/30/23  5:04 PM   Specimen: Throat; Sterile Swab  Result Value Ref Range Status   Group A Strep by PCR NOT DETECTED NOT DETECTED Final    Comment: Performed at [facility], [address] [place], [place] [address]  MRSA Next Gen by PCR, Nasal     Status: None   Collection Time: 06/30/23  9:56 PM   Specimen: Nasal Mucosa; Nasal Swab  Result Value Ref  Range Status   MRSA by PCR Next Gen NOT DETECTED NOT DETECTED Final    Comment: (NOTE) The GeneXpert MRSA Assay (FDA approved for NASAL specimens only), is one component of a comprehensive MRSA colonization surveillance program. It is not intended to diagnose MRSA infection nor to guide or monitor treatment for MRSA infections. Test performance is not FDA approved in patients less than 50 years old. Performed at [facility], [address] [place], [place] [address]   Respiratory (~20 pathogens) panel by PCR     Status: None   Collection Time: 06/30/23  9:56 PM   Specimen: Nasal Mucosa; Respiratory  Result Value Ref Range Status   Adenovirus NOT DETECTED NOT DETECTED Final   Coronavirus 229E NOT DETECTED NOT DETECTED Final    Comment: (NOTE) The Coronavirus on the Respiratory Panel, DOES NOT test for the novel  Coronavirus (2019 nCoV)    Coronavirus HKU1 NOT DETECTED NOT DETECTED Final   Coronavirus NL63 NOT DETECTED NOT DETECTED Final   Coronavirus OC43 NOT DETECTED NOT DETECTED Final   Metapneumovirus NOT DETECTED NOT DETECTED Final   Rhinovirus / Enterovirus NOT DETECTED NOT DETECTED Final   Influenza A NOT DETECTED NOT DETECTED Final   Influenza B NOT DETECTED NOT DETECTED Final   Parainfluenza Virus 1 NOT DETECTED NOT DETECTED Final   Parainfluenza Virus 2 NOT DETECTED NOT DETECTED Final   Parainfluenza Virus 3 NOT DETECTED NOT DETECTED Final   Parainfluenza Virus 4 NOT DETECTED NOT DETECTED Final   Respiratory Syncytial Virus NOT DETECTED NOT DETECTED Final   Bordetella pertussis NOT DETECTED NOT DETECTED Final   Bordetella Parapertussis NOT DETECTED NOT DETECTED Final   Chlamydophila pneumoniae NOT DETECTED NOT DETECTED Final   Mycoplasma pneumoniae NOT DETECTED NOT DETECTED Final    Comment: Performed at [facility], [address], [place], [place]  [address]     Studies: No results found.     Mercia Dowe, MD  [facility] 07/03/2023  If 7PM-7AM, please contact night-coverage

## 2023-07-03 NOTE — Progress Notes (Signed)
 Stable on chart review Seen by ENT CCM will sign off    SIGNATURE    Dr. Dorethia Cave, M.D., F.C.C.P,  Pulmonary and Critical Care Medicine Staff Physician, [facility] Center Director - Interstitial Lung Disease  Program  Pulmonary Fibrosis [facility] at [facility] [place], [place], [address]   Pager: [phone], If no answer  -> Check AMION or Try [phone] Telephone (clinical office): [phone] Telephone (research): [phone]  12:12 PM 07/03/2023

## 2023-07-04 ENCOUNTER — Inpatient Hospital Stay (HOSPITAL_COMMUNITY)

## 2023-07-04 DIAGNOSIS — R55 Syncope and collapse: Secondary | ICD-10-CM

## 2023-07-04 DIAGNOSIS — J0510 Acute epiglottitis without obstruction: Secondary | ICD-10-CM | POA: Diagnosis not present

## 2023-07-04 LAB — ECHOCARDIOGRAM COMPLETE
AR max vel: 2.06 cm2
AV Area VTI: 2.26 cm2
AV Area mean vel: 1.99 cm2
AV Mean grad: 7.7 mmHg
AV Peak grad: 14.4 mmHg
Ao pk vel: 1.9 m/s
Area-P 1/2: 2.99 cm2
Height: 63 in
MV M vel: 5.71 m/s
MV Peak grad: 130.4 mmHg
S' Lateral: 2.3 cm
Weight: 2804.25 [oz_av]

## 2023-07-04 LAB — BASIC METABOLIC PANEL WITH GFR
Anion gap: 9 (ref 5–15)
BUN: 26 mg/dL — ABNORMAL HIGH (ref 8–23)
CO2: 22 mmol/L (ref 22–32)
Calcium: 8.5 mg/dL — ABNORMAL LOW (ref 8.9–10.3)
Chloride: 108 mmol/L (ref 98–111)
Creatinine, Ser: 0.75 mg/dL (ref 0.44–1.00)
GFR, Estimated: >60 mL/min (ref >60)
Glucose, Bld: 153 mg/dL — ABNORMAL HIGH (ref 70–99)
Potassium: 3.1 mmol/L — ABNORMAL LOW (ref 3.5–5.1)
Sodium: 139 mmol/L (ref 135–145)

## 2023-07-04 LAB — CBC WITH DIFFERENTIAL/PLATELET
Abs Immature Granulocytes: 0.03 10*3/uL (ref 0.00–0.07)
Basophils Absolute: 0 10*3/uL (ref 0.0–0.1)
Basophils Relative: 0 %
Eosinophils Absolute: 0 10*3/uL (ref 0.0–0.5)
Eosinophils Relative: 0 %
HCT: 34.7 % — ABNORMAL LOW (ref 36.0–46.0)
Hemoglobin: 11.1 g/dL — ABNORMAL LOW (ref 12.0–15.0)
Immature Granulocytes: 1 %
Lymphocytes Relative: 9 %
Lymphs Abs: 0.2 10*3/uL — ABNORMAL LOW (ref 0.7–4.0)
MCH: 30.5 pg (ref 26.0–34.0)
MCHC: 32 g/dL (ref 30.0–36.0)
MCV: 95.3 fL (ref 80.0–100.0)
Monocytes Absolute: 0 10*3/uL — ABNORMAL LOW (ref 0.1–1.0)
Monocytes Relative: 1 %
Neutro Abs: 2 10*3/uL (ref 1.7–7.7)
Neutrophils Relative %: 89 %
Platelets: 76 10*3/uL — ABNORMAL LOW (ref 150–400)
RBC: 3.64 MIL/uL — ABNORMAL LOW (ref 3.87–5.11)
RDW: 14.6 % (ref 11.5–15.5)
WBC: 2.2 10*3/uL — ABNORMAL LOW (ref 4.0–10.5)
nRBC: 0 % (ref 0.0–0.2)

## 2023-07-04 LAB — MAGNESIUM: Magnesium: 2.2 mg/dL (ref 1.7–2.4)

## 2023-07-04 MED ORDER — SODIUM CHLORIDE 0.9% FLUSH: 10.0000 mL | INTRAVENOUS | Status: DC | PRN

## 2023-07-04 MED ORDER — POTASSIUM CHLORIDE 20 MEQ PO PACK
40.0000 meq | PACK | Freq: Once | ORAL | Status: AC
  Administered 2023-07-04: 40 meq via ORAL
  Filled 2023-07-04: qty 2

## 2023-07-04 MED ORDER — POTASSIUM CHLORIDE 10 MEQ/100ML IV SOLN
10.0000 meq | INTRAVENOUS | Status: DC
  Filled 2023-07-04: qty 100

## 2023-07-04 NOTE — Progress Notes (Signed)
*  PRELIMINARY RESULTS* Echocardiogram 2D Echocardiogram has been performed.  Elaine Allen 07/04/2023, 9:12 AM

## 2023-07-04 NOTE — Progress Notes (Addendum)
 PROGRESS NOTE  Elaine Allen FMW:[id number] DOB: 03/27/1955 DOA: 06/30/2023 PCP: [name], NP   LOS: 4 days   Brief narrative:  Patient is a 68 yo female with history of lupus on multiple immunosuppressants and steroids presented to the hospital with progressive sore throat fever.  History of admission a month back for fevers.  At this time, patient was unable to eat or drink and had difficulty talking due to throat pain.  ENT was contacted by ED provider and patient was admitted to hospital for steroids and antibiotics.  Patient was initially admitted to the ICU service and was considered be stable for transfer out of the ICU.   Assessment/Plan: Principal Problem:   Epiglottitis  Acute epiglottis Pharyngitis, acute Respiratory viral panel negative.  CT scanning of the soft tissue of the neck on admit with acute epiglottitis/supraglottitis, supraglottic airway remains patent with no discrete abscess or drainable fluid collection.  On IV dexamethasone .   Continue aspiration precautions.  On empiric Unasyn  and  linezolid as well.  Blood cultures negative in 2 days.  Patient continues to have improvement in her throat with ability to swallow better.  Was seen by ENT and no concerns for surgical intervention.  Will discontinue linezolid today.  DisContinue dexamethasone .  Oral thrush extensive, possibility of candidal esophagitis. Continue IV fluconazole  and nystatin  swish and swallow   Lupus with chronic immunosuppression  Methotrexate at home.  Will hold for now.   Anemia of chronic disease    Latest hemoglobin of 11.5 from 12.2  Mild leukopenia noted improved..  Mild thrombocytopenia as well.  Will monitor if continues to go down we will need to de-escalate antibiotic.  Hypokalemia.  Will replenish with IV KCl today.  Check levels in AM.  H/o CVA Supportive care at this time.  No acute issues.   Essential hypertension  Hyperlipidemia  Continue home medications including  Norvasc , Hyzaar, Crestor  continue to monitor blood pressure.  Syncopal episode yesterday.  Had some diarrhea.  Likely volume related.  Received IV fluids.  Hold off with MiraLAX .  Check 2D echocardiogram.  DVT prophylaxis: SCDs Start: 06/30/23 2137    Disposition: Home likely in 1 to 2 days  Status is: Inpatient Remains inpatient appropriate because: Pending clinical improvement, IV antibiotic, IV antifungals,   Code Status:     Code Status: Full Code  Family Communication: None at bedside  Consultants: PCCM  Procedures: None  Anti-infectives:  Unasyn  and linezolid Fluconazole   Anti-infectives (From admission, onward)    Start     Dose/Rate Route Frequency Ordered Stop   07/02/23 1400  Ampicillin -Sulbactam (UNASYN ) 3 g in sodium chloride  0.9 % 100 mL IVPB        3 g 200 mL/hr over 30 Minutes Intravenous Every 6 hours 07/02/23 1109     07/02/23 1200  linezolid (ZYVOX) IVPB 600 mg        600 mg 300 mL/hr over 60 Minutes Intravenous Every 12 hours 07/02/23 1110     07/01/23 1200  fluconazole  (DIFLUCAN ) IVPB 200 mg        200 mg 100 mL/hr over 60 Minutes Intravenous Every 24 hours 07/01/23 1101 07/08/23 1159   07/01/23 0000  Ampicillin -Sulbactam (UNASYN ) 3 g in sodium chloride  0.9 % 100 mL IVPB  Status:  Discontinued        3 g 200 mL/hr over 30 Minutes Intravenous Every 8 hours 06/30/23 2155 07/02/23 1109   06/30/23 1645  ceFEPIme  (MAXIPIME ) 2 g in sodium chloride  0.9 % 100  mL IVPB        2 g 200 mL/hr over 30 Minutes Intravenous  Once 06/30/23 1630 06/30/23 1837   06/30/23 1645  metroNIDAZOLE  (FLAGYL ) IVPB 500 mg        500 mg 100 mL/hr over 60 Minutes Intravenous  Once 06/30/23 1630 06/30/23 2150   06/30/23 1645  vancomycin  (VANCOCIN ) IVPB 1000 mg/200 mL premix        1,000 mg 200 mL/hr over 60 Minutes Intravenous  Once 06/30/23 1630 06/30/23 1839      Subjective: Today, patient was seen and examined at bedside.  Patient states that she continues to feel better  with swallowing with less pain.  Eating better.  Had syncopal episode yesterday and getting echocardiogram.  Objective: Vitals:   07/03/23 2028 07/04/23 0446  BP: (!) 150/82 (!) 146/84  Pulse: 80 87  Resp: 18 18  Temp: 98.6 F (37 C) 98.3 F (36.8 C)  SpO2: 99% 100%   No intake or output data in the 24 hours ending 07/04/23 0954  Filed Weights   06/30/23 2317 07/03/23 0524 07/04/23 0500  Weight: 77.2 kg 79.4 kg 79.5 kg   Body mass index is 31.05 kg/m.   Physical Exam:  GENERAL: Patient is alert awake and oriented. Not in obvious distress.  Obese. HENT: No scleral pallor or icterus. Pupils equally reactive to light. Oral mucosa is moist, alopecia noted.  Whitish discoloration of the oral mucosa.  Able to open her oral cavity better today. NECK: is supple, no gross swelling noted. CHEST: Decreased breath sounds bilaterally. CVS: S1 and S2 heard, no murmur. Regular rate and rhythm.  ABDOMEN: Soft, non-tender, bowel sounds are present. EXTREMITIES: No edema. CNS: Cranial nerves are intact. No focal motor deficits. SKIN: warm and dry, baldness  Data Review: I have personally reviewed the following laboratory data and studies,  CBC: Recent Labs  Lab 06/30/23 1543 07/01/23 0321 07/03/23 0741 07/04/23 0546  WBC 1.8* 3.5* 1.3* 2.2*  NEUTROABS 1.2*  --   --  2.0  HGB 12.7 12.2 11.5* 11.1*  HCT 39.7 38.1 36.6 34.7*  MCV 94.3 94.1 96.1 95.3  PLT 152 113* 96* 76*   Basic Metabolic Panel: Recent Labs  Lab 06/30/23 1543 07/01/23 0321 07/03/23 0741 07/04/23 0546  NA 138 139 140 139  K 3.8 3.7 3.7 3.1*  CL 105 107 107 108  CO2 23 20* 21* 22  GLUCOSE 107* 184* 150* 153*  BUN 12 10 27* 26*  CREATININE 1.04* 0.82 0.84 0.75  CALCIUM  9.3 8.8* 8.9 8.5*  MG  --  1.8 2.5* 2.2   Liver Function Tests: Recent Labs  Lab 06/30/23 1543  AST 40  ALT 26  ALKPHOS 102  BILITOT 1.9*  PROT 7.2  ALBUMIN 4.3   No results for input(s): LIPASE, AMYLASE in the last 168  hours. No results for input(s): AMMONIA in the last 168 hours. Cardiac Enzymes: No results for input(s): CKTOTAL, CKMB, CKMBINDEX, TROPONINI in the last 168 hours. BNP (last 3 results) Recent Labs    06/03/23 1620  BNP 24.2    ProBNP (last 3 results) No results for input(s): PROBNP in the last 8760 hours.  CBG: No results for input(s): GLUCAP in the last 168 hours. Recent Results (from the past 240 hours)  Culture, blood (Routine x 2)     Status: None (Preliminary result)   Collection Time: 06/30/23  4:34 PM   Specimen: BLOOD RIGHT ARM  Result Value Ref Range Status   Specimen Description  Final    BLOOD RIGHT ARM Performed at [facility], [address], [place], [place] [address]    Special Requests   Final    BOTTLES DRAWN AEROBIC AND ANAEROBIC Blood Culture results may not be optimal due to an inadequate volume of blood received in culture bottles Performed at [facility], [address], [place], [place] [address]    Culture   Final    NO GROWTH 4 DAYS Performed at [facility], [address], [place], [place] [address]    Report Status PENDING  Incomplete  Culture, blood (Routine x 2)     Status: None (Preliminary result)   Collection Time: 06/30/23  4:38 PM   Specimen: Right Antecubital; Blood  Result Value Ref Range Status   Specimen Description   Final    RIGHT ANTECUBITAL Performed at [facility], [address] [place], [place] [address]    Special Requests   Final    BOTTLES DRAWN AEROBIC AND ANAEROBIC Blood Culture results may not be optimal due to an inadequate volume of blood received in culture bottles Performed at [facility], [address] [place], [place] [address]    Culture   Final    NO GROWTH 4 DAYS Performed at [facility], [address], [place], [place] [address]    Report Status PENDING  Incomplete  Resp panel by RT-PCR (RSV,  Flu A&B, Covid) Throat     Status: None   Collection Time: 06/30/23  5:04 PM   Specimen: Throat; Nasal Swab  Result Value Ref Range Status   SARS Coronavirus 2 by RT PCR NEGATIVE NEGATIVE Final    Comment: (NOTE) SARS-CoV-2 target nucleic acids are NOT DETECTED.  The SARS-CoV-2 RNA is generally detectable in upper respiratory specimens during the acute phase of infection. The lowest concentration of SARS-CoV-2 viral copies this assay can detect is 138 copies/mL. A negative result does not preclude SARS-Cov-2 infection and should not be used as the sole basis for treatment or other patient management decisions. A negative result may occur with  improper specimen collection/handling, submission of specimen other than nasopharyngeal swab, presence of viral mutation(s) within the areas targeted by this assay, and inadequate number of viral copies(<138 copies/mL). A negative result must be combined with clinical observations, patient history, and epidemiological information. The expected result is Negative.  Fact Sheet for Patients:  BloggerCourse.com  Fact Sheet for Healthcare Providers:  SeriousBroker.it  This test is no t yet approved or cleared by the [place]  FDA and  has been authorized for detection and/or diagnosis of SARS-CoV-2 by FDA under an Emergency Use Authorization (EUA). This EUA will remain  in effect (meaning this test can be used) for the duration of the COVID-19 declaration under Section 564(b)(1) of the Act, 21 U.S.C.section 360bbb-3(b)(1), unless the authorization is terminated  or revoked sooner.       Influenza A by PCR NEGATIVE NEGATIVE Final   Influenza B by PCR NEGATIVE NEGATIVE Final    Comment: (NOTE) The Xpert Xpress SARS-CoV-2/FLU/RSV plus assay is intended as an aid in the diagnosis of influenza from Nasopharyngeal swab specimens and should not be used as a sole basis for treatment. Nasal  washings and aspirates are unacceptable for Xpert Xpress SARS-CoV-2/FLU/RSV testing.  Fact Sheet for Patients: BloggerCourse.com  Fact Sheet for Healthcare Providers: SeriousBroker.it  This test is not yet approved or cleared by the [place]  FDA and has been authorized  for detection and/or diagnosis of SARS-CoV-2 by FDA under an Emergency Use Authorization (EUA). This EUA will remain in effect (meaning this test can be used) for the duration of the COVID-19 declaration under Section 564(b)(1) of the Act, 21 U.S.C. section 360bbb-3(b)(1), unless the authorization is terminated or revoked.     Resp Syncytial Virus by PCR NEGATIVE NEGATIVE Final    Comment: (NOTE) Fact Sheet for Patients: BloggerCourse.com  Fact Sheet for Healthcare Providers: SeriousBroker.it  This test is not yet approved or cleared by the [place]  FDA and has been authorized for detection and/or diagnosis of SARS-CoV-2 by FDA under an Emergency Use Authorization (EUA). This EUA will remain in effect (meaning this test can be used) for the duration of the COVID-19 declaration under Section 564(b)(1) of the Act, 21 U.S.C. section 360bbb-3(b)(1), unless the authorization is terminated or revoked.  Performed at [facility], [address], [place], [place] [address]   Group A Strep by PCR     Status: None   Collection Time: 06/30/23  5:04 PM   Specimen: Throat; Sterile Swab  Result Value Ref Range Status   Group A Strep by PCR NOT DETECTED NOT DETECTED Final    Comment: Performed at [facility], [address], [place], [place] [address]  MRSA Next Gen by PCR, Nasal     Status: None   Collection Time: 06/30/23  9:56 PM   Specimen: Nasal Mucosa; Nasal Swab  Result Value Ref Range Status   MRSA by PCR Next Gen NOT DETECTED NOT DETECTED Final    Comment:  (NOTE) The GeneXpert MRSA Assay (FDA approved for NASAL specimens only), is one component of a comprehensive MRSA colonization surveillance program. It is not intended to diagnose MRSA infection nor to guide or monitor treatment for MRSA infections. Test performance is not FDA approved in patients less than 42 years old. Performed at [facility], [address], [place], [place] [address]   Respiratory (~20 pathogens) panel by PCR     Status: None   Collection Time: 06/30/23  9:56 PM   Specimen: Nasal Mucosa; Respiratory  Result Value Ref Range Status   Adenovirus NOT DETECTED NOT DETECTED Final   Coronavirus 229E NOT DETECTED NOT DETECTED Final    Comment: (NOTE) The Coronavirus on the Respiratory Panel, DOES NOT test for the novel  Coronavirus (2019 nCoV)    Coronavirus HKU1 NOT DETECTED NOT DETECTED Final   Coronavirus NL63 NOT DETECTED NOT DETECTED Final   Coronavirus OC43 NOT DETECTED NOT DETECTED Final   Metapneumovirus NOT DETECTED NOT DETECTED Final   Rhinovirus / Enterovirus NOT DETECTED NOT DETECTED Final   Influenza A NOT DETECTED NOT DETECTED Final   Influenza B NOT DETECTED NOT DETECTED Final   Parainfluenza Virus 1 NOT DETECTED NOT DETECTED Final   Parainfluenza Virus 2 NOT DETECTED NOT DETECTED Final   Parainfluenza Virus 3 NOT DETECTED NOT DETECTED Final   Parainfluenza Virus 4 NOT DETECTED NOT DETECTED Final   Respiratory Syncytial Virus NOT DETECTED NOT DETECTED Final   Bordetella pertussis NOT DETECTED NOT DETECTED Final   Bordetella Parapertussis NOT DETECTED NOT DETECTED Final   Chlamydophila pneumoniae NOT DETECTED NOT DETECTED Final   Mycoplasma pneumoniae NOT DETECTED NOT DETECTED Final    Comment: Performed at [facility], [address] [place], [place] [address]     Studies: No results found.   Vernal Alstrom, MD  [facility] 07/04/2023  If 7PM-7AM, please contact night-coverage

## 2023-07-04 NOTE — Progress Notes (Signed)
*  PRELIMINARY RESULTS* Echocardiogram 2D Echocardiogram has been performed.  Elaine Allen 07/04/2023, 9:04 AM

## 2023-07-05 DIAGNOSIS — J0510 Acute epiglottitis without obstruction: Secondary | ICD-10-CM | POA: Diagnosis not present

## 2023-07-05 LAB — CBC WITH DIFFERENTIAL/PLATELET
Abs Immature Granulocytes: 0.02 10*3/uL (ref 0.00–0.07)
Basophils Absolute: 0 10*3/uL (ref 0.0–0.1)
Basophils Relative: 0 %
Eosinophils Absolute: 0 10*3/uL (ref 0.0–0.5)
Eosinophils Relative: 0 %
HCT: 33.1 % — ABNORMAL LOW (ref 36.0–46.0)
Hemoglobin: 10.8 g/dL — ABNORMAL LOW (ref 12.0–15.0)
Immature Granulocytes: 1 %
Lymphocytes Relative: 11 %
Lymphs Abs: 0.3 10*3/uL — ABNORMAL LOW (ref 0.7–4.0)
MCH: 30.3 pg (ref 26.0–34.0)
MCHC: 32.6 g/dL (ref 30.0–36.0)
MCV: 93 fL (ref 80.0–100.0)
Monocytes Absolute: 0.1 10*3/uL (ref 0.1–1.0)
Monocytes Relative: 2 %
Neutro Abs: 2.4 10*3/uL (ref 1.7–7.7)
Neutrophils Relative %: 86 %
Platelets: 59 10*3/uL — ABNORMAL LOW (ref 150–400)
RBC: 3.56 MIL/uL — ABNORMAL LOW (ref 3.87–5.11)
RDW: 14.3 % (ref 11.5–15.5)
WBC: 2.8 10*3/uL — ABNORMAL LOW (ref 4.0–10.5)
nRBC: 0 % (ref 0.0–0.2)

## 2023-07-05 LAB — CULTURE, BLOOD (ROUTINE X 2)
Culture: NO GROWTH
Culture: NO GROWTH

## 2023-07-05 LAB — BASIC METABOLIC PANEL WITH GFR
Anion gap: 6 (ref 5–15)
BUN: 27 mg/dL — ABNORMAL HIGH (ref 8–23)
CO2: 24 mmol/L (ref 22–32)
Calcium: 8.5 mg/dL — ABNORMAL LOW (ref 8.9–10.3)
Chloride: 109 mmol/L (ref 98–111)
Creatinine, Ser: 0.74 mg/dL (ref 0.44–1.00)
GFR, Estimated: >60 mL/min (ref >60)
Glucose, Bld: 135 mg/dL — ABNORMAL HIGH (ref 70–99)
Potassium: 3.4 mmol/L — ABNORMAL LOW (ref 3.5–5.1)
Sodium: 139 mmol/L (ref 135–145)

## 2023-07-05 MED ORDER — NYSTATIN 100000 UNIT/ML MT SUSP: 5.0000 mL | Freq: Four times a day (QID) | OROMUCOSAL | 0 refills | Status: DC

## 2023-07-05 MED ORDER — POTASSIUM CHLORIDE 20 MEQ PO PACK: 40.0000 meq | PACK | Freq: Once | ORAL | Status: DC

## 2023-07-05 MED ORDER — POTASSIUM CHLORIDE CRYS ER 20 MEQ PO TBCR: 20.0000 meq | EXTENDED_RELEASE_TABLET | Freq: Every day | ORAL | 0 refills | Status: AC

## 2023-07-05 MED ORDER — POTASSIUM CHLORIDE CRYS ER 20 MEQ PO TBCR
40.0000 meq | EXTENDED_RELEASE_TABLET | Freq: Once | ORAL | Status: AC
  Administered 2023-07-05: 40 meq via ORAL
  Filled 2023-07-05: qty 2

## 2023-07-05 MED ORDER — AMOXICILLIN-POT CLAVULANATE 875-125 MG PO TABS: 1.0000 | ORAL_TABLET | Freq: Two times a day (BID) | ORAL | 0 refills | Status: AC

## 2023-07-05 MED ORDER — FLUCONAZOLE 200 MG PO TABS
200.0000 mg | ORAL_TABLET | Freq: Every day | ORAL | 0 refills | Status: AC
Start: 2023-07-05 — End: 2023-07-10

## 2023-07-05 NOTE — Plan of Care (Signed)

## 2023-07-05 NOTE — Progress Notes (Signed)
 RN DISCHARGE NOTE  Discharge instructions were reviewed with the patient via this RN. Patient verbally demonstrated understanding of the instructions and teachings that were provided.   At this time of discharge, the patient is fully alert and oriented, and show no signs or symptoms of complications or distress. The patient independently ambulatory.   Patient's midline was removed, with tape and gauze dressing applied to the site. Tele monitor was also removed and returned to [facility].   Patient to be taken to the main entrance via wheelchair where she will be further transported to her residence by her grandson in private vehicle.

## 2023-07-05 NOTE — Discharge Summary (Signed)
 Physician Discharge Summary  Elaine Allen FMW:[id number] DOB: [birth year] DOA: 06/30/2023  PCP: [name], NP  Admit date: 06/30/2023 Discharge date: 07/05/2023  Admitted From: Home  Discharge disposition: Home   Recommendations for Outpatient Follow-Up:   Follow up with your primary care provider in one week.  Check CBC, BMP, magnesium in the next visit Follow-up for further leukopenia and thrombocytopenia as outpatient Follow-up with rheumatologist as outpatient.   Discharge Diagnosis:   Principal Problem:   Epiglottitis Acute pharyngitis   Discharge Condition: Improved.  Diet recommendation: Soft diet.  Wound care: None.  Code status: Full.   History of Present Illness:   Patient is a 68 yo female with history of lupus on multiple immunosuppressants and steroids presented to the hospital with progressive sore throat fever. History of admission a month back for fevers. Patient was unable to eat or drink and had difficulty talking due to throat pain. ENT was contacted by ED provider and patient was admitted to hospital for steroids and antibiotics. Patient was initially admitted to the ICU service due to concerns for acute epiglottitis   Hospital Course:   Following conditions were addressed during hospitalization as listed below,  Acute epiglottis Pharyngitis, acute Respiratory viral panel negative.  CT scanning of the soft tissue of the neck on admit with acute epiglottitis/supraglottitis, supraglottic airway remains patent with no discrete abscess or drainable fluid collection.  Initially received IV dexamethasone , empiric Unasyn  and  linezolid as well.  Blood cultures negative.  Patient continued to improve.  Was also seen by ENT and no concerns for any intervention.  Will continue Augmentin  for the next few days and discharged to complete course  Oral thrush extensive Received IV fluconazole  and nystatin  swish and swallow during hospitalization and  will be continued on oral for next few days to complete the course   Lupus with chronic immunosuppression  Methotrexate at home.     Anemia of chronic disease  Latest hemoglobin was 10.8 prior to discharge.  Monitor as outpatient.   Mild leukopenia and leukopenia noted during hospitalization.  Would recommend outpatient follow-up  Hypokalemia.  Improved after repletion.  Patient given 40 mill equivalents for potassium of 3.4 prior to discharge and will be continued on potassium for next few days and discharged   H/o CVA Supportive care at this time.  No acute issues.   Essential hypertension  Hyperlipidemia  Patient is on Norvasc , Hyzaar, Crestor  continue to monitor blood pressure.   Syncopal episode   Secondary to volume depletion.  Has improved.  2D echocardiogram with LV ejection fraction of 60 to 65% with grade 1 diastolic dysfunction.  Disposition.  At this time, patient is stable for disposition home with outpatient PCP follow-up.  Medical Consultants:   ENT  Procedures:    None Subjective:   Today, patient was seen and examined at bedside.  Patient continues to feel better with swallowing.  No pain.  Has been able to tolerate solid diet.  Has ambulated.  Wishes to go home.  Discharge Exam:   Vitals:   07/04/23 2028 07/05/23 0428  BP: 132/79 136/83  Pulse: 73 67  Resp: 18 17  Temp: 98.1 F (36.7 C) 98.4 F (36.9 C)  SpO2: 97% 98%   Vitals:   07/04/23 1210 07/04/23 2028 07/05/23 0428 07/05/23 0500  BP: 125/76 132/79 136/83   Pulse: 76 73 67   Resp: 16 18 17    Temp: 98.1 F (36.7 C) 98.1 F (36.7 C) 98.4 F (36.9 C)  TempSrc: Oral Oral Oral   SpO2: 98% 97% 98%   Weight:    80 kg  Height:      Body mass index is 31.24 kg/m.   General: Alert awake, not in obvious distress, obese built HENT: pupils equally reacting to light,  No scleral pallor or icterus noted. Oral mucosa is moist.  Whitish discoloration of the tongue. Chest: Decreased breath sounds  bilaterally. CVS: S1 &S2 heard. No murmur.  Regular rate and rhythm. Abdomen: Soft, nontender, nondistended.  Bowel sounds are heard.   Extremities: No cyanosis, clubbing or edema.  Peripheral pulses are palpable. Psych: Alert, awake and oriented, normal mood CNS:  No cranial nerve deficits.  Power equal in all extremities.   Skin: Warm and dry.  No rashes noted.  Alopecia noted.  The results of significant diagnostics from this hospitalization (including imaging, microbiology, ancillary and laboratory) are listed below for reference.     Diagnostic Studies:   CT Soft Tissue Neck W Contrast Result Date: 06/30/2023 CLINICAL DATA:  Initial evaluation for acute sore throat, soft tissue infection suspected. EXAM: CT NECK WITH CONTRAST TECHNIQUE: Multidetector CT imaging of the neck was performed using the standard protocol following the bolus administration of intravenous contrast. RADIATION DOSE REDUCTION: This exam was performed according to the departmental dose-optimization program which includes automated exposure control, adjustment of the mA and/or kV according to patient size and/or use of iterative reconstruction technique. CONTRAST:  100mL OMNIPAQUE  IOHEXOL  300 MG/ML  SOLN COMPARISON:  None available. FINDINGS: Pharynx and larynx: Oral cavity within normal limits. Mucosal edema present throughout the or pharyngeal mucosa. Epiglottis is mildly thickened and edematous in appearance as are the aryepiglottic folds. Findings suggestive of acute supraglottitis. Supraglottic airway remains patent at this time. Trace layering secretions noted within the hypopharynx. No retropharyngeal collection. Glottis within normal limits. Subglottic airway patent clear. No discrete abscess or drainable fluid collection. Salivary glands: Salivary glands including the parotid and submandibular glands are within normal limits. Thyroid : Prior right hemithyroidectomy. Left lobe of thyroid  normal. Lymph nodes: No enlarged  or pathologic lymph nodes within the neck. Vascular: Normal intravascular enhancement seen within the neck. Mild aortic atherosclerosis. Limited intracranial: Probable small remote left cerebellar infarct. Otherwise unremarkable. Visualized orbits: Scleral buckle in place at the right globe. Globes orbital soft tissues otherwise unremarkable. Mastoids and visualized paranasal sinuses: Visualized paranasal sinuses are largely clear. Mastoid air cells and middle ear cavities are well pneumatized and free of fluid. Skeleton: No worrisome osseous lesions. Mild to moderate cervical spondylosis, most pronounced at C4-5. Patient is edentulous. Upper chest: No other acute finding. Linear atelectasis and/or scarring present at the posterior right upper lobe. Other: None. IMPRESSION: 1. Findings suggestive of acute epiglottitis/supraglottitis as above. Supraglottic airway remains patent at this time. No discrete abscess or drainable fluid collection. 2. No other acute abnormality within the neck. 3.  Aortic Atherosclerosis (ICD10-I70.0). Electronically Signed   By: Morene Hoard M.D.   On: 06/30/2023 19:21   CT CHEST ABDOMEN PELVIS W CONTRAST Result Date: 06/30/2023 CLINICAL DATA:  Sepsis PT reports sore throat, abdominal pain and chills. Pt does undergo immunotherapy for lupus. Pt has a fever of 102.9 during triage. EXAM: CT CHEST, ABDOMEN, AND PELVIS WITH CONTRAST TECHNIQUE: Multidetector CT imaging of the chest, abdomen and pelvis was performed following the standard protocol during bolus administration of intravenous contrast. RADIATION DOSE REDUCTION: This exam was performed according to the departmental dose-optimization program which includes automated exposure control, adjustment of the mA and/or kV according  to patient size and/or use of iterative reconstruction technique. CONTRAST:  OMNIPAQUE  IOHEXOL  300 MG/ML  SOLN COMPARISON:  None Available. FINDINGS: CT CHEST FINDINGS Cardiovascular: Normal heart  size. No significant pericardial effusion. The thoracic aorta is normal in caliber. Mild-to-moderate atherosclerotic plaque of the thoracic aorta. No coronary artery calcifications. Mediastinum/Nodes: No enlarged mediastinal, hilar, or axillary lymph nodes. Thyroid  gland, trachea, and esophagus demonstrate no significant findings. Partial right thyroidectomy. Tiny hiatal hernia. Lungs/Pleura: No focal consolidation. No pulmonary nodule. No pulmonary mass. No pleural effusion. No pneumothorax. Musculoskeletal: No chest wall abnormality. Non-wireless left chest wall cardiac device. No suspicious lytic or blastic osseous lesions. No acute displaced fracture. Multilevel mild degenerative change of the spine. CT ABDOMEN PELVIS FINDINGS Hepatobiliary: No focal liver abnormality. No gallstones, gallbladder wall thickening, or pericholecystic fluid. No biliary dilatation. Pancreas: No focal lesion. Normal pancreatic contour. No surrounding inflammatory changes. No main pancreatic ductal dilatation. Spleen: Normal in size without focal abnormality. Adrenals/Urinary Tract: No adrenal nodule bilaterally. Bilateral kidneys enhance symmetrically. No hydronephrosis. No hydroureter. 4 mm right nephrolithiasis. No left nephrolithiasis. Bilateral parapelvic simple cysts - simple renal cysts, in the absence of clinically indicated signs/symptoms, require no independent follow-up. The urinary bladder is unremarkable. Stomach/Bowel: Stomach is within normal limits. No evidence of bowel wall thickening or dilatation. Appendix appears normal. Vascular/Lymphatic: No abdominal aorta or iliac aneurysm. No abdominal, pelvic, or inguinal lymphadenopathy. Reproductive: Not well visualized due to streak artifact originating from the left femoral surgical hardware. Other: No intraperitoneal free fluid. No intraperitoneal free gas. No organized fluid collection. Musculoskeletal: No abdominal wall hernia or abnormality. No suspicious lytic or  blastic osseous lesions. No acute displaced fracture. L3 vertebral body hemangioma. IMPRESSION: No acute intrathoracic, intra-abdominal, or intrapelvic abnormality. Electronically Signed   By: Morgane  Naveau M.D.   On: 06/30/2023 19:12   DG Chest Port 1 View Result Date: 06/30/2023 CLINICAL DATA:  [id number] Sepsis [facility]) [id number] EXAM: PORTABLE CHEST - 1 VIEW COMPARISON:  Jun 03, 2023 FINDINGS: Prominence of the right paratracheal stripe and right hilar region. Lower lung volumes. No focal airspace consolidation, pleural effusion, or pneumothorax. No cardiomegaly. No acute fracture or destructive lesion. IMPRESSION: Lower lung volumes. Prominence of the right lower paratracheal stripe and right hilar region, which may be artifactual due to low lung volumes and patient rotation. Alternatively, perihilar airspace disease or developing lymphadenopathy could have this appearance. Nonemergent chest CT should be considered for further characterization. Electronically Signed   By: Rogelia Myers M.D.   On: 06/30/2023 16:32     Labs:   Basic Metabolic Panel: Recent Labs  Lab 06/30/23 1543 07/01/23 0321 07/03/23 0741 07/04/23 0546 07/05/23 0525  NA 138 139 140 139 139  K 3.8 3.7 3.7 3.1* 3.4*  CL 105 107 107 108 109  CO2 23 20* 21* 22 24  GLUCOSE 107* 184* 150* 153* 135*  BUN 12 10 27* 26* 27*  CREATININE 1.04* 0.82 0.84 0.75 0.74  CALCIUM  9.3 8.8* 8.9 8.5* 8.5*  MG  --  1.8 2.5* 2.2  --    GFR Estimated Creatinine Clearance: 66.4 mL/min (by C-G formula based on SCr of 0.74 mg/dL). Liver Function Tests: Recent Labs  Lab 06/30/23 1543  AST 40  ALT 26  ALKPHOS 102  BILITOT 1.9*  PROT 7.2  ALBUMIN 4.3   No results for input(s): LIPASE, AMYLASE in the last 168 hours. No results for input(s): AMMONIA in the last 168 hours. Coagulation profile Recent Labs  Lab 06/30/23 1543  INR 1.0  CBC: Recent Labs  Lab 06/30/23 1543 07/01/23 0321 07/03/23 0741 07/04/23 0546  07/05/23 0525  WBC 1.8* 3.5* 1.3* 2.2* 2.8*  NEUTROABS 1.2*  --   --  2.0 2.4  HGB 12.7 12.2 11.5* 11.1* 10.8*  HCT 39.7 38.1 36.6 34.7* 33.1*  MCV 94.3 94.1 96.1 95.3 93.0  PLT 152 113* 96* 76* 59*   Cardiac Enzymes: No results for input(s): CKTOTAL, CKMB, CKMBINDEX, TROPONINI in the last 168 hours. BNP: Invalid input(s): POCBNP CBG: No results for input(s): GLUCAP in the last 168 hours. D-Dimer No results for input(s): DDIMER in the last 72 hours. Hgb A1c No results for input(s): HGBA1C in the last 72 hours. Lipid Profile No results for input(s): CHOL, HDL, LDLCALC, TRIG, CHOLHDL, LDLDIRECT in the last 72 hours. Thyroid  function studies No results for input(s): TSH, T4TOTAL, T3FREE, THYROIDAB in the last 72 hours.  Invalid input(s): FREET3 Anemia work up No results for input(s): VITAMINB12, FOLATE, FERRITIN, TIBC, IRON, RETICCTPCT in the last 72 hours. Microbiology Recent Results (from the past 240 hours)  Culture, blood (Routine x 2)     Status: None   Collection Time: 06/30/23  4:34 PM   Specimen: BLOOD RIGHT ARM  Result Value Ref Range Status   Specimen Description   Final    BLOOD RIGHT ARM Performed at [facility], [address] [place], [place] [address]    Special Requests   Final    BOTTLES DRAWN AEROBIC AND ANAEROBIC Blood Culture results may not be optimal due to an inadequate volume of blood received in culture bottles Performed at [facility], [address], [place], [place] [address]    Culture   Final    NO GROWTH 5 DAYS Performed at [facility], [address], [place], [place] [address]    Report Status 07/05/2023 FINAL  Final  Culture, blood (Routine x 2)     Status: None   Collection Time: 06/30/23  4:38 PM   Specimen: Right Antecubital; Blood  Result Value Ref Range Status   Specimen Description   Final    RIGHT ANTECUBITAL Performed at [facility], [address], [place], [place] [address]    Special Requests   Final    BOTTLES DRAWN AEROBIC AND ANAEROBIC Blood Culture results may not be optimal due to an inadequate volume of blood received in culture bottles Performed at [facility], [address] [place], [place] [address]    Culture   Final    NO GROWTH 5 DAYS Performed at [facility], [address], [place], [place] [address]    Report Status 07/05/2023 FINAL  Final  Resp panel by RT-PCR (RSV, Flu A&B, Covid) Throat     Status: None   Collection Time: 06/30/23  5:04 PM   Specimen: Throat; Nasal Swab  Result Value Ref Range Status   SARS Coronavirus 2 by RT PCR NEGATIVE NEGATIVE Final    Comment: (NOTE) SARS-CoV-2 target nucleic acids are NOT DETECTED.  The SARS-CoV-2 RNA is generally detectable in upper respiratory specimens during the acute phase of infection. The lowest concentration of SARS-CoV-2 viral copies this assay can detect is 138 copies/mL. A negative result does not preclude SARS-Cov-2 infection and should not be used as the sole basis for treatment or other patient management decisions. A negative result may occur with  improper specimen collection/handling, submission of specimen other than nasopharyngeal swab, presence of viral mutation(s) within the areas  targeted by this assay, and inadequate number of viral copies(<138 copies/mL). A negative result must be combined with clinical observations, patient history, and epidemiological information. The expected result is Negative.  Fact Sheet for Patients:  BloggerCourse.com  Fact Sheet for Healthcare Providers:  SeriousBroker.it  This test is no t yet approved or cleared by the [place]  FDA and  has been authorized for detection and/or diagnosis of SARS-CoV-2 by FDA under an Emergency Use Authorization (EUA). This EUA will remain  in effect  (meaning this test can be used) for the duration of the COVID-19 declaration under Section 564(b)(1) of the Act, 21 U.S.C.section 360bbb-3(b)(1), unless the authorization is terminated  or revoked sooner.       Influenza A by PCR NEGATIVE NEGATIVE Final   Influenza B by PCR NEGATIVE NEGATIVE Final    Comment: (NOTE) The Xpert Xpress SARS-CoV-2/FLU/RSV plus assay is intended as an aid in the diagnosis of influenza from Nasopharyngeal swab specimens and should not be used as a sole basis for treatment. Nasal washings and aspirates are unacceptable for Xpert Xpress SARS-CoV-2/FLU/RSV testing.  Fact Sheet for Patients: BloggerCourse.com  Fact Sheet for Healthcare Providers: SeriousBroker.it  This test is not yet approved or cleared by the [place]  FDA and has been authorized for detection and/or diagnosis of SARS-CoV-2 by FDA under an Emergency Use Authorization (EUA). This EUA will remain in effect (meaning this test can be used) for the duration of the COVID-19 declaration under Section 564(b)(1) of the Act, 21 U.S.C. section 360bbb-3(b)(1), unless the authorization is terminated or revoked.     Resp Syncytial Virus by PCR NEGATIVE NEGATIVE Final    Comment: (NOTE) Fact Sheet for Patients: BloggerCourse.com  Fact Sheet for Healthcare Providers: SeriousBroker.it  This test is not yet approved or cleared by the [place]  FDA and has been authorized for detection and/or diagnosis of SARS-CoV-2 by FDA under an Emergency Use Authorization (EUA). This EUA will remain in effect (meaning this test can be used) for the duration of the COVID-19 declaration under Section 564(b)(1) of the Act, 21 U.S.C. section 360bbb-3(b)(1), unless the authorization is terminated or revoked.  Performed at [facility], [address], [place], [place] [address]   Group A  Strep by PCR     Status: None   Collection Time: 06/30/23  5:04 PM   Specimen: Throat; Sterile Swab  Result Value Ref Range Status   Group A Strep by PCR NOT DETECTED NOT DETECTED Final    Comment: Performed at [facility], [address], [place], [place] [address]  MRSA Next Gen by PCR, Nasal     Status: None   Collection Time: 06/30/23  9:56 PM   Specimen: Nasal Mucosa; Nasal Swab  Result Value Ref Range Status   MRSA by PCR Next Gen NOT DETECTED NOT DETECTED Final    Comment: (NOTE) The GeneXpert MRSA Assay (FDA approved for NASAL specimens only), is one component of a comprehensive MRSA colonization surveillance program. It is not intended to diagnose MRSA infection nor to guide or monitor treatment for MRSA infections. Test performance is not FDA approved in patients less than 55 years old. Performed at [facility], [address] [place], [place] [address]   Respiratory (~20 pathogens) panel by PCR     Status: None   Collection Time: 06/30/23  9:56 PM   Specimen: Nasal Mucosa; Respiratory  Result Value Ref Range Status   Adenovirus NOT DETECTED NOT DETECTED Final  Coronavirus 229E NOT DETECTED NOT DETECTED Final    Comment: (NOTE) The Coronavirus on the Respiratory Panel, DOES NOT test for the novel  Coronavirus (2019 nCoV)    Coronavirus HKU1 NOT DETECTED NOT DETECTED Final   Coronavirus NL63 NOT DETECTED NOT DETECTED Final   Coronavirus OC43 NOT DETECTED NOT DETECTED Final   Metapneumovirus NOT DETECTED NOT DETECTED Final   Rhinovirus / Enterovirus NOT DETECTED NOT DETECTED Final   Influenza A NOT DETECTED NOT DETECTED Final   Influenza B NOT DETECTED NOT DETECTED Final   Parainfluenza Virus 1 NOT DETECTED NOT DETECTED Final   Parainfluenza Virus 2 NOT DETECTED NOT DETECTED Final   Parainfluenza Virus 3 NOT DETECTED NOT DETECTED Final   Parainfluenza Virus 4 NOT DETECTED NOT DETECTED Final   Respiratory Syncytial Virus NOT  DETECTED NOT DETECTED Final   Bordetella pertussis NOT DETECTED NOT DETECTED Final   Bordetella Parapertussis NOT DETECTED NOT DETECTED Final   Chlamydophila pneumoniae NOT DETECTED NOT DETECTED Final   Mycoplasma pneumoniae NOT DETECTED NOT DETECTED Final    Comment: Performed at [facility], [address], [place], [place] [address]     Discharge Instructions:   Discharge Instructions     Call MD for:  persistant nausea and vomiting   Complete by: As directed    Call MD for:  severe uncontrolled pain   Complete by: As directed    Call MD for:  temperature >100.4   Complete by: As directed    Diet general   Complete by: As directed    Discharge instructions   Complete by: As directed    Follow up with your primary care provider in one week. Complete the course of antibiotics.  Check blood work at that time. Seek medical attention for worsening symptoms. Follow up with your oncology as outpatient as has been scheduled.   Increase activity slowly   Complete by: As directed       Allergies as of 07/05/2023       Reactions   Plaquenil [hydroxychloroquine Sulfate] Anaphylaxis, Other (See Comments)   Blisters all over body   Amoxicillin  Other (See Comments)   Azithromycin  Nausea And Vomiting   Lactose Intolerance (gi) Other (See Comments)   Upset stomach   Penicillin G Itching, Nausea Only   Hydroxychloroquine Sulfate Other (See Comments), Rash        Medication List     STOP taking these medications    rosuvastatin  10 MG tablet Commonly known as: CRESTOR        TAKE these medications    amLODipine  10 MG tablet Commonly known as: NORVASC  Take 10 mg by mouth daily.   amoxicillin -clavulanate 875-125 MG tablet Commonly known as: AUGMENTIN  Take 1 tablet by mouth 2 (two) times daily for 5 days.   cyanocobalamin  1000 MCG tablet Commonly known as: VITAMIN B12 Take 3,000 mcg by mouth daily.   fluconazole  200 MG tablet Commonly known as: DIFLUCAN  Take 1  tablet (200 mg total) by mouth daily for 5 days.   folic acid 1 MG tablet Commonly known as: FOLVITE Take 1 mg by mouth once a week.   hydrocortisone 2.5 % cream Apply 1 Application topically daily as needed (rash).   losartan -hydrochlorothiazide  100-25 MG tablet Commonly known as: HYZAAR Take 1 tablet by mouth daily.   methotrexate 2.5 MG tablet Commonly known as: RHEUMATREX Take 15 mg by mouth once a week.   multivitamin with minerals Tabs tablet Take 1 tablet by mouth daily.   nystatin   100000 UNIT/ML suspension Commonly known as: MYCOSTATIN  Take 5 mLs (500,000 Units total) by mouth 4 (four) times daily.   potassium chloride  SA 20 MEQ tablet Commonly known as: KLOR-CON  M Take 1 tablet (20 mEq total) by mouth daily for 10 days.   PROBIOTIC PO Take 1 capsule by mouth daily.   tacrolimus 0.1 % ointment Commonly known as: PROTOPIC Apply 1 Application topically 2 (two) times daily.   thiamine  50 MG tablet Commonly known as: VITAMIN B-1 Take 50 mg by mouth daily.          Time coordinating discharge: 39 minutes  Signed:  Jayde Daffin  [facility] 07/05/2023, 1:53 PM

## 2023-07-16 NOTE — Addendum Note (Signed)
 Addended by: VICCI SELLER A on: 07/16/2023 09:28 AM   Modules accepted: Orders

## 2023-07-16 NOTE — Progress Notes (Signed)
 [facility] Summary Report / Loop Recorder

## 2023-07-26 ENCOUNTER — Ambulatory Visit: Payer: Self-pay

## 2023-07-26 DIAGNOSIS — I4892 Unspecified atrial flutter: Secondary | ICD-10-CM

## 2023-07-27 LAB — CUP PACEART REMOTE DEVICE CHECK
Date Time Interrogation Session: 20250721174209
Implantable Pulse Generator Implant Date: 20250303

## 2023-07-30 ENCOUNTER — Other Ambulatory Visit: Payer: Self-pay

## 2023-07-30 DIAGNOSIS — D693 Immune thrombocytopenic purpura: Secondary | ICD-10-CM

## 2023-07-31 ENCOUNTER — Ambulatory Visit: Payer: Self-pay | Admitting: Cardiovascular Disease

## 2023-08-01 NOTE — Progress Notes (Signed)
 HEMATOLOGY/ONCOLOGY CLINIC VISIT NOTE  Date of Service: 08/02/2023  Patient Care Team: [name], NP as PCP - General Mealor, Eulas BRAVO, MD as PCP - Electrophysiology (Cardiology) Onesimo Emaline Brink, MD as Consulting Physician (Hematology)  REFERRING PHYSICIAN: [name], NP  CHIEF COMPLAINTS/PURPOSE OF CONSULTATION:  Follow-up for continued evaluation and management of ITP related to SLE  INTERVAL HISTORY  Elaine Allen is a 68 y.o. female here for continued evaluation and management of ITP related to SLE.   Patient was last seen by me on 05/12/2023 and reported some mild lupus-related issues only in her ankle and face, and continued fluctuating brain fog.   Patient was admitted with significant leukopenia with severe epiglottitis and suspected sepsis in the end of July and was treated with IV antibiotics.  She notes that she was previously on methotrexate and Plaquenil by her rheumatologist which are currently on hold.  She does report that she has an appointment coming up with her rheumatologist within the next month for follow-up. Labs today were reviewed in details.. Currently the patient notes no fevers no chills no night sweats no unexpected weight loss.  No persistent throat swelling shortness of breath or chest pain.   MEDICAL HISTORY:  Past Medical History:  Diagnosis Date   Arthritis    Blind right eye    sees colors   Chronic anemia    Detached retina, right    corrected good for about 6 months sees peripheral colors only   History of kidney stones    Hypertension    Hyperthyroidism    Lupus    Pneumonia    Retinal detachment    Spasmodic dysphonia    Stroke (HCC) 2015   no permanant defecits had weakness in left arm in hospital Had PT also   Vertigo    Hx of preceding stroke     SURGICAL HISTORY: Past Surgical History:  Procedure Laterality Date   ABDOMINAL HYSTERECTOMY     BREAST EXCISIONAL BIOPSY Left    KNEE SURGERY Left     THYROID  SURGERY     TOTAL HIP ARTHROPLASTY Left 06/27/2018   Procedure: TOTAL HIP ARTHROPLASTY ANTERIOR APPROACH;  Surgeon: Liam Lerner, MD;  Location: [facility];  Service: Orthopedics;  Laterality: Left;     SOCIAL HISTORY: Social History   Socioeconomic History   Marital status: Single    Spouse name: Not on file   Number of children: 2   Years of education: college   Highest education level: Not on file  Occupational History   Occupation: options  Tobacco Use   Smoking status: Never    Passive exposure: Never   Smokeless tobacco: Never  Vaping Use   Vaping status: Never Used  Substance and Sexual Activity   Alcohol use: No   Drug use: No   Sexual activity: Not Currently  Other Topics Concern   Not on file  Social History Narrative   Not on file   Social Drivers of Health   Financial Resource Strain: Low Risk  (08/13/2020)   Overall Financial Resource Strain (CARDIA)    Difficulty of Paying Living Expenses: Not hard at all  Food Insecurity: No Food Insecurity (06/30/2023)   Hunger Vital Sign    Worried About Running Out of Food in the Last Year: Never true    Ran Out of Food in the Last Year: Never true  Transportation Needs: No Transportation Needs (06/30/2023)   PRAPARE - Administrator, Civil Service (Medical):  No    Lack of Transportation (Non-Medical): No  Physical Activity: Sufficiently Active (08/13/2020)   Exercise Vital Sign    Days of Exercise per Week: 5 days    Minutes of Exercise per Session: 30 min  Stress: No Stress Concern Present (08/13/2020)   [employer] of Occupational Health - Occupational Stress Questionnaire    Feeling of Stress : Not at all  Social Connections: Moderately Integrated (06/30/2023)   Social Connection and Isolation Panel    Frequency of Communication with Friends and Family: More than three times a week    Frequency of Social Gatherings with Friends and Family: More than three times a week    Attends Religious  Services: 1 to 4 times per year    Active Member of [employer] or Organizations: No    Attends Banker Meetings: 1 to 4 times per year    Marital Status: Never married  Intimate Partner Violence: Not At Risk (06/30/2023)   Humiliation, Afraid, Rape, and Kick questionnaire    Fear of Current or Ex-Partner: No    Emotionally Abused: No    Physically Abused: No    Sexually Abused: No     FAMILY HISTORY: Family History  Problem Relation Age of Onset   Cancer Mother        liver   Diabetes Mother    Arthritis Mother    Cancer Father        prostate,METS, stomach    Ovarian cancer Sister    Throat cancer Brother      ALLERGIES:   is allergic to plaquenil [hydroxychloroquine sulfate], amoxicillin , azithromycin , lactose intolerance (gi), penicillin g, and hydroxychloroquine sulfate.   MEDICATIONS:  Current Outpatient Medications  Medication Sig Dispense Refill   amLODipine  (NORVASC ) 10 MG tablet Take 10 mg by mouth daily.     folic acid (FOLVITE) 1 MG tablet Take 1 mg by mouth once a week.     hydrocortisone 2.5 % cream Apply 1 Application topically daily as needed (rash).     losartan -hydrochlorothiazide  (HYZAAR) 100-25 MG tablet Take 1 tablet by mouth daily. 30 tablet 0   methotrexate (RHEUMATREX) 2.5 MG tablet Take 15 mg by mouth once a week.     Multiple Vitamin (MULTIVITAMIN WITH MINERALS) TABS tablet Take 1 tablet by mouth daily.     nystatin  (MYCOSTATIN ) 100000 UNIT/ML suspension Take 5 mLs (500,000 Units total) by mouth 4 (four) times daily. 60 mL 0   potassium chloride  SA (KLOR-CON  M) 20 MEQ tablet Take 1 tablet (20 mEq total) by mouth daily for 10 days. 10 tablet 0   Probiotic Product (PROBIOTIC PO) Take 1 capsule by mouth daily.     tacrolimus (PROTOPIC) 0.1 % ointment Apply 1 Application topically 2 (two) times daily.     thiamine  (VITAMIN B-1) 50 MG tablet Take 50 mg by mouth daily.     vitamin B-12 (CYANOCOBALAMIN ) 1000 MCG tablet Take 3,000 mcg by mouth  daily.     No current facility-administered medications for this visit.    REVIEW OF SYSTEMS:    10 Point review of Systems was done is negative except as noted above.   PHYSICAL EXAMINATION: .BP (!) 170/97 (BP Location: Right Arm, Patient Position: Sitting) Comment: the second bp was 177/91 L/arm  Pulse 71   Temp 98.8 F (37.1 C) (Temporal)   Resp 18   Wt 172 lb 9.6 oz (78.3 kg)   SpO2 98%   BMI 30.57 kg/m   GENERAL:alert, in no acute distress  and comfortable SKIN: no acute rashes, no significant lesions EYES: conjunctiva are pink and non-injected, sclera anicteric OROPHARYNX: MMM, no exudates, no oropharyngeal erythema or ulceration NECK: supple, no JVD LYMPH:  no palpable lymphadenopathy in the cervical, axillary or inguinal regions LUNGS: clear to auscultation b/l with normal respiratory effort HEART: regular rate & rhythm ABDOMEN:  normoactive bowel sounds , non tender, not distended. Extremity: no pedal edema PSYCH: alert & oriented x 3 with fluent speech NEURO: no focal motor/sensory deficits   LABORATORY DATA:  I have reviewed the data as listed     Latest Ref Rng & Units 08/02/2023    1:26 PM 07/05/2023    5:25 AM 07/04/2023    5:46 AM  CBC  WBC 4.0 - 10.5 K/uL 3.1  2.8  2.2   Hemoglobin 12.0 - 15.0 g/dL 89.5  89.1  88.8   Hematocrit 36.0 - 46.0 % 31.6  33.1  34.7   Platelets 150 - 400 K/uL 326  59  76   ANC 800     Latest Ref Rng & Units 08/02/2023    1:26 PM 07/05/2023    5:25 AM 07/04/2023    5:46 AM  CMP  Glucose 70 - 99 mg/dL 98  864  846   BUN 8 - 23 mg/dL 8  27  26    Creatinine 0.44 - 1.00 mg/dL 9.21  9.25  9.24   Sodium 135 - 145 mmol/L 144  139  139   Potassium 3.5 - 5.1 mmol/L 3.6  3.4  3.1   Chloride 98 - 111 mmol/L 110  109  108   CO2 22 - 32 mmol/L 30  24  22    Calcium  8.9 - 10.3 mg/dL 8.9  8.5  8.5   Total Protein 6.5 - 8.1 g/dL 6.3     Total Bilirubin 0.0 - 1.2 mg/dL 0.4     Alkaline Phos 38 - 126 U/L 75     AST 15 - 41 U/L 14      ALT 0 - 44 U/L 7      .I have reviewed the above documentation for accuracy and completeness, and I agree with the above.  RADIOGRAPHIC STUDIES: I have personally reviewed the radiological images as listed and agreed with the findings in the report. CUP PACEART REMOTE DEVICE CHECK Result Date: 07/27/2023 ILR summary report received. Battery status OK. Normal device function. No new symptom, tachy, brady, or pause episodes. No new AF episodes. Monthly summary reports and ROV/PRN Presenting rhythm ST ~130s. C/w Average V rate trends. Sending to triage for notification. AB, CVRS  ECHOCARDIOGRAM COMPLETE Result Date: 07/04/2023    ECHOCARDIOGRAM REPORT   Patient Name:   Elaine SLEDD Date of Exam: 07/04/2023 Medical Rec #:  [id number]           Height:       63.0 in Accession #:    [id number]          Weight:       175.3 lb Date of Birth:  20-Sep-1955           BSA:          1.828 m Patient Age:    67 years            BP:           146/84 mmHg Patient Gender: F                   HR:  68 bpm. Exam Location:  Inpatient Procedure: 2D Echo, Color Doppler and Cardiac Doppler (Both Spectral and Color            Flow Doppler were utilized during procedure). Indications:    Syncope  History:        Patient has prior history of Echocardiogram examinations, most                 recent 05/15/2013. Risk Factors:Hypertension.  Sonographer:    Benard Stallion Referring Phys: [id number] POKHREL IMPRESSIONS  1. Left ventricular ejection fraction, by estimation, is 60 to 65%. The left ventricle has normal function. The left ventricle has no regional wall motion abnormalities. Left ventricular diastolic parameters are consistent with Grade I diastolic dysfunction (impaired relaxation).  2. Right ventricular systolic function is normal. The right ventricular size is normal. There is normal pulmonary artery systolic pressure. The estimated right ventricular systolic pressure is 34.1 mmHg.  3. Left atrial size  was mildly dilated.  4. The mitral valve is grossly normal. Mild mitral valve regurgitation. No evidence of mitral stenosis.  5. The aortic valve is tricuspid. Aortic valve regurgitation is not visualized. Aortic valve sclerosis is present, with no evidence of aortic valve stenosis.  6. The inferior vena cava is normal in size with greater than 50% respiratory variability, suggesting right atrial pressure of 3 mmHg. FINDINGS  Left Ventricle: Left ventricular ejection fraction, by estimation, is 60 to 65%. The left ventricle has normal function. The left ventricle has no regional wall motion abnormalities. The left ventricular internal cavity size was normal in size. There is  no left ventricular hypertrophy. Left ventricular diastolic parameters are consistent with Grade I diastolic dysfunction (impaired relaxation). Right Ventricle: The right ventricular size is normal. No increase in right ventricular wall thickness. Right ventricular systolic function is normal. There is normal pulmonary artery systolic pressure. The tricuspid regurgitant velocity is 2.79 m/s, and  with an assumed right atrial pressure of 3 mmHg, the estimated right ventricular systolic pressure is 34.1 mmHg. Left Atrium: Left atrial size was mildly dilated. Right Atrium: Right atrial size was normal in size. Pericardium: There is no evidence of pericardial effusion. Mitral Valve: The mitral valve is grossly normal. Mild mitral valve regurgitation. No evidence of mitral valve stenosis. Tricuspid Valve: The tricuspid valve is grossly normal. Tricuspid valve regurgitation is mild . No evidence of tricuspid stenosis. Aortic Valve: The aortic valve is tricuspid. Aortic valve regurgitation is not visualized. Aortic valve sclerosis is present, with no evidence of aortic valve stenosis. Aortic valve mean gradient measures 7.7 mmHg. Aortic valve peak gradient measures 14.4 mmHg. Aortic valve area, by VTI measures 2.26 cm. Pulmonic Valve: The pulmonic  valve was grossly normal. Pulmonic valve regurgitation is not visualized. No evidence of pulmonic stenosis. Aorta: The aortic root and ascending aorta are structurally normal, with no evidence of dilitation. Venous: The inferior vena cava is normal in size with greater than 50% respiratory variability, suggesting right atrial pressure of 3 mmHg. IAS/Shunts: The atrial septum is grossly normal.  LEFT VENTRICLE PLAX 2D LVIDd:         3.40 cm   Diastology LVIDs:         2.30 cm   LV e' medial:    5.77 cm/s LV PW:         1.00 cm   LV E/e' medial:  13.6 LV IVS:        1.00 cm   LV e' lateral:   10.10 cm/s  LVOT diam:     2.10 cm   LV E/e' lateral: 7.8 LV SV:         88 LV SV Index:   48 LVOT Area:     3.46 cm  RIGHT VENTRICLE RV Basal diam:  3.40 cm RV Mid diam:    2.70 cm RV S prime:     13.90 cm/s TAPSE (M-mode): 2.6 cm LEFT ATRIUM             Index        RIGHT ATRIUM           Index LA diam:        4.00 cm 2.19 cm/m   RA Area:     18.80 cm LA Vol (A2C):   71.6 ml 39.17 ml/m  RA Volume:   48.30 ml  26.42 ml/m LA Vol (A4C):   61.7 ml 33.75 ml/m LA Biplane Vol: 69.8 ml 38.18 ml/m  AORTIC VALVE AV Area (Vmax):    2.06 cm AV Area (Vmean):   1.99 cm AV Area (VTI):     2.26 cm AV Vmax:           189.67 cm/s AV Vmean:          127.333 cm/s AV VTI:            0.388 m AV Peak Grad:      14.4 mmHg AV Mean Grad:      7.7 mmHg LVOT Vmax:         113.00 cm/s LVOT Vmean:        73.300 cm/s LVOT VTI:          0.253 m LVOT/AV VTI ratio: 0.65  AORTA Ao Root diam: 3.10 cm Ao Asc diam:  3.10 cm MITRAL VALVE               TRICUSPID VALVE MV Area (PHT): 2.99 cm    TR Peak grad:   31.1 mmHg MV Decel Time: 254 msec    TR Vmax:        279.00 cm/s MR Peak grad: 130.4 mmHg MR Vmax:      571.00 cm/s  SHUNTS MV E velocity: 78.40 cm/s  Systemic VTI:  0.25 m MV A velocity: 87.80 cm/s  Systemic Diam: 2.10 cm MV E/A ratio:  0.89 Darryle Decent MD Electronically signed by Darryle Decent MD Signature Date/Time: 07/04/2023/11:13:34 AM    Final      ASSESSMENT & PLAN:   Kai Railsback is a 68 y.o. female with:  1.  H/o Relapsing ITP due to lupus -Likely related to Immune thrombocytopenia in the setting of lupus. Patient was treated for her relapsed ITP with 4 doses of Rituxan  again.  2. Lupus -being followed by Dr. Dolphus and now is transferring care to [facility]  3. H/o AVN -  related to steroids s/p left THA.  4. Lupus anticoagulant positive-history of old stroke but that was related to vertebral artery dissection. -Reasonable to consider baby aspirin  81 mg p.o. daily.  Will defer this to her primary care physician.  5.  Leukopenia/neutropenia -likely autoimmune related to lupus.   Plan:  -Discussed lab results from today, 08/02/2023, in detail with patient - We discussed her hospitalization for severe epiglottitis with suspected sepsis in the context of leukopenia in June. - Her counts today show resolving leukopenia with minimal neutropenia with an ANC of 1.2k, mild anemia with a hemoglobin of 10.4 and normal platelets of 326.k.  Her platelets  have dropped to 59k in the context of her previous infection and hospitalization but have now normalized. - No indication to initiate additional treatment for her ITP at this time. - Her cytopenias were likely to be related to her methotrexate and Plaquenil use.  She is currently off of these with improving cytopenias.  She notes her glucose symptoms are relatively controlled at this time and that she has a follow-up with her rheumatologist within the next month to have longer-term plan for management of her lupus. - We shall keep her maintenance Rituxan  appointments as per schedule.  FOLLOW UP: Phone visit with Dr Onesimo in 2 months  Labs 1-2 days prior to phone visit Plz schedule C3 of Maintenance Rituxan  per integrated scheduling  The total time spent in the appointment was 30 minutes* .  All of the patient's questions were answered with apparent satisfaction.  The patient knows to call the clinic with any problems, questions or concerns.   Emaline Onesimo MD MS AAHIVMS [facility] Hematology/Oncology Physician [facility]  .*Total Encounter Time as defined by the Centers for Medicare and Medicaid Services includes, in addition to the face-to-face time of a patient visit (documented in the note above) non-face-to-face time: obtaining and reviewing outside history, ordering and reviewing medications, tests or procedures, care coordination (communications with other health care professionals or caregivers) and documentation in the medical record.    I,Mitra Faeizi,acting as a Neurosurgeon for Emaline Onesimo, MD.,have documented all relevant documentation on the behalf of Emaline Onesimo, MD,as directed by  Emaline Onesimo, MD while in the presence of Emaline Onesimo, MD.  .I have reviewed the above documentation for accuracy and completeness, and I agree with the above. .Gautam Kishore Kale MD

## 2023-08-02 ENCOUNTER — Inpatient Hospital Stay: Attending: Hematology

## 2023-08-02 ENCOUNTER — Inpatient Hospital Stay (HOSPITAL_BASED_OUTPATIENT_CLINIC_OR_DEPARTMENT_OTHER): Admitting: Hematology

## 2023-08-02 VITALS — BP 170/97 | HR 71 | Temp 98.8°F | Resp 18 | Wt 172.6 lb

## 2023-08-02 DIAGNOSIS — D693 Immune thrombocytopenic purpura: Secondary | ICD-10-CM | POA: Diagnosis present

## 2023-08-02 LAB — CBC WITH DIFFERENTIAL (CANCER CENTER ONLY)
Abs Immature Granulocytes: 0.06 K/uL (ref 0.00–0.07)
Basophils Absolute: 0 K/uL (ref 0.0–0.1)
Basophils Relative: 1 %
Eosinophils Absolute: 0.1 K/uL (ref 0.0–0.5)
Eosinophils Relative: 3 %
HCT: 31.6 % — ABNORMAL LOW (ref 36.0–46.0)
Hemoglobin: 10.4 g/dL — ABNORMAL LOW (ref 12.0–15.0)
Immature Granulocytes: 2 %
Lymphocytes Relative: 31 %
Lymphs Abs: 1 K/uL (ref 0.7–4.0)
MCH: 30 pg (ref 26.0–34.0)
MCHC: 32.9 g/dL (ref 30.0–36.0)
MCV: 91.1 fL (ref 80.0–100.0)
Monocytes Absolute: 0.8 K/uL (ref 0.1–1.0)
Monocytes Relative: 24 %
Neutro Abs: 1.2 K/uL — ABNORMAL LOW (ref 1.7–7.7)
Neutrophils Relative %: 39 %
Platelet Count: 326 K/uL (ref 150–400)
RBC: 3.47 MIL/uL — ABNORMAL LOW (ref 3.87–5.11)
RDW: 15.7 % — ABNORMAL HIGH (ref 11.5–15.5)
WBC Count: 3.1 K/uL — ABNORMAL LOW (ref 4.0–10.5)
nRBC: 0.6 % — ABNORMAL HIGH (ref 0.0–0.2)

## 2023-08-02 LAB — CMP (CANCER CENTER ONLY)
ALT: 7 U/L (ref 0–44)
AST: 14 U/L — ABNORMAL LOW (ref 15–41)
Albumin: 3.6 g/dL (ref 3.5–5.0)
Alkaline Phosphatase: 75 U/L (ref 38–126)
Anion gap: 4 — ABNORMAL LOW (ref 5–15)
BUN: 8 mg/dL (ref 8–23)
CO2: 30 mmol/L (ref 22–32)
Calcium: 8.9 mg/dL (ref 8.9–10.3)
Chloride: 110 mmol/L (ref 98–111)
Creatinine: 0.78 mg/dL (ref 0.44–1.00)
GFR, Estimated: >60 mL/min (ref >60)
Glucose, Bld: 98 mg/dL (ref 70–99)
Potassium: 3.6 mmol/L (ref 3.5–5.1)
Sodium: 144 mmol/L (ref 135–145)
Total Bilirubin: 0.4 mg/dL (ref 0.0–1.2)
Total Protein: 6.3 g/dL — ABNORMAL LOW (ref 6.5–8.1)

## 2023-08-03 ENCOUNTER — Encounter

## 2023-08-06 ENCOUNTER — Telehealth: Payer: Self-pay | Admitting: Cardiovascular Disease

## 2023-08-06 NOTE — Telephone Encounter (Signed)
 Patient c/o Palpitations:  STAT if patient reporting lightheadedness, shortness of breath, or chest pain  How long have you had palpitations/irregular HR/ Afib? Are you having the symptoms now? Palpitations off and on this week   Are you currently experiencing lightheadedness, SOB or CP? Nothing currently   Do you have a history of afib (atrial fibrillation) or irregular heart rhythm? No   Have you checked your BP or HR? (document readings if available): 136/78, unable to record hr   Are you experiencing any other symptoms? Lightheadedness and sob this week. Denies any symptoms currently.

## 2023-08-06 NOTE — Telephone Encounter (Addendum)
 Symptom activator transmission received in [facility] and reviewed  3 episodes of tachycardia noted c/w SVT with Max V. Rates of 200 and longest episode 1 min and 41 sec  Called patient back to assess for further symptoms  Patient stated they feel better now but also said they were nervous because they had been having quite a few of those episodes, but that this one was the worst and the closest she's come to passing out  This RN highly encouraged the patient to have a friend or family member drive them to the ED to get checked out since these episodes seem to be increasing in frequency and intensity based on the patient's statements  Patient refused and stated they will stay home for now and call 911 if the symptoms happen again over the weekend  Patient instructed to push her symptom button each time she has these episodes  Patient instructed to sit down on the ground, if she can, if she is standing up and these symptoms arise again to avoid hitting her head in the event that she loses consciousness  Patient verbalized understanding of all instructions provided to her by this RN  No rate control medications noted in patient's active med list  All questions and concerns addressed at this time   Patient appreciative of phone call

## 2023-08-06 NOTE — Telephone Encounter (Signed)
 Spoke with pt who reports she had an episode of palpitations about 10 minutes ago and felt dizziy and saw spots.  Pt states episode has since passed.  Pt advised to send ILR transmission for review.  Reveiwed ED precautions.  Pt verbalizes understanding and agrees with current plan. Will forward to device and Dr Nancey for review.

## 2023-08-08 ENCOUNTER — Encounter: Payer: Self-pay | Admitting: Hematology

## 2023-08-09 NOTE — Telephone Encounter (Signed)
 Patient will need to be seen in clinic with Dr. Nancey or EP APP to discuss further.

## 2023-08-10 NOTE — Telephone Encounter (Signed)
 Appointment 8/6 with Dr Nancey

## 2023-08-11 ENCOUNTER — Ambulatory Visit: Attending: Cardiovascular Disease | Admitting: Cardiovascular Disease

## 2023-08-11 ENCOUNTER — Encounter: Payer: Self-pay | Admitting: Cardiovascular Disease

## 2023-08-11 VITALS — BP 112/70 | HR 89 | Ht 63.0 in | Wt 162.0 lb

## 2023-08-11 DIAGNOSIS — Z01812 Encounter for preprocedural laboratory examination: Secondary | ICD-10-CM | POA: Diagnosis not present

## 2023-08-11 DIAGNOSIS — I4892 Unspecified atrial flutter: Secondary | ICD-10-CM | POA: Diagnosis not present

## 2023-08-11 NOTE — Patient Instructions (Signed)
 Medication Instructions:  Your physician recommends that you continue on your current medications as directed. Please refer to the Current Medication list given to you today.  *If you need a refill on your cardiac medications before your next appointment, please call your pharmacy*  Lab Work: CBC and BMET - please have pre-procedure lab work completed on Thursday, 09/23/23 . This can be done at ANY [facility] near you - no appointment required and this does not have to be fasting. If you have labs (blood work) drawn today and your tests are completely normal, you will receive your results only by: MyChart Message (if you have MyChart) OR A paper copy in the mail If you have any lab test that is abnormal or we need to change your treatment, we will call you to review the results.  Testing/Procedures: Atrial Flutter Ablation - scheduled on Thursday, 10/07/2023. We will be in contact closer to your ablation date with further instructions Your physician has recommended that you have an ablation. Catheter ablation is a medical procedure used to treat some cardiac arrhythmias (irregular heartbeats). During catheter ablation, a long, thin, flexible tube is put into a blood vessel in your groin (upper thigh), or neck. This tube is called an ablation catheter. It is then guided to your heart through the blood vessel. Radio frequency waves destroy small areas of heart tissue where abnormal heartbeats may cause an arrhythmia to start. Please see the instruction sheet given to you today.  Follow-Up: At [facility], you and your health needs are our priority.  As part of our continuing mission to provide you with exceptional heart care, our providers are all part of one team.  This team includes your primary Cardiologist (physician) and Advanced Practice Providers or APPs (Physician Assistants and Nurse Practitioners) who all work together to provide you with the care you need, when you need it.  Your  next appointment:   We will schedule follow up after your ablation  Provider:   You may see Eulas FORBES Furbish, MD or one of the following Advanced Practice Providers on your designated Care Team:   Charlies Arthur, [place] Ozell Jodie Passey, PA-C Suzann Riddle, NP Daphne Barrack, NP   Cardiac Ablation Cardiac ablation is a procedure to destroy, or ablate, a small amount of heart tissue that is causing problems. The heart has many electrical connections. Sometimes, these connections are abnormal and can cause the heart to beat very fast or irregularly. Ablating the abnormal areas can improve the heart's rhythm or return it to normal. Ablation may be done for people who: Have irregular or rapid heartbeats (arrhythmias). Have Wolff-Parkinson-White syndrome. Have taken medicines for an arrhythmia that did not work or caused side effects. Have a high-risk heartbeat that may be life-threatening. Tell a health care provider about: Any allergies you have. All medicines you are taking, including vitamins, herbs, eye drops, creams, and over-the-counter medicines. Any problems you or family members have had with anesthesia. Any bleeding problems you have. Any surgeries you have had. Any medical conditions you have. Whether you are pregnant or may be pregnant. What are the risks? Your health care provider will talk with you about risks. These may include: Infection. Bruising and bleeding. Stroke or blood clots. Damage to nearby structures or organs. Allergic reaction to medicines or dyes. Needing a pacemaker if the heart gets damaged. A pacemaker is a device that helps the heart beat normally. Failure of the procedure. A repeat procedure may be needed. What happens before  the procedure? Medicines Ask your health care provider about: Changing or stopping your regular medicines. These include any heart rhythm medicines, diabetes medicines, or blood thinners you take. Taking medicines such as  aspirin  and ibuprofen . These medicines can thin your blood. Do not take them unless your health care provider tells you to. Taking over-the-counter medicines, vitamins, herbs, and supplements. General instructions Follow instructions from your health care provider about what you may eat and drink. If you will be going home right after the procedure, plan to have a responsible adult: Take you home from the hospital or clinic. You will not be allowed to drive. Care for you for the time you are told. Ask your health care provider what steps will be taken to prevent infection. What happens during the procedure?  An IV will be inserted into one of your veins. You may be given: A sedative. This helps you relax. Anesthesia. This will: Numb certain areas of your body. An incision will be made in your neck or your groin. A needle will be inserted through the incision and into a large vein in your neck or groin. The small, thin tube (catheter) will be inserted through the needle and moved to your heart. A type of X-ray (fluoroscopy) will be used to help guide the catheter and provide images of the heart on a monitor. Dye may be injected through the catheter to help your surgeon see the area of the heart that needs treatment. Electrical currents will be sent from the catheter to destroy heart tissue in certain areas. There are three types of energy that may be used to do this: Heat (radiofrequency energy). Laser energy. Extreme cold (cryoablation). When the tissue has been destroyed, the catheter will be removed. Pressure will be held on the insertion area to prevent bleeding. A bandage (dressing) will be placed over the insertion area. The procedure may vary among health care providers and hospitals. What happens after the procedure? Your blood pressure, heart rate and rhythm, breathing rate, and blood oxygen level will be monitored until you leave the hospital or clinic. Your insertion area will  be checked for bleeding. You will need to lie still for a few hours. If your groin was used, you will need to keep your leg straight for a few hours after the catheter is removed. This information is not intended to replace advice given to you by your health care provider. Make sure you discuss any questions you have with your health care provider. Document Revised: 06/10/2021 Document Reviewed: 06/10/2021 Elsevier Patient Education  2024 [employer].

## 2023-08-11 NOTE — Progress Notes (Signed)
 Electrophysiology Office Note:    Date:  08/11/2023   ID:  Elaine Allen, DOB September 24, 1955, MRN [id number]  PCP:  [name], NP   [facility] Providers Cardiologist:  None Electrophysiologist:  Eulas FORBES Furbish, MD     Referring MD: [name], NP   History of Present Illness:    Elaine Allen is a 68 y.o. female with a medical history significant for stroke hypertension, lupus, immune thrombocytopenia, prediabetes, referred for possible loop implant.     She had a right basilar ganglia lacunar infarct in 2020.  She was hospitalized in [place] with acute weakness and numbness on the left side.  I reviewed notes from that admission.  CT redemonstrated chronic findings of prior CVA with no new infarct or hemorrhage.  During the admission she was noted to have atrial flutter with controlled heart rate and discharged on metoprolol succinate.  She was also discharged on Plavix , atorvastatin and antihypertensive medications.      Today, she reports that she is at baseline -- she has some left-sided foot numbness and left hand paresthesias and weakness.  She has never experienced palpitations or any other heart problem for that matter.  EKGs/Labs/Other Studies Reviewed Today:     Echocardiogram:  [facility] TTE September 2024 EF 65-70 %.  No   Monitors:   Head/Brain Cts and MRI reviewed in Care Everywhere  EKG:   EKG Interpretation Date/Time:  Wednesday August 11 2023 11:05:20 EDT Ventricular Rate:  89 PR Interval:  134 QRS Duration:  82 QT Interval:  358 QTC Calculation: 435 R Axis:   44  Text Interpretation: Normal sinus rhythm Nonspecific ST and T wave abnormality When compared with ECG of 30-Jun-2023 19:33, No significant change was found Confirmed by Furbish Eulas [phone]) on 08/11/2023 11:30:54 AM     Physical Exam:    VS:  BP 112/70 (BP Location: Right Arm, Patient Position: Sitting, Cuff Size: Large)   Pulse 89   Ht 5' 3 (1.6 m)   Wt  162 lb (73.5 kg)   SpO2 96%   BMI 28.70 kg/m     Wt Readings from Last 3 Encounters:  08/11/23 162 lb (73.5 kg)  08/02/23 172 lb 9.6 oz (78.3 kg)  07/05/23 176 lb 5.9 oz (80 kg)     GEN: Well nourished, well developed in no acute distress CARDIAC: RRR, no murmurs, rubs, gallops RESPIRATORY:  Normal work of breathing MUSCULOSKELETAL: no edema    ASSESSMENT & PLAN:     TIA/stroke Will monitor for recurrent arrhythmia as potential cause The most recent event, in 09/2022, was attributed to reactivation of old stroke rather than new potential cardiac embolism  SVT, Atrial flutter Reported in records from stroke admission September 2024 Loop recorder events with tachycardia episode on August 1, 200 bpm Will plan for EP study.  We discussed the indication, rationale, logistics, anticipated benefits, and potential risks of the ablation procedure including but not limited to -- bleed at the groin access site, chest pain, damage to nearby organs such as the diaphragm, lungs, or esophagus, need for a drainage tube, or prolonged hospitalization. I explained that the risk for stroke, heart attack, need for open chest surgery, or even death is very low but not zero. she  expressed understanding and wishes to proceed.   Autoimmune disorders With lupus and immune thrombocytopenia, would want a definitive indication of arrhythmia as a potential cause of cardioembolism to start anticoagulation I will not plan to place her on anticoagulation  before the procedure.  Her arrhythmia episodes are very brief.  We will limit ourselves to a right sided ablation due to concerns for taking anticoagulation with her thrombocytopenia.   Signed, Eulas FORBES Furbish, MD  08/11/2023 11:43 AM    [facility]

## 2023-08-20 NOTE — Addendum Note (Signed)
 Addended by: VICCI SELLER A on: 08/20/2023 01:59 PM   Modules accepted: Orders

## 2023-08-20 NOTE — Progress Notes (Signed)
 [facility] Summary Report / Loop Recorder

## 2023-08-26 ENCOUNTER — Ambulatory Visit (INDEPENDENT_AMBULATORY_CARE_PROVIDER_SITE_OTHER): Payer: Self-pay

## 2023-08-26 DIAGNOSIS — I4892 Unspecified atrial flutter: Secondary | ICD-10-CM | POA: Diagnosis not present

## 2023-08-27 LAB — CUP PACEART REMOTE DEVICE CHECK
Date Time Interrogation Session: 20250821174212
Implantable Pulse Generator Implant Date: 20250303

## 2023-09-01 ENCOUNTER — Ambulatory Visit: Payer: Self-pay | Admitting: Cardiovascular Disease

## 2023-09-07 ENCOUNTER — Encounter

## 2023-09-09 ENCOUNTER — Telehealth (HOSPITAL_COMMUNITY): Payer: Self-pay

## 2023-09-09 NOTE — Telephone Encounter (Signed)
 Spoke with patient to complete pre-procedure call.     Health status review:  Any new medical conditions, recent signs of acute illness or been started on antibiotics? No Any recent hospitalizations or surgeries? No Any new medications started since pre-op visit? No  Follow all medication instructions prior to procedure or the procedure may be rescheduled:    Essential chronic medications:  No medication should be continued, unless told otherwise. On the morning of your procedure do not take any medications.  Nothing to eat or drink after midnight prior to your procedure.  Pre-procedure testing scheduled: lab work by September 18.  Confirmed patient is scheduled for Atrial Flutter Ablation on Thursday, October 2 with Dr. Eulas Furbish. Instructed patient to arrive at the Main Entrance A at [facility]: [address] [place], [place] [address] and check in at Admitting at 5:30 AM.  Advised of plan to go home the same day and will only stay overnight if medically necessary. You MUST have a responsible adult to drive you home and MUST be with you the first 24 hours after you arrive home or your procedure could be cancelled.  Informed patient a nurse will call a day before the procedure to confirm arrival time and ensure instructions are followed.  Patient verbalized understanding to information provided and is agreeable to proceed with procedure.   Advised patient to contact RN Navigator at [phone], to inform of any new medications started after call or concerns prior to procedure.

## 2023-09-21 ENCOUNTER — Other Ambulatory Visit: Payer: Self-pay | Admitting: Student

## 2023-09-21 ENCOUNTER — Other Ambulatory Visit

## 2023-09-21 DIAGNOSIS — M545 Low back pain, unspecified: Secondary | ICD-10-CM

## 2023-09-27 ENCOUNTER — Ambulatory Visit (INDEPENDENT_AMBULATORY_CARE_PROVIDER_SITE_OTHER): Payer: Self-pay

## 2023-09-27 DIAGNOSIS — I4892 Unspecified atrial flutter: Secondary | ICD-10-CM

## 2023-09-27 LAB — CUP PACEART REMOTE DEVICE CHECK
Date Time Interrogation Session: 20250921233951
Implantable Pulse Generator Implant Date: 20250303

## 2023-09-28 NOTE — Progress Notes (Signed)
 Remote Loop Recorder Transmission

## 2023-09-30 ENCOUNTER — Encounter: Payer: Self-pay | Admitting: Student

## 2023-10-01 ENCOUNTER — Other Ambulatory Visit: Payer: Self-pay

## 2023-10-01 DIAGNOSIS — D693 Immune thrombocytopenic purpura: Secondary | ICD-10-CM

## 2023-10-04 ENCOUNTER — Inpatient Hospital Stay: Attending: Hematology

## 2023-10-04 DIAGNOSIS — D693 Immune thrombocytopenic purpura: Secondary | ICD-10-CM | POA: Diagnosis present

## 2023-10-04 LAB — CBC WITH DIFFERENTIAL (CANCER CENTER ONLY)
Abs Immature Granulocytes: 0.01 K/uL (ref 0.00–0.07)
Basophils Absolute: 0 K/uL (ref 0.0–0.1)
Basophils Relative: 1 %
Eosinophils Absolute: 0 K/uL (ref 0.0–0.5)
Eosinophils Relative: 1 %
HCT: 36.3 % (ref 36.0–46.0)
Hemoglobin: 12 g/dL (ref 12.0–15.0)
Immature Granulocytes: 1 %
Lymphocytes Relative: 39 %
Lymphs Abs: 0.9 K/uL (ref 0.7–4.0)
MCH: 30.2 pg (ref 26.0–34.0)
MCHC: 33.1 g/dL (ref 30.0–36.0)
MCV: 91.4 fL (ref 80.0–100.0)
Monocytes Absolute: 0.4 K/uL (ref 0.1–1.0)
Monocytes Relative: 18 %
Neutro Abs: 0.8 K/uL — ABNORMAL LOW (ref 1.7–7.7)
Neutrophils Relative %: 40 %
Platelet Count: 175 K/uL (ref 150–400)
RBC: 3.97 MIL/uL (ref 3.87–5.11)
RDW: 14.7 % (ref 11.5–15.5)
WBC Count: 2.1 K/uL — ABNORMAL LOW (ref 4.0–10.5)
nRBC: 0 % (ref 0.0–0.2)

## 2023-10-04 LAB — CMP (CANCER CENTER ONLY)
ALT: 13 U/L (ref 0–44)
AST: 20 U/L (ref 15–41)
Albumin: 4.2 g/dL (ref 3.5–5.0)
Alkaline Phosphatase: 105 U/L (ref 38–126)
Anion gap: 5 (ref 5–15)
BUN: 12 mg/dL (ref 8–23)
CO2: 30 mmol/L (ref 22–32)
Calcium: 9.1 mg/dL (ref 8.9–10.3)
Chloride: 104 mmol/L (ref 98–111)
Creatinine: 0.85 mg/dL (ref 0.44–1.00)
GFR, Estimated: >60 mL/min (ref >60)
Glucose, Bld: 142 mg/dL — ABNORMAL HIGH (ref 70–99)
Potassium: 3.7 mmol/L (ref 3.5–5.1)
Sodium: 139 mmol/L (ref 135–145)
Total Bilirubin: 0.6 mg/dL (ref 0.0–1.2)
Total Protein: 6.7 g/dL (ref 6.5–8.1)

## 2023-10-05 ENCOUNTER — Ambulatory Visit
Admission: RE | Admit: 2023-10-05 | Discharge: 2023-10-05 | Disposition: A | Discharge status: Home / Self Care | Source: Ambulatory Visit | Attending: Student | Admitting: Student

## 2023-10-05 DIAGNOSIS — M545 Low back pain, unspecified: Secondary | ICD-10-CM

## 2023-10-06 ENCOUNTER — Inpatient Hospital Stay: Attending: Hematology | Admitting: Hematology

## 2023-10-06 DIAGNOSIS — M329 Systemic lupus erythematosus, unspecified: Secondary | ICD-10-CM | POA: Diagnosis not present

## 2023-10-06 DIAGNOSIS — Z8673 Personal history of transient ischemic attack (TIA), and cerebral infarction without residual deficits: Secondary | ICD-10-CM | POA: Insufficient documentation

## 2023-10-06 DIAGNOSIS — Z79899 Other long term (current) drug therapy: Secondary | ICD-10-CM | POA: Insufficient documentation

## 2023-10-06 DIAGNOSIS — D693 Immune thrombocytopenic purpura: Secondary | ICD-10-CM | POA: Insufficient documentation

## 2023-10-06 DIAGNOSIS — Z808 Family history of malignant neoplasm of other organs or systems: Secondary | ICD-10-CM | POA: Diagnosis not present

## 2023-10-06 DIAGNOSIS — D709 Neutropenia, unspecified: Secondary | ICD-10-CM | POA: Insufficient documentation

## 2023-10-06 DIAGNOSIS — Z79621 Long term (current) use of calcineurin inhibitor: Secondary | ICD-10-CM | POA: Insufficient documentation

## 2023-10-06 DIAGNOSIS — Z8041 Family history of malignant neoplasm of ovary: Secondary | ICD-10-CM | POA: Insufficient documentation

## 2023-10-06 NOTE — Progress Notes (Signed)
 HEMATOLOGY/ONCOLOGY CLINIC VISIT NOTE  Date of Service: 08/02/2023  Patient Care Team: [name], NP as PCP - General Mealor, Eulas BRAVO, MD as PCP - Electrophysiology (Cardiology) Onesimo Emaline Brink, MD as Consulting Physician (Hematology)  REFERRING PHYSICIAN: [name], NP  CHIEF COMPLAINTS/PURPOSE OF CONSULTATION:  Follow-up for continued evaluation and management of ITP related to SLE  INTERVAL HISTORY  I connected with Elaine Allen on 10/06/2022 at  8:40 AM EDT by telephone visit and verified that I am speaking with the correct person using two identifiers.   I discussed the limitations, risks, security and privacy concerns of performing an evaluation and management service by telemedicine and the availability of in-person appointments. I also discussed with the patient that there may be a patient responsible charge related to this service. The patient expressed understanding and agreed to proceed.   Other persons participating in the visit and their role in the encounter: Medical scribes: Dashear Hill and Damien Blanks   Patient's location: Home   Provider's location: [facility]   Chief Complaint: continued evaluation and management of ITP related to SLE.      Elaine Allen was last seen by me on 08/02/2023 for follow-up for continued evaluation and management of ITP related to SLE; at the time she reported she did not have any concerns and was doing well.    Today, she reports that she feels well overall and has no new concerns.   Patient denies fevers, chills, night sweats or SOB.   She makes note of occasional headaches, anxiety and sleeping issues.  CT scan was ordered 10/05/2023.  She reports the CT was scheduled due to intermittent pain on her right side of abdomen a-- notd to have no acute findings and 5mm non obstructing calculus in the rt kidney.  She states that she continues using topical rash cream, Hydrocortisone 2.5% cream and Tacrolimus (Protopic)  0.1% ointment for lupus.  She states she is up to date with her vaccinations.   She plans to continue f/u with Rheumatologist Dr.Riffe  All of the patients questions were answered in detail.  MEDICAL HISTORY:  Past Medical History:  Diagnosis Date   Arthritis    Blind right eye    sees colors   Chronic anemia    Detached retina, right    corrected good for about 6 months sees peripheral colors only   History of kidney stones    Hypertension    Hyperthyroidism    Lupus    Pneumonia    Retinal detachment    Spasmodic dysphonia    Stroke (HCC) 2015   no permanant defecits had weakness in left arm in hospital Had PT also   Vertigo    Hx of preceding stroke     SURGICAL HISTORY: Past Surgical History:  Procedure Laterality Date   ABDOMINAL HYSTERECTOMY     BREAST EXCISIONAL BIOPSY Left    KNEE SURGERY Left    THYROID  SURGERY     TOTAL HIP ARTHROPLASTY Left 06/27/2018   Procedure: TOTAL HIP ARTHROPLASTY ANTERIOR APPROACH;  Surgeon: Liam Lerner, MD;  Location: [facility];  Service: Orthopedics;  Laterality: Left;     SOCIAL HISTORY: Social History   Socioeconomic History   Marital status: Single    Spouse name: Not on file   Number of children: 2   Years of education: college   Highest education level: Not on file  Occupational History   Occupation: options  Tobacco Use   Smoking status: Never  Passive exposure: Never   Smokeless tobacco: Never  Vaping Use   Vaping status: Never Used  Substance and Sexual Activity   Alcohol use: No   Drug use: No   Sexual activity: Not Currently  Other Topics Concern   Not on file  Social History Narrative   Not on file   Social Drivers of Health   Financial Resource Strain: Low Risk  (08/13/2020)   Overall Financial Resource Strain (CARDIA)    Difficulty of Paying Living Expenses: Not hard at all  Food Insecurity: No Food Insecurity (06/30/2023)   Hunger Vital Sign    Worried About Running Out of Food in the Last  Year: Never true    Ran Out of Food in the Last Year: Never true  Transportation Needs: No Transportation Needs (06/30/2023)   PRAPARE - Administrator, Civil Service (Medical): No    Lack of Transportation (Non-Medical): No  Physical Activity: Sufficiently Active (08/13/2020)   Exercise Vital Sign    Days of Exercise per Week: 5 days    Minutes of Exercise per Session: 30 min  Stress: No Stress Concern Present (08/13/2020)   [employer] of Occupational Health - Occupational Stress Questionnaire    Feeling of Stress : Not at all  Social Connections: Moderately Integrated (06/30/2023)   Social Connection and Isolation Panel    Frequency of Communication with Friends and Family: More than three times a week    Frequency of Social Gatherings with Friends and Family: More than three times a week    Attends Religious Services: 1 to 4 times per year    Active Member of [employer] or Organizations: No    Attends Banker Meetings: 1 to 4 times per year    Marital Status: Never married  Intimate Partner Violence: Not At Risk (06/30/2023)   Humiliation, Afraid, Rape, and Kick questionnaire    Fear of Current or Ex-Partner: No    Emotionally Abused: No    Physically Abused: No    Sexually Abused: No     FAMILY HISTORY: Family History  Problem Relation Age of Onset   Cancer Mother        liver   Diabetes Mother    Arthritis Mother    Cancer Father        prostate,METS, stomach    Ovarian cancer Sister    Throat cancer Brother      ALLERGIES:   is allergic to plaquenil [hydroxychloroquine sulfate], amoxicillin , azithromycin , lactose intolerance (gi), penicillin g, and hydroxychloroquine sulfate.   MEDICATIONS:  Current Outpatient Medications  Medication Sig Dispense Refill   folic acid (FOLVITE) 1 MG tablet Take 50 mg by mouth daily.     hydrocortisone 2.5 % cream Apply 1 Application topically daily as needed (rash). (Patient not taking: Reported on 10/05/2023)      losartan -hydrochlorothiazide  (HYZAAR) 100-25 MG tablet Take 1 tablet by mouth daily. 30 tablet 0   metoprolol succinate (TOPROL-XL) 50 MG 24 hr tablet Take 50 mg by mouth every morning.     potassium chloride  SA (KLOR-CON  M) 20 MEQ tablet Take 1 tablet (20 mEq total) by mouth daily for 10 days. (Patient not taking: Reported on 10/05/2023) 10 tablet 0   tacrolimus (PROTOPIC) 0.1 % ointment Apply 1 Application topically 2 (two) times daily. (Patient not taking: Reported on 10/05/2023)     thiamine  (VITAMIN B-1) 50 MG tablet Take 25 mg by mouth daily.     No current facility-administered medications for this visit.  REVIEW OF SYSTEMS:    10 Point review of Systems was done is negative except as noted above.   PHYSICAL EXAMINATION: Telemedicine visit  LABORATORY DATA:  I have reviewed the data as listed     Latest Ref Rng & Units 10/04/2023   12:07 PM 08/02/2023    1:26 PM 07/05/2023    5:25 AM  CBC  WBC 4.0 - 10.5 K/uL 2.1  3.1  2.8   Hemoglobin 12.0 - 15.0 g/dL 87.9  89.5  89.1   Hematocrit 36.0 - 46.0 % 36.3  31.6  33.1   Platelets 150 - 400 K/uL 175  326  59   ANC 800     Latest Ref Rng & Units 10/04/2023   12:07 PM 08/02/2023    1:26 PM 07/05/2023    5:25 AM  CMP  Glucose 70 - 99 mg/dL 857  98  864   BUN 8 - 23 mg/dL 12  8  27    Creatinine 0.44 - 1.00 mg/dL 9.14  9.21  9.25   Sodium 135 - 145 mmol/L 139  144  139   Potassium 3.5 - 5.1 mmol/L 3.7  3.6  3.4   Chloride 98 - 111 mmol/L 104  110  109   CO2 22 - 32 mmol/L 30  30  24    Calcium  8.9 - 10.3 mg/dL 9.1  8.9  8.5   Total Protein 6.5 - 8.1 g/dL 6.7  6.3    Total Bilirubin 0.0 - 1.2 mg/dL 0.6  0.4    Alkaline Phos 38 - 126 U/L 105  75    AST 15 - 41 U/L 20  14    ALT 0 - 44 U/L 13  7     .I have reviewed the above documentation for accuracy and completeness, and I agree with the above.  RADIOGRAPHIC STUDIES: I have personally reviewed the radiological images as listed and agreed with the findings in the  report. CUP PACEART REMOTE DEVICE CHECK Result Date: 09/27/2023 ILR summary report received. Battery status OK. Normal device function. No new symptom, tachy, brady, or pause episodes. No new AF episodes. Presenting EGM c/w ST at 115 bpm with PVC. Monthly summary reports and ROV/PRN. MC, CVRS  ASSESSMENT & PLAN:   Elaine Allen is a 68 y.o. female with:  1.  H/o Relapsing ITP due to lupus -Likely related to Immune thrombocytopenia in the setting of lupus. Patient was treated for her relapsed ITP with 4 doses of Rituxan  again.  2. Lupus -being followed by Dr.Riffe  3. H/o AVN -  related to steroids s/p left THA.  4. Lupus anticoagulant positive-history of old stroke but that was related to vertebral artery dissection. -Reasonable to consider baby aspirin  81 mg p.o. daily.  Will defer this to her primary care physician.  5.  Leukopenia/neutropenia -likely autoimmune related to lupus.   Plan:  -Discussed lab results from 10/04/2023, in detail with patient  - Recommend continue f/u with Rheumatologist Dr.Riffe  - HGB is normal 12. WBC was flucuating but being monitored, attributed autoimmune neutropenia.  - next session of 2 doses is NOV 11 scheduled and NOV 25 will be scheduled Rituximab   - Recommend pt to stay up to day with vaccination, Prevnar 20 , shingles, Covid (if not taken within last 6 months), RSV and connect with PCP for recommendation based on health for immune system. Pt states she is up to date with vaccinations.  - We shall keep her maintenance Rituxan  appointments as  per schedule.  FOLLOW UP: Follow-up as per schedule appointments for next dose of Rituxan  with labs and MD visit on 11/16/2023  .The total time spent in the appointment was 20 minutes* .  All of the patient's questions were answered with apparent satisfaction. The patient knows to call the clinic with any problems, questions or concerns.   Emaline Saran MD MS AAHIVMS [facility] Hematology/Oncology  Physician [facility]  .*Total Encounter Time as defined by the Centers for Medicare and Medicaid Services includes, in addition to the face-to-face time of a patient visit (documented in the note above) non-face-to-face time: obtaining and reviewing outside history, ordering and reviewing medications, tests or procedures, care coordination (communications with other health care professionals or caregivers) and documentation in the medical record.   I,Dashear M Hill,acting as a Neurosurgeon for Emaline Saran, MD.,have documented all relevant documentation on the behalf of Emaline Saran, MD,as directed by  Emaline Saran, MD while in the presence of Emaline Saran, MD.    .I have reviewed the above documentation for accuracy and completeness, and I agree with the above. .Karlei Waldo Kishore Tresia Revolorio MD

## 2023-10-06 NOTE — Pre-Procedure Instructions (Signed)
 Attempted to call patient regarding procedure instructions.  Left voicemail on the following items: Arrival time 0515 Nothing to eat or drink after midnight No meds AM of procedure Responsible person to drive you home and stay with you for 24 hrs  Have you missed any doses of anti-coagulant not on anticoagulant per Dr Nancey note.

## 2023-10-07 ENCOUNTER — Ambulatory Visit (HOSPITAL_COMMUNITY): Admitting: Anesthesiology

## 2023-10-07 ENCOUNTER — Ambulatory Visit (HOSPITAL_COMMUNITY)
Admission: RE | Admit: 2023-10-07 | Discharge: 2023-10-07 | Disposition: A | Attending: Cardiovascular Disease | Admitting: Cardiovascular Disease

## 2023-10-07 ENCOUNTER — Other Ambulatory Visit: Payer: Self-pay

## 2023-10-07 ENCOUNTER — Ambulatory Visit (HOSPITAL_COMMUNITY)
Admission: RE | Disposition: A | Discharge status: Home / Self Care | Payer: Self-pay | Source: Home / Self Care | Attending: Cardiovascular Disease

## 2023-10-07 DIAGNOSIS — I10 Essential (primary) hypertension: Secondary | ICD-10-CM

## 2023-10-07 DIAGNOSIS — I4892 Unspecified atrial flutter: Secondary | ICD-10-CM | POA: Diagnosis present

## 2023-10-07 DIAGNOSIS — Z8673 Personal history of transient ischemic attack (TIA), and cerebral infarction without residual deficits: Secondary | ICD-10-CM | POA: Insufficient documentation

## 2023-10-07 DIAGNOSIS — R000 Tachycardia, unspecified: Secondary | ICD-10-CM

## 2023-10-07 DIAGNOSIS — I639 Cerebral infarction, unspecified: Secondary | ICD-10-CM

## 2023-10-07 DIAGNOSIS — D693 Immune thrombocytopenic purpura: Secondary | ICD-10-CM | POA: Insufficient documentation

## 2023-10-07 DIAGNOSIS — R7303 Prediabetes: Secondary | ICD-10-CM | POA: Diagnosis not present

## 2023-10-07 DIAGNOSIS — Z7902 Long term (current) use of antithrombotics/antiplatelets: Secondary | ICD-10-CM | POA: Insufficient documentation

## 2023-10-07 DIAGNOSIS — M329 Systemic lupus erythematosus, unspecified: Secondary | ICD-10-CM | POA: Diagnosis not present

## 2023-10-07 DIAGNOSIS — Z79899 Other long term (current) drug therapy: Secondary | ICD-10-CM | POA: Insufficient documentation

## 2023-10-07 DIAGNOSIS — I471 Supraventricular tachycardia, unspecified: Secondary | ICD-10-CM | POA: Diagnosis not present

## 2023-10-07 HISTORY — PX: ELECTROPHYSIOLOGY STUDY: EP1205

## 2023-10-07 MED ORDER — PROPOFOL 1000 MG/100ML IV EMUL
INTRAVENOUS | Status: AC
  Filled 2023-10-07: qty 200

## 2023-10-07 MED ORDER — HEPARIN (PORCINE) IN NACL 1000-0.9 UT/500ML-% IV SOLN
INTRAVENOUS | Status: DC | PRN
  Administered 2023-10-07 (×2): 500 mL

## 2023-10-07 MED ORDER — LIDOCAINE 2% (20 MG/ML) 5 ML SYRINGE
INTRAMUSCULAR | Status: DC | PRN
  Administered 2023-10-07: 80 mg via INTRAVENOUS

## 2023-10-07 MED ORDER — SODIUM CHLORIDE 0.9% FLUSH: 3.0000 mL | INTRAVENOUS | Status: DC | PRN

## 2023-10-07 MED ORDER — SODIUM CHLORIDE 0.9 % IV SOLN: 250.0000 mL | INTRAVENOUS | Status: DC | PRN

## 2023-10-07 MED ORDER — HEPARIN SODIUM (PORCINE) 1000 UNIT/ML IJ SOLN
INTRAMUSCULAR | Status: DC | PRN
  Administered 2023-10-07: 1000 [IU] via INTRAVENOUS

## 2023-10-07 MED ORDER — ISOPROTERENOL HCL 0.2 MG/ML IJ SOLN
INTRAVENOUS | Status: DC | PRN
  Administered 2023-10-07: 2 ug/min via INTRAVENOUS

## 2023-10-07 MED ORDER — PHENYLEPHRINE 80 MCG/ML (10ML) SYRINGE FOR IV PUSH (FOR BLOOD PRESSURE SUPPORT)
PREFILLED_SYRINGE | INTRAVENOUS | Status: DC | PRN
  Administered 2023-10-07 (×2): 80 ug via INTRAVENOUS

## 2023-10-07 MED ORDER — PHENYLEPHRINE HCL-NACL 20-0.9 MG/250ML-% IV SOLN
INTRAVENOUS | Status: DC | PRN
  Administered 2023-10-07: 30 ug/min via INTRAVENOUS

## 2023-10-07 MED ORDER — FENTANYL CITRATE (PF) 250 MCG/5ML IJ SOLN
INTRAMUSCULAR | Status: DC | PRN
  Administered 2023-10-07: 50 ug via INTRAVENOUS

## 2023-10-07 MED ORDER — ROCURONIUM BROMIDE 10 MG/ML (PF) SYRINGE
PREFILLED_SYRINGE | INTRAVENOUS | Status: DC | PRN
  Administered 2023-10-07: 10 mg via INTRAVENOUS
  Administered 2023-10-07: 50 mg via INTRAVENOUS

## 2023-10-07 MED ORDER — MIDAZOLAM HCL 2 MG/2ML IJ SOLN
INTRAMUSCULAR | Status: DC | PRN
  Administered 2023-10-07: 1 mg via INTRAVENOUS

## 2023-10-07 MED ORDER — FENTANYL CITRATE (PF) 100 MCG/2ML IJ SOLN
INTRAMUSCULAR | Status: AC
  Filled 2023-10-07: qty 2

## 2023-10-07 MED ORDER — ISOPROTERENOL HCL 0.2 MG/ML IJ SOLN
INTRAMUSCULAR | Status: AC
  Filled 2023-10-07: qty 5

## 2023-10-07 MED ORDER — DEXAMETHASONE SODIUM PHOSPHATE 10 MG/ML IJ SOLN
INTRAMUSCULAR | Status: DC | PRN
  Administered 2023-10-07: 5 mg via INTRAVENOUS

## 2023-10-07 MED ORDER — ONDANSETRON HCL 4 MG/2ML IJ SOLN: 4.0000 mg | Freq: Four times a day (QID) | INTRAMUSCULAR | Status: DC | PRN

## 2023-10-07 MED ORDER — MIDAZOLAM HCL 2 MG/2ML IJ SOLN
INTRAMUSCULAR | Status: AC
  Filled 2023-10-07: qty 2

## 2023-10-07 MED ORDER — PROPOFOL 10 MG/ML IV BOLUS
INTRAVENOUS | Status: DC | PRN
  Administered 2023-10-07: 100 mg via INTRAVENOUS

## 2023-10-07 MED ORDER — ACETAMINOPHEN 325 MG PO TABS: 650.0000 mg | ORAL_TABLET | ORAL | Status: DC | PRN

## 2023-10-07 MED ORDER — SODIUM CHLORIDE 0.9 % IV SOLN: INTRAVENOUS | Status: DC

## 2023-10-07 MED ORDER — VASOPRESSIN 20 UNIT/ML IV SOLN
INTRAVENOUS | Status: AC
  Filled 2023-10-07: qty 1

## 2023-10-07 MED ORDER — SUGAMMADEX SODIUM 200 MG/2ML IV SOLN
INTRAVENOUS | Status: DC | PRN
  Administered 2023-10-07: 200 mg via INTRAVENOUS

## 2023-10-07 MED ORDER — PHENYLEPHRINE HCL-NACL 20-0.9 MG/250ML-% IV SOLN
INTRAVENOUS | Status: AC
  Filled 2023-10-07: qty 500

## 2023-10-07 MED ORDER — HEPARIN SODIUM (PORCINE) 1000 UNIT/ML IJ SOLN
INTRAMUSCULAR | Status: AC
  Filled 2023-10-07: qty 10

## 2023-10-07 MED ORDER — ESMOLOL HCL 100 MG/10ML IV SOLN
INTRAVENOUS | Status: DC | PRN
  Administered 2023-10-07: 50 mg via INTRAVENOUS

## 2023-10-07 MED ORDER — ONDANSETRON HCL 4 MG/2ML IJ SOLN
INTRAMUSCULAR | Status: DC | PRN
  Administered 2023-10-07: 4 mg via INTRAVENOUS

## 2023-10-07 NOTE — Anesthesia Procedure Notes (Addendum)
 Procedure Name: Intubation Date/Time: 10/07/2023 7:48 AM  Performed by: Worth Peppers, CRNAPre-anesthesia Checklist: Patient identified, Emergency Drugs available, Suction available and Patient being monitored Patient Re-evaluated:Patient Re-evaluated prior to induction Oxygen Delivery Method: Circle System Utilized Preoxygenation: Pre-oxygenation with 100% oxygen Induction Type: IV induction Ventilation: Mask ventilation without difficulty Laryngoscope Size: Mac and 3 Tube type: Oral Tube size: 7.0 mm Number of attempts: 1 Airway Equipment and Method: Stylet and Oral airway Placement Confirmation: ETT inserted through vocal cords under direct vision, positive ETCO2 and breath sounds checked- equal and bilateral Secured at: 22 cm Tube secured with: Tape Dental Injury: Teeth and Oropharynx as per pre-operative assessment

## 2023-10-07 NOTE — H&P (Signed)
  Electrophysiology Office Note:    Date:  10/07/2023   ID:  Elaine Allen, DOB 1955-12-03, MRN [id number]  PCP:  [name], NP   [facility] Providers Cardiologist:  None Electrophysiologist:  Eulas FORBES Furbish, MD     Referring MD: No ref. provider found   History of Present Illness:    Elaine Allen is a 68 y.o. female with a medical history significant for stroke hypertension, lupus, immune thrombocytopenia, prediabetes, referred for possible loop implant.     She had a right basilar ganglia lacunar infarct in 2020.  She was hospitalized in [place] with acute weakness and numbness on the left side.  I reviewed notes from that admission.  CT redemonstrated chronic findings of prior CVA with no new infarct or hemorrhage.  During the admission she was noted to have atrial flutter with controlled heart rate and discharged on metoprolol succinate.  She was also discharged on Plavix , atorvastatin and antihypertensive medications.      Today, she reports that she is at baseline -- she has some left-sided foot numbness and left hand paresthesias and weakness.  She has never experienced palpitations or any other heart problem for that matter.  She presents today for ablation. Since our last visit, cymbalta was started. She has otherwise had no changes.  EKGs/Labs/Other Studies Reviewed Today:     Echocardiogram:  [facility] TTE September 2024 EF 65-70 %.  No   Monitors:   Head/Brain Cts and MRI reviewed in Care Everywhere  EKG:         Physical Exam:    VS:  BP 124/74   Pulse 66   Temp 98.2 F (36.8 C) (Oral)   Resp 16   Ht 5' 3 (1.6 m)   Wt 78 kg   SpO2 98%   BMI 30.47 kg/m     Wt Readings from Last 3 Encounters:  10/07/23 78 kg  08/11/23 73.5 kg  08/02/23 78.3 kg     GEN: Well nourished, well developed in no acute distress CARDIAC: RRR, no murmurs, rubs, gallops RESPIRATORY:  Normal work of breathing MUSCULOSKELETAL: no  edema    ASSESSMENT & PLAN:     TIA/stroke Will monitor for recurrent arrhythmia as potential cause The most recent event, in 09/2022, was attributed to reactivation of old stroke rather than new potential cardiac embolism  SVT, Atrial flutter Reported in records from stroke admission September 2024 Loop recorder events with tachycardia episode on August 1, 200 bpm Will plan for EP study.  We discussed the indication, rationale, logistics, anticipated benefits, and potential risks of the ablation procedure including but not limited to -- bleed at the groin access site, chest pain, damage to nearby organs such as the diaphragm, lungs, or esophagus, need for a drainage tube, or prolonged hospitalization. I explained that the risk for stroke, heart attack, need for open chest surgery, or even death is very low but not zero. she  expressed understanding and wishes to proceed.   Autoimmune disorders With lupus and immune thrombocytopenia, would want a definitive indication of arrhythmia as a potential cause of cardioembolism to start anticoagulation I will not plan to place her on anticoagulation before the procedure.  Her arrhythmia episodes are very brief.  We will limit ourselves to a right sided ablation due to concerns for taking anticoagulation with her thrombocytopenia.   Signed, Eulas FORBES Furbish, MD  10/07/2023 7:19 AM    [facility]

## 2023-10-07 NOTE — Discharge Instructions (Signed)

## 2023-10-07 NOTE — Transfer of Care (Signed)
 Immediate Anesthesia Transfer of Care Note  Patient: Elaine Allen  Procedure(s) Performed: ELECTROPHYSIOLOGY STUDY  Patient Location: PACU  Anesthesia Type:General  Level of Consciousness: awake, alert , and oriented  Airway & Oxygen Therapy: Patient Spontanous Breathing and Patient connected to face mask oxygen  Post-op Assessment: Report given to RN and Post -op Vital signs reviewed and stable  Post vital signs: Reviewed and stable  Last Vitals:  Vitals Value Taken Time  BP 143/85 10/07/23 08:51  Temp 36.7 C 10/07/23 08:51  Pulse 81 10/07/23 08:53  Resp 19 10/07/23 08:52  SpO2 100 % 10/07/23 08:53  Vitals shown include unfiled device data.  Last Pain:  Vitals:   10/07/23 0851  TempSrc: Oral  PainSc: 0-No pain         Complications: No notable events documented.

## 2023-10-07 NOTE — Anesthesia Postprocedure Evaluation (Signed)
 Anesthesia Post Note  Patient: Elaine Allen  Procedure(s) Performed: ELECTROPHYSIOLOGY STUDY     Patient location during evaluation: PACU Anesthesia Type: General Level of consciousness: awake and alert Pain management: pain level controlled Vital Signs Assessment: post-procedure vital signs reviewed and stable Respiratory status: spontaneous breathing, nonlabored ventilation, respiratory function stable and patient connected to nasal cannula oxygen Cardiovascular status: blood pressure returned to baseline and stable Postop Assessment: no apparent nausea or vomiting Anesthetic complications: no   No notable events documented.  Last Vitals:  Vitals:   10/07/23 0915 10/07/23 0921  BP: 138/85 138/85  Pulse: 66 68  Resp: 15 15  Temp:  36.7 C  SpO2: 100% 100%    Last Pain:  Vitals:   10/07/23 0921  TempSrc: Oral  PainSc: 0-No pain                 Rome Ade

## 2023-10-07 NOTE — Progress Notes (Signed)
Patient ambulated to BR. Right groin remains unremarkable.

## 2023-10-07 NOTE — Anesthesia Preprocedure Evaluation (Signed)
 Anesthesia Evaluation  Patient identified by MRN, date of birth, ID band Patient awake    Reviewed: Allergy & Precautions, NPO status , Patient's Chart, lab work & pertinent test results  History of Anesthesia Complications Negative for: history of anesthetic complications  Airway Mallampati: III  TM Distance: >3 FB Neck ROM: Full   Comment: Large tongue Dental  (+) Edentulous Upper, Edentulous Lower   Pulmonary neg pulmonary ROS, neg sleep apnea, neg COPD, Patient abstained from smoking.Not current smoker   Pulmonary exam normal breath sounds clear to auscultation       Cardiovascular Exercise Tolerance: Good METShypertension, Pt. on medications (-) CAD and (-) Past MI + dysrhythmias Atrial Fibrillation  Rhythm:Irregular Rate:Normal - Systolic murmurs 1. Left ventricular ejection fraction, by estimation, is 60 to 65%. The  left ventricle has normal function. The left ventricle has no regional  wall motion abnormalities. Left ventricular diastolic parameters are  consistent with Grade I diastolic  dysfunction (impaired relaxation).   2. Right ventricular systolic function is normal. The right ventricular  size is normal. There is normal pulmonary artery systolic pressure. The  estimated right ventricular systolic pressure is 34.1 mmHg.   3. Left atrial size was mildly dilated.   4. The mitral valve is grossly normal. Mild mitral valve regurgitation.  No evidence of mitral stenosis.   5. The aortic valve is tricuspid. Aortic valve regurgitation is not  visualized. Aortic valve sclerosis is present, with no evidence of aortic  valve stenosis.   6. The inferior vena cava is normal in size with greater than 50%  respiratory variability, suggesting right atrial pressure of 3 mmHg.     Neuro/Psych Residual left leg numbness CVA, Residual Symptoms  negative psych ROS   GI/Hepatic ,neg GERD  ,,(+)     (-) substance abuse     Endo/Other  neg diabetes    Renal/GU negative Renal ROS     Musculoskeletal   Abdominal   Peds  Hematology   Anesthesia Other Findings Past Medical History: No date: Arthritis No date: Blind right eye     Comment:  sees colors No date: Chronic anemia No date: Detached retina, right     Comment:  corrected good for about 6 months sees peripheral colors              only No date: History of kidney stones No date: Hypertension No date: Hyperthyroidism No date: Lupus No date: Pneumonia No date: Retinal detachment No date: Spasmodic dysphonia 2015: Stroke (HCC)     Comment:  no permanant defecits had weakness in left arm in               hospital Had PT also No date: Vertigo     Comment:  Hx of preceding stroke  Reproductive/Obstetrics                              Anesthesia Physical Anesthesia Plan  ASA: 3  Anesthesia Plan: General   Post-op Pain Management: Minimal or no pain anticipated   Induction: Intravenous  PONV Risk Score and Plan: 3 and Ondansetron , Dexamethasone  and Treatment may vary due to age or medical condition  Airway Management Planned: Oral ETT  Additional Equipment: None  Intra-op Plan:   Post-operative Plan: Extubation in OR  Informed Consent: I have reviewed the patients History and Physical, chart, labs and discussed the procedure including the risks, benefits and alternatives for the proposed anesthesia with  the patient or authorized representative who has indicated his/her understanding and acceptance.     Dental advisory given  Plan Discussed with: CRNA and Surgeon  Anesthesia Plan Comments: (Discussed risks of anesthesia with patient, including PONV, sore throat, lip/dental/eye damage. Rare risks discussed as well, such as cardiorespiratory and neurological sequelae, and allergic reactions. Discussed the role of CRNA in patient's perioperative care. Patient understands.)        Anesthesia Quick  Evaluation

## 2023-10-08 ENCOUNTER — Telehealth (HOSPITAL_COMMUNITY): Payer: Self-pay

## 2023-10-08 ENCOUNTER — Telehealth: Payer: Self-pay | Admitting: Cardiovascular Disease

## 2023-10-08 ENCOUNTER — Encounter (HOSPITAL_COMMUNITY): Payer: Self-pay | Admitting: Cardiovascular Disease

## 2023-10-08 MED FILL — Fentanyl Citrate Preservative Free (PF) Inj 100 MCG/2ML: INTRAMUSCULAR | Qty: 1 | Status: AC

## 2023-10-08 NOTE — Telephone Encounter (Signed)
 Patient's question addressed. Please see post procedure call.

## 2023-10-08 NOTE — Telephone Encounter (Signed)
 Patient would like to know why procedure was not completed yesterday. Please advise.

## 2023-10-08 NOTE — Telephone Encounter (Signed)
 Spoke with patient to complete post procedure follow up call. Patient reports no complications with groin sites.   Instructions reviewed with patient:  Remove large bandage at puncture site after 24 hours. It is normal to have bruising, tenderness, mild swelling, and a pea or marble sized lump/knot at the groin site which can take up to three months to resolve.  Get help right away if you notice sudden swelling at the puncture site.  Check your puncture site every day for signs of infection: fever, redness, swelling, pus drainage, warmth, foul odor or excessive pain. If this occurs, please call [phone], to speak with the RN Navigator. Get help right away if your puncture site is bleeding and the bleeding does not stop after applying firm pressure to the area.  You will follow up with the APP on 11/08/23.    I had a lengthy discussion with patient to address her question inquiring why her ablation was not completed and she did not recall Dr. Nancey talking with her afterwards. Explained to patient after multiple attempts to stimulate her heart, Dr. Nancey was unable to induce any tachycardia or arrhythmia, revealing a normal electrical pathway. Therefore, there were no areas identified to treat. Advised that Dr. Nancey most likely talked with her daughter, if she was still sedated.    Patient then inquired if she will continue to experience dizziness and elevated heart rate when she sits up in the morning or when standing. Advised patient to change positions slowly from sitting or standing up from a lying position, to prevent possible orthostatic hypotension and symptoms.     Patient verbalized understanding to all instructions and explanation provided.

## 2023-10-11 NOTE — Progress Notes (Signed)
 Remote Loop Recorder Transmission

## 2023-10-12 ENCOUNTER — Encounter: Payer: Self-pay | Admitting: Hematology

## 2023-10-12 ENCOUNTER — Ambulatory Visit: Payer: Self-pay | Admitting: Cardiovascular Disease

## 2023-10-12 ENCOUNTER — Encounter

## 2023-10-13 ENCOUNTER — Emergency Department (HOSPITAL_BASED_OUTPATIENT_CLINIC_OR_DEPARTMENT_OTHER)
Admission: EM | Admit: 2023-10-13 | Discharge: 2023-10-13 | Disposition: A | Discharge status: Home / Self Care | Attending: Emergency Medicine | Admitting: Emergency Medicine

## 2023-10-13 ENCOUNTER — Encounter (HOSPITAL_BASED_OUTPATIENT_CLINIC_OR_DEPARTMENT_OTHER): Payer: Self-pay | Admitting: Emergency Medicine

## 2023-10-13 ENCOUNTER — Other Ambulatory Visit: Payer: Self-pay

## 2023-10-13 ENCOUNTER — Emergency Department (HOSPITAL_BASED_OUTPATIENT_CLINIC_OR_DEPARTMENT_OTHER): Admitting: Radiology

## 2023-10-13 ENCOUNTER — Emergency Department (HOSPITAL_BASED_OUTPATIENT_CLINIC_OR_DEPARTMENT_OTHER)

## 2023-10-13 DIAGNOSIS — I700 Atherosclerosis of aorta: Secondary | ICD-10-CM | POA: Diagnosis not present

## 2023-10-13 DIAGNOSIS — Z87442 Personal history of urinary calculi: Secondary | ICD-10-CM | POA: Diagnosis not present

## 2023-10-13 DIAGNOSIS — J90 Pleural effusion, not elsewhere classified: Secondary | ICD-10-CM | POA: Insufficient documentation

## 2023-10-13 DIAGNOSIS — R110 Nausea: Secondary | ICD-10-CM | POA: Diagnosis present

## 2023-10-13 DIAGNOSIS — N200 Calculus of kidney: Secondary | ICD-10-CM | POA: Diagnosis not present

## 2023-10-13 DIAGNOSIS — R609 Edema, unspecified: Secondary | ICD-10-CM | POA: Insufficient documentation

## 2023-10-13 DIAGNOSIS — Z8673 Personal history of transient ischemic attack (TIA), and cerebral infarction without residual deficits: Secondary | ICD-10-CM | POA: Insufficient documentation

## 2023-10-13 DIAGNOSIS — I10 Essential (primary) hypertension: Secondary | ICD-10-CM | POA: Diagnosis not present

## 2023-10-13 DIAGNOSIS — Z79899 Other long term (current) drug therapy: Secondary | ICD-10-CM | POA: Insufficient documentation

## 2023-10-13 LAB — CBC WITH DIFFERENTIAL/PLATELET
Abs Immature Granulocytes: 0.01 K/uL (ref 0.00–0.07)
Basophils Absolute: 0 K/uL (ref 0.0–0.1)
Basophils Relative: 1 %
Eosinophils Absolute: 0.1 K/uL (ref 0.0–0.5)
Eosinophils Relative: 2 %
HCT: 34.2 % — ABNORMAL LOW (ref 36.0–46.0)
Hemoglobin: 11.5 g/dL — ABNORMAL LOW (ref 12.0–15.0)
Immature Granulocytes: 1 %
Lymphocytes Relative: 39 %
Lymphs Abs: 0.8 K/uL (ref 0.7–4.0)
MCH: 30.7 pg (ref 26.0–34.0)
MCHC: 33.6 g/dL (ref 30.0–36.0)
MCV: 91.2 fL (ref 80.0–100.0)
Monocytes Absolute: 0.5 K/uL (ref 0.1–1.0)
Monocytes Relative: 23 %
Neutro Abs: 0.7 K/uL — ABNORMAL LOW (ref 1.7–7.7)
Neutrophils Relative %: 34 %
Platelets: 181 K/uL (ref 150–400)
RBC: 3.75 MIL/uL — ABNORMAL LOW (ref 3.87–5.11)
RDW: 14.3 % (ref 11.5–15.5)
WBC: 2.1 K/uL — ABNORMAL LOW (ref 4.0–10.5)
nRBC: 0 % (ref 0.0–0.2)

## 2023-10-13 LAB — COMPREHENSIVE METABOLIC PANEL WITH GFR
ALT: 10 U/L (ref 0–44)
AST: 18 U/L (ref 15–41)
Albumin: 4.1 g/dL (ref 3.5–5.0)
Alkaline Phosphatase: 108 U/L (ref 38–126)
Anion gap: 13 (ref 5–15)
BUN: 9 mg/dL (ref 8–23)
CO2: 24 mmol/L (ref 22–32)
Calcium: 9.5 mg/dL (ref 8.9–10.3)
Chloride: 106 mmol/L (ref 98–111)
Creatinine, Ser: 0.79 mg/dL (ref 0.44–1.00)
GFR, Estimated: >60 mL/min (ref >60)
Glucose, Bld: 119 mg/dL — ABNORMAL HIGH (ref 70–99)
Potassium: 3.6 mmol/L (ref 3.5–5.1)
Sodium: 142 mmol/L (ref 135–145)
Total Bilirubin: 0.6 mg/dL (ref 0.0–1.2)
Total Protein: 6.6 g/dL (ref 6.5–8.1)

## 2023-10-13 LAB — TROPONIN T, HIGH SENSITIVITY: Troponin T High Sensitivity: <15 ng/L (ref 0–19)

## 2023-10-13 LAB — URINALYSIS, ROUTINE W REFLEX MICROSCOPIC
Bacteria, UA: NONE SEEN
Bilirubin Urine: NEGATIVE
Glucose, UA: NEGATIVE mg/dL
Hgb urine dipstick: NEGATIVE
Ketones, ur: NEGATIVE mg/dL
Nitrite: NEGATIVE
Protein, ur: 30 mg/dL — AB
Specific Gravity, Urine: 1.021 (ref 1.005–1.030)
pH: 7 (ref 5.0–8.0)

## 2023-10-13 LAB — RESP PANEL BY RT-PCR (RSV, FLU A&B, COVID)  RVPGX2
Influenza A by PCR: NEGATIVE
Influenza B by PCR: NEGATIVE
Resp Syncytial Virus by PCR: NEGATIVE
SARS Coronavirus 2 by RT PCR: NEGATIVE

## 2023-10-13 MED ORDER — ONDANSETRON HCL 4 MG/2ML IJ SOLN
4.0000 mg | Freq: Once | INTRAMUSCULAR | Status: AC
  Administered 2023-10-13: 4 mg via INTRAVENOUS
  Filled 2023-10-13: qty 2

## 2023-10-13 MED ORDER — ONDANSETRON 4 MG PO TBDP: 4.0000 mg | ORAL_TABLET | Freq: Three times a day (TID) | ORAL | 0 refills | Status: AC | PRN

## 2023-10-13 NOTE — ED Triage Notes (Signed)
 States had cardiac ablation on 10/2 and has been nauseous since. Endorse intermittent chills and body aches.

## 2023-10-13 NOTE — Discharge Instructions (Signed)
 As we discussed, your workup in the ER today was reassuring for acute findings.  Laboratory evaluation x-ray and CT imaging did not reveal any emergent cause of your symptoms.  You may just still be recovering from the procedure you had a few days ago.  I have given you a prescription for Zofran  which is a nausea medication you can take as prescribed as needed for any residual nausea or vomiting.  Please call your PCP to schedule close follow-up appointment at your earliest convenience.  Return if development of any new or worsening symptoms.

## 2023-10-13 NOTE — ED Provider Notes (Signed)
 [place] EMERGENCY DEPARTMENT AT [facility] Provider Note   CSN: [id number] Arrival date & time: 10/13/23  1030     Patient presents with: Nausea   Elaine Allen is a 68 y.o. female.   Patient with history of kidney stones, hypertension, lupus, stroke presents today with complaints of nausea and feeling generally unwell. Reports that symptoms began after she had a cardiac ablation on 10/2. She reports that she had the procedure for aflutter, however when they did the procedure they did not see any electrical abnormalities and therefore an ablation was not performed. Reports some persistent soreness in her groin, however no significant pain. The are has not been draining. States that since then she has had nausea without vomiting or diarrhea. Denies abdominal pain.  Does report that she has felt similarly in the past and been diagnosed with a kidney stone.  She denies any flank pain.  No urinary symptoms.  Denies chest pain, shortness of breath, cough, congestion, fevers, or chills.  The history is provided by the patient. No language interpreter was used.       Prior to Admission medications   Medication Sig Start Date End Date Taking? Authorizing Provider  folic acid (FOLVITE) 1 MG tablet Take 50 mg by mouth daily.    [provider]  hydrocortisone 2.5 % cream Apply 1 Application topically daily as needed (rash). Patient not taking: Reported on 10/05/2023 06/24/23   [provider]  losartan -hydrochlorothiazide  (HYZAAR) 100-25 MG tablet Take 1 tablet by mouth daily. 06/23/21   Rollene Almarie LABOR, MD  metoprolol succinate (TOPROL-XL) 50 MG 24 hr tablet Take 50 mg by mouth every morning. 07/12/23   [provider]  potassium chloride  SA (KLOR-CON  M) 20 MEQ tablet Take 1 tablet (20 mEq total) by mouth daily for 10 days. 07/05/23 10/07/23  Pokhrel, Laxman, MD  tacrolimus (PROTOPIC) 0.1 % ointment Apply 1 Application topically 2 (two) times daily.  12/01/22   [provider]  thiamine  (VITAMIN B-1) 50 MG tablet Take 25 mg by mouth daily. 06/16/23   [provider]    Allergies: Plaquenil [hydroxychloroquine sulfate], Amoxicillin , Azithromycin , Lactose intolerance (gi), Penicillin g, and Hydroxychloroquine sulfate    Review of Systems  Gastrointestinal:  Positive for nausea.  All other systems reviewed and are negative.   Updated Vital Signs BP (!) 164/95   Pulse 81   Temp 98.8 F (37.1 C)   Resp 16   SpO2 99%   Physical Exam Vitals and nursing note reviewed.  Constitutional:      General: She is not in acute distress.    Appearance: Normal appearance. She is normal weight. She is not ill-appearing, toxic-appearing or diaphoretic.  HENT:     Head: Normocephalic and atraumatic.  Cardiovascular:     Rate and Rhythm: Normal rate and regular rhythm.     Heart sounds: Normal heart sounds.  Pulmonary:     Effort: Pulmonary effort is normal. No respiratory distress.     Breath sounds: Normal breath sounds.  Abdominal:     General: Abdomen is flat.     Palpations: Abdomen is soft.     Tenderness: There is no abdominal tenderness.     Comments: Right groin with mild tenderness to palpation and small hematoma. No wounds, crepitus, fluctuance, or induration, erythema, or warmth  Musculoskeletal:        General: Normal range of motion.     Cervical back: Normal range of motion and neck supple.  Skin:  General: Skin is warm and dry.  Neurological:     General: No focal deficit present.     Mental Status: She is alert.  Psychiatric:        Mood and Affect: Mood normal.        Behavior: Behavior normal.     (all labs ordered are listed, but only abnormal results are displayed) Labs Reviewed  CBC WITH DIFFERENTIAL/PLATELET - Abnormal; Notable for the following components:      Result Value   WBC 2.1 (*)    RBC 3.75 (*)    Hemoglobin 11.5 (*)    HCT 34.2 (*)    Neutro Abs 0.7 (*)    All other  components within normal limits  COMPREHENSIVE METABOLIC PANEL WITH GFR - Abnormal; Notable for the following components:   Glucose, Bld 119 (*)    All other components within normal limits  URINALYSIS, ROUTINE W REFLEX MICROSCOPIC - Abnormal; Notable for the following components:   Protein, ur 30 (*)    Leukocytes,Ua MODERATE (*)    All other components within normal limits  RESP PANEL BY RT-PCR (RSV, FLU A&B, COVID)  RVPGX2  TROPONIN T, HIGH SENSITIVITY    EKG: EKG Interpretation Date/Time:  Wednesday October 13 2023 10:56:02 EDT Ventricular Rate:  82 PR Interval:  139 QRS Duration:  87 QT Interval:  379 QTC Calculation: 443 R Axis:   63  Text Interpretation: Sinus rhythm No significant change since last tracing Confirmed by Towana Sharper [phone]) on 10/13/2023 10:59:45 AM  Radiology: DG Chest 2 View Result Date: 10/13/2023 CLINICAL DATA:  Nausea. EXAM: CHEST - 2 VIEW COMPARISON:  06/30/2023. FINDINGS: The heart size and mediastinal contours are within normal limits. No focal consolidation, pleural effusion, or pneumothorax. No acute osseous abnormality. IMPRESSION: No acute cardiopulmonary findings. Electronically Signed   By: Harrietta Sherry M.D.   On: 10/13/2023 11:53   CT Renal Stone Study Result Date: 10/13/2023 CLINICAL DATA:  Nausea since cardiac ablation on October 2nd. Intermittent chills and body aches. Abdominal/flank pain. EXAM: CT ABDOMEN AND PELVIS WITHOUT CONTRAST TECHNIQUE: Multidetector CT imaging of the abdomen and pelvis was performed following the standard protocol without IV contrast. RADIATION DOSE REDUCTION: This exam was performed according to the departmental dose-optimization program which includes automated exposure control, adjustment of the mA and/or kV according to patient size and/or use of iterative reconstruction technique. COMPARISON:  10/05/2023. FINDINGS: Lower chest: Lung bases are clear. Heart is at the upper limits of normal in size. No  pericardial effusion. Trace bilateral pleural effusions. Distal esophagus is grossly unremarkable. Hepatobiliary: Liver and gallbladder are unremarkable. No biliary ductal dilatation. Pancreas: Negative. Spleen: Negative. Adrenals/Urinary Tract: Small right renal stone. Probable renal sinus cysts bilaterally. No specific follow-up necessary. Kidneys are otherwise unremarkable. Ureters are decompressed. Bladder is somewhat low in volume and fairly obscured by streak artifact from a left hip arthroplasty. Stomach/Bowel: Stomach, small bowel, appendix and colon are unremarkable. Vascular/Lymphatic: Atherosclerotic calcification of the aorta. No pathologically enlarged lymph nodes. Reproductive: Hysterectomy.  No adnexal mass. Other: Mild fluid and edema in the right groin, likely related to vascular access for cardiac ablation 10/07/2023. No free fluid. Mesenteries and peritoneum are unremarkable. Musculoskeletal: Left hip arthroplasty. Osteopenia. Degenerative changes in the spine. IMPRESSION: 1. No acute findings to explain the patient's clinical history. 2. Trace bilateral pleural effusions. 3. Small right renal stone. 4. Mild edema and fluid in the right groin, likely related to vascular access for cardiac ablation 10/07/2023. 5.  Aortic atherosclerosis (ICD10-I70.0).  Electronically Signed   By: Newell Eke M.D.   On: 10/13/2023 11:48     Procedures   Medications Ordered in the ED - No data to display                                  Medical Decision Making Amount and/or Complexity of Data Reviewed Labs: ordered. Radiology: ordered.  Risk Prescription drug management.   This patient is a 68 y.o. female who presents to the ED for concern of nausea, this involves an extensive number of treatment options, and is a complaint that carries with it a high risk of complications and morbidity. The emergent differential diagnosis prior to evaluation includes, but is not limited to,  ACS/MI, DKA,  elevated ICP, Ischemic bowel, Sepsis, drug-related, Appendicitis, Bowel obstruction, Electrolyte abnormalities, Pancreatitis, Biliary colic, Gastroenteritis, Gastroparesis, Hepatitis, Migraine, Thyroid  disease, Renal colic, PUD, UTI   This is not an exhaustive differential.   Past Medical History / Co-morbidities / Social History:  has a past medical history of Arthritis, Blind right eye, Chronic anemia, Detached retina, right, History of kidney stones, Hypertension, Hyperthyroidism, Lupus, Pneumonia, Retinal detachment, Spasmodic dysphonia, Stroke (HCC) (2015), and Vertigo.  Additional history: Chart reviewed. Pertinent results include: patient seen on 10/2, had cardiac, however no ablation was performed as no electrical abnormalities were noted.  Physical Exam: Physical exam performed. The pertinent findings include: Well-appearing, abdomen soft and nontender, right groin with mild tenderness to palpation and small hematoma. No wounds, crepitus, fluctuance, or induration, erythema, or warmth  No other acute physical exam abnormalities  Lab Tests: I ordered, and personally interpreted labs.  The pertinent results include:  WBC 2.1, hgb 11.5 (consistent with previous).  No other acute laboratory abnormalities.   Imaging Studies: I ordered imaging studies including CXR, CT renal. I independently visualized and interpreted imaging which showed   CXR: NAD  CT:  1. No acute findings to explain the patient's clinical history. 2. Trace bilateral pleural effusions. 3. Small right renal stone. 4. Mild edema and fluid in the right groin, likely related to vascular access for cardiac ablation 10/07/2023. 5.  Aortic atherosclerosis  I agree with the radiologist interpretation.   Cardiac Monitoring:  The patient was maintained on a cardiac monitor.  My attending physician Dr. Towana viewed and interpreted the cardiac monitored which showed an underlying rhythm of: sinus rhythm, no STEMI. I  agree with this interpretation.   Medications: I ordered medication including zofran   for nausea. Reevaluation of the patient after these medicines showed that the patient improved. I have reviewed the patients home medicines and have made adjustments as needed.  Disposition: After consideration of the diagnostic results and the patients response to treatment, I feel that emergency department workup does not suggest an emergent condition requiring admission or immediate intervention beyond what has been performed at this time. The plan is: Discharge with Zofran , close outpatient follow-up and return precautions.  Patient's workup is benign, patient potentially still recovering from her procedure.  No signs or symptoms to suggest ACS/PE.  Her vascular access site is not infectious appearing. Evaluation and diagnostic testing in the emergency department does not suggest an emergent condition requiring admission or immediate intervention beyond what has been performed at this time.  Plan for discharge with close PCP follow-up.  Patient is understanding and amenable with plan, educated on red flag symptoms that would prompt immediate return.  Patient discharged in stable  condition.   I discussed this case with my attending physician Dr. Towana who cosigned this note including patient's presenting symptoms, physical exam, and planned diagnostics and interventions. Attending physician stated agreement with plan or made changes to plan which were implemented.    Final diagnoses:  Nausea    ED Discharge Orders          Ordered    ondansetron  (ZOFRAN -ODT) 4 MG disintegrating tablet  Every 8 hours PRN        10/13/23 1540          An After Visit Summary was printed and given to the patient.      Elaine Allen 10/13/23 1541    Towana Ozell BROCKS, MD 10/13/23 [id number]

## 2023-10-29 ENCOUNTER — Ambulatory Visit: Attending: Cardiovascular Disease

## 2023-10-29 DIAGNOSIS — I4892 Unspecified atrial flutter: Secondary | ICD-10-CM

## 2023-10-30 LAB — CUP PACEART REMOTE DEVICE CHECK
Date Time Interrogation Session: 20251023233929
Implantable Pulse Generator Implant Date: 20250303

## 2023-11-02 NOTE — Progress Notes (Signed)
 Remote Loop Recorder Transmission

## 2023-11-07 NOTE — Progress Notes (Signed)
 This encounter was created in error - please disregard.

## 2023-11-08 ENCOUNTER — Ambulatory Visit: Attending: Cardiology | Admitting: Pulmonary Disease

## 2023-11-08 DIAGNOSIS — I4892 Unspecified atrial flutter: Secondary | ICD-10-CM

## 2023-11-10 ENCOUNTER — Ambulatory Visit: Payer: Self-pay | Admitting: Cardiovascular Disease

## 2023-11-12 ENCOUNTER — Other Ambulatory Visit: Payer: Self-pay

## 2023-11-12 DIAGNOSIS — D693 Immune thrombocytopenic purpura: Secondary | ICD-10-CM

## 2023-11-16 ENCOUNTER — Inpatient Hospital Stay

## 2023-11-16 ENCOUNTER — Other Ambulatory Visit

## 2023-11-16 ENCOUNTER — Encounter

## 2023-11-16 ENCOUNTER — Ambulatory Visit: Admitting: Hematology

## 2023-11-16 ENCOUNTER — Inpatient Hospital Stay: Admitting: Hematology

## 2023-11-26 ENCOUNTER — Other Ambulatory Visit

## 2023-11-26 ENCOUNTER — Ambulatory Visit: Admitting: Hematology

## 2023-11-26 ENCOUNTER — Ambulatory Visit

## 2023-11-29 ENCOUNTER — Ambulatory Visit

## 2023-11-29 DIAGNOSIS — I4892 Unspecified atrial flutter: Secondary | ICD-10-CM

## 2023-11-30 ENCOUNTER — Inpatient Hospital Stay: Attending: Hematology

## 2023-11-30 ENCOUNTER — Inpatient Hospital Stay

## 2023-11-30 ENCOUNTER — Inpatient Hospital Stay: Admitting: Hematology

## 2023-11-30 VITALS — BP 159/94 | HR 100 | Temp 98.1°F | Resp 18 | Wt 173.0 lb

## 2023-11-30 VITALS — BP 150/89 | HR 79 | Temp 98.2°F | Resp 17

## 2023-11-30 DIAGNOSIS — D693 Immune thrombocytopenic purpura: Secondary | ICD-10-CM | POA: Diagnosis not present

## 2023-11-30 DIAGNOSIS — Z5112 Encounter for antineoplastic immunotherapy: Secondary | ICD-10-CM | POA: Insufficient documentation

## 2023-11-30 LAB — CMP (CANCER CENTER ONLY)
ALT: 15 U/L (ref 0–44)
AST: 27 U/L (ref 15–41)
Albumin: 4.3 g/dL (ref 3.5–5.0)
Alkaline Phosphatase: 109 U/L (ref 38–126)
Anion gap: 10 (ref 5–15)
BUN: 11 mg/dL (ref 8–23)
CO2: 24 mmol/L (ref 22–32)
Calcium: 9.4 mg/dL (ref 8.9–10.3)
Chloride: 108 mmol/L (ref 98–111)
Creatinine: 0.81 mg/dL (ref 0.44–1.00)
GFR, Estimated: >60 mL/min (ref >60)
Glucose, Bld: 103 mg/dL — ABNORMAL HIGH (ref 70–99)
Potassium: 3.5 mmol/L (ref 3.5–5.1)
Sodium: 142 mmol/L (ref 135–145)
Total Bilirubin: 0.7 mg/dL (ref 0.0–1.2)
Total Protein: 6.9 g/dL (ref 6.5–8.1)

## 2023-11-30 LAB — CBC WITH DIFFERENTIAL (CANCER CENTER ONLY)
Abs Immature Granulocytes: 0 K/uL (ref 0.00–0.07)
Basophils Absolute: 0 K/uL (ref 0.0–0.1)
Basophils Relative: 0 %
Eosinophils Absolute: 0.1 K/uL (ref 0.0–0.5)
Eosinophils Relative: 2 %
HCT: 38.9 % (ref 36.0–46.0)
Hemoglobin: 13.1 g/dL (ref 12.0–15.0)
Immature Granulocytes: 0 %
Lymphocytes Relative: 32 %
Lymphs Abs: 0.8 K/uL (ref 0.7–4.0)
MCH: 29.6 pg (ref 26.0–34.0)
MCHC: 33.7 g/dL (ref 30.0–36.0)
MCV: 88 fL (ref 80.0–100.0)
Monocytes Absolute: 0.5 K/uL (ref 0.1–1.0)
Monocytes Relative: 22 %
Neutro Abs: 1.1 K/uL — ABNORMAL LOW (ref 1.7–7.7)
Neutrophils Relative %: 44 %
Platelet Count: 163 K/uL (ref 150–400)
RBC: 4.42 MIL/uL (ref 3.87–5.11)
RDW: 14.1 % (ref 11.5–15.5)
WBC Count: 2.4 K/uL — ABNORMAL LOW (ref 4.0–10.5)
nRBC: 0 % (ref 0.0–0.2)

## 2023-11-30 LAB — CUP PACEART REMOTE DEVICE CHECK
Date Time Interrogation Session: 20251124002251
Implantable Pulse Generator Implant Date: 20250303

## 2023-11-30 MED ORDER — DIPHENHYDRAMINE HCL 25 MG PO CAPS
50.0000 mg | ORAL_CAPSULE | Freq: Once | ORAL | Status: AC
  Administered 2023-11-30: 50 mg via ORAL
  Filled 2023-11-30: qty 2

## 2023-11-30 MED ORDER — SODIUM CHLORIDE 0.9 % IV SOLN
1000.0000 mg | Freq: Once | INTRAVENOUS | Status: AC
  Administered 2023-11-30: 1000 mg via INTRAVENOUS
  Filled 2023-11-30: qty 100

## 2023-11-30 MED ORDER — SODIUM CHLORIDE 0.9 % IV SOLN: INTRAVENOUS | Status: DC

## 2023-11-30 MED ORDER — MONTELUKAST SODIUM 10 MG PO TABS
10.0000 mg | ORAL_TABLET | Freq: Once | ORAL | Status: AC
  Administered 2023-11-30: 10 mg via ORAL
  Filled 2023-11-30: qty 1

## 2023-11-30 MED ORDER — ACETAMINOPHEN 325 MG PO TABS
650.0000 mg | ORAL_TABLET | Freq: Once | ORAL | Status: AC
  Administered 2023-11-30: 650 mg via ORAL
  Filled 2023-11-30: qty 2

## 2023-11-30 MED ORDER — FAMOTIDINE IN NACL 20-0.9 MG/50ML-% IV SOLN
20.0000 mg | Freq: Once | INTRAVENOUS | Status: AC
  Administered 2023-11-30: 20 mg via INTRAVENOUS
  Filled 2023-11-30: qty 50

## 2023-11-30 MED ORDER — METHYLPREDNISOLONE SODIUM SUCC 125 MG IJ SOLR
125.0000 mg | Freq: Once | INTRAMUSCULAR | Status: AC
  Administered 2023-11-30: 125 mg via INTRAVENOUS
  Filled 2023-11-30: qty 2

## 2023-11-30 NOTE — Progress Notes (Signed)
 HEMATOLOGY ONCOLOGY PROGRESS NOTE  Date of service: 11/30/2023  Patient Care Team: [name], NP as PCP - General Mealor, Eulas BRAVO, MD as PCP - Electrophysiology (Cardiology) Onesimo Emaline Brink, MD as Consulting Physician (Hematology)  CHIEF COMPLAINT/PURPOSE OF CONSULTATION: Follow-up for continued evaluation and management of ITP related to SLE.  HISTORY OF PRESENTING ILLNESS:  (03/09/2023) Elaine Allen is a wonderful 68 y.o. female who has been referred to us  by Rollene Almarie LABOR, * for evaluation and management of Thrombocytopenia. The pt reports that she is doing well overall.    The pt reports she was diagnosed with Lupus in 2004. She was on Plaquenil for 2 weeks at beginning but had really bad rash. In 2018 she has had pain and edema all over and OTC medications did not helped.  She also had her liver checked and there was concern over low blood counts. Due to low platelet counts in 2018 she was put on 20mg  of predinsone which helped her symptoms. Recently pt has been taking 5mg  of predinsone and 50mg  of Imuran  every other day.    Pt reports she has been having body aches, fatigue and dizzy spells. She is currently taking blood pressure medication. The dizzy spells usually are onset by standing and last for one to two minutes. She has had atrial fibrillation but has not seen cardiologist.  Pt has had healthy appetite and hydration. She has also had recent nose bleeds that have lasted up to 8 hours.    Most recent lab results (03/08/2019) of CBC is as follows: all values are WNL except for WBC at 3.7, Platelet at 30, Neutrophil at 40, MONO at 12.7   Past lab results (09/06/2019) of CBC is as follows: all values are WNL except for Hemoglobin at 10.5, HCT at 32, Platelets at 62. (09/06/2019) of Hepatic function Panel: all values are WNL except: Alkaline Phosphatase at 143 (09/06/2019) POCT erythrocyte sed rate, Non-automated is WNL (09/06/2019) of Basic Metabolic Panel  is WNL   On review of systems, pt reports SOB, joint edema, nose bleeds, fatigue, dizziness and denies hematuria, blood in stool, rashes and any other symptoms.    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    INTERVAL HISTORY:  Elaine Allen is a 68 y.o. female who is here today for continued evaluation and management of ITP related to SLE.   she was last seen by me on 10/06/2023; at the time she did not have any concerns and was doing well.   Today, she report no new concerns. Her lupus has caused some skin irritation from the sun on the left side of her head. Her Rheumatologist Dr. Mai referred her to the Dermatologist. She sees Dr. Mai every three months.  She is experiencing some fatigue and joint stiffness that is improved with Rituxan .   She reports she is up to date on her vaccines.   She denies any new infection issues, fevers/chills, drenching night sweats, chest pain, new lumps/bumps, leg swelling, diarrhea, SOB, or change in breathing. No bleeding issues.   REVIEW OF SYSTEMS:   10 Point review of systems of done and is negative except as noted above.  MEDICAL HISTORY Past Medical History:  Diagnosis Date   Arthritis    Blind right eye    sees colors   Chronic anemia  Detached retina, right    corrected good for about 6 months sees peripheral colors only   History of kidney stones    Hypertension    Hyperthyroidism    Lupus    Pneumonia    Retinal detachment    Spasmodic dysphonia    Stroke (HCC) 2015   no permanant defecits had weakness in left arm in hospital Had PT also   Vertigo    Hx of preceding stroke    SURGICAL HISTORY Past Surgical History:  Procedure Laterality Date   ABDOMINAL HYSTERECTOMY     BREAST EXCISIONAL BIOPSY Left    ELECTROPHYSIOLOGY STUDY N/A 10/07/2023   Procedure: ELECTROPHYSIOLOGY STUDY;  Surgeon: Nancey Eulas BRAVO, MD;  Location: [facility];  Service: Cardiovascular;  Laterality: N/A;   KNEE SURGERY Left    THYROID  SURGERY     TOTAL HIP ARTHROPLASTY Left 06/27/2018   Procedure: TOTAL HIP ARTHROPLASTY ANTERIOR APPROACH;  Surgeon: Liam Lerner, MD;  Location: [facility];  Service: Orthopedics;  Laterality: Left;    SOCIAL HISTORY Social History   Tobacco Use   Smoking status: Never    Passive exposure: Never   Smokeless tobacco: Never  Vaping Use   Vaping status: Never Used  Substance Use Topics   Alcohol use: No   Drug use: No    Social History   Social History Narrative   Not on file    SOCIAL DRIVERS OF HEALTH SDOH Screenings   Food Insecurity: No Food Insecurity (06/30/2023)  Housing: Low Risk  (06/30/2023)  Transportation Needs: No Transportation Needs (06/30/2023)  Utilities: Not At Risk (06/30/2023)  Alcohol Screen: Low Risk  (08/13/2020)  Depression (PHQ2-9): Low Risk  (08/13/2020)  Financial Resource Strain: Low Risk  (08/13/2020)  Physical Activity: Sufficiently Active (08/13/2020)  Social Connections: Moderately Integrated (06/30/2023)  Stress: No Stress Concern Present (08/13/2020)  Tobacco Use: Low Risk  (10/13/2023)     FAMILY HISTORY Family History  Problem Relation Age of Onset   Cancer Mother        liver   Diabetes Mother    Arthritis Mother    Cancer Father        prostate,METS, stomach    Ovarian cancer Sister    Throat cancer Brother      ALLERGIES: is allergic to plaquenil [hydroxychloroquine sulfate], amoxicillin , azithromycin , lactose intolerance (gi), penicillin g, and hydroxychloroquine sulfate.  MEDICATIONS  Current Outpatient Medications  Medication Sig Dispense Refill   clopidogrel  (PLAVIX ) 75 MG tablet Take 75 mg by mouth daily.     folic acid (FOLVITE) 1 MG tablet Take 50 mg by mouth daily.     losartan -hydrochlorothiazide  (HYZAAR) 100-25 MG tablet Take 1 tablet by mouth daily. 30 tablet 0   metoprolol succinate (TOPROL-XL) 50 MG 24  hr tablet Take 50 mg by mouth every morning.     ondansetron  (ZOFRAN -ODT) 4 MG disintegrating tablet Take 1 tablet (4 mg total) by mouth every 8 (eight) hours as needed. 20 tablet 0   potassium chloride  SA (KLOR-CON  M) 20 MEQ tablet Take 1 tablet (20 mEq total) by mouth daily for 10 days. 10 tablet 0   predniSONE  (DELTASONE ) 5 MG tablet Take 5 mg by mouth daily.     tacrolimus (PROTOPIC) 0.1 % ointment Apply 1 Application topically 2 (two) times daily.     thiamine  (VITAMIN B-1) 50 MG tablet Take 25 mg by mouth daily.     No current facility-administered medications for this visit.   Facility-Administered Medications Ordered in Other  Visits  Medication Dose Route Frequency Provider Last Rate Last Admin   0.9 %  sodium chloride  infusion   Intravenous Continuous Onesimo Emaline Brink, MD 10 mL/hr at 11/30/23 1140 New Bag at 11/30/23 1140    PHYSICAL EXAMINATION: ECOG PERFORMANCE STATUS: 1 - Symptomatic but completely ambulatory VITALS: Vitals:   11/30/23 1050 11/30/23 1109  BP: (!) 166/96 (!) 159/94  Pulse: 100   Resp: 18   Temp: 98.1 F (36.7 C)   SpO2: 98%    Filed Weights   11/30/23 1050  Weight: 173 lb (78.5 kg)   Body mass index is 30.65 kg/m.  GENERAL: alert, in no acute distress and comfortable SKIN: no acute rashes, no significant lesions EYES: conjunctiva are pink and non-injected, sclera anicteric OROPHARYNX: MMM, no exudates, no oropharyngeal erythema or ulceration NECK: supple, no JVD LYMPH:  no palpable lymphadenopathy in the cervical, axillary or inguinal regions LUNGS: clear to auscultation b/l with normal respiratory effort HEART: regular rate & rhythm ABDOMEN:  normoactive bowel sounds , non tender, not distended, no hepatosplenomegaly Extremity: no pedal edema PSYCH: alert & oriented x 3 with fluent speech NEURO: no focal motor/sensory deficits  LABORATORY DATA:   I have reviewed the data as listed     Latest Ref Rng & Units 11/30/2023   10:30 AM  10/13/2023   10:56 AM 10/04/2023   12:07 PM  CBC EXTENDED  WBC 4.0 - 10.5 K/uL 2.4  2.1  2.1   RBC 3.87 - 5.11 MIL/uL 4.42  3.75  3.97   Hemoglobin 12.0 - 15.0 g/dL 86.8  88.4  87.9   HCT 36.0 - 46.0 % 38.9  34.2  36.3   Platelets 150 - 400 K/uL 163  181  175   NEUT# 1.7 - 7.7 K/uL 1.1  0.7  0.8   Lymph# 0.7 - 4.0 K/uL 0.8  0.8  0.9        Latest Ref Rng & Units 11/30/2023   10:30 AM 10/13/2023   10:56 AM 10/04/2023   12:07 PM  CMP  Glucose 70 - 99 mg/dL 896  880  857   BUN 8 - 23 mg/dL 11  9  12    Creatinine 0.44 - 1.00 mg/dL 9.18  9.20  9.14   Sodium 135 - 145 mmol/L 142  142  139   Potassium 3.5 - 5.1 mmol/L 3.5  3.6  3.7   Chloride 98 - 111 mmol/L 108  106  104   CO2 22 - 32 mmol/L 24  24  30    Calcium  8.9 - 10.3 mg/dL 9.4  9.5  9.1   Total Protein 6.5 - 8.1 g/dL 6.9  6.6  6.7   Total Bilirubin 0.0 - 1.2 mg/dL 0.7  0.6  0.6   Alkaline Phos 38 - 126 U/L 109  108  105   AST 15 - 41 U/L 27  18  20    ALT 0 - 44 U/L 15  10  13       RADIOGRAPHIC STUDIES: I have personally reviewed the radiological images as listed and agreed with the findings in the report. CUP PACEART REMOTE DEVICE CHECK Result Date: 11/30/2023 ILR summary report received. Battery status OK. Normal device function. No new symptom, tachy, brady, or pause episodes. No new AF episodes. Monthly summary reports and ROV/PRN LA, CVRS  CUP PACEART REMOTE DEVICE CHECK Result Date: 10/30/2023 ILR summary report received. Battery status OK. Normal device function. No new symptom, tachy, brady, or pause episodes. No new AF  episodes. Monthly summary reports and ROV/PRN - CS, CVRS  DG Chest 2 View Result Date: 10/13/2023 CLINICAL DATA:  Nausea. EXAM: CHEST - 2 VIEW COMPARISON:  06/30/2023. FINDINGS: The heart size and mediastinal contours are within normal limits. No focal consolidation, pleural effusion, or pneumothorax. No acute osseous abnormality. IMPRESSION: No acute cardiopulmonary findings. Electronically Signed    By: Harrietta Sherry M.D.   On: 10/13/2023 11:53   CT Renal Stone Study Result Date: 10/13/2023 CLINICAL DATA:  Nausea since cardiac ablation on October 2nd. Intermittent chills and body aches. Abdominal/flank pain. EXAM: CT ABDOMEN AND PELVIS WITHOUT CONTRAST TECHNIQUE: Multidetector CT imaging of the abdomen and pelvis was performed following the standard protocol without IV contrast. RADIATION DOSE REDUCTION: This exam was performed according to the departmental dose-optimization program which includes automated exposure control, adjustment of the mA and/or kV according to patient size and/or use of iterative reconstruction technique. COMPARISON:  10/05/2023. FINDINGS: Lower chest: Lung bases are clear. Heart is at the upper limits of normal in size. No pericardial effusion. Trace bilateral pleural effusions. Distal esophagus is grossly unremarkable. Hepatobiliary: Liver and gallbladder are unremarkable. No biliary ductal dilatation. Pancreas: Negative. Spleen: Negative. Adrenals/Urinary Tract: Small right renal stone. Probable renal sinus cysts bilaterally. No specific follow-up necessary. Kidneys are otherwise unremarkable. Ureters are decompressed. Bladder is somewhat low in volume and fairly obscured by streak artifact from a left hip arthroplasty. Stomach/Bowel: Stomach, small bowel, appendix and colon are unremarkable. Vascular/Lymphatic: Atherosclerotic calcification of the aorta. No pathologically enlarged lymph nodes. Reproductive: Hysterectomy.  No adnexal mass. Other: Mild fluid and edema in the right groin, likely related to vascular access for cardiac ablation 10/07/2023. No free fluid. Mesenteries and peritoneum are unremarkable. Musculoskeletal: Left hip arthroplasty. Osteopenia. Degenerative changes in the spine. IMPRESSION: 1. No acute findings to explain the patient's clinical history. 2. Trace bilateral pleural effusions. 3. Small right renal stone. 4. Mild edema and fluid in the right  groin, likely related to vascular access for cardiac ablation 10/07/2023. 5.  Aortic atherosclerosis (ICD10-I70.0). Electronically Signed   By: Newell Eke M.D.   On: 10/13/2023 11:48   CT ABDOMEN PELVIS WO CONTRAST Result Date: 10/09/2023 EXAM: CT ABDOMEN AND PELVIS WITHOUT CONTRAST 10/05/2023 11:37:00 AM TECHNIQUE: CT of the abdomen and pelvis was performed without the administration of intravenous contrast. Multiplanar reformatted images are provided for review. Automated exposure control, iterative reconstruction, and/or weight-based adjustment of the mA/kV was utilized to reduce the radiation dose to as low as reasonably achievable. COMPARISON: None available. CLINICAL HISTORY: Bilateral pelvic and back pain x 3 weeks; Hx of stones x 8; Sx- hyster, hip; No cancer FINDINGS: LOWER CHEST: No acute abnormality. LIVER: The liver is unremarkable. GALLBLADDER AND BILE DUCTS: Gallbladder is unremarkable. No biliary ductal dilatation. SPLEEN: No acute abnormality. PANCREAS: No acute abnormality. ADRENAL GLANDS: No acute abnormality. KIDNEYS, URETERS AND BLADDER: 5 mm nonobstructing calculus noted within the interpolar region of the right kidney. No hydronephrosis. No perinephric or periureteral stranding. Urinary bladder is unremarkable. GI AND BOWEL: Redundant sigmoid colon. The stomach, small bowel, and large bowel are otherwise unremarkable. Appendix normal. PERITONEUM AND RETROPERITONEUM: No ascites. No free air. VASCULATURE: Aorta is normal in caliber. LYMPH NODES: No lymphadenopathy. REPRODUCTIVE ORGANS: Uterus absent. No adnexal masses. BONES AND SOFT TISSUES: Status post left total hip arthroplasty. Intraosseous hemangioma within the L3 vertebral body. Osseous structures are age appropriate. No acute osseous abnormality. No lytic or blastic bone lesion. IMPRESSION: 1. No acute findings. 2. 5 mm nonobstructing calculus in  the interpolar region of the right kidney. Electronically signed by: Dorethia Molt MD  10/09/2023 01:14 AM EDT RP Workstation: [id number]   EP STUDY Result Date: 10/07/2023 SURGEON: Eulas FORBES Furbish, MD PREPROCEDURE DIAGNOSIS: SVT POSTPROCEDURE DIAGNOSIS: No inducible arrhythmias PROCEDURES: 1. Comprehensive EP study. 2. Coronary sinus pacing and recording. 3.Isuprel  infusion for arrhythmia induction INTRODUCTION: TIONNE CARELLI is a 68 y.o. female with a history of symptomatic recurrent SVT who presents today for EP study and radiofrequency ablation. DESCRIPTION OF PROCEDURE: Informed written consent was obtained and the patient was brought to the Electrophysiology Lab in the fasting state. The patient was adequately sedated with intravenous medication as outlined in the anesthesia report. The patient was prepped and draped in the usual sterile fashion. The inguinal areas were anaesthetized using lidocaine . Using ultraound guidance and modified seldinger technique the right femoral vein was accessed x 3. One 6-French, one 8 French and one 9-French sheaths were placed into the right common femoral vein. A Deca polar catheter was advanced to the RA and an electroanatomic map was created of the RA/RV and CS. This catheter was then positioned in the coronary sinus for atrial pacing and recording. A Josephson catheter was then advanced to the RV for pacing and recording.  Unfortunately, an ice catheter was pulled for the case, so I did not advance the ice catheter to the right atrium to guide catheter placement.  Presenting Measurements: The presenting rhythm was sinus rhythm. The PR interval was 112 msec with a QRS of and a QT of 412 msec. The average RR interval was 694 msec. The AH interval was 65 msec and the HV interval was .  There was no pre-excitation on surface ECG. EP study: Ventricular pacing was performed which reveals VA dissociation. Decremental atrial pacing revealed a WCL of 380 ms. There was no evidence of antegrade dual AV nodal physiology with decremental  atrial pacing. Atrial estrastimulus testing was then performed revealing normal AV conduction. AVN ERP was < 600:350 ms. atrial ERP was 600:350 ms.  We did not observe any evidence of dual AV nodal pathway We attempted rapid burst pacing. Isuprel  was then started at 2 mcg/min with an observed effect on the sinus CL. Repeat atrial stim was performed.  I was unable to induce tachycardia or demonstrate any evidence of tachycardia substrate. The procedure was considered complete. All catheters were removed from the body. Sheaths were removed and hemostasis achieved with Mynx devices and manual pressure. EBL<50ml. There were no early apparent complications. CONCLUSIONS: 1. Sinus rhythm upon presentation. 2. No arrhythmia inducible at EP study 3. AV conduction appears normal. There was no VA conduction 4. No early apparent complications. Eulas FORBES Furbish, MD 10/07/2023 5:34 PM    CUP PACEART REMOTE DEVICE CHECK Result Date: 09/27/2023 ILR summary report received. Battery status OK. Normal device function. No new symptom, tachy, brady, or pause episodes. No new AF episodes. Presenting EGM c/w ST at 115 bpm with PVC. Monthly summary reports and ROV/PRN. [facility], [facility]   ASSESSMENT & PLAN:   68 y.o. female with   1.  H/o Relapsing ITP due to lupus -Likely related to Immune thrombocytopenia in the setting of lupus. Patient was treated for her relapsed ITP with 4 doses of Rituxan  again.   2. Lupus -being followed by Dr.Riffe   3. H/o AVN -  related to steroids s/p left THA.   4. Lupus anticoagulant positive-history of old stroke but that was related to vertebral artery dissection. -Reasonable to consider baby  aspirin  81 mg p.o. daily.  Will defer this to her primary care physician.   5.  Leukopenia/neutropenia -likely autoimmune related to lupus.    PLAN: - Discussed lab results on 11/30/2023 in detail with patient: -Platelet counts are steady 163k -Hemoglobin normal at 13.1 -WBC is stable -no new  notable toxicities from her Rituximab . -continue her current rituximab  2 infusions every 6 months with the same supportive medications. -Follow-up in 6 months with treatment in 2 weeks  FOLLOW-UP Please Schedule next cycle of Ruxience  per integrated scheduling in 6 months MD visit in 6 months with next cycle of Ruxience    The total time spent in the appointment was 32 minutes* .  All of the patient's questions were answered and the patient knows to call the clinic with any problems, questions, or concerns.  Emaline Saran MD MS AAHIVMS [facility] Hematology/Oncology Physician [facility]  *Total Encounter Time as defined by the Centers for Medicare and Medicaid Services includes, in addition to the face-to-face time of a patient visit (documented in the note above) non-face-to-face time: obtaining and reviewing outside history, ordering and reviewing medications, tests or procedures, care coordination (communications with other health care professionals or caregivers) and documentation in the medical record.  I, Alan Blowers, acting as a neurosurgeon for Emaline Saran, MD.,have documented all relevant documentation on the behalf of Emaline Saran, MD,as directed by  Emaline Saran, MD while in the presence of Emaline Saran, MD.  I have reviewed the above documentation for accuracy and completeness, and I agree with the above.  Cassidey Barrales, MD

## 2023-11-30 NOTE — Progress Notes (Signed)
 Remote Loop Recorder Transmission

## 2023-11-30 NOTE — Patient Instructions (Signed)
 [facility] - A DEPT OF [facility]  Discharge Instructions: Thank you for choosing [facility] to provide your oncology and hematology care.   If you have a lab appointment with the Cancer Center, please go directly to the Cancer Center and check in at the registration area.   Wear comfortable clothing and clothing appropriate for easy access to any Portacath or PICC line.   We strive to give you quality time with your provider. You may need to reschedule your appointment if you arrive late (15 or more minutes).  Arriving late affects you and other patients whose appointments are after yours.  Also, if you miss three or more appointments without notifying the office, you may be dismissed from the clinic at the provider's discretion.      For prescription refill requests, have your pharmacy contact our office and allow 72 hours for refills to be completed.    Today you received the following chemotherapy and/or immunotherapy agents rituximab        To help prevent nausea and vomiting after your treatment, we encourage you to take your nausea medication as directed.  BELOW ARE SYMPTOMS THAT SHOULD BE REPORTED IMMEDIATELY: *FEVER GREATER THAN 100.4 F (38 C) OR HIGHER *CHILLS OR SWEATING *NAUSEA AND VOMITING THAT IS NOT CONTROLLED WITH YOUR NAUSEA MEDICATION *UNUSUAL SHORTNESS OF BREATH *UNUSUAL BRUISING OR BLEEDING *URINARY PROBLEMS (pain or burning when urinating, or frequent urination) *BOWEL PROBLEMS (unusual diarrhea, constipation, pain near the anus) TENDERNESS IN MOUTH AND THROAT WITH OR WITHOUT PRESENCE OF ULCERS (sore throat, sores in mouth, or a toothache) UNUSUAL RASH, SWELLING OR PAIN  UNUSUAL VAGINAL DISCHARGE OR ITCHING   Items with * indicate a potential emergency and should be followed up as soon as possible or go to the Emergency Department if any problems should occur.  Please show the CHEMOTHERAPY ALERT CARD or IMMUNOTHERAPY  ALERT CARD at check-in to the Emergency Department and triage nurse.  Should you have questions after your visit or need to cancel or reschedule your appointment, please contact [facility] - A DEPT OF [facility]  Dept: [phone]  and follow the prompts.  Office hours are 8:00 a.m. to 4:30 p.m. Monday - Friday. Please note that voicemails left after 4:00 p.m. may not be returned until the following business day.  We are closed weekends and major holidays. You have access to a nurse at all times for urgent questions. Please call the main number to the clinic Dept: [phone] and follow the prompts.   For any non-urgent questions, you may also contact your provider using MyChart. We now offer e-Visits for anyone 77 and older to request care online for non-urgent symptoms. For details visit [email or web address].   Also download the MyChart app! Go to the app store, search MyChart, open the app, select [facility], and log in with your MyChart username and password.

## 2023-12-07 ENCOUNTER — Encounter: Payer: Self-pay | Admitting: Hematology

## 2023-12-07 ENCOUNTER — Ambulatory Visit: Payer: Self-pay | Admitting: Cardiovascular Disease

## 2023-12-10 ENCOUNTER — Ambulatory Visit

## 2023-12-10 ENCOUNTER — Other Ambulatory Visit

## 2023-12-16 ENCOUNTER — Other Ambulatory Visit: Payer: Self-pay

## 2023-12-16 DIAGNOSIS — D693 Immune thrombocytopenic purpura: Secondary | ICD-10-CM

## 2023-12-16 NOTE — Progress Notes (Unsigned)
 HEMATOLOGY/ONCOLOGY CLINIC VISIT NOTE  Date of Service: 12/17/2023   Patient Care Team: Elaine Reynolds, NP as PCP - General Mealor, Eulas BRAVO, MD as PCP - Electrophysiology (Cardiology) Onesimo Emaline Brink, MD as Consulting Physician (Hematology)  REFERRING PHYSICIAN: Onesimo Emaline Brink, MD  CHIEF COMPLAINTS/PURPOSE OF CONSULTATION:  Follow-up for continued evaluation and management of ITP related to SLE  INTERVAL HISTORY  Elaine Allen is a 68 y.o. female here for continued evaluation and management of ITP related to SLE. Patient was last seen by Dr. Onesimo on 11/30/2022. He presents today for Cycle 3, Day 15 of Rituxan .  She reports her energy levels are fairly stable.  She has some days where she has more fatigue and others.  She continues to complete her daily activities on her own.  She reports her appetite is improved since prior with improved weight gain.  She is starting to incorporate protein shakes into her diet.  She denies nausea, vomiting or bowel habit changes.  She denies easy bruising or signs of active bleeding.  She reports having intermittent episodes of right flank pain.  She was found to have a small kidney stone but is unsure what the underlying causes.  She denies fevers, chills, night sweats, shortness of breath, chest pain or cough.  MEDICAL HISTORY:  Past Medical History:  Diagnosis Date   Arthritis    Blind right eye    sees colors   Chronic anemia    Detached retina, right    corrected good for about 6 months sees peripheral colors only   History of kidney stones    Hypertension    Hyperthyroidism    Lupus    Pneumonia    Retinal detachment    Spasmodic dysphonia    Stroke (HCC) 2015   no permanant defecits had weakness in left arm in hospital Had PT also   Vertigo    Hx of preceding stroke     SURGICAL HISTORY: Past Surgical History:  Procedure Laterality Date   ABDOMINAL HYSTERECTOMY     BREAST EXCISIONAL BIOPSY Left     ELECTROPHYSIOLOGY STUDY N/A 10/07/2023   Procedure: ELECTROPHYSIOLOGY STUDY;  Surgeon: Nancey Eulas BRAVO, MD;  Location: [facility];  Service: Cardiovascular;  Laterality: N/A;   KNEE SURGERY Left    THYROID  SURGERY     TOTAL HIP ARTHROPLASTY Left 06/27/2018   Procedure: TOTAL HIP ARTHROPLASTY ANTERIOR APPROACH;  Surgeon: Liam Lerner, MD;  Location: [facility];  Service: Orthopedics;  Laterality: Left;     SOCIAL HISTORY: Social History   Socioeconomic History   Marital status: Single    Spouse name: Not on file   Number of children: 2   Years of education: college   Highest education level: Not on file  Occupational History   Occupation: [employer]  Tobacco Use   Smoking status: Never    Passive exposure: Never   Smokeless tobacco: Never  Vaping Use   Vaping status: Never Used  Substance and Sexual Activity   Alcohol use: No   Drug use: No   Sexual activity: Not Currently  Other Topics Concern   Not on file  Social History Narrative   Not on file   Social Drivers of Health   Tobacco Use: Low Risk (10/13/2023)   Patient History    Smoking Tobacco Use: Never    Smokeless Tobacco Use: Never    Passive Exposure: Never  Financial Resource Strain: Not on file  Food Insecurity: No Food Insecurity (06/30/2023)  Epic    Worried About Programme Researcher, Broadcasting/film/video in the Last Year: Never true    [employer] of Food in the Last Year: Never true  Transportation Needs: No Transportation Needs (06/30/2023)   Epic    Lack of Transportation (Medical): No    Lack of Transportation (Non-Medical): No  Physical Activity: Not on file  Stress: Not on file  Social Connections: Moderately Integrated (06/30/2023)   Social Connection and Isolation Panel    Frequency of Communication with Friends and Family: More than three times a week    Frequency of Social Gatherings with Friends and Family: More than three times a week    Attends Religious Services: 1 to 4 times per year    Active Member of  [employer] or Organizations: No    Attends Banker Meetings: 1 to 4 times per year    Marital Status: Never married  Intimate Partner Violence: Not At Risk (06/30/2023)   Epic    Fear of Current or Ex-Partner: No    Emotionally Abused: No    Physically Abused: No    Sexually Abused: No  Depression (PHQ2-9): Not on file  Alcohol Screen: Not on file  Housing: Low Risk (06/30/2023)   Epic    Unable to Pay for Housing in the Last Year: No    Number of Times Moved in the Last Year: 0    Homeless in the Last Year: No  Utilities: Not At Risk (06/30/2023)   Epic    Threatened with loss of utilities: No  Health Literacy: Not on file     FAMILY HISTORY: Family History  Problem Relation Age of Onset   Cancer Mother        liver   Diabetes Mother    Arthritis Mother    Cancer Father        prostate,METS, stomach    Ovarian cancer Sister    Throat cancer Brother      ALLERGIES:   is allergic to plaquenil [hydroxychloroquine sulfate], amoxicillin , azithromycin , lactose intolerance (gi), penicillin g, and hydroxychloroquine sulfate.   MEDICATIONS:  Current Outpatient Medications  Medication Sig Dispense Refill   clopidogrel  (PLAVIX ) 75 MG tablet Take 75 mg by mouth daily.     folic acid (FOLVITE) 1 MG tablet Take 50 mg by mouth daily.     losartan -hydrochlorothiazide  (HYZAAR) 100-25 MG tablet Take 1 tablet by mouth daily. 30 tablet 0   metoprolol succinate (TOPROL-XL) 50 MG 24 hr tablet Take 50 mg by mouth every morning.     ondansetron  (ZOFRAN -ODT) 4 MG disintegrating tablet Take 1 tablet (4 mg total) by mouth every 8 (eight) hours as needed. 20 tablet 0   potassium chloride  SA (KLOR-CON  M) 20 MEQ tablet Take 1 tablet (20 mEq total) by mouth daily for 10 days. 10 tablet 0   predniSONE  (DELTASONE ) 5 MG tablet Take 5 mg by mouth daily.     tacrolimus (PROTOPIC) 0.1 % ointment Apply 1 Application topically 2 (two) times daily.     thiamine  (VITAMIN B-1) 50 MG tablet Take 25 mg  by mouth daily.     No current facility-administered medications for this visit.   Facility-Administered Medications Ordered in Other Visits  Medication Dose Route Frequency Provider Last Rate Last Admin   0.9 %  sodium chloride  infusion   Intravenous Continuous Kale, Gautam Kishore, MD   Stopped at 12/17/23 1603    REVIEW OF SYSTEMS:    10 Point review of Systems was done is  negative except as noted above.   PHYSICAL EXAMINATION: VS stable GENERAL:alert, in no acute distress and comfortable SKIN: no acute rashes, no significant lesions EYES: conjunctiva are pink and non-injected, sclera anicteric LUNGS: clear to auscultation b/l with normal respiratory effort HEART: regular rate & rhythm ABDOMEN: Mild right sided CVA tenderness Extremity: no pedal edema PSYCH: alert & oriented x 3 with fluent speech NEURO: no focal motor/sensory deficits   LABORATORY DATA:  I have reviewed the data as listed     Latest Ref Rng & Units 12/17/2023   10:46 AM 11/30/2023   10:30 AM 10/13/2023   10:56 AM  CBC  WBC 4.0 - 10.5 K/uL 2.2  2.4  2.1   Hemoglobin 12.0 - 15.0 g/dL 87.1  86.8  88.4   Hematocrit 36.0 - 46.0 % 38.6  38.9  34.2   Platelets 150 - 400 K/uL 172  163  181   ANC 1300     Latest Ref Rng & Units 12/17/2023   10:46 AM 11/30/2023   10:30 AM 10/13/2023   10:56 AM  CMP  Glucose 70 - 99 mg/dL 94  896  880   BUN 8 - 23 mg/dL 14  11  9    Creatinine 0.44 - 1.00 mg/dL 9.09  9.18  9.20   Sodium 135 - 145 mmol/L 140  142  142   Potassium 3.5 - 5.1 mmol/L 3.9  3.5  3.6   Chloride 98 - 111 mmol/L 105  108  106   CO2 22 - 32 mmol/L 27  24  24    Calcium  8.9 - 10.3 mg/dL 9.4  9.4  9.5   Total Protein 6.5 - 8.1 g/dL 6.9  6.9  6.6   Total Bilirubin 0.0 - 1.2 mg/dL 0.5  0.7  0.6   Alkaline Phos 38 - 126 U/L 95  109  108   AST 15 - 41 U/L 22  27  18    ALT 0 - 44 U/L 13  15  10     RADIOGRAPHIC STUDIES: I have personally reviewed the radiological images as listed and agreed with the  findings in the report. CUP PACEART REMOTE DEVICE CHECK Result Date: 11/30/2023 ILR summary report received. Battery status OK. Normal device function. No new symptom, tachy, brady, or pause episodes. No new AF episodes. Monthly summary reports and ROV/PRN LA, CVRS   ASSESSMENT & PLAN:   Elaine Allen is a 68 y.o. female with:  1.  H/o Relapsing ITP due to lupus -Likely related to Immune thrombocytopenia in the setting of lupus. -Patient was treated for her relapsed ITP with 4 doses of Rituxan  from 03/31/2019-04/21/2019, 07/26/2020-08/19/2020. -Due to relapsed ITP, restarted Rituxan  therapy weekly x 4 cycles on 03/27/2022.   2. Lupus -under the care of of rheumatology at [facility]  3. H/o AVN -  related to steroids s/p left THA.  4. Lupus anticoagulant positive-history of old stroke but that was related to vertebral artery dissection. -Reasonable to consider baby aspirin  81 mg p.o. daily.  Will defer this to her primary care physician.  5.  Leukopenia/neutropenia -likely autoimmune related to lupus.   Plan: -Due for Cycle 3, Day 15 of Rituxan  today -Labs from today were reviewed and require no intervention.  WBC 2.2, hemoglobin 12.8, platelets 172, creatinine and LFTs are normal. -Patient tolerating Rituxan  infusion, proceed without any dose modifications.  -Advised to follow-up with PCP regarding possible kidney stone, encouraged to stay hydrated and monitor for any urinary symptoms.  FOLLOW  UP: Per integrative schedule with Ritux every 6 months on days 1 and 15.    All of the patient's questions were answered with apparent satisfaction. The patient knows to call the clinic with any problems, questions or concerns.  I have spent a total of 25 minutes minutes of face-to-face and non-face-to-face time, preparing to see the patient,performing a medically appropriate examination, counseling and educating the patient, documenting clinical information in the electronic health  record, and care coordination.   Johnston Police PA-C Dept of Hematology and Oncology [facility] Phone: [phone]

## 2023-12-17 ENCOUNTER — Inpatient Hospital Stay: Admitting: Physician Assistant

## 2023-12-17 ENCOUNTER — Inpatient Hospital Stay: Attending: Hematology

## 2023-12-17 ENCOUNTER — Inpatient Hospital Stay

## 2023-12-17 VITALS — BP 151/84 | HR 91 | Temp 97.5°F | Resp 18 | Wt 171.8 lb

## 2023-12-17 DIAGNOSIS — Z8673 Personal history of transient ischemic attack (TIA), and cerebral infarction without residual deficits: Secondary | ICD-10-CM | POA: Insufficient documentation

## 2023-12-17 DIAGNOSIS — D693 Immune thrombocytopenic purpura: Secondary | ICD-10-CM

## 2023-12-17 DIAGNOSIS — Z8041 Family history of malignant neoplasm of ovary: Secondary | ICD-10-CM | POA: Diagnosis not present

## 2023-12-17 DIAGNOSIS — Z5112 Encounter for antineoplastic immunotherapy: Secondary | ICD-10-CM | POA: Insufficient documentation

## 2023-12-17 DIAGNOSIS — Z808 Family history of malignant neoplasm of other organs or systems: Secondary | ICD-10-CM | POA: Insufficient documentation

## 2023-12-17 DIAGNOSIS — D6862 Lupus anticoagulant syndrome: Secondary | ICD-10-CM | POA: Diagnosis not present

## 2023-12-17 DIAGNOSIS — Z7902 Long term (current) use of antithrombotics/antiplatelets: Secondary | ICD-10-CM | POA: Insufficient documentation

## 2023-12-17 DIAGNOSIS — Z79899 Other long term (current) drug therapy: Secondary | ICD-10-CM | POA: Insufficient documentation

## 2023-12-17 DIAGNOSIS — Z79621 Long term (current) use of calcineurin inhibitor: Secondary | ICD-10-CM | POA: Diagnosis not present

## 2023-12-17 DIAGNOSIS — Z7952 Long term (current) use of systemic steroids: Secondary | ICD-10-CM | POA: Diagnosis not present

## 2023-12-17 LAB — CMP (CANCER CENTER ONLY)
ALT: 13 U/L (ref 0–44)
AST: 22 U/L (ref 15–41)
Albumin: 4.3 g/dL (ref 3.5–5.0)
Alkaline Phosphatase: 95 U/L (ref 38–126)
Anion gap: 8 (ref 5–15)
BUN: 14 mg/dL (ref 8–23)
CO2: 27 mmol/L (ref 22–32)
Calcium: 9.4 mg/dL (ref 8.9–10.3)
Chloride: 105 mmol/L (ref 98–111)
Creatinine: 0.9 mg/dL (ref 0.44–1.00)
GFR, Estimated: >60 mL/min (ref >60)
Glucose, Bld: 94 mg/dL (ref 70–99)
Potassium: 3.9 mmol/L (ref 3.5–5.1)
Sodium: 140 mmol/L (ref 135–145)
Total Bilirubin: 0.5 mg/dL (ref 0.0–1.2)
Total Protein: 6.9 g/dL (ref 6.5–8.1)

## 2023-12-17 LAB — CBC WITH DIFFERENTIAL (CANCER CENTER ONLY)
Abs Immature Granulocytes: 0 K/uL (ref 0.00–0.07)
Basophils Absolute: 0 K/uL (ref 0.0–0.1)
Basophils Relative: 1 %
Eosinophils Absolute: 0 K/uL (ref 0.0–0.5)
Eosinophils Relative: 2 %
HCT: 38.6 % (ref 36.0–46.0)
Hemoglobin: 12.8 g/dL (ref 12.0–15.0)
Immature Granulocytes: 0 %
Lymphocytes Relative: 34 %
Lymphs Abs: 0.7 K/uL (ref 0.7–4.0)
MCH: 29.7 pg (ref 26.0–34.0)
MCHC: 33.2 g/dL (ref 30.0–36.0)
MCV: 89.6 fL (ref 80.0–100.0)
Monocytes Absolute: 0.4 K/uL (ref 0.1–1.0)
Monocytes Relative: 18 %
Neutro Abs: 1 K/uL — ABNORMAL LOW (ref 1.7–7.7)
Neutrophils Relative %: 45 %
Platelet Count: 172 K/uL (ref 150–400)
RBC: 4.31 MIL/uL (ref 3.87–5.11)
RDW: 14.4 % (ref 11.5–15.5)
WBC Count: 2.2 K/uL — ABNORMAL LOW (ref 4.0–10.5)
nRBC: 0 % (ref 0.0–0.2)

## 2023-12-17 MED ORDER — METHYLPREDNISOLONE SODIUM SUCC 125 MG IJ SOLR
125.0000 mg | Freq: Once | INTRAMUSCULAR | Status: AC
  Administered 2023-12-17: 125 mg via INTRAVENOUS
  Filled 2023-12-17: qty 2

## 2023-12-17 MED ORDER — SODIUM CHLORIDE 0.9 % IV SOLN: INTRAVENOUS | Status: DC

## 2023-12-17 MED ORDER — DIPHENHYDRAMINE HCL 25 MG PO CAPS
50.0000 mg | ORAL_CAPSULE | Freq: Once | ORAL | Status: AC
  Administered 2023-12-17: 50 mg via ORAL
  Filled 2023-12-17: qty 2

## 2023-12-17 MED ORDER — SODIUM CHLORIDE 0.9 % IV SOLN
1000.0000 mg | Freq: Once | INTRAVENOUS | Status: AC
  Administered 2023-12-17: 1000 mg via INTRAVENOUS
  Filled 2023-12-17: qty 100

## 2023-12-17 MED ORDER — FAMOTIDINE IN NACL 20-0.9 MG/50ML-% IV SOLN
20.0000 mg | Freq: Once | INTRAVENOUS | Status: AC
  Administered 2023-12-17: 20 mg via INTRAVENOUS
  Filled 2023-12-17: qty 50

## 2023-12-17 MED ORDER — ACETAMINOPHEN 325 MG PO TABS
650.0000 mg | ORAL_TABLET | Freq: Once | ORAL | Status: AC
  Administered 2023-12-17: 650 mg via ORAL
  Filled 2023-12-17: qty 2

## 2023-12-17 MED ORDER — MONTELUKAST SODIUM 10 MG PO TABS
10.0000 mg | ORAL_TABLET | Freq: Once | ORAL | Status: AC
  Administered 2023-12-17: 10 mg via ORAL
  Filled 2023-12-17: qty 1

## 2023-12-17 NOTE — Patient Instructions (Signed)
 [facility]  Discharge Instructions: Thank you for choosing [facility] to provide your oncology and hematology care.   If you have a lab appointment with the Cancer Center, please go directly to the Cancer Center and check in at the registration area.   Wear comfortable clothing and clothing appropriate for easy access to any Portacath or PICC line.   We strive to give you quality time with your provider. You may need to reschedule your appointment if you arrive late (15 or more minutes).  Arriving late affects you and other patients whose appointments are after yours.  Also, if you miss three or more appointments without notifying the office, you may be dismissed from the clinic at the provider's discretion.      For prescription refill requests, have your pharmacy contact our office and allow 72 hours for refills to be completed.    Today you received the following chemotherapy and/or immunotherapy agents Rituximab      To help prevent nausea and vomiting after your treatment, we encourage you to take your nausea medication as directed.  BELOW ARE SYMPTOMS THAT SHOULD BE REPORTED IMMEDIATELY: *FEVER GREATER THAN 100.4 F (38 C) OR HIGHER *CHILLS OR SWEATING *NAUSEA AND VOMITING THAT IS NOT CONTROLLED WITH YOUR NAUSEA MEDICATION *UNUSUAL SHORTNESS OF BREATH *UNUSUAL BRUISING OR BLEEDING *URINARY PROBLEMS (pain or burning when urinating, or frequent urination) *BOWEL PROBLEMS (unusual diarrhea, constipation, pain near the anus) TENDERNESS IN MOUTH AND THROAT WITH OR WITHOUT PRESENCE OF ULCERS (sore throat, sores in mouth, or a toothache) UNUSUAL RASH, SWELLING OR PAIN  UNUSUAL VAGINAL DISCHARGE OR ITCHING   Items with * indicate a potential emergency and should be followed up as soon as possible or go to the Emergency Department if any problems should occur.  Please show the CHEMOTHERAPY ALERT CARD or IMMUNOTHERAPY  ALERT CARD at check-in to the Emergency Department and triage nurse.  Should you have questions after your visit or need to cancel or reschedule your appointment, please contact [facility]  Dept: [phone]  and follow the prompts.  Office hours are 8:00 a.m. to 4:30 p.m. Monday - Friday. Please note that voicemails left after 4:00 p.m. may not be returned until the following business day.  We are closed weekends and major holidays. You have access to a nurse at all times for urgent questions. Please call the main number to the clinic Dept: [phone] and follow the prompts.   For any non-urgent questions, you may also contact your provider using MyChart. We now offer e-Visits for anyone 78 and older to request care online for non-urgent symptoms. For details visit [email or web address].   Also download the MyChart app! Go to the app store, search "MyChart", open the app, select [place], and log in with your MyChart username and password.

## 2023-12-21 ENCOUNTER — Encounter

## 2023-12-30 ENCOUNTER — Ambulatory Visit

## 2023-12-30 DIAGNOSIS — I4892 Unspecified atrial flutter: Secondary | ICD-10-CM | POA: Diagnosis not present

## 2023-12-31 LAB — CUP PACEART REMOTE DEVICE CHECK
Date Time Interrogation Session: 20251224234347
Implantable Pulse Generator Implant Date: 20250303

## 2023-12-31 NOTE — Progress Notes (Signed)
 Remote Loop Recorder Transmission

## 2024-01-05 ENCOUNTER — Ambulatory Visit: Payer: Self-pay | Admitting: Cardiovascular Disease

## 2024-01-23 ENCOUNTER — Encounter: Payer: Self-pay | Admitting: Hematology

## 2024-01-30 ENCOUNTER — Ambulatory Visit: Attending: Cardiovascular Disease

## 2024-01-30 DIAGNOSIS — I4892 Unspecified atrial flutter: Secondary | ICD-10-CM | POA: Diagnosis not present

## 2024-01-31 LAB — CUP PACEART REMOTE DEVICE CHECK
Date Time Interrogation Session: 20260124235008
Implantable Pulse Generator Implant Date: 20250303

## 2024-02-01 ENCOUNTER — Ambulatory Visit: Payer: Self-pay | Admitting: Cardiovascular Disease

## 2024-02-03 NOTE — Progress Notes (Signed)
 Remote Loop Recorder Transmission

## 2024-03-01 ENCOUNTER — Ambulatory Visit

## 2024-04-01 ENCOUNTER — Ambulatory Visit

## 2024-05-02 ENCOUNTER — Ambulatory Visit

## 2024-05-30 ENCOUNTER — Inpatient Hospital Stay

## 2024-05-30 ENCOUNTER — Inpatient Hospital Stay: Admitting: Hematology
